# Patient Record
Sex: Male | Born: 1945
Health system: Southern US, Community
[De-identification: ages and names within clinical notes are randomized; demographics above are authoritative.]

## PROBLEM LIST (undated history)

## (undated) DIAGNOSIS — F32A Depression, unspecified: Secondary | ICD-10-CM

## (undated) DIAGNOSIS — M4800 Spinal stenosis, site unspecified: Secondary | ICD-10-CM

## (undated) DIAGNOSIS — I10 Essential (primary) hypertension: Secondary | ICD-10-CM

## (undated) DIAGNOSIS — F329 Major depressive disorder, single episode, unspecified: Secondary | ICD-10-CM

## (undated) DIAGNOSIS — R319 Hematuria, unspecified: Secondary | ICD-10-CM

## (undated) DIAGNOSIS — I2111 ST elevation (STEMI) myocardial infarction involving right coronary artery: Secondary | ICD-10-CM

## (undated) DIAGNOSIS — F419 Anxiety disorder, unspecified: Secondary | ICD-10-CM

## (undated) DIAGNOSIS — C801 Malignant (primary) neoplasm, unspecified: Secondary | ICD-10-CM

## (undated) DIAGNOSIS — K219 Gastro-esophageal reflux disease without esophagitis: Secondary | ICD-10-CM

## (undated) DIAGNOSIS — M1990 Unspecified osteoarthritis, unspecified site: Secondary | ICD-10-CM

## (undated) HISTORY — PX: SPINE SURGERY: SHX786

## (undated) HISTORY — DX: Unspecified osteoarthritis, unspecified site: M19.90

## (undated) HISTORY — DX: Essential (primary) hypertension: I10

## (undated) HISTORY — DX: Spinal stenosis, site unspecified: M48.00

## (undated) HISTORY — PX: COLONOSCOPY: SHX174

## (undated) HISTORY — PX: KNEE SURGERY: SHX244

---

## 2001-06-16 HISTORY — PX: COLONOSCOPY: SHX5424

## 2002-01-19 ENCOUNTER — Ambulatory Visit (HOSPITAL_BASED_OUTPATIENT_CLINIC_OR_DEPARTMENT_OTHER): Admission: RE | Admit: 2002-01-19 | Discharge: 2002-01-19 | Payer: Self-pay | Admitting: Urology

## 2004-04-17 ENCOUNTER — Ambulatory Visit: Payer: Self-pay | Admitting: Internal Medicine

## 2004-04-24 ENCOUNTER — Ambulatory Visit: Payer: Self-pay | Admitting: Internal Medicine

## 2005-04-15 ENCOUNTER — Inpatient Hospital Stay (HOSPITAL_COMMUNITY): Admission: RE | Admit: 2005-04-15 | Discharge: 2005-04-18 | Payer: Self-pay | Admitting: Orthopedic Surgery

## 2005-05-09 ENCOUNTER — Ambulatory Visit: Payer: Self-pay | Admitting: Internal Medicine

## 2005-05-26 ENCOUNTER — Ambulatory Visit: Payer: Self-pay | Admitting: Internal Medicine

## 2006-02-14 HISTORY — PX: ACHILLES TENDON SURGERY: SHX542

## 2006-03-10 ENCOUNTER — Ambulatory Visit: Payer: Self-pay | Admitting: Internal Medicine

## 2006-05-26 ENCOUNTER — Ambulatory Visit: Payer: Self-pay | Admitting: Internal Medicine

## 2006-05-26 LAB — CONVERTED CEMR LAB
ALT: 65 units/L — ABNORMAL HIGH (ref 0–40)
AST: 43 units/L — ABNORMAL HIGH (ref 0–37)
Albumin: 3.9 g/dL (ref 3.5–5.2)
Alkaline Phosphatase: 58 units/L (ref 39–117)
BUN: 16 mg/dL (ref 6–23)
Basophils Absolute: 0 10*3/uL (ref 0.0–0.1)
Basophils Relative: 0.4 % (ref 0.0–1.0)
CO2: 29 meq/L (ref 19–32)
Calcium: 9.7 mg/dL (ref 8.4–10.5)
Chloride: 103 meq/L (ref 96–112)
Chol/HDL Ratio, serum: 5.1
Cholesterol: 219 mg/dL (ref 0–200)
Creatinine, Ser: 1.3 mg/dL (ref 0.4–1.5)
Eosinophil percent: 4.4 % (ref 0.0–5.0)
GFR calc non Af Amer: 60 mL/min
Glomerular Filtration Rate, Af Am: 73 mL/min/{1.73_m2}
Glucose, Bld: 98 mg/dL (ref 70–99)
HCT: 53.5 % — ABNORMAL HIGH (ref 39.0–52.0)
HDL: 43.2 mg/dL (ref >39.0)
Hemoglobin: 17.8 g/dL — ABNORMAL HIGH (ref 13.0–17.0)
LDL DIRECT: 164.1 mg/dL
Lymphocytes Relative: 26.1 % (ref 12.0–46.0)
MCHC: 33.3 g/dL (ref 30.0–36.0)
MCV: 91.8 fL (ref 78.0–100.0)
Monocytes Absolute: 0.5 10*3/uL (ref 0.2–0.7)
Monocytes Relative: 9.1 % (ref 3.0–11.0)
Neutro Abs: 3 10*3/uL (ref 1.4–7.7)
Neutrophils Relative %: 60 % (ref 43.0–77.0)
PSA: 4.15 ng/mL — ABNORMAL HIGH (ref 0.10–4.00)
Platelets: 242 10*3/uL (ref 150–400)
Potassium: 4.5 meq/L (ref 3.5–5.1)
RBC: 5.83 M/uL — ABNORMAL HIGH (ref 4.22–5.81)
RDW: 12.8 % (ref 11.5–14.6)
Sodium: 139 meq/L (ref 135–145)
TSH: 2.33 microintl units/mL (ref 0.35–5.50)
Total Bilirubin: 1.5 mg/dL — ABNORMAL HIGH (ref 0.3–1.2)
Total Protein: 6.3 g/dL (ref 6.0–8.3)
Triglyceride fasting, serum: 95 mg/dL (ref 0–149)
VLDL: 19 mg/dL (ref 0–40)
WBC: 5 10*3/uL (ref 4.5–10.5)

## 2006-06-01 ENCOUNTER — Ambulatory Visit: Payer: Self-pay | Admitting: Internal Medicine

## 2007-03-16 ENCOUNTER — Encounter: Admission: RE | Admit: 2007-03-16 | Discharge: 2007-03-16 | Payer: Self-pay | Admitting: Neurosurgery

## 2007-03-31 ENCOUNTER — Ambulatory Visit (HOSPITAL_COMMUNITY): Admission: RE | Admit: 2007-03-31 | Discharge: 2007-04-01 | Payer: Self-pay | Admitting: Neurosurgery

## 2007-05-07 ENCOUNTER — Ambulatory Visit: Payer: Self-pay | Admitting: Internal Medicine

## 2007-05-07 LAB — CONVERTED CEMR LAB
ALT: 21 units/L (ref 0–53)
AST: 21 units/L (ref 0–37)
Albumin: 4 g/dL (ref 3.5–5.2)
Alkaline Phosphatase: 51 units/L (ref 39–117)
BUN: 19 mg/dL (ref 6–23)
Basophils Absolute: 0 10*3/uL (ref 0.0–0.1)
Basophils Relative: 0.3 % (ref 0.0–1.0)
Bilirubin Urine: NEGATIVE
Bilirubin, Direct: 0.2 mg/dL (ref 0.0–0.3)
Blood in Urine, dipstick: NEGATIVE
CO2: 31 meq/L (ref 19–32)
Calcium: 9.7 mg/dL (ref 8.4–10.5)
Chloride: 105 meq/L (ref 96–112)
Cholesterol: 186 mg/dL (ref 0–200)
Creatinine, Ser: 1 mg/dL (ref 0.4–1.5)
Eosinophils Absolute: 0.1 10*3/uL (ref 0.0–0.6)
Eosinophils Relative: 1.9 % (ref 0.0–5.0)
GFR calc Af Amer: 99 mL/min
GFR calc non Af Amer: 82 mL/min
Glucose, Bld: 103 mg/dL — ABNORMAL HIGH (ref 70–99)
Glucose, Urine, Semiquant: NEGATIVE
HCT: 50.4 % (ref 39.0–52.0)
HDL: 35.5 mg/dL — ABNORMAL LOW (ref >39.0)
Hemoglobin: 17.1 g/dL — ABNORMAL HIGH (ref 13.0–17.0)
Ketones, urine, test strip: NEGATIVE
LDL Cholesterol: 133 mg/dL — ABNORMAL HIGH (ref 0–99)
Lymphocytes Relative: 19.8 % (ref 12.0–46.0)
MCHC: 33.8 g/dL (ref 30.0–36.0)
MCV: 90.5 fL (ref 78.0–100.0)
Monocytes Absolute: 0.7 10*3/uL (ref 0.2–0.7)
Monocytes Relative: 10.4 % (ref 3.0–11.0)
Neutro Abs: 4.3 10*3/uL (ref 1.4–7.7)
Neutrophils Relative %: 67.6 % (ref 43.0–77.0)
Nitrite: NEGATIVE
PSA: 3.43 ng/mL (ref 0.10–4.00)
Platelets: 208 10*3/uL (ref 150–400)
Potassium: 4.2 meq/L (ref 3.5–5.1)
Protein, U semiquant: NEGATIVE
RBC: 5.57 M/uL (ref 4.22–5.81)
RDW: 12.6 % (ref 11.5–14.6)
Sodium: 142 meq/L (ref 135–145)
Specific Gravity, Urine: 1.025
TSH: 2.38 microintl units/mL (ref 0.35–5.50)
Total Bilirubin: 1.4 mg/dL — ABNORMAL HIGH (ref 0.3–1.2)
Total CHOL/HDL Ratio: 5.2
Total Protein: 6.4 g/dL (ref 6.0–8.3)
Triglycerides: 87 mg/dL (ref 0–149)
Urobilinogen, UA: 0.2
VLDL: 17 mg/dL (ref 0–40)
WBC Urine, dipstick: NEGATIVE
WBC: 6.3 10*3/uL (ref 4.5–10.5)
pH: 6

## 2007-05-21 DIAGNOSIS — I10 Essential (primary) hypertension: Secondary | ICD-10-CM

## 2007-05-21 DIAGNOSIS — M1990 Unspecified osteoarthritis, unspecified site: Secondary | ICD-10-CM | POA: Insufficient documentation

## 2007-06-11 ENCOUNTER — Ambulatory Visit: Payer: Self-pay | Admitting: Internal Medicine

## 2007-06-11 DIAGNOSIS — M549 Dorsalgia, unspecified: Secondary | ICD-10-CM | POA: Insufficient documentation

## 2007-12-07 IMAGING — CR DG LUMBAR SPINE 2-3V
1 series · 1 of 1 positions shown · non-contrast
Comparison: none

CLINICAL DATA: Lumbar stenosis

LUMBAR SPINE - 2 VIEW

[view not recorded]
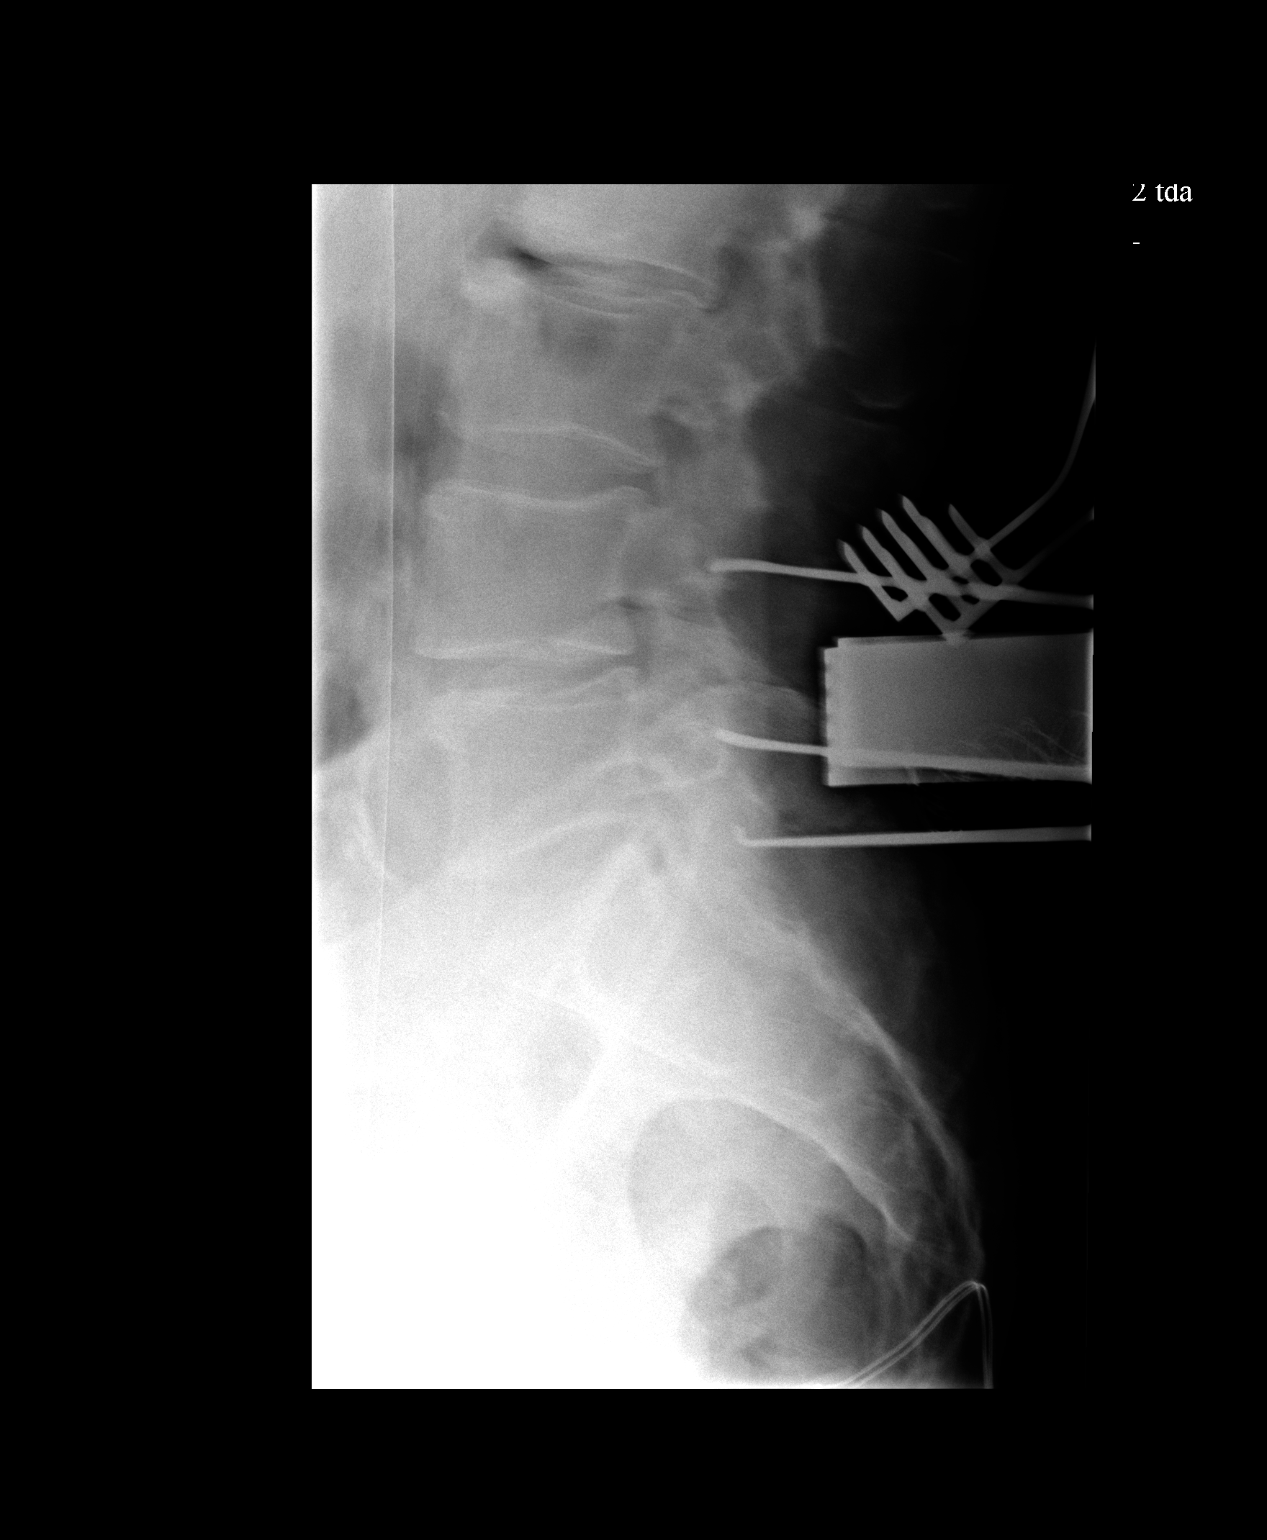

[1 of 1 positions shown; findings below may reference images not displayed]

FINDINGS: First lateral intraoperative film shows localizing needles
posteriorly at the L2-L3 and L4-L5 levels.

Second intraoperative film shows localizing instruments from the pedicles at L4
to the L5-S1 vertebral body.

IMPRESSION

Intraoperative localization as above.

## 2008-07-04 ENCOUNTER — Ambulatory Visit: Payer: Self-pay | Admitting: Internal Medicine

## 2008-07-04 LAB — CONVERTED CEMR LAB
ALT: 20 units/L (ref 0–53)
AST: 24 units/L (ref 0–37)
Albumin: 4 g/dL (ref 3.5–5.2)
Alkaline Phosphatase: 44 units/L (ref 39–117)
BUN: 21 mg/dL (ref 6–23)
Basophils Absolute: 0 10*3/uL (ref 0.0–0.1)
Basophils Relative: 0.7 % (ref 0.0–3.0)
Bilirubin Urine: NEGATIVE
Bilirubin, Direct: 0.1 mg/dL (ref 0.0–0.3)
Blood in Urine, dipstick: NEGATIVE
CO2: 31 meq/L (ref 19–32)
Calcium: 9.5 mg/dL (ref 8.4–10.5)
Chloride: 104 meq/L (ref 96–112)
Cholesterol: 204 mg/dL (ref 0–200)
Creatinine, Ser: 1.2 mg/dL (ref 0.4–1.5)
Direct LDL: 144.2 mg/dL
Eosinophils Absolute: 0.2 10*3/uL (ref 0.0–0.7)
Eosinophils Relative: 4 % (ref 0.0–5.0)
GFR calc Af Amer: 79 mL/min
GFR calc non Af Amer: 66 mL/min
Glucose, Bld: 96 mg/dL (ref 70–99)
Glucose, Urine, Semiquant: NEGATIVE
HCT: 49 % (ref 39.0–52.0)
HDL: 41 mg/dL (ref >39.0)
Hemoglobin: 17 g/dL (ref 13.0–17.0)
Lymphocytes Relative: 25.3 % (ref 12.0–46.0)
MCHC: 34.6 g/dL (ref 30.0–36.0)
MCV: 90.3 fL (ref 78.0–100.0)
Monocytes Absolute: 0.5 10*3/uL (ref 0.1–1.0)
Monocytes Relative: 9.5 % (ref 3.0–12.0)
Neutro Abs: 3.3 10*3/uL (ref 1.4–7.7)
Neutrophils Relative %: 60.5 % (ref 43.0–77.0)
Nitrite: NEGATIVE
PSA: 3.71 ng/mL (ref 0.10–4.00)
Platelets: 198 10*3/uL (ref 150–400)
Potassium: 4.1 meq/L (ref 3.5–5.1)
Protein, U semiquant: NEGATIVE
RBC: 5.43 M/uL (ref 4.22–5.81)
RDW: 12.9 % (ref 11.5–14.6)
Sodium: 142 meq/L (ref 135–145)
Specific Gravity, Urine: 1.025
TSH: 2.97 microintl units/mL (ref 0.35–5.50)
Total Bilirubin: 1.5 mg/dL — ABNORMAL HIGH (ref 0.3–1.2)
Total CHOL/HDL Ratio: 5
Total Protein: 6.7 g/dL (ref 6.0–8.3)
Triglycerides: 103 mg/dL (ref 0–149)
Urobilinogen, UA: 0.2
VLDL: 21 mg/dL (ref 0–40)
WBC Urine, dipstick: NEGATIVE
WBC: 5.3 10*3/uL (ref 4.5–10.5)
pH: 5.5

## 2008-07-10 ENCOUNTER — Encounter: Payer: Self-pay | Admitting: Internal Medicine

## 2008-07-11 ENCOUNTER — Ambulatory Visit: Payer: Self-pay | Admitting: Internal Medicine

## 2008-10-22 ENCOUNTER — Encounter: Admission: RE | Admit: 2008-10-22 | Discharge: 2008-10-22 | Payer: Self-pay | Admitting: Neurosurgery

## 2008-10-30 ENCOUNTER — Ambulatory Visit (HOSPITAL_COMMUNITY): Admission: RE | Admit: 2008-10-30 | Discharge: 2008-10-31 | Payer: Self-pay | Admitting: Neurosurgery

## 2009-01-08 ENCOUNTER — Telehealth (INDEPENDENT_AMBULATORY_CARE_PROVIDER_SITE_OTHER): Payer: Self-pay | Admitting: *Deleted

## 2009-07-12 ENCOUNTER — Ambulatory Visit: Payer: Self-pay | Admitting: Internal Medicine

## 2009-07-12 LAB — CONVERTED CEMR LAB
ALT: 20 units/L (ref 0–53)
AST: 23 units/L (ref 0–37)
Albumin: 4.1 g/dL (ref 3.5–5.2)
Alkaline Phosphatase: 49 units/L (ref 39–117)
BUN: 18 mg/dL (ref 6–23)
Basophils Absolute: 0 10*3/uL (ref 0.0–0.1)
Basophils Relative: 0.6 % (ref 0.0–3.0)
Bilirubin Urine: NEGATIVE
Bilirubin, Direct: 0.2 mg/dL (ref 0.0–0.3)
CO2: 29 meq/L (ref 19–32)
Calcium: 9.5 mg/dL (ref 8.4–10.5)
Chloride: 104 meq/L (ref 96–112)
Cholesterol: 200 mg/dL (ref 0–200)
Creatinine, Ser: 1.1 mg/dL (ref 0.4–1.5)
Eosinophils Absolute: 0.2 10*3/uL (ref 0.0–0.7)
Eosinophils Relative: 4.3 % (ref 0.0–5.0)
GFR calc non Af Amer: 72.31 mL/min (ref >60)
Glucose, Bld: 86 mg/dL (ref 70–99)
HCT: 49.9 % (ref 39.0–52.0)
HDL: 43.8 mg/dL (ref >39.00)
Hemoglobin, Urine: NEGATIVE
Hemoglobin: 16.6 g/dL (ref 13.0–17.0)
Ketones, ur: NEGATIVE mg/dL
LDL Cholesterol: 131 mg/dL — ABNORMAL HIGH (ref 0–99)
Leukocytes, UA: NEGATIVE
Lymphocytes Relative: 28.4 % (ref 12.0–46.0)
Lymphs Abs: 1.4 10*3/uL (ref 0.7–4.0)
MCHC: 33.2 g/dL (ref 30.0–36.0)
MCV: 90.6 fL (ref 78.0–100.0)
Monocytes Absolute: 0.4 10*3/uL (ref 0.1–1.0)
Monocytes Relative: 9.3 % (ref 3.0–12.0)
Neutro Abs: 2.8 10*3/uL (ref 1.4–7.7)
Neutrophils Relative %: 57.4 % (ref 43.0–77.0)
Nitrite: NEGATIVE
PSA: 4.6 ng/mL — ABNORMAL HIGH (ref 0.10–4.00)
Platelets: 236 10*3/uL (ref 150.0–400.0)
Potassium: 4.1 meq/L (ref 3.5–5.1)
RBC: 5.51 M/uL (ref 4.22–5.81)
RDW: 12.9 % (ref 11.5–14.6)
Sodium: 142 meq/L (ref 135–145)
Specific Gravity, Urine: 1.02 (ref 1.000–1.030)
TSH: 3.26 microintl units/mL (ref 0.35–5.50)
Total Bilirubin: 1.4 mg/dL — ABNORMAL HIGH (ref 0.3–1.2)
Total CHOL/HDL Ratio: 5
Total Protein, Urine: NEGATIVE mg/dL
Total Protein: 6.7 g/dL (ref 6.0–8.3)
Triglycerides: 125 mg/dL (ref 0.0–149.0)
Urine Glucose: NEGATIVE mg/dL
Urobilinogen, UA: 0.2 (ref 0.0–1.0)
VLDL: 25 mg/dL (ref 0.0–40.0)
WBC: 4.8 10*3/uL (ref 4.5–10.5)
pH: 5.5 (ref 5.0–8.0)

## 2009-07-19 ENCOUNTER — Ambulatory Visit: Payer: Self-pay | Admitting: Internal Medicine

## 2009-07-19 DIAGNOSIS — R972 Elevated prostate specific antigen [PSA]: Secondary | ICD-10-CM | POA: Insufficient documentation

## 2009-07-27 ENCOUNTER — Telehealth: Payer: Self-pay | Admitting: Internal Medicine

## 2009-10-24 ENCOUNTER — Ambulatory Visit: Payer: Self-pay | Admitting: Internal Medicine

## 2009-10-25 LAB — CONVERTED CEMR LAB: PSA: 4.2 ng/mL — ABNORMAL HIGH (ref 0.10–4.00)

## 2009-11-05 ENCOUNTER — Telehealth: Payer: Self-pay | Admitting: Internal Medicine

## 2009-11-06 ENCOUNTER — Encounter: Payer: Self-pay | Admitting: Internal Medicine

## 2009-11-20 ENCOUNTER — Encounter: Payer: Self-pay | Admitting: Internal Medicine

## 2009-11-28 ENCOUNTER — Encounter: Payer: Self-pay | Admitting: Internal Medicine

## 2010-07-16 NOTE — Assessment & Plan Note (Signed)
Summary: cpx//ccm   Vital Signs:  Patient profile:   65 year old male Weight:      218 pounds BMI:     26.63 BP sitting:   104 / 78  (left arm) Cuff size:   regular  Vitals Entered By: Raechel Ache, RN (July 19, 2009 8:44 AM) CC: CPX, labs done. Is Patient Diabetic? No   CC:  CPX and labs done.Marland Kitchen  History of Present Illness:  65 year old patient who is for a wellness exam.  He has a history of treated hypertension, and osteoarthritis.  Last spring  he required a laminectomy for a herniated disk.  He has done fairly well.  No concerns or complaints.  Review of his lab over the past few years reveals a persistent rising PSA now at 4.6.  Denies much in the way of obstructive prostate symptoms  Allergies: No Known Drug Allergies  Past History:  Past Medical History: Reviewed history from 05/21/2007 and no changes required. Hypertension Spinal stenosis Osteoarthritis  Past Surgical History: Knee surgery x2 Rupt achilles tendon 2/08 Total hip replacement left  laminectomy for spinal stenosis, October 08 HNP 10-2008 [facility])  colonoscopy 2003 ETT 2004  Family History: Reviewed history from 06/11/2007 and no changes required. both parents are  still living;mother history of diabetes, status post CABG, hypertension, DJD NO siblings  Social History: Reviewed history from 07/11/2008 and no changes required. Divorced Never Smoked Regular exercise-yes  Review of Systems  The patient denies anorexia, fever, weight loss, weight gain, vision loss, decreased hearing, hoarseness, chest pain, syncope, dyspnea on exertion, peripheral edema, prolonged cough, headaches, hemoptysis, abdominal pain, melena, hematochezia, severe indigestion/heartburn, hematuria, incontinence, genital sores, muscle weakness, suspicious skin lesions, transient blindness, difficulty walking, depression, unusual weight change, abnormal bleeding, enlarged lymph nodes, angioedema, breast masses, and  testicular masses.    Physical Exam  General:  Well-developed,well-nourished,in no acute distress; alert,appropriate and cooperative throughout examination; 120/78 Head:  Normocephalic and atraumatic without obvious abnormalities. No apparent alopecia or balding. Eyes:  No corneal or conjunctival inflammation noted. EOMI. Perrla. Funduscopic exam benign, without hemorrhages, exudates or papilledema. Vision grossly normal. Ears:  External ear exam shows no significant lesions or deformities.  Otoscopic examination reveals clear canals, tympanic membranes are intact bilaterally without bulging, retraction, inflammation or discharge. Hearing is grossly normal bilaterally. Nose:  External nasal examination shows no deformity or inflammation. Nasal mucosa are pink and moist without lesions or exudates. Mouth:  Oral mucosa and oropharynx without lesions or exudates.  Teeth in good repair. Neck:  No deformities, masses, or tenderness noted. Chest Wall:  No deformities, masses, tenderness or gynecomastia noted. Breasts:  No masses or gynecomastia noted Lungs:  Normal respiratory effort, chest expands symmetrically. Lungs are clear to auscultation, no crackles or wheezes. Heart:  Normal rate and regular rhythm. S1 and S2 normal without gallop, murmur, click, rub or other extra sounds. Abdomen:  Bowel sounds positive,abdomen soft and non-tender without masses, organomegaly or hernias noted. Rectal:  No external abnormalities noted. Normal sphincter tone. No rectal masses or tenderness. Genitalia:  Testes bilaterally descended without nodularity, tenderness or masses. No scrotal masses or lesions. No penis lesions or urethral discharge. Prostate:  2+ enlarged.  slightly prominent left lobe2+ enlarged.   Msk:  No deformity or scoliosis noted of thoracic or lumbar spine.   Pulses:  R and L carotid,radial,femoral,dorsalis pedis and posterior tibial pulses are full and equal bilaterally Extremities:  No  clubbing, cyanosis, edema, or deformity noted with normal full range  of motion of all joints.   Neurologic:  No cranial nerve deficits noted. Station and gait are normal. Plantar reflexes are down-going bilaterally. DTRs are symmetrical throughout. Sensory, motor and coordinative functions appear intact. Skin:  Intact without suspicious lesions or rashes Cervical Nodes:  No lymphadenopathy noted Axillary Nodes:  No palpable lymphadenopathy Inguinal Nodes:  No significant adenopathy Psych:  Cognition and judgment appear intact. Alert and cooperative with normal attention span and concentration. No apparent delusions, illusions, hallucinations   Impression & Recommendations:  Problem # 1:  PHYSICAL EXAMINATION (ICD-V70.0)  Problem # 2:  PROSTATE SPECIFIC ANTIGEN, ELEVATED (ICD-790.93)  Problem # 3:  OSTEOARTHRITIS (ICD-715.90)  His updated medication list for this problem includes:    Celebrex 200 Mg Caps (Celecoxib) .Marland Kitchen... 1 two times a day    His updated medication list for this problem includes:    Celebrex 200 Mg Caps (Celecoxib) .Marland Kitchen... 1 two times a day  Problem # 4:  HYPERTENSION (ICD-401.9)  His updated medication list for this problem includes:    Benazepril-hydrochlorothiazide 20-25 Mg Tabs (Benazepril-hydrochlorothiazide) .Marland Kitchen... 1 once daily    His updated medication list for this problem includes:    Benazepril-hydrochlorothiazide 20-25 Mg Tabs (Benazepril-hydrochlorothiazide) .Marland Kitchen... 1 once daily  Complete Medication List: 1)  Benazepril-hydrochlorothiazide 20-25 Mg Tabs (Benazepril-hydrochlorothiazide) .Marland Kitchen.. 1 once daily 2)  Celebrex 200 Mg Caps (Celecoxib) .Marland Kitchen.. 1 two times a day  Other Orders: TD Toxoids IM 7 YR + (16109) Admin 1st Vaccine (60454) Prescription Created Electronically [phone])  Patient Instructions: 1)  Please schedule a follow-up appointment in 1 year. 2)  Limit your Sodium (Salt). 3)  It is important that you exercise regularly at least 20 minutes  5 times a week. If you develop chest pain, have severe difficulty breathing, or feel very tired , stop exercising immediately and seek medical attention. 4)  Check your Blood Pressure regularly. If it is above:  150/90  you should make an appointment. 5)  urology follow-up as discussed Prescriptions: CELEBREX 200 MG  CAPS (CELECOXIB) 1 two times a day  #180 x 6   Entered and Authorized by:   [name]  MD   Signed by:   [name]  MD on 07/19/2009   Method used:   Print then Give to Patient   RxID:   [id number] BENAZEPRIL-HYDROCHLOROTHIAZIDE 20-25 MG  TABS (BENAZEPRIL-HYDROCHLOROTHIAZIDE) 1 once daily  #90 x 6   Entered and Authorized by:   [name]  MD   Signed by:   [name]  MD on 07/19/2009   Method used:   Print then Give to Patient   RxID:   [id number] CELEBREX 200 MG  CAPS (CELECOXIB) 1 two times a day  #180 x 6   Entered and Authorized by:   [name]  MD   Signed by:   [name]  MD on 07/19/2009   Method used:   Electronically to        [facility]* (retail)       [address]       [place], [place]  [id number]       Ph: [phone]       Fax: [phone]   RxID:   [id number] BENAZEPRIL-HYDROCHLOROTHIAZIDE 20-25 MG  TABS (BENAZEPRIL-HYDROCHLOROTHIAZIDE) 1 once daily  #90 x 6   Entered and Authorized by:   [name]  MD   Signed by:   [name]  MD on 07/19/2009   Method used:  Electronically to        [facility]* (retail)       [address]       [place], [place]  [address]       Ph: [phone]       Fax: [phone]   RxID:   [id number]    Immunizations Administered:  Tetanus Vaccine:    Vaccine Type: Td    Site: left deltoid    Mfr: Sanofi Pasteur    Dose: 0.5 ml    Route: IM    Given by: Raechel Ache, RN    Exp. Date: 04/10/2011    Lot #: [id number]    VIS given: 05/04/07 version given July 19, 2009.

## 2010-07-16 NOTE — Progress Notes (Signed)
Summary: urology referral  Phone Note Call from Patient   Caller: Patient Call For: [name]  MD Summary of Call: pt left message r/t checking on urology referral.   please advise  [phone] Initial call taken by: Duard Brady LPN,  Nov 05, 2009 3:36 PM  Follow-up for Phone Call        Patien needs Urology referral due to elevated PSA Follow-up by: [name]  MD,  Nov 05, 2009 4:59 PM  Additional Follow-up for Phone Call Additional follow up Details #1::        order sent to [facility]ri Additional Follow-up by: Duard Brady LPN,  Nov 06, 2009 9:29 AM    Additional Follow-up for Phone Call Additional follow up Details #2::    Faxed order to [facility] - they will call pt directly to set up appt.  Follow-up by: Corky Mull,  Nov 06, 2009 11:40 AM

## 2010-07-16 NOTE — Miscellaneous (Signed)
Summary: Orders Update  Clinical Lists Changes  Orders: Added new Referral order of Urology Referral (Urology) - Signed 

## 2010-07-16 NOTE — Op Note (Signed)
Summary: Prostate Ultrasound and Biopsy/[facility]   Prostate Ultrasound and Biopsy/[facility]   Imported By: Maryln Gottron 12/04/2009 14:26:22  _____________________________________________________________________  External Attachment:    Type:   Image     Comment:   External Document

## 2010-07-16 NOTE — Progress Notes (Signed)
Summary: Pt is req to get order to have PSA lab done  Phone Note Call from Patient Call back at Work Phone [phone]   Caller: Patient Summary of Call: Pt called and said that Dr. Amador Cunas said to get PSA labs done in 3 to 4 months. Pt is req that this be ordered. Pt is req to get this done on Wed May 11th,2011 at [facility].  Initial call taken by: Lucy Antigua,  July 27, 2009 10:29 AM  Follow-up for Phone Call        OK Follow-up by: [name]  MD,  July 27, 2009 2:41 PM

## 2010-07-16 NOTE — Consult Note (Signed)
Summary: [facility]  [facility]   Imported By: Maryln Gottron 12/03/2009 10:29:30  _____________________________________________________________________  External Attachment:    Type:   Image     Comment:   External Document

## 2010-07-25 ENCOUNTER — Encounter (INDEPENDENT_AMBULATORY_CARE_PROVIDER_SITE_OTHER): Payer: Self-pay | Admitting: *Deleted

## 2010-07-25 ENCOUNTER — Other Ambulatory Visit: Payer: Self-pay

## 2010-07-25 ENCOUNTER — Other Ambulatory Visit: Payer: Self-pay | Admitting: Internal Medicine

## 2010-07-25 DIAGNOSIS — Z0000 Encounter for general adult medical examination without abnormal findings: Secondary | ICD-10-CM

## 2010-07-25 DIAGNOSIS — E785 Hyperlipidemia, unspecified: Secondary | ICD-10-CM

## 2010-07-25 DIAGNOSIS — Z0389 Encounter for observation for other suspected diseases and conditions ruled out: Secondary | ICD-10-CM

## 2010-07-25 LAB — URINALYSIS
Bilirubin Urine: NEGATIVE
Ketones, ur: NEGATIVE
Leukocytes, UA: NEGATIVE
Nitrite: NEGATIVE
Specific Gravity, Urine: 1.025 (ref 1.000–1.030)
Total Protein, Urine: NEGATIVE
Urine Glucose: NEGATIVE
Urobilinogen, UA: 0.2 (ref 0.0–1.0)
pH: 5.5 (ref 5.0–8.0)

## 2010-07-25 LAB — CBC WITH DIFFERENTIAL/PLATELET
Basophils Absolute: 0 10*3/uL (ref 0.0–0.1)
Basophils Relative: 0.5 % (ref 0.0–3.0)
Eosinophils Absolute: 0.2 10*3/uL (ref 0.0–0.7)
Eosinophils Relative: 4 % (ref 0.0–5.0)
HCT: 50.5 % (ref 39.0–52.0)
Hemoglobin: 17.3 g/dL — ABNORMAL HIGH (ref 13.0–17.0)
Lymphocytes Relative: 27.6 % (ref 12.0–46.0)
Lymphs Abs: 1.5 10*3/uL (ref 0.7–4.0)
MCHC: 34.3 g/dL (ref 30.0–36.0)
MCV: 91 fl (ref 78.0–100.0)
Monocytes Absolute: 0.4 10*3/uL (ref 0.1–1.0)
Monocytes Relative: 6.6 % (ref 3.0–12.0)
Neutro Abs: 3.4 10*3/uL (ref 1.4–7.7)
Neutrophils Relative %: 61.3 % (ref 43.0–77.0)
Platelets: 221 10*3/uL (ref 150.0–400.0)
RBC: 5.55 Mil/uL (ref 4.22–5.81)
RDW: 13.4 % (ref 11.5–14.6)
WBC: 5.6 10*3/uL (ref 4.5–10.5)

## 2010-07-25 LAB — BASIC METABOLIC PANEL
BUN: 19 mg/dL (ref 6–23)
CO2: 28 mEq/L (ref 19–32)
Calcium: 9.1 mg/dL (ref 8.4–10.5)
Chloride: 100 mEq/L (ref 96–112)
Creatinine, Ser: 1 mg/dL (ref 0.4–1.5)
GFR: 76.88 mL/min (ref >60.00)
Glucose, Bld: 96 mg/dL (ref 70–99)
Potassium: 4.1 mEq/L (ref 3.5–5.1)
Sodium: 137 mEq/L (ref 135–145)

## 2010-07-25 LAB — PSA: PSA: 4.55 ng/mL — ABNORMAL HIGH (ref 0.10–4.00)

## 2010-07-25 LAB — LIPID PANEL
Cholesterol: 193 mg/dL (ref 0–200)
HDL: 32.2 mg/dL — ABNORMAL LOW (ref >39.00)
Total CHOL/HDL Ratio: 6
Triglycerides: 308 mg/dL — ABNORMAL HIGH (ref 0.0–149.0)
VLDL: 61.6 mg/dL — ABNORMAL HIGH (ref 0.0–40.0)

## 2010-07-25 LAB — HEPATIC FUNCTION PANEL
ALT: 18 U/L (ref 0–53)
AST: 22 U/L (ref 0–37)
Albumin: 3.9 g/dL (ref 3.5–5.2)
Alkaline Phosphatase: 45 U/L (ref 39–117)
Bilirubin, Direct: 0.2 mg/dL (ref 0.0–0.3)
Total Bilirubin: 0.6 mg/dL (ref 0.3–1.2)
Total Protein: 6.4 g/dL (ref 6.0–8.3)

## 2010-07-25 LAB — LDL CHOLESTEROL, DIRECT: Direct LDL: 114.7 mg/dL

## 2010-07-25 LAB — TSH: TSH: 3.21 u[IU]/mL (ref 0.35–5.50)

## 2010-08-08 ENCOUNTER — Other Ambulatory Visit: Payer: Self-pay | Admitting: Internal Medicine

## 2010-08-08 DIAGNOSIS — I10 Essential (primary) hypertension: Secondary | ICD-10-CM

## 2010-08-09 ENCOUNTER — Other Ambulatory Visit: Payer: Self-pay | Admitting: Internal Medicine

## 2010-08-11 ENCOUNTER — Encounter: Payer: Self-pay | Admitting: Internal Medicine

## 2010-08-11 ENCOUNTER — Other Ambulatory Visit: Payer: Self-pay | Admitting: Internal Medicine

## 2010-08-12 ENCOUNTER — Encounter: Payer: Self-pay | Admitting: Internal Medicine

## 2010-08-12 ENCOUNTER — Ambulatory Visit (INDEPENDENT_AMBULATORY_CARE_PROVIDER_SITE_OTHER): Payer: 59 | Admitting: Internal Medicine

## 2010-08-12 VITALS — BP 118/70 | HR 80 | Temp 98.3°F | Resp 14 | Ht 76.0 in | Wt 218.0 lb

## 2010-08-12 DIAGNOSIS — I10 Essential (primary) hypertension: Secondary | ICD-10-CM

## 2010-08-12 MED ORDER — BENAZEPRIL-HYDROCHLOROTHIAZIDE 20-25 MG PO TABS: 20.0000 | ORAL_TABLET | Freq: Every day | ORAL | Status: DC

## 2010-08-12 MED ORDER — CELECOXIB 200 MG PO CAPS: 200.0000 mg | ORAL_CAPSULE | Freq: Every day | ORAL | Status: DC

## 2010-08-12 NOTE — Progress Notes (Signed)
  Subjective:    Patient ID: Bryan [name], male    DOB: 1945/06/19, 65 y.o.   MRN: [id number]  HPI   65 year old  Patient seen today for a wellness exam. He is followed closely by urology due today slightly elevated PSA he is status post prostate biopsy last year. PSA has been stable and only leg elevated. He has treated hypertension which has done well. He has osteoarthritis and chronic back pain related to spinal stenosis.   Review of Systems  Constitutional: Negative for fever, chills, activity change, appetite change and fatigue.  HENT: Negative for hearing loss, ear pain, congestion, rhinorrhea, sneezing, mouth sores, trouble swallowing, neck pain, neck stiffness, dental problem, voice change, sinus pressure and tinnitus.   Eyes: Negative for photophobia, pain, redness and visual disturbance.  Respiratory: Negative for apnea, cough, choking, chest tightness, shortness of breath and wheezing.   Cardiovascular: Negative for chest pain, palpitations and leg swelling.  Gastrointestinal: Negative for nausea, vomiting, abdominal pain, diarrhea, constipation, blood in stool, abdominal distention, anal bleeding and rectal pain.  Genitourinary: Negative for dysuria, urgency, frequency, hematuria, flank pain, decreased urine volume, discharge, penile swelling, scrotal swelling, difficulty urinating, genital sores and testicular pain.  Musculoskeletal: Positive for back pain. Negative for myalgias, joint swelling, arthralgias and gait problem.  Skin: Negative for color change, rash and wound.  Neurological: Negative for dizziness, tremors, seizures, syncope, facial asymmetry, speech difficulty, weakness, light-headedness, numbness and headaches.  Hematological: Negative for adenopathy. Does not bruise/bleed easily.  Psychiatric/Behavioral: Negative for suicidal ideas, hallucinations, behavioral problems, confusion, sleep disturbance, self-injury, dysphoric mood, decreased concentration and agitation.  The patient is not nervous/anxious.        Objective:   Physical Exam  Constitutional: He appears well-developed and well-nourished.  HENT:  Head: Normocephalic and atraumatic.  Right Ear: External ear normal.  Left Ear: External ear normal.  Nose: Nose normal.  Mouth/Throat: Oropharynx is clear and moist.  Eyes: Conjunctivae and EOM are normal. Pupils are equal, round, and reactive to light. No scleral icterus.  Neck: Normal range of motion. Neck supple. No JVD present. No thyromegaly present.  Cardiovascular: Regular rhythm, normal heart sounds and intact distal pulses.  Exam reveals no gallop and no friction rub.   No murmur heard. Pulmonary/Chest: Effort normal and breath sounds normal. He exhibits no tenderness.  Abdominal: Soft. Bowel sounds are normal. He exhibits no distension and no mass. There is no tenderness.  Genitourinary: Prostate normal and penis normal.  Musculoskeletal: Normal range of motion. He exhibits no edema and no tenderness.  Lymphadenopathy:    He has no cervical adenopathy.  Neurological: He is alert. He has normal reflexes. No cranial nerve deficit. Coordination normal.  Skin: Skin is warm and dry. No rash noted.  Psychiatric: He has a normal mood and affect. His behavior is normal.          Assessment & Plan:   preventive health examination and  Elevated PSA  Hypertension stable   We'll continue present regimen  Urology followup

## 2010-08-12 NOTE — Patient Instructions (Signed)
Limit your sodium (Salt) intake  Please check your blood pressure on a regular basis.  If it is consistently greater than 150/90, please make an office appointment.    It is important that you exercise regularly, at least 20 minutes 3 to 4 times per week.  If you develop chest pain or shortness of breath seek  medical attention.  Return in one year for follow-up  

## 2010-08-13 ENCOUNTER — Telehealth: Payer: Self-pay

## 2010-08-13 DIAGNOSIS — I10 Essential (primary) hypertension: Secondary | ICD-10-CM

## 2010-08-13 MED ORDER — CELECOXIB 200 MG PO CAPS: 200.0000 mg | ORAL_CAPSULE | Freq: Two times a day (BID) | ORAL | Status: DC

## 2010-08-13 MED ORDER — BENAZEPRIL-HYDROCHLOROTHIAZIDE 20-25 MG PO TABS: 1.0000 | ORAL_TABLET | Freq: Every day | ORAL | Status: DC

## 2010-08-13 NOTE — Telephone Encounter (Signed)
Sig changes efill to [place]

## 2010-09-24 LAB — BASIC METABOLIC PANEL
BUN: 19 mg/dL (ref 6–23)
CO2: 29 mEq/L (ref 19–32)
Calcium: 9.7 mg/dL (ref 8.4–10.5)
Chloride: 105 mEq/L (ref 96–112)
Creatinine, Ser: 0.99 mg/dL (ref 0.4–1.5)
GFR calc Af Amer: >60 mL/min (ref >60)
GFR calc non Af Amer: >60 mL/min (ref >60)
Glucose, Bld: 122 mg/dL — ABNORMAL HIGH (ref 70–99)
Potassium: 4.5 mEq/L (ref 3.5–5.1)
Sodium: 141 mEq/L (ref 135–145)

## 2010-09-24 LAB — CBC
HCT: 51.6 % (ref 39.0–52.0)
Hemoglobin: 17.8 g/dL — ABNORMAL HIGH (ref 13.0–17.0)
MCHC: 34.6 g/dL (ref 30.0–36.0)
MCV: 89.9 fL (ref 78.0–100.0)
Platelets: 222 10*3/uL (ref 150–400)
RBC: 5.74 MIL/uL (ref 4.22–5.81)
RDW: 13.2 % (ref 11.5–15.5)
WBC: 6.3 10*3/uL (ref 4.0–10.5)

## 2010-10-29 NOTE — Op Note (Signed)
Bryan [name], Bryan [name]               ACCOUNT NO.:  [id number]   MEDICAL RECORD NO.:  [id number]          PATIENT TYPE:  AMB   LOCATION:  SDS                          FACILITY:  [facility]   PHYSICIAN:  Hewitt Shorts, M.D.DATE OF BIRTH:  1946/04/23   DATE OF PROCEDURE:  03/31/2007  DATE OF DISCHARGE:                               OPERATIVE REPORT   PREOPERATIVE DIAGNOSES:  1. Lumbar stenosis.  2. Lumbar spondylosis.  3. Lumbar degenerative disk disease.  4. Neurogenic claudication.   POSTOPERATIVE DIAGNOSES:  1. Lumbar stenosis.  2. Lumbar spondylosis.  3. Lumbar degenerative disk disease.  4. Neurogenic claudication.   PROCEDURE:  L3 to L1 decompressive lumbar laminectomy, medial  facetectomy and foraminotomy with decompression of the thecal sac and  exiting L3, L4, L5 and S1 nerve roots bilaterally with microdissection.   SURGEON:  Hewitt Shorts, M.D.   ASSISTANT:  Nelia Shi. Webb Silversmith, RN   ANESTHESIA:  General endotracheal.   INDICATION:  The patient is a 65 year old man who presented with  neurogenic claudication.  The patient was tried on extensive nonsurgical  management including NSAIDs and epidural steroid injections but  continued to have increasing worsening pain and returned requesting  surgical decompression.   PROCEDURE:  The patient was brought to the operating room, placed under  general endotracheal anesthesia.  The patient was turned to a prone  position.  Lumbar region was prepped with Betadine soaping solution,  draped in a sterile fashion.  The midline was infiltrated with local  anesthetic with epinephrine.  An x-ray was taken and the L3 to S1 level  identified.  An incision was made in the midline and carried down  through the subcutaneous tissue.  Bipolar cautery and electrocautery was  used to maintain hemostasis.  Dissection was carried down to the lumbar  fascia which was incised bilaterally and the paraspinal muscles were  dissected through the  spinous process and lamina in a subperiosteal  fashion.  A self-retaining retractor was placed and the L3, L4, L5 and  S1 spinous processes of lamina were identified.  We then began the  laminectomy using a double action rongeur and the XMax drill and  Kerrison punches.  We carefully dissected the ligamentum flavum through  the overlying lamina and carefully decompressed the spinal canal and the  thecal sac.  The stenosis was quite severe at the L4-5 level.  There was  both bony and ligamentous overgrowth and this was removed and we were  able to decompress the spinal canal.  At L3-4, there was similarly,  although less severe, both ligament and bony overgrowth and that was  likewise decompressed.  We then turned out attention to the neural  foramina and exiting nerve roots.  A medial facetectomy was performed  bilaterally, leaving the facet joints intact and then ligamentum flavum  and spondylytic overgrowth was carefully removed from the neural foramen  and we found that each of the nerve roots could exit freely  including  L3, L4, L5 and S1 bilaterally.  We did perform foraminotomies for the S1  nerve roots bilaterally where the ligamentum  flavum at the L5-S1 level  was thickened and was carefully removed.  Several small holes in the  dura were found.  One at the L4-5 level was felt to probably be related  to a previous epidural injection.  There were two small pin holes at the  L3-4 level and one slightly larger hole about 1.5 mm in diameter.  Each  of these was closed with a figure-of-eight 6-0 Prolene suture and good  water-tight closures were achieved at all four spots.  Once  decompression was completed, hemostasis was established with the use of  Gelfoam soaked in thrombin that was placed in the laminectomy defect.  Edges of the bone were waxed as needed and then the wound was irrigated  numerous times through the procedure with bacitracin solution and then  we subsequently  proceeded with closure.   It should be noted that the operating room microscope had been draped  and brought into the field to provide magnification, illumination, and  visualization for the decompression of the neural foramina as we worked  in the floor lateral space as well as for the repair of the small dural  holes.   The paraspinal muscles were approximated with interrupted, undyed 1  Vicryl sutures.  The deep fascia was closed with interrupted, undyed 1  Vicryl sutures.  Scarpa fascia closed with interrupted, undyed 1 Vicryl  sutures and the subcutaneous and subcuticular were closed with  interrupted, inverted 2-0 undyed Vicryl sutures and the skin was  reapproximated with surgical staples.  The wound was dressed with  Adaptic and sterile gauze and Hypafix.  The procedure was tolerated  well.  The estimated blood loss was 300 mL.  Sponge and needle count  were correct.   Following surgery, the patient was turned back to a supine position to  be reversed from anesthetic, extubated and transferred to the recovery  room for further care.      Hewitt Shorts, M.D.  Electronically Signed     RWN/MEDQ  D:  03/31/2007  T:  04/01/2007  Job:  [id number]   cc:   Hewitt Shorts, M.D.

## 2010-10-29 NOTE — Op Note (Signed)
Bryan [name], Bryan [name]               ACCOUNT NO.:  [id number]   MEDICAL RECORD NO.:  [id number]          PATIENT TYPE:  OIB   LOCATION:  3535                         FACILITY:  [facility]   PHYSICIAN:  Hewitt Shorts, M.D.DATE OF BIRTH:  11-Dec-1945   DATE OF PROCEDURE:  10/30/2008  DATE OF DISCHARGE:                               OPERATIVE REPORT   PREOPERATIVE DIAGNOSES:  Right L5 lumbar disk herniation, lumbar  spondylosis, lumbar degenerative disk disease, lumbar radiculopathy.   POSTOPERATIVE DIAGNOSES:  Right L5 lumbar disk herniation, lumbar  spondylosis, lumbar degenerative disk disease, lumbar radiculopathy.   PROCEDURE:  Right L5 hemilaminectomy and microdiskectomy with  microdissection.   SURGEON:  Hewitt Shorts, MD   ASSISTANT:  Nelia Shi. Webb Silversmith, NP and Stefani Dama, MD   ANESTHESIA:  General endotracheal.   INDICATION:  The patient is a 65 year old man who presents with an acute  right L5 lumbar radiculopathy with significant weakness in the right  foot including 0-1 EHL and 1-2 dorsiflexor.  MRI scan reveals a disk  herniation for the fragment behind the body of L5.  Of note, the patient  had a previous multilevel decompressive lumbar laminectomy and a  decision was made to proceed with hemilaminectomy and microdiskectomy.   DETAILS OF PROCEDURE:  The patient was brought to the operating room,  placed on general endotracheal anesthesia.  The patient was turned to a  prone position.  Lumbar region was prepped with Betadine soap and  solution and draped in a sterile fashion.  The midline was infiltrated  with local anesthetic with epinephrine and a previous midline incision  over the lumbar spine was reopened.  Dissection was carried down through  the subcutaneous tissue to the lumbar fascia, which was incised.  In the  midline, we began to dissect down through the midline scar.  We were  able to identify the spinous process at S1.  We exposed the S1 lamina,  dissected laterally to the right, began to expose the facet complexes to  the right side.  Then, we began to dissect the scar tissue along the  medial aspect of the previous laminectomy, began to expose the  dorsolateral epidural space.  An x-ray was taken to help with  localization to the L5 level.  The operating microscope was then draped  and brought into the field to provide additional magnification,  illumination, and visualization.  The remainder of the decompression was  performed using microdissection and microsurgical technique.  As we  began to perform the L5 hemi lamina, we then began to extend the  hemilaminectomy laterally using the XMax drill and Kerrison punches.  We  encountered a large synovial cyst and this was gradually freed up from  the lateral aspect of the thecal sac, as well as from the facet itself  and the great majority of the mass was removed.  The portion of the  synovial cyst adherent to lateral aspect of the thecal sac, which was  left in place because it was felt that removal would likely result in  tear of the dura, and then we identified  the exiting right L5 nerve root  and the right L5 pedicle.  We began to examine the epidural space and  began to free up the scar tissue.  We dissected down and identified the  annulus of the L5-S1 disk.  Later we dissected rostrally and identified  the annulus of the L4-L5 disk.  Both were intact and we had a difficult  time identifying the disk herniation.  Sweeping with a coronary dilator,  the ventral aspect of the epidural space was freed without an apparent  mass.  We performed a laminotomy for the exiting L4-L5 nerve root and  then in the axilla of the L5 nerve root, we saw a yellowish tissue.  We  began to dissect with a micro Freer and this thin membrane opened and  there is a thin layer of fat tissue (epidural fat) and then disk  herniation with a somewhat walled-off free fragment.  We are able to use  a hook to  normalize this fragment that was removed in a piecemeal  fashion in its entirety with good decompression in its thecal sac and  exiting right L5 nerve roots.  It was removed in its entirety.  The  neural structures were decompressed.  It was not connected to either  disk space, although it clearly had originated probably from the L5-S1  disk space.  It was irrigated with Bacitracin solution.  Once the  diskectomy was completed, hemostasis was established with the use of  bipolar cautery, as well as Gelfoam with thrombin.  The Gelfoam was  removed and the wound irrigated multiple times with bacitracin solution.  Good hemostasis was confirmed and then we instilled 2 mL of fentanyl and  80 mg of Depo-Medrol into the epidural space and proceeded with closure.  Paraspinal muscles were approximated with interrupted undyed 1 Vicryl  sutures.  Deep fascia was closed with interrupted undyed 1 Vicryl  sutures.  Scarpa's fascia was closed with interrupted undyed 1 Vicryl  sutures.  The subcutaneous and subcuticular were closed with interrupted  inverted 2-0 and 3-0 undyed Vicryl sutures.  The skin was approximated  with Dermabond.  Procedure was tolerated well.  Estimated blood loss was  35 mL.  Sponge count correct.  Following surgery, the patient was  returned back to supine position, to be reversed from the anesthetic, to  be extubated, and transferred to the recovery room for further care.      Hewitt Shorts, M.D.  Electronically Signed     RWN/MEDQ  D:  10/30/2008  T:  10/31/2008  Job:  [id number]

## 2010-11-01 NOTE — Assessment & Plan Note (Signed)
[facility]                            [facility] OFFICE NOTE   Bryan [name], Bryan [name]                      MRN:          [id number]  DATE:06/01/2006                            DOB:          04-Apr-1946    A 65 year old gentleman seen today for an annual exam.  He has  hypertension, DJD.  He has had a number of orthopedic concerns.  He has  had left knee arthroscopic surgery on 2 occasions.  More recently has  had surgery to repair a ruptured left Achilles tendon.  He has had left  total hip replacement surgery.  He has seen Dr. Vonita Moss in the past for  an episode of gross hematuria.  His hypertension is well-controlled.  He  has seen Dr. Newell Coral in the past, and apparently has severe spinal  stenosis documented on MRI.  Continues to have significant back pain.   FAMILY HISTORY:  Unchanged.   EXAMINATION:  The patient is fit, in no acute distress.  Blood pressure is 120/82.  FUNDI, EARS, NOSE, AND THROAT:  Clear.  NECK:  No bruits.  CHEST:  Clear.  CARDIOVASCULAR:  Normal heart sounds, no murmurs.  ABDOMEN:  Benign.  EXTERNAL GENITALIA:  Normal.  RECTAL:  Prostate minimally enlarged.  Stool heme negative.  EXTREMITIES:  Negative with full peripheral pulses.   IMPRESSION:  1. Hypertension.  2. Spinal stenosis.   DISPOSITION:  The patient has been asked to return in 6 months.  He  wishes to be retreated for toe onychomycosis.  He was given a  prescription for Lamisil #28 to take 7 days for 4 consecutive months.  Will recheck some LFTs and a PSA in 6 months since they are both  slightly elevated.     [name], MD  Electronically Signed    PFK/MedQ  DD: 06/01/2006  DT: 06/01/2006  Job #: [id number]

## 2010-11-01 NOTE — H&P (Signed)
NAMEPRAYAN, ULIN               ACCOUNT NO.:  [id number]   MEDICAL RECORD NO.:  [id number]          PATIENT TYPE:  INP   LOCATION:  NA                           FACILITY:  [facility]   PHYSICIAN:  Madlyn Frankel. Charlann Boxer, M.D.  DATE OF BIRTH:  02-01-1946   DATE OF ADMISSION:  04/15/2005  DATE OF DISCHARGE:                                HISTORY & PHYSICAL   PRINCIPAL DIAGNOSIS:  Left hip osteoarthritis.   SECONDARY DIAGNOSIS:  Hypertension.   HISTORY OF PRESENT ILLNESS:  Mr. Kuechle is a pleasant 65 year old gentleman  who I have been following for left hip pain. He presented with a decreased  quality of life and decreased functional capacity due to his left hip pain.  He enjoys being active and is having limitations due to his hip pain. He has  had 2 cortisone injections with the second one lasting for a very short  period of time. After this injection, he wished to discuss proceeding with  surgical intervention. He basically at this point feels that his quality of  life is affected enough that he wished to undergo total hip replacement.   He does not want any further conservative care.   PAST MEDICAL HISTORY:  Hypertension.   PAST SURGICAL HISTORY:  Knee arthroscopy x2.   CURRENT MEDICATIONS:  Uniretic and Advil as needed.   ALLERGIES:  No known drug allergies.   SOCIAL HISTORY:  He works at [employer] and [employer] in [place]. He denies any  smoking or alcohol history. He does enjoy working out and does this at [employer]. His primary care physician is Dr. Eleonore Chiquito.   FAMILY HISTORY:  Coronary disease and hypertension.   REVIEW OF SYMPTOMS:  Otherwise unremarkable for any upper respiratory,  pulmonary, cardiac, gastrointestinal or genitoureteral issues.   PHYSICAL EXAMINATION:  GENERAL:  Examination reveals a healthy and awake  individual with no acute distress.  VITAL SIGNS:  Temperature 97.8, pulse 64, blood pressure 144/96.  GENERAL:  He is awake, alert and oriented,  appropriately dressed.  HEENT:  Examination of his face and neck revealed no facial asymmetry. He  has normal speech, no slurring. Eyes move symmetrically.  NECK:  Supple with no bruits detected, no lymph nodes detected,  CHEST:  Clear to auscultation bilaterally.  HEART:  Regular rate and rhythm with no murmurs detected.  ABDOMEN:  Soft and nontender with positive bowel sounds.  EXTREMITIES:  His left hip has a painful range of motion and has been  previously noted. He is able to internally rotate about 10 degrees on the  left hip and 20 on the right. Skin is intact. Otherwise, he is  neurovascularly intact. Note that Mr. Deans is 6 feet 4, 215 pounds.   Radiographs reveal left hip osteoarthritis, labs pending.   IMPRESSION:  Left hip end-stage osteoarthritis.   PLAN:  Mr. Ottaviano will be scheduled for left total hip replacement on  April 15, 2005. The risks and benefits were reviewed and discussed in the  office today for consent obtainment. We reviewed and discussed bearing  surfaces, component failure, all risks and benefits  available. A decision  will be made for metal on metal, ASR or large head technology on Mr. Koua Deeg.      Madlyn Frankel Charlann Boxer, M.D.  Electronically Signed     MDO/MEDQ  D:  04/07/2005  T:  04/07/2005  Job:  [id number]

## 2010-11-01 NOTE — Op Note (Signed)
   TNAMEJAMERSON, VONBARGEN                       ACCOUNT NO.:  [id number]   MEDICAL RECORD NO.:  [id number]                   PATIENT TYPE:   LOCATION:                                       FACILITY:   PHYSICIAN:  Maretta Bees. Vonita Moss, M.D.             DATE OF BIRTH:   DATE OF PROCEDURE:  01/19/2002  DATE OF DISCHARGE:                                 OPERATIVE REPORT   PREOPERATIVE DIAGNOSIS:  Hematuria, rule out left renal collecting system  lesion.   POSTOPERATIVE DIAGNOSIS:  Hematuria, rule out left renal collecting system  lesion.   PROCEDURE:  Cystoscopy and bilateral retrograde pyelograms with  interpretation.   SURGEON:  Maretta Bees. Vonita Moss, M.D.   ANESTHESIA:  General.   INDICATIONS:  This 65 year old gentleman had gross hematuria earlier this  year, and a renal ultrasound showed no renal masses.  There is no  hydronephrosis or stones.  Cystoscopy revealed no stones, tumors, or other  lesions in the bladder.  I brought him back for an IVP with tonography, and  the right upper tract looked fine, but the left pyelocaliceal system never  filled out all that while, so I got some urine cystologies x 3 which were  negative and in reviewing the IVP again with Dr. Vernie Ammons, we felt that the  gross hematuria and the poor filling of the left pyelocaliceal sytem, he  needed retrograde pyelogram, so he is brought to the OR today for that  reason.   DESCRIPTION OF PROCEDURE:  The patient was brought to operating room and  placed in lithotomy position.  The external genitalia were prepped and  draped in the usual fashion.  He was cystoscoped.  The anterior urethra was  normal.  The prostatic urethra was unremarkable.  The bladder had no stones,  tumor, or inflammatory lesions.  An 8 [place] cone tip ureteral catheter was  utilized to perform a left retrograde pyelogram, and the left ureter was  totally normal, and the left pyelocaliceal system filled out quite nicely  with no filling  defects or lesions whatsoever and no hydronephrosis.  I then  decided to make sure I thought the right ureter in completion, so I did a  right retrograde pyelogram and showed very symmetrical system with no  filling defects or lesions.  The bladder was emptied and the scope removed.  The patient was sent to the recovery room in good condition.                                               Maretta Bees. Vonita Moss, M.D.    LJP/MEDQ  D:  01/19/2002  T:  01/22/2002  Job:  [id number]   cc:   [name], M.D. [facility]

## 2010-11-01 NOTE — Discharge Summary (Signed)
NAMERODD, HEFT               ACCOUNT NO.:  [id number]   MEDICAL RECORD NO.:  [id number]          PATIENT TYPE:  INP   LOCATION:  1605                         FACILITY:  [facility]   PHYSICIAN:  Madlyn Frankel. Charlann Boxer, M.D.  DATE OF BIRTH:  1945-07-20   DATE OF ADMISSION:  04/15/2005  DATE OF DISCHARGE:  04/18/2005                                 DISCHARGE SUMMARY   ADMISSION DIAGNOSES:  1.  Severe osteoarthritis of the left hip with end-stage.  2.  Hypertension.   DISCHARGE DIAGNOSES:  1.  Severe osteoarthritis of the left hip with end-stage.  2.  Hypertension.  3.  Mild postoperative anemia.   OPERATION:  On April 15, 2005 the patient underwent left total hip  replacement arthroplasty utilizing the DePuy system.  D.L. Idolina Primer, P.A.-  C. assisted.   BRIEF HISTORY:  This is a 65 year old gentleman who is quite active in life  style but had marked change concerning the pain in his left hip.  Conservative measures with anti-inflammatories and cortisone injections of  the hip has really not helped him at all.  He highly desires to continue on  with his life and after much discussion, including the risks and benefits of  surgery, including a ceramic fracture of the prosthesis, as well as the  possibility of squeaking of ceramic on mental components, he decided to go  ahead with the above procedure and was scheduled for the same.   COURSE IN THE HOSPITAL:  The patient tolerated his surgical procedure quite  well.  He did very well postoperatively.  He entered eagerly into the total  hip protocol.  The wound remained dry.  The calf was soft.  No evidence of  any DVT.  Once home arrangements were made, it decided he could be  maintained in his home environment and arrangements were made for discharge.  He was extremely pleased with the reduction in his groin pain after the  surgical procedure.   LABORATORY VALUES IN HOSPITAL:  Hematologically showed a CBC with a  differential.   Preoperatively it was completely within normal limits.  Hemoglobin was 16.8, hematocrit is 48.9.  The final hemoglobin was 12.8 with  a hematocrit of 37.2.  Blood chemistries remained essentially normal.  Urinalysis was negative for a urinary tract infection.  A chest x-ray showed  no acute cardiopulmonary abnormalities.  An 8.1 nodule within the right lung  was noted and Dr. Bradly Chris recommended repeat imaging with nipple markers.  When this done the previously demonstrated nodular density on the right was  the right nipple shadow and stable mild changes of COPD read by Dr. Zella Ball.  No electrocardiogram is seen no this chart.   CONDITION ON DISCHARGE:  Improved and stable.   PLAN:  The patient was discharged to his home in the care of his family.  Home health is arranged through [place], as well as the durable medical  goods.  Return to see Dr. Charlann Boxer two weeks after the date of surgery.  He is  at 50% weightbearing to the operative extremity and may use dry dressing  p.r.n.  We encouraged him to call should he have any problems or questions.   DISCHARGE MEDICATIONS:  1.  He was given Vicodin for pain.  2.  A muscle relaxant for any muscle spasms.  3.  He is to continue with Coumadin protocol four weeks after the date of      surgery.      Dooley L. Cherlynn June.      Madlyn Frankel Charlann Boxer, M.D.  Electronically Signed    DLU/MEDQ  D:  04/24/2005  T:  04/24/2005  Job:  [id number]

## 2010-11-01 NOTE — Op Note (Signed)
NAMEKHALEEM, BURCHILL               ACCOUNT NO.:  [id number]   MEDICAL RECORD NO.:  [id number]          PATIENT TYPE:  INP   LOCATION:  0005                         FACILITY:  [facility]   PHYSICIAN:  Madlyn Frankel. Charlann Boxer, M.D.  DATE OF BIRTH:  Sep 28, 1945   DATE OF PROCEDURE:  04/15/2005  DATE OF DISCHARGE:                                 OPERATIVE REPORT   PREOPERATIVE DIAGNOSES:  Left hip osteoarthritis.   POSTOPERATIVE DIAGNOSES:  Left hip osteoarthritis.   PROCEDURE:  Left total hip replacement.   COMPONENTS USED:  DePuy hip system with a size 54 ASR cup, an S-ROM thermal  system 22 x 17, 36 plus 12 with a 33F XXL sleeve and a 47 ball with a plus 6  taper.   SURGEON:  Madlyn Frankel. Charlann Boxer, M.D.   ASSISTANT:  Cherly Beach, P.A.-C.   ANESTHESIA:  General.   ESTIMATED BLOOD LOSS:  700.   DRAINS:  Drains x1.   COMPLICATIONS:  None.   INDICATIONS FOR PROCEDURE:  Mr. Star is a 65 year old gentleman who is  very active. He has had progressive left hip pain that is decreasing his  quality of life and decreasing his activity level. He has tried conservative  measures with antiinflammatories and cortisone injections which have failed  to provide longstanding relief. Upon failure of these conservative measures,  he wished to discuss surgical intervention. The risks and benefits were  reviewed including neurovascular injury, hip dislocation, and component  failure. We talked about baring surfaces, risks and benefits including  ceramic fracture, wear in addition to factors of dislocation. Head size,  metal on metal bearing surfaces was discussed in addition to squeaking for  ceramic and metals. Consent was obtained for ASR large ball head technology.   DESCRIPTION OF PROCEDURE:  The patient was brought to the operative theater.  Once adequate anesthesia and preoperative antibiotics were administered, 1  gram of Ancef, the patient was positioned in the right lateral decubitus  position  with the left side up. Prior to prepping and draping the patient's  anatomy, it was identified and noted in term of leg lengths. The up side  left lower extremity was symmetric to the right side with the knees parallel  and heels were noted. This was utilized for leg lengths. Following this, a  prescrub was applied and then the left lower extremity was prepped and  draped in a sterile fashion. A lateral based incision was made for a  posterior approach to the hip. Short external rotators were taken down  separately from the posterior capsule which was safe for protection against  the sciatic nerve and then later for repair. The hip was dislocated. The  patient's anatomy was noted to have a very long femoral neck both from an  offset standpoint as well as slightly above the tip of the trochanter. The  anatomy was identified, neck osteotomy made based off preoperative  templating and anatomic landmarks. Following neck osteotomy, I had to remove  some femoral head marginal osteophytes at the inferior portion of the head.  At this point, acetabular exposure was obtained in  routine fashion using  retractors. Reaming commenced first a with 45 reamer. Note the transverse  ligament remained intact and used as a landmark. A 45 reamer was carried  down to the medial wall followed by sequential reamers using mini reamers.  It had excellent exposure of the acetabulum and reamed up to a 53 with an  excellent bony bed preparation. At this point, the final 54 cup was  impacted. It was anatomically positioned with about 40 degrees of abduction,  15 degrees of forward flexion beneath the anterior rim. There was some  posterior wall osteophyte that was debrided.   Following positioning of his cup and impaction  attention was then now  directed to the femur.   Femoral preparation was carried out per protocol for the S-ROM hip system  with distal reaming to a 17. I had good bony purchase at this level. I  then  prepared proximally with a 22 reamer to a 32F. I then milled the femur to a  32F large. Note that when I put the trial sleeve in position, it seemed to  sink a little bit further beyond the neck cut. I did do a trial with this in  that position and felt that the legs were shorter on this side and for that  reason I bumped up to a 32F XXL sleeve. This was approximately 1 mm to 2 mm  proud of the neck cut. Trial reduction was carried out initially with a 22 x  17, 36 plus 12 stem based on the patient's native offset. I did trial 0  sleeves all the way up to a plus 6. Based on comparing the down leg shuck  soft tissue tension, I felt that the size 6 sleeve provided the greatest  restoration of the patient's leg length, soft tissue tension as well as  offset. This matched the preoperative templated plan. The femoral head in  this position appeared to be just above the tip of the trochanter which  matched again the patient's normal anatomy. All of these parameters lead to  choosing these as the final components.   A final 32F XXL sleeve was impacted to the level where the trial was  positioned. The 22 x 17, 36 plus 12 stem was then positioned at about 10  degrees of anteversion and impacted without difficulty. The final 47 plus 6  sleeve was then brought into the field and impacted onto a clean and dry  trunnion. The final position of the components was felt to be excellent with  about 45-50 degrees of combined anteversion. There was about 1 mm of shuck  in extension. The hip had no impingement with external rotation and  extension. He tolerated hip flexion, neutral abduction and internal rotation  to 80 degrees without complications. He tolerated adduction and internal  rotation to 80 degrees without dislocation. Leg lengths again appeared equal  to that as determined prior to prepping and draping. At this point, the wound was copiously irrigated with normal saline solution, a medium  hemovac  drain was placed. The posterior capsule leaflet was reapproximated to the  superior leaflet. The remainder of the wound was closed in layers with #1  Ethibond in the iliotibial band, gluteus maximus was run with a #1 Vicryl  and the skin closed with 4-0 Monocryl. The patient was extubated and brought  to the recovery room in stable condition.      Madlyn Frankel Charlann Boxer, M.D.  Electronically Signed     MDO/MEDQ  D:  04/15/2005  T:  04/15/2005  Job:  [id number]

## 2011-03-27 LAB — CBC
HCT: 52.1 — ABNORMAL HIGH
Hemoglobin: 17.8 — ABNORMAL HIGH
MCHC: 34.2
MCV: 89.9
Platelets: 262
RBC: 5.79
RDW: 12.9
WBC: 7.6

## 2011-03-27 LAB — BASIC METABOLIC PANEL
BUN: 17
CO2: 29
Calcium: 9.7
Chloride: 101
Creatinine, Ser: 1.08
GFR calc Af Amer: >60
GFR calc non Af Amer: >60
Glucose, Bld: 101 — ABNORMAL HIGH
Potassium: 4.3
Sodium: 139

## 2011-08-06 ENCOUNTER — Other Ambulatory Visit (INDEPENDENT_AMBULATORY_CARE_PROVIDER_SITE_OTHER): Payer: 59

## 2011-08-06 DIAGNOSIS — Z0000 Encounter for general adult medical examination without abnormal findings: Secondary | ICD-10-CM

## 2011-08-06 LAB — POCT URINALYSIS DIPSTICK
Bilirubin, UA: NEGATIVE
Blood, UA: NEGATIVE
Ketones, UA: NEGATIVE
Leukocytes, UA: NEGATIVE
Protein, UA: NEGATIVE
Spec Grav, UA: 1.02
pH, UA: 6

## 2011-08-06 LAB — CBC WITH DIFFERENTIAL/PLATELET
Basophils Absolute: 0 10*3/uL (ref 0.0–0.1)
Eosinophils Absolute: 0.2 10*3/uL (ref 0.0–0.7)
HCT: 50.8 % (ref 39.0–52.0)
Hemoglobin: 17 g/dL (ref 13.0–17.0)
Lymphocytes Relative: 25.9 % (ref 12.0–46.0)
Lymphs Abs: 1.4 10*3/uL (ref 0.7–4.0)
MCHC: 33.5 g/dL (ref 30.0–36.0)
Monocytes Relative: 8.3 % (ref 3.0–12.0)
Neutro Abs: 3.3 10*3/uL (ref 1.4–7.7)
Platelets: 216 10*3/uL (ref 150.0–400.0)
RDW: 13.9 % (ref 11.5–14.6)

## 2011-08-06 LAB — LIPID PANEL
Cholesterol: 196 mg/dL (ref 0–200)
HDL: 41.4 mg/dL (ref >39.00)
Triglycerides: 127 mg/dL (ref 0.0–149.0)
VLDL: 25.4 mg/dL (ref 0.0–40.0)

## 2011-08-06 LAB — BASIC METABOLIC PANEL
BUN: 18 mg/dL (ref 6–23)
CO2: 29 mEq/L (ref 19–32)
Calcium: 9.2 mg/dL (ref 8.4–10.5)
GFR: 78.36 mL/min (ref >60.00)
Glucose, Bld: 88 mg/dL (ref 70–99)

## 2011-08-06 LAB — TSH: TSH: 2.58 u[IU]/mL (ref 0.35–5.50)

## 2011-08-06 LAB — HEPATIC FUNCTION PANEL
AST: 21 U/L (ref 0–37)
Albumin: 4 g/dL (ref 3.5–5.2)
Total Bilirubin: 0.8 mg/dL (ref 0.3–1.2)

## 2011-08-14 ENCOUNTER — Encounter: Payer: Self-pay | Admitting: Internal Medicine

## 2011-08-14 ENCOUNTER — Ambulatory Visit (INDEPENDENT_AMBULATORY_CARE_PROVIDER_SITE_OTHER): Payer: 59 | Admitting: Internal Medicine

## 2011-08-14 DIAGNOSIS — Z0000 Encounter for general adult medical examination without abnormal findings: Secondary | ICD-10-CM

## 2011-08-14 DIAGNOSIS — I10 Essential (primary) hypertension: Secondary | ICD-10-CM

## 2011-08-14 DIAGNOSIS — M1990 Unspecified osteoarthritis, unspecified site: Secondary | ICD-10-CM

## 2011-08-14 DIAGNOSIS — R972 Elevated prostate specific antigen [PSA]: Secondary | ICD-10-CM

## 2011-08-14 MED ORDER — BENAZEPRIL-HYDROCHLOROTHIAZIDE 20-25 MG PO TABS: 1.0000 | ORAL_TABLET | Freq: Every day | ORAL | Status: DC

## 2011-08-14 MED ORDER — CELECOXIB 200 MG PO CAPS: 200.0000 mg | ORAL_CAPSULE | Freq: Two times a day (BID) | ORAL | Status: DC

## 2011-08-14 NOTE — Progress Notes (Signed)
  Subjective:    Patient ID: Bryan [name], male    DOB: Dec 16, 1945, 66 y.o.   MRN: [id number]  HPI  66 year old patient who is seen today for a wellness exam. He has a history of treated hypertension and osteoarthritis. He has recorded a number of orthopedic procedures. He is doing quite well today. Uses Celebrex infrequently and when necessary ibuprofen. He is follow this annually by urology due to an elevated PSA. He is status post prostate biopsy about 2 years ago.  Past Medical History:  Reviewed history from 05/21/2007 and no changes required.  Hypertension  Spinal stenosis  Osteoarthritis  Past Surgical History:  Knee surgery x2  Rupt achilles tendon 2/08  Total hip replacement left  laminectomy for spinal stenosis, October 08  HNP 10-2008 [facility])  colonoscopy 2003  ETT 2004  Family History:  Reviewed history from 06/11/2007 and no changes required.  both parents are still living;mother history of diabetes, status post CABG, hypertension, DJD  NO siblings  Social History:  Reviewed history from 07/11/2008 and no changes required.  Divorced  Never Smoked  Regular exercise-yes    Review of Systems  Constitutional: Negative for fever, chills, appetite change and fatigue.  HENT: Negative for hearing loss, ear pain, congestion, sore throat, trouble swallowing, neck stiffness, dental problem, voice change and tinnitus.   Eyes: Negative for pain, discharge and visual disturbance.  Respiratory: Negative for cough, chest tightness, wheezing and stridor.   Cardiovascular: Negative for chest pain, palpitations and leg swelling.  Gastrointestinal: Negative for nausea, vomiting, abdominal pain, diarrhea, constipation, blood in stool and abdominal distention.  Genitourinary: Negative for urgency, hematuria, flank pain, discharge, difficulty urinating and genital sores.  Musculoskeletal: Positive for back pain and arthralgias. Negative for myalgias, joint swelling and gait problem.    Skin: Negative for rash.  Neurological: Negative for dizziness, syncope, speech difficulty, weakness, numbness and headaches.  Hematological: Negative for adenopathy. Does not bruise/bleed easily.  Psychiatric/Behavioral: Negative for behavioral problems and dysphoric mood. The patient is not nervous/anxious.        Objective:   Physical Exam  Constitutional: He appears well-developed and well-nourished.  HENT:  Head: Normocephalic and atraumatic.  Right Ear: External ear normal.  Left Ear: External ear normal.  Nose: Nose normal.  Mouth/Throat: Oropharynx is clear and moist.  Eyes: Conjunctivae and EOM are normal. Pupils are equal, round, and reactive to light. No scleral icterus.  Neck: Normal range of motion. Neck supple. No JVD present. No thyromegaly present.  Cardiovascular: Regular rhythm, normal heart sounds and intact distal pulses.  Exam reveals no gallop and no friction rub.   No murmur heard.      The left posterior tibia pulse slightly diminished  Pulmonary/Chest: Effort normal and breath sounds normal. He exhibits no tenderness.  Abdominal: Soft. Bowel sounds are normal. He exhibits no distension and no mass. There is no tenderness.  Genitourinary: Prostate normal and penis normal.  Musculoskeletal: Normal range of motion. He exhibits no edema and no tenderness.  Lymphadenopathy:    He has no cervical adenopathy.  Neurological: He is alert. He has normal reflexes. No cranial nerve deficit. Coordination normal.  Skin: Skin is warm and dry. No rash noted.  Psychiatric: He has a normal mood and affect. His behavior is normal.          Assessment & Plan:    Preventive health examination Osteoarthritis Hypertension stable Elevated PSA stable  Follow up urology in June Recheck one year Medications refilled

## 2011-08-14 NOTE — Patient Instructions (Signed)
Limit your sodium (Salt) intake  Please check your blood pressure on a regular basis.  If it is consistently greater than 150/90, please make an office appointment.    It is important that you exercise regularly, at least 20 minutes 3 to 4 times per week.  If you develop chest pain or shortness of breath seek  medical attention.  Return in one year for follow-up  

## 2011-09-29 ENCOUNTER — Ambulatory Visit (INDEPENDENT_AMBULATORY_CARE_PROVIDER_SITE_OTHER): Payer: 59 | Admitting: Internal Medicine

## 2011-09-29 ENCOUNTER — Encounter: Payer: Self-pay | Admitting: Internal Medicine

## 2011-09-29 VITALS — BP 100/80 | HR 68 | Temp 98.0°F | Wt 218.0 lb

## 2011-09-29 DIAGNOSIS — I10 Essential (primary) hypertension: Secondary | ICD-10-CM

## 2011-09-29 DIAGNOSIS — M1990 Unspecified osteoarthritis, unspecified site: Secondary | ICD-10-CM

## 2011-09-29 DIAGNOSIS — R42 Dizziness and giddiness: Secondary | ICD-10-CM

## 2011-09-29 NOTE — Patient Instructions (Signed)
Vertigo Vertigo means you feel like you or your surroundings are moving when they are not. Vertigo can be dangerous if it occurs when you are at work, driving, or performing difficult activities.  CAUSES  Vertigo occurs when there is a conflict of signals sent to your brain from the visual and sensory systems in your body. There are many different causes of vertigo, including:  Infections, especially in the inner ear.   A bad reaction to a drug or misuse of alcohol and medicines.   Withdrawal from drugs or alcohol.   Rapidly changing positions, such as lying down or rolling over in bed.   A migraine headache.   Decreased blood flow to the brain.   Increased pressure in the brain from a head injury, infection, tumor, or bleeding.  SYMPTOMS  You may feel as though the world is spinning around or you are falling to the ground. Because your balance is upset, vertigo can cause nausea and vomiting. You may have involuntary eye movements (nystagmus). DIAGNOSIS  Vertigo is usually diagnosed by physical exam. If the cause of your vertigo is unknown, your caregiver may perform imaging tests, such as an MRI scan (magnetic resonance imaging). TREATMENT  Most cases of vertigo resolve on their own, without treatment. Depending on the cause, your caregiver may prescribe certain medicines. If your vertigo is related to body position issues, your caregiver may recommend movements or procedures to correct the problem. In rare cases, if your vertigo is caused by certain inner ear problems, you may need surgery. HOME CARE INSTRUCTIONS   Follow your caregiver's instructions.   Avoid driving.   Avoid operating heavy machinery.   Avoid performing any tasks that would be dangerous to you or others during a vertigo episode.   Tell your caregiver if you notice that certain medicines seem to be causing your vertigo. Some of the medicines used to treat vertigo episodes can actually make them worse in some  people.  SEEK IMMEDIATE MEDICAL CARE IF:   Your medicines do not relieve your vertigo or are making it worse.   You develop problems with talking, walking, weakness, or using your arms, hands, or legs.   You develop severe headaches.   Your nausea or vomiting continues or gets worse.   You develop visual changes.   A family member notices behavioral changes.   Your condition gets worse.  MAKE SURE YOU:  Understand these instructions.   Will watch your condition.   Will get help right away if you are not doing well or get worse.  Document Released: 03/12/2005 Document Revised: 05/22/2011 Document Reviewed: 12/19/2010 Exit[employer] Patient Information 2012 [employer]. 

## 2011-09-29 NOTE — Progress Notes (Signed)
  Subjective:    Patient ID: Bryan [name], male    DOB: Mar 12, 1946, 66 y.o.   MRN: [id number]  HPI  66 year old patient who was stable until approximately 2 weeks ago. While laying supine at his health club he was doing some stretching and extended his head full extension to the right. This causes some intense very brief vertigo. Over the past 2 weeks he is doing quite well but still is able to precipitate mild vertigo with the same maneuver. No other focal neurological symptoms.    Review of Systems  Constitutional: Negative for fever, chills, appetite change and fatigue.  HENT: Positive for neck pain. Negative for hearing loss, ear pain, congestion, sore throat, trouble swallowing, neck stiffness, dental problem, voice change and tinnitus.   Eyes: Negative for pain, discharge and visual disturbance.  Respiratory: Negative for cough, chest tightness, wheezing and stridor.   Cardiovascular: Negative for chest pain, palpitations and leg swelling.  Gastrointestinal: Negative for nausea, vomiting, abdominal pain, diarrhea, constipation, blood in stool and abdominal distention.  Genitourinary: Negative for urgency, hematuria, flank pain, discharge, difficulty urinating and genital sores.  Musculoskeletal: Negative for myalgias, back pain, joint swelling, arthralgias and gait problem.  Skin: Negative for rash.  Neurological: Negative for dizziness, syncope, speech difficulty, weakness, numbness and headaches.  Hematological: Negative for adenopathy. Does not bruise/bleed easily.  Psychiatric/Behavioral: Negative for behavioral problems and dysphoric mood. The patient is not nervous/anxious.        Objective:   Physical Exam  Constitutional: He is oriented to person, place, and time. He appears well-developed.  HENT:  Head: Normocephalic.  Right Ear: External ear normal.  Left Ear: External ear normal.  Eyes: Conjunctivae and EOM are normal.  Neck: Normal range of motion.    Cardiovascular: Normal rate and normal heart sounds.   Pulmonary/Chest: Breath sounds normal.  Abdominal: Bowel sounds are normal.  Musculoskeletal: Normal range of motion. He exhibits no edema and no tenderness.  Neurological: He is alert and oriented to person, place, and time. He has normal reflexes. He displays normal reflexes. No cranial nerve deficit. Coordination normal.  Psychiatric: He has a normal mood and affect. His behavior is normal.          Assessment & Plan:   Benign positional vertigo. Symptoms largely resolved. We'll continue to observe at this time. We'll call if symptoms recur

## 2011-12-23 ENCOUNTER — Telehealth: Payer: Self-pay | Admitting: Internal Medicine

## 2011-12-23 MED ORDER — MELOXICAM 15 MG PO TABS: 15.0000 mg | ORAL_TABLET | Freq: Every day | ORAL | Status: DC

## 2011-12-23 NOTE — Telephone Encounter (Signed)
Attempt to call - VM - LMTCB if questions - rx faxed to [facility]

## 2011-12-23 NOTE — Telephone Encounter (Signed)
Ok generic mobic 15 mg  #62  One daily

## 2011-12-23 NOTE — Telephone Encounter (Signed)
Please advise 

## 2011-12-23 NOTE — Telephone Encounter (Signed)
Caller: Bryan [name]/Patient; Phone Number: [phone]; Message from caller: Pt calling today 12/23/11 regarding he is supposed to take Celebrex, however it is too expensive.  He has talked to his pharmacist who advised Mobic would be cheaper version of the Celebrex.  Pt would like to have this called in to his pharmacy.  [facility] in [place] [phone].  PLEASE CALL PT BACK AT [phone] TO LET HIM KNOW THIS HAS BEEN DONE.  WOULD LIKE A 62 DAY SUPPLY WITH REFILLS.

## 2012-04-12 ENCOUNTER — Other Ambulatory Visit: Payer: Self-pay | Admitting: Internal Medicine

## 2012-05-18 ENCOUNTER — Encounter: Payer: Self-pay | Admitting: Gastroenterology

## 2012-07-27 ENCOUNTER — Telehealth: Payer: Self-pay | Admitting: Internal Medicine

## 2012-07-27 NOTE — Telephone Encounter (Signed)
Patient Information:  Caller Name: Kyl  Phone: [phone]  Patient: Bryan [name], Bryan [name]  Gender: Male  DOB: [birth year]  Age: 67 Years  PCP: Eleonore Chiquito [facility])  Office Follow Up:  Does the office need to follow up with this patient?: Yes  Instructions For The Office: Needing generic form of BP med/cost reasons  RN Note:  Please call Nikolaos after new med called in or if Dr. Kirtland Bouchard has any concerns  Symptoms  Reason For Call & Symptoms: Calling re: BP medication. Wanting to see if he can switch to generic form of Lotensin HCT 20/25 1 PO QD since price has increased. [facility] faxed over request for new AOZ:HYQMVHQION HCTZ or Losartin HCTZ. Please ask Dr. Amador Cunas if new prescription can be faxed to [facility]. He is completely out of Lotensin HCT.  Reviewed Health History In EMR: Yes  Reviewed Medications In EMR: Yes  Reviewed Allergies In EMR: Yes  Reviewed Surgeries / Procedures: Yes  Date of Onset of Symptoms: 07/26/2012  Treatments Tried: Spoke with Pharmacist and given name of generic equivalent  Treatments Tried Worked: No  Guideline(s) Used:  No Protocol Available - Information Only  Disposition Per Guideline:   Discuss with PCP and Callback by Nurse Today  Reason For Disposition Reached:   Nursing judgment  Advice Given:  Call Back If:  New symptoms develop  You become worse.

## 2012-07-29 NOTE — Telephone Encounter (Signed)
Lisinopril/hctz 20/25  #90  One daily rf6

## 2012-07-31 MED ORDER — LISINOPRIL-HYDROCHLOROTHIAZIDE 20-25 MG PO TABS: 1.0000 | ORAL_TABLET | Freq: Every day | ORAL | Status: DC

## 2012-07-31 NOTE — Telephone Encounter (Signed)
Left detailed message that new Rx for Bp med was sent to pharmacy.

## 2012-08-02 NOTE — Telephone Encounter (Signed)
Spoke to pt's wife did receive message regarding Bp med and picked up Rx.

## 2012-08-19 ENCOUNTER — Other Ambulatory Visit (INDEPENDENT_AMBULATORY_CARE_PROVIDER_SITE_OTHER): Payer: Managed Care, Other (non HMO)

## 2012-08-19 DIAGNOSIS — Z0000 Encounter for general adult medical examination without abnormal findings: Secondary | ICD-10-CM

## 2012-08-19 LAB — POCT URINALYSIS DIPSTICK
Glucose, UA: NEGATIVE
Leukocytes, UA: NEGATIVE
Nitrite, UA: NEGATIVE
Urobilinogen, UA: 0.2
pH, UA: 6

## 2012-08-19 LAB — LIPID PANEL: Total CHOL/HDL Ratio: 5

## 2012-08-19 LAB — CBC WITH DIFFERENTIAL/PLATELET
Eosinophils Absolute: 0.2 10*3/uL (ref 0.0–0.7)
Eosinophils Relative: 2.2 % (ref 0.0–5.0)
HCT: 51.5 % (ref 39.0–52.0)
Lymphs Abs: 1.2 10*3/uL (ref 0.7–4.0)
MCHC: 33.5 g/dL (ref 30.0–36.0)
MCV: 89.5 fl (ref 78.0–100.0)
Monocytes Absolute: 0.6 10*3/uL (ref 0.1–1.0)
Platelets: 231 10*3/uL (ref 150.0–400.0)
WBC: 7.6 10*3/uL (ref 4.5–10.5)

## 2012-08-19 LAB — HEPATIC FUNCTION PANEL
ALT: 23 U/L (ref 0–53)
Total Bilirubin: 1.3 mg/dL — ABNORMAL HIGH (ref 0.3–1.2)
Total Protein: 6.2 g/dL (ref 6.0–8.3)

## 2012-08-19 LAB — BASIC METABOLIC PANEL
BUN: 23 mg/dL (ref 6–23)
CO2: 30 mEq/L (ref 19–32)
Chloride: 104 mEq/L (ref 96–112)
Creatinine, Ser: 1 mg/dL (ref 0.4–1.5)
Glucose, Bld: 101 mg/dL — ABNORMAL HIGH (ref 70–99)
Potassium: 4.6 mEq/L (ref 3.5–5.1)

## 2012-08-19 LAB — PSA: PSA: 5.73 ng/mL — ABNORMAL HIGH (ref 0.10–4.00)

## 2012-08-19 LAB — TSH: TSH: 2.27 u[IU]/mL (ref 0.35–5.50)

## 2012-08-27 ENCOUNTER — Encounter: Payer: Self-pay | Admitting: Internal Medicine

## 2012-08-27 ENCOUNTER — Ambulatory Visit (INDEPENDENT_AMBULATORY_CARE_PROVIDER_SITE_OTHER): Payer: Managed Care, Other (non HMO) | Admitting: Internal Medicine

## 2012-08-27 VITALS — BP 130/86 | HR 74 | Temp 97.9°F | Resp 18 | Ht 74.5 in | Wt 218.0 lb

## 2012-08-27 DIAGNOSIS — M1990 Unspecified osteoarthritis, unspecified site: Secondary | ICD-10-CM

## 2012-08-27 DIAGNOSIS — Z0000 Encounter for general adult medical examination without abnormal findings: Secondary | ICD-10-CM

## 2012-08-27 MED ORDER — LISINOPRIL-HYDROCHLOROTHIAZIDE 20-25 MG PO TABS: 1.0000 | ORAL_TABLET | Freq: Every day | ORAL | Status: DC

## 2012-08-27 MED ORDER — MELOXICAM 15 MG PO TABS: ORAL_TABLET | ORAL | Status: DC

## 2012-08-27 NOTE — Patient Instructions (Addendum)
Limit your sodium (Salt) intake  Please check your blood pressure on a regular basis.  If it is consistently greater than 150/90, please make an office appointment.    It is important that you exercise regularly, at least 20 minutes 3 to 4 times per week.  If you develop chest pain or shortness of breath seek  medical attention.  Return in one year for follow-up  Urology followup  Schedule your colonoscopy to help detect colon cancer.

## 2012-08-27 NOTE — Progress Notes (Signed)
Patient ID: Bryan [name], male   DOB: 09-15-1945, 67 y.o.   MRN: [id number]  Subjective:    Patient ID: Bryan [name], male    DOB: Oct 05, 1945, 67 y.o.   MRN: [id number]  HPI  16 -year-old patient who is seen today for a wellness exam. He has a history of treated hypertension and osteoarthritis. He has required  a number of orthopedic procedures. He is doing quite well today. Uses Celebrex infrequently and when necessary ibuprofen. He is follow this annually by urology due to an elevated PSA. He is status post prostate biopsy about 3 years ago.  Past Medical History:  Reviewed history from 05/21/2007 and no changes required.  Hypertension  Spinal stenosis  Osteoarthritis   Past Surgical History:  Knee surgery x2  Rupt achilles tendon 2/08  Total hip replacement left  laminectomy for spinal stenosis, October 08  HNP 10-2008 [facility])  colonoscopy 2003  ETT 2004   Family History:  Reviewed history from 06/11/2007 and no changes required.  both parents are still living;mother history of diabetes, status post CABG, hypertension, DJD  NO siblings   Social History:  Reviewed history from 07/11/2008 and no changes required.  Divorced  Never Smoked  Regular exercise-yes    Review of Systems  Constitutional: Negative for fever, chills, appetite change and fatigue.  HENT: Negative for hearing loss, ear pain, congestion, sore throat, trouble swallowing, neck stiffness, dental problem, voice change and tinnitus.   Eyes: Negative for pain, discharge and visual disturbance.  Respiratory: Negative for cough, chest tightness, wheezing and stridor.   Cardiovascular: Negative for chest pain, palpitations and leg swelling.  Gastrointestinal: Negative for nausea, vomiting, abdominal pain, diarrhea, constipation, blood in stool and abdominal distention.  Genitourinary: Negative for urgency, hematuria, flank pain, discharge, difficulty urinating and genital sores.  Musculoskeletal: Positive  for back pain and arthralgias. Negative for myalgias, joint swelling and gait problem.  Skin: Negative for rash.  Neurological: Negative for dizziness, syncope, speech difficulty, weakness, numbness and headaches.  Hematological: Negative for adenopathy. Does not bruise/bleed easily.  Psychiatric/Behavioral: Negative for behavioral problems and dysphoric mood. The patient is not nervous/anxious.        Objective:   Physical Exam  Constitutional: He appears well-developed and well-nourished.  HENT:  Head: Normocephalic and atraumatic.  Right Ear: External ear normal.  Left Ear: External ear normal.  Nose: Nose normal.  Mouth/Throat: Oropharynx is clear and moist.  Eyes: Conjunctivae and EOM are normal. Pupils are equal, round, and reactive to light. No scleral icterus.  Neck: Normal range of motion. Neck supple. No JVD present. No thyromegaly present.  Cardiovascular: Regular rhythm, normal heart sounds and intact distal pulses.  Exam reveals no gallop and no friction rub.   No murmur heard. The left posterior tibia pulse slightly diminished  Pulmonary/Chest: Effort normal and breath sounds normal. He exhibits no tenderness.  Abdominal: Soft. Bowel sounds are normal. He exhibits no distension and no mass. There is no tenderness.  Genitourinary: Prostate normal and penis normal.  Musculoskeletal: Normal range of motion. He exhibits no edema and no tenderness.  Lymphadenopathy:    He has no cervical adenopathy.  Neurological: He is alert. He has normal reflexes. No cranial nerve deficit. Coordination normal.  Skin: Skin is warm and dry. No rash noted.  Psychiatric: He has a normal mood and affect. His behavior is normal.          Assessment & Plan:    Preventive health examination-needs followup colonoscopy Osteoarthritis Hypertension  stable Elevated PSA followup urology. Copy of lab dispense  Follow up urology in June Recheck one year Medications refilled

## 2012-09-06 ENCOUNTER — Encounter: Payer: Self-pay | Admitting: Gastroenterology

## 2012-09-30 ENCOUNTER — Ambulatory Visit (AMBULATORY_SURGERY_CENTER): Payer: Managed Care, Other (non HMO) | Admitting: *Deleted

## 2012-09-30 VITALS — Ht 75.5 in | Wt 215.0 lb

## 2012-09-30 DIAGNOSIS — Z1211 Encounter for screening for malignant neoplasm of colon: Secondary | ICD-10-CM

## 2012-09-30 MED ORDER — PEG-KCL-NACL-NASULF-NA ASC-C 100 G PO SOLR: ORAL | Status: DC

## 2012-10-04 ENCOUNTER — Encounter: Payer: Self-pay | Admitting: Gastroenterology

## 2012-10-15 ENCOUNTER — Encounter: Payer: Managed Care, Other (non HMO) | Admitting: Gastroenterology

## 2012-10-25 ENCOUNTER — Ambulatory Visit (AMBULATORY_SURGERY_CENTER): Payer: Managed Care, Other (non HMO) | Admitting: Gastroenterology

## 2012-10-25 ENCOUNTER — Encounter: Payer: Self-pay | Admitting: Gastroenterology

## 2012-10-25 VITALS — BP 119/78 | HR 67 | Temp 96.2°F | Resp 19 | Ht 75.0 in | Wt 215.0 lb

## 2012-10-25 DIAGNOSIS — Z1211 Encounter for screening for malignant neoplasm of colon: Secondary | ICD-10-CM

## 2012-10-25 DIAGNOSIS — K5730 Diverticulosis of large intestine without perforation or abscess without bleeding: Secondary | ICD-10-CM

## 2012-10-25 MED ORDER — SODIUM CHLORIDE 0.9 % IV SOLN: 500.0000 mL | INTRAVENOUS | Status: DC

## 2012-10-25 NOTE — Progress Notes (Signed)
Patient did not experience any of the following events: a burn prior to discharge; a fall within the facility; wrong site/side/patient/procedure/implant event; or a hospital transfer or hospital admission upon discharge from the facility. (G8907) Patient did not have preoperative order for IV antibiotic SSI prophylaxis. (G8918)  

## 2012-10-25 NOTE — Patient Instructions (Addendum)
YOU HAD AN ENDOSCOPIC PROCEDURE TODAY AT THE [facility]: Refer to the procedure report that was given to you for any specific questions about what was found during the examination.  If the procedure report does not answer your questions, please call your gastroenterologist to clarify.  If you requested that your care partner not be given the details of your procedure findings, then the procedure report has been included in a sealed envelope for you to review at your convenience later.  YOU SHOULD EXPECT: Some feelings of bloating in the abdomen. Passage of more gas than usual.  Walking can help get rid of the air that was put into your GI tract during the procedure and reduce the bloating. If you had a lower endoscopy (such as a colonoscopy or flexible sigmoidoscopy) you may notice spotting of blood in your stool or on the toilet paper. If you underwent a bowel prep for your procedure, then you may not have a normal bowel movement for a few days.  DIET: Your first meal following the procedure should be a light meal and then it is ok to progress to your normal diet.  A half-sandwich or bowl of soup is an example of a good first meal.  Heavy or fried foods are harder to digest and may make you feel nauseous or bloated.  Likewise meals heavy in dairy and vegetables can cause extra gas to form and this can also increase the bloating.  Drink plenty of fluids but you should avoid alcoholic beverages for 24 hours.  ACTIVITY: Your care partner should take you home directly after the procedure.  You should plan to take it easy, moving slowly for the rest of the day.  You can resume normal activity the day after the procedure however you should NOT DRIVE or use heavy machinery for 24 hours (because of the sedation medicines used during the test).    SYMPTOMS TO REPORT IMMEDIATELY: A gastroenterologist can be reached at any hour.  During normal business hours, 8:30 AM to 5:00 PM Monday through Friday,  call [phone].  After hours and on weekends, please call the GI answering service at [phone] who will take a message and have the physician on call contact you.   Following lower endoscopy (colonoscopy or flexible sigmoidoscopy):  Excessive amounts of blood in the stool  Significant tenderness or worsening of abdominal pains  Swelling of the abdomen that is new, acute  Fever of 100F or higher   FOLLOW UP: If any biopsies were taken you will be contacted by phone or by letter within the next 1-3 weeks.  Call your gastroenterologist if you have not heard about the biopsies in 3 weeks.  Our staff will call the home number listed on your records the next business day following your procedure to check on you and address any questions or concerns that you may have at that time regarding the information given to you following your procedure. This is a courtesy call and so if there is no answer at the home number and we have not heard from you through the emergency physician on call, we will assume that you have returned to your regular daily activities without incident.  SIGNATURES/CONFIDENTIALITY: You and/or your care partner have signed paperwork which will be entered into your electronic medical record.  These signatures attest to the fact that that the information above on your After Visit Summary has been reviewed and is understood.  Full responsibility of the confidentiality of   this discharge information lies with you and/or your care-partner.  Diverticulosis, high fiber diet-handouts given  Repeat colonoscopy in 10 years.   

## 2012-10-25 NOTE — Progress Notes (Signed)
Abdominal pressure applied by tech to help advance scope to cecum. ewm

## 2012-10-25 NOTE — Op Note (Signed)
[facility] [address] [place], [address]   COLONOSCOPY PROCEDURE REPORT  PATIENT: Bryan [name], Bryan [name]  MR#: [id number] BIRTHDATE: 03-10-1946 , 66  yrs. old GENDER: Male ENDOSCOPIST: Rachael Fee, MD PROCEDURE DATE:  10/25/2012 PROCEDURE:   Colonoscopy, screening ASA CLASS:   Class II INDICATIONS:colonoscopy 2003 with Dr.  Corinda Gubler, no polyps found. MEDICATIONS: Fentanyl 50 mcg IV, Versed 5 mg IV, and These medications were titrated to patient response per physician's verbal order  DESCRIPTION OF PROCEDURE:   After the risks benefits and alternatives of the procedure were thoroughly explained, informed consent was obtained.  A digital rectal exam revealed no abnormalities of the rectum.   The LB [id number]  endoscope was introduced through the anus and advanced to the cecum, which was identified by both the appendix and ileocecal valve. No adverse events experienced.   The quality of the prep was good.  The instrument was then slowly withdrawn as the colon was fully examined.   COLON FINDINGS: There were a few small diverticulum in left colon. The examination was otherwise normal.  Retroflexed views revealed no abnormalities. The time to cecum=5 minutes 02 seconds. Withdrawal time=7 minutes 23 seconds.  The scope was withdrawn and the procedure completed. COMPLICATIONS: There were no complications.  ENDOSCOPIC IMPRESSION: There were a few small diverticulum in left colon. The examination was otherwise normal.  No polyps or cancers.  RECOMMENDATIONS: You should continue to follow colorectal cancer screening guidelines for "routine risk" patients with a repeat colonoscopy in 10 years. There is no need for FOBT (stool) testing for at least 5 years.   eSigned:  Rachael Fee, MD 10/25/2012 9:42 AM   cc: [name], MD

## 2012-10-26 ENCOUNTER — Telehealth: Payer: Self-pay | Admitting: *Deleted

## 2012-10-26 NOTE — Telephone Encounter (Signed)
  Follow up Call-  Call back number 10/25/2012  Post procedure Call Back phone  # [phone]  Permission to leave phone message Yes     Patient questions:  Do you have a fever, pain , or abdominal swelling? no Pain Score  0 *  Have you tolerated food without any problems? yes  Have you been able to return to your normal activities? yes  Do you have any questions about your discharge instructions: Diet   no Medications  no Follow up visit  no  Do you have questions or concerns about your Care? no  Actions: * If pain score is 4 or above: No action needed, pain <4.

## 2013-01-06 ENCOUNTER — Other Ambulatory Visit (HOSPITAL_COMMUNITY): Payer: Self-pay | Admitting: Urology

## 2013-01-06 DIAGNOSIS — R972 Elevated prostate specific antigen [PSA]: Secondary | ICD-10-CM

## 2013-01-27 ENCOUNTER — Other Ambulatory Visit (HOSPITAL_COMMUNITY): Payer: Managed Care, Other (non HMO)

## 2013-01-27 ENCOUNTER — Ambulatory Visit (HOSPITAL_COMMUNITY): Payer: Managed Care, Other (non HMO)

## 2013-01-28 ENCOUNTER — Other Ambulatory Visit (HOSPITAL_COMMUNITY): Payer: Managed Care, Other (non HMO)

## 2013-04-08 ENCOUNTER — Telehealth: Payer: Self-pay | Admitting: *Deleted

## 2013-04-11 MED ORDER — CYCLOBENZAPRINE HCL 10 MG PO TABS: 10.0000 mg | ORAL_TABLET | Freq: Three times a day (TID) | ORAL | Status: DC | PRN

## 2013-04-11 NOTE — Telephone Encounter (Signed)
Ok #60  RF 5

## 2013-04-11 NOTE — Telephone Encounter (Signed)
Received fax from pharmacy for refill on Cyclobenzaprine 10 mg one po every 8 hours, okay to refill?

## 2013-05-16 ENCOUNTER — Telehealth: Payer: Self-pay | Admitting: Internal Medicine

## 2013-05-16 NOTE — Telephone Encounter (Signed)
Patient Information:  Caller Name: [place]  Phone: [phone]  Patient: Bryan [name], Bryan [name]  Gender: Male  DOB: Jul 13, 1945  Age: 67 Years  PCP: Eleonore Chiquito [facility])  Office Follow Up:  Does the office need to follow up with this patient?: No  Instructions For The Office: N/A   Symptoms  Reason For Call & Symptoms: Pt states he has cough, congestion,  cold symptoms.  Reviewed Health History In EMR: Yes  Reviewed Medications In EMR: Yes  Reviewed Allergies In EMR: Yes  Reviewed Surgeries / Procedures: Yes  Date of Onset of Symptoms: 05/06/2013  Treatments Tried: Mucinex  Treatments Tried Worked: Yes  Guideline(s) Used:  Cough  Disposition Per Guideline:   Home Care  Reason For Disposition Reached:   Cough with cold symptoms (e.g., runny nose, postnasal drip, throat clearing)  Advice Given:  Reassurance  It sounds like an uncomplicated cold that we can treat at home.  Colds are very common and may make you feel uncomfortable.  Colds are caused by viruses, and no medicine or "shot" will cure an uncomplicated cold.  Colds are usually not serious.  Here is some care advice that should help.  For a Stuffy Nose - Use Nasal Washes:  Introduction: Saline (salt water) nasal irrigation (nasal wash) is an effective and simple home remedy for treating stuffy nose and sinus congestion. The nose can be irrigated by pouring, spraying, or squirting salt water into the nose and then letting it run back out.  How it Helps: The salt water rinses out excess mucus, washes out any irritants (dust, allergens) that might be present, and moistens the nasal cavity.  How to Make Saline [facility] Water) Nasal Wash :  You can make your own saline nasal wash.  Add 1/2 tsp of table salt to 1 cup (8 oz; 240 ml) of warm water.  You should use sterile, distilled, or previously boiled water for nasal irrigation.  Medicines for a Stuffy or Runny Nose:  Most cold medicines that are available  over-the-counter (OTC) are not helpful.  Nasal Decongestants for a Very Stuffy or Runny Nose:  If you have a very stuffy nose, nasal decongestant medicines can shrink the swollen nasal mucosa and allow for easier breathing. If you have a very runny nose, these medicines can reduce the amount of drainage. They may be taken as pills by mouth or as a nasal spray.  Expected Course:   Cough up to 2-3 weeks.  Call Back If:  Difficulty breathing occurs  Cough lasts more than 3 weeks  You become worse  Patient Will Follow Care Advice:  YES

## 2013-05-19 ENCOUNTER — Telehealth: Payer: Self-pay | Admitting: Internal Medicine

## 2013-05-19 NOTE — Telephone Encounter (Signed)
Patient Information:  Caller Name: [place]  Phone: [phone]  Patient: Bryan [name], Bryan [name]  Gender: Male  DOB: Apr 30, 1946  Age: 67 Years  PCP: Eleonore Chiquito (Family Practice > 62yrs old)  Office Follow Up:  Does the office need to follow up with this patient?: No  Instructions For The Office: N/A  RN Note:  Patient scheduled 10;30 tomorrow 05/20/13 with Dr. Clent Ridges.  Care advice and call back parameters reviewed. Understanding expressed.  Symptoms  Reason For Call & Symptoms: Patient states he has not been feeling well for two weeks.  Onset of cough non productive, occasional runny nose-white. +body aches. Tired from coughing.  Worse with laying down. Sleeps sitting up to decrease cough.  Home treatment not working. Asking for cough medicaiton.  Reviewed Health History In EMR: Yes  Reviewed Medications In EMR: Yes  Reviewed Allergies In EMR: Yes  Reviewed Surgeries / Procedures: Yes  Date of Onset of Symptoms: 05/05/2013  Treatments Tried: Robitussin , cough drops, honey , mucinex  Treatments Tried Worked: No  Guideline(s) Used:  Cough  Disposition Per Guideline:   See Today or Tomorrow in Office  Reason For Disposition Reached:   Continuous (nonstop) coughing interferes with work or school and no improvement using cough treatment per Care Advice  Advice Given:  Reassurance  Coughing is the way that our lungs remove irritants and mucus. It helps protect our lungs from getting pneumonia.  You can get a dry hacking cough after a chest cold. Sometimes this type of cough can last 1-3 weeks, and be worse at night.  Cough Medicines:  OTC Cough Syrups: The most common cough suppressant in OTC cough medications is dextromethorphan. Often the letters "DM" appear in the name.  OTC Cough Drops: Cough drops can help a lot, especially for mild coughs. They reduce coughing by soothing your irritated throat and removing that tickle sensation in the back of the throat. Cough drops also have  the advantage of portability - you can carry them with you.  Home Remedy - Hard Candy: Hard candy works just as well as medicine-flavored OTC cough drops. Diabetics should use sugar-free candy.  Home Remedy - Honey: This old home remedy has been shown to help decrease coughing at night. The adult dosage is 2 teaspoons (10 ml) at bedtime. Honey should not be given to infants under one year of age.  Call Back If:  Difficulty breathing  You become worse.  Prevent Dehydration:  Drink adequate liquids.  This will help soothe an irritated or dry throat and loosen up the phlegm.  Coughing Spasms:  Drink warm fluids. Inhale warm mist (Reason: both relax the airway and loosen up the phlegm).  Suck on cough drops or hard candy to coat the irritated throat.  Patient Will Follow Care Advice:  YES  Appointment Scheduled:  05/20/2013 10:30:00 Appointment Scheduled Provider:  Gershon Crane [facility])

## 2013-05-20 ENCOUNTER — Ambulatory Visit (INDEPENDENT_AMBULATORY_CARE_PROVIDER_SITE_OTHER)
Admission: RE | Admit: 2013-05-20 | Discharge: 2013-05-20 | Disposition: A | Discharge status: Home / Self Care | Payer: No Typology Code available for payment source | Source: Ambulatory Visit | Attending: Family Medicine | Admitting: Family Medicine

## 2013-05-20 ENCOUNTER — Ambulatory Visit (INDEPENDENT_AMBULATORY_CARE_PROVIDER_SITE_OTHER): Payer: No Typology Code available for payment source | Admitting: Family Medicine

## 2013-05-20 ENCOUNTER — Encounter: Payer: Self-pay | Admitting: Family Medicine

## 2013-05-20 VITALS — BP 130/64 | HR 106 | Temp 98.4°F | Wt 208.0 lb

## 2013-05-20 DIAGNOSIS — J189 Pneumonia, unspecified organism: Secondary | ICD-10-CM

## 2013-05-20 MED ORDER — CEFTRIAXONE SODIUM 1 G IJ SOLR: 1.0000 g | Freq: Once | INTRAMUSCULAR | Status: DC

## 2013-05-20 MED ORDER — HYDROCODONE-HOMATROPINE 5-1.5 MG/5ML PO SYRP: 5.0000 mL | ORAL_SOLUTION | ORAL | Status: DC | PRN

## 2013-05-20 MED ORDER — CEFTRIAXONE SODIUM 1 G IJ SOLR
1.0000 g | Freq: Once | INTRAMUSCULAR | Status: AC
  Administered 2013-05-20: 1 g via INTRAMUSCULAR

## 2013-05-20 MED ORDER — AMOXICILLIN-POT CLAVULANATE 875-125 MG PO TABS
1.0000 | ORAL_TABLET | Freq: Two times a day (BID) | ORAL | Status: DC
Start: 2013-05-20 — End: 2013-06-24

## 2013-05-20 NOTE — Progress Notes (Signed)
   Subjective:    Patient ID: Bryan [name], male    DOB: 16-Jan-1946, 67 y.o.   MRN: [id number]  HPI Here for 2 weeks of chest tightness and coughing up yellow sputum. He tolerated this well with Mucinex and Robitussin until he rapidly got worse 3 days ago. Now his cough is deeper and the sputum is dark. No chest pains. He has had night sweats. Drinking fluids.    Review of Systems  Constitutional: Negative for fever and diaphoresis.  HENT: Negative.   Eyes: Negative.   Respiratory: Positive for cough, chest tightness, shortness of breath and wheezing.   Cardiovascular: Negative.        Objective:   Physical Exam  Constitutional: He appears well-developed and well-nourished. No distress.  HENT:  Right Ear: External ear normal.  Left Ear: External ear normal.  Nose: Nose normal.  Mouth/Throat: Oropharynx is clear and moist.  Eyes: Conjunctivae are normal.  Pulmonary/Chest: Effort normal.  Scattered rhonchi and wheezes throughout, there are rales in the right posterior base   Lymphadenopathy:    He has no cervical adenopathy.          Assessment & Plan:  RLL pneumonia. Given a shot of Rocephin. Start on Augmentin. Get a CXR today

## 2013-05-20 NOTE — Addendum Note (Signed)
Addended by: Aniceto Boss A on: 05/20/2013 12:30 PM   Modules accepted: Orders

## 2013-05-20 NOTE — Progress Notes (Signed)
Pre visit review using our clinic review tool, if applicable. No additional management support is needed unless otherwise documented below in the visit note. 

## 2013-05-24 ENCOUNTER — Encounter: Payer: Self-pay | Admitting: *Deleted

## 2013-05-25 ENCOUNTER — Encounter: Payer: Self-pay | Admitting: Internal Medicine

## 2013-05-25 ENCOUNTER — Ambulatory Visit (INDEPENDENT_AMBULATORY_CARE_PROVIDER_SITE_OTHER): Payer: No Typology Code available for payment source | Admitting: Internal Medicine

## 2013-05-25 VITALS — BP 132/84 | HR 101 | Temp 97.5°F | Resp 20 | Wt 206.0 lb

## 2013-05-25 DIAGNOSIS — I10 Essential (primary) hypertension: Secondary | ICD-10-CM

## 2013-05-25 DIAGNOSIS — M1990 Unspecified osteoarthritis, unspecified site: Secondary | ICD-10-CM

## 2013-05-25 DIAGNOSIS — J189 Pneumonia, unspecified organism: Secondary | ICD-10-CM

## 2013-05-25 MED ORDER — AZITHROMYCIN 250 MG PO TABS: ORAL_TABLET | ORAL | Status: DC

## 2013-05-25 NOTE — Progress Notes (Signed)
Pre-visit discussion using our clinic review tool. No additional management support is needed unless otherwise documented below in the visit note.  

## 2013-05-25 NOTE — Progress Notes (Signed)
Subjective:    Patient ID: Bryan [name], male    DOB: 1945-10-09, 67 y.o.   MRN: [id number]  HPI  67 year old patient who is seen today for followup of community-acquired pneumonia. He was seen 5 days ago and placed on Augmentin. He seemed to tolerate this medication well. He still has mildly productive cough of clear sputum. He has occasional shortness of breath. No fever or chills. Chest x-ray reviewed. He has treated hypertension which has been stable.  Past Medical History  Diagnosis Date  . Hypertension   . Spinal stenosis   . Osteoarthritis     History   Social History  . Marital Status: Married    Spouse Name: N/A    Number of Children: N/A  . Years of Education: N/A   Occupational History  . Not on file.   Social History Main Topics  . Smoking status: Never Smoker   . Smokeless tobacco: Never Used  . Alcohol Use: No  . Drug Use: No  . Sexual Activity: Not on file   Other Topics Concern  . Not on file   Social History Narrative   Divorced, Exercises regularly    Past Surgical History  Procedure Laterality Date  . Knee surgery      X2  . Achilles tendon surgery  02/2006  . Total hip arthroplasty    . Spine surgery  12-08, 5-10  . Colonoscopy  2003  . Colonoscopy      Family History  Problem Relation Age of Onset  . Diabetes Mother   . Coronary artery disease Mother   . Hypertension Mother   . Colon polyps Neg Hx   . Esophageal cancer Neg Hx   . Rectal cancer Neg Hx   . Stomach cancer Neg Hx     Allergies  Allergen Reactions  . Codeine     "made me sick"    Current Outpatient Prescriptions on File Prior to Visit  Medication Sig Dispense Refill  . amoxicillin-clavulanate (AUGMENTIN) 875-125 MG per tablet Take 1 tablet by mouth 2 (two) times daily.  20 tablet  0  . Cholecalciferol (VITAMIN D3) 2000 UNITS TABS Take by mouth daily.      . cyclobenzaprine (FLEXERIL) 10 MG tablet Take 1 tablet (10 mg total) by mouth 3 (three) times daily as  needed for muscle spasms.  60 tablet  5  . HYDROcodone-homatropine (HYDROMET) 5-1.5 MG/5ML syrup Take 5 mLs by mouth every 4 (four) hours as needed for cough.  240 mL  0  . lisinopril-hydrochlorothiazide (PRINZIDE,ZESTORETIC) 20-25 MG per tablet Take 1 tablet by mouth daily.  90 tablet  6  . meloxicam (MOBIC) 15 MG tablet TAKE 1 TABLET ONCE DAILY.  90 tablet  6  . Misc Natural Products (OSTEO BI-FLEX ADV DOUBLE ST PO) Take by mouth daily.      . Multiple Vitamins-Minerals (MULTIVITAMIN PO) Take by mouth daily.       No current facility-administered medications on file prior to visit.    BP 132/84  Pulse 101  Temp(Src) 97.5 F (36.4 C) (Oral)  Resp 20  Wt 206 lb (93.441 kg)  SpO2 92%        Review of Systems  Constitutional: Positive for diaphoresis, activity change, appetite change and fatigue. Negative for fever and chills.  HENT: Negative for congestion, dental problem, ear pain, hearing loss, sore throat, tinnitus, trouble swallowing and voice change.   Eyes: Negative for pain, discharge and visual disturbance.  Respiratory: Positive for cough  and shortness of breath. Negative for chest tightness, wheezing and stridor.   Cardiovascular: Negative for chest pain, palpitations and leg swelling.  Gastrointestinal: Negative for nausea, vomiting, abdominal pain, diarrhea, constipation, blood in stool and abdominal distention.  Genitourinary: Negative for urgency, hematuria, flank pain, discharge, difficulty urinating and genital sores.  Musculoskeletal: Negative for arthralgias, back pain, gait problem, joint swelling, myalgias and neck stiffness.  Skin: Negative for rash.  Neurological: Negative for dizziness, syncope, speech difficulty, weakness, numbness and headaches.  Hematological: Negative for adenopathy. Does not bruise/bleed easily.  Psychiatric/Behavioral: Negative for behavioral problems and dysphoric mood. The patient is not nervous/anxious.        Objective:    Physical Exam  Constitutional: He is oriented to person, place, and time. He appears well-developed.  HENT:  Head: Normocephalic.  Right Ear: External ear normal.  Left Ear: External ear normal.  Eyes: Conjunctivae and EOM are normal.  Neck: Normal range of motion.  Cardiovascular: Normal rate and normal heart sounds.   Pulmonary/Chest: Effort normal. No respiratory distress. He has no wheezes. He has rales.  Scattered rales rhonchi at both bases O2 saturation 92%  Abdominal: Bowel sounds are normal.  Musculoskeletal: Normal range of motion. He exhibits no edema and no tenderness.  Neurological: He is alert and oriented to person, place, and time.  Psychiatric: He has a normal mood and affect. His behavior is normal.          Assessment & Plan:  Community acquired pneumonia. Clinical improvement. Will add azithromycin to complete Augmentin Forced fluids

## 2013-05-25 NOTE — Patient Instructions (Signed)
Take your antibiotic as prescribed until ALL of it is gone, but stop if you develop a rash, swelling, or any side effects of the medication.  Contact our office as soon as possible if  there are side effects of the medication.  Call or return to clinic prn if these symptoms worsen or fail to improve as anticipated.  Pneumonia, Adult Pneumonia is an infection of the lungs.  CAUSES Pneumonia may be caused by bacteria or a virus. Usually, these infections are caused by breathing infectious particles into the lungs (respiratory tract). SYMPTOMS   Cough.  Fever.  Chest pain.  Increased rate of breathing.  Wheezing.  Mucus production. DIAGNOSIS  If you have the common symptoms of pneumonia, your caregiver will typically confirm the diagnosis with a chest X-ray. The X-ray will show an abnormality in the lung (pulmonary infiltrate) if you have pneumonia. Other tests of your blood, urine, or sputum may be done to find the specific cause of your pneumonia. Your caregiver may also do tests (blood gases or pulse oximetry) to see how well your lungs are working. TREATMENT  Some forms of pneumonia may be spread to other people when you cough or sneeze. You may be asked to wear a mask before and during your exam. Pneumonia that is caused by bacteria is treated with antibiotic medicine. Pneumonia that is caused by the influenza virus may be treated with an antiviral medicine. Most other viral infections must run their course. These infections will not respond to antibiotics.  PREVENTION A pneumococcal shot (vaccine) is available to prevent a common bacterial cause of pneumonia. This is usually suggested for:  People over 56 years old.  Patients on chemotherapy.  People with chronic lung problems, such as bronchitis or emphysema.  People with immune system problems. If you are over 65 or have a high risk condition, you may receive the pneumococcal vaccine if you have not received it before. In some  countries, a routine influenza vaccine is also recommended. This vaccine can help prevent some cases of pneumonia.You may be offered the influenza vaccine as part of your care. If you smoke, it is time to quit. You may receive instructions on how to stop smoking. Your caregiver can provide medicines and counseling to help you quit. HOME CARE INSTRUCTIONS   Cough suppressants may be used if you are losing too much rest. However, coughing protects you by clearing your lungs. You should avoid using cough suppressants if you can.  Your caregiver may have prescribed medicine if he or she thinks your pneumonia is caused by a bacteria or influenza. Finish your medicine even if you start to feel better.  Your caregiver may also prescribe an expectorant. This loosens the mucus to be coughed up.  Only take over-the-counter or prescription medicines for pain, discomfort, or fever as directed by your caregiver.  Do not smoke. Smoking is a common cause of bronchitis and can contribute to pneumonia. If you are a smoker and continue to smoke, your cough may last several weeks after your pneumonia has cleared.  A cold steam vaporizer or humidifier in your room or home may help loosen mucus.  Coughing is often worse at night. Sleeping in a semi-upright position in a recliner or using a couple pillows under your head will help with this.  Get rest as you feel it is needed. Your body will usually let you know when you need to rest. SEEK IMMEDIATE MEDICAL CARE IF:   Your illness becomes worse. This  is especially true if you are elderly or weakened from any other disease.  You cannot control your cough with suppressants and are losing sleep.  You begin coughing up blood.  You develop pain which is getting worse or is uncontrolled with medicines.  You have a fever.  Any of the symptoms which initially brought you in for treatment are getting worse rather than better.  You develop shortness of breath or  chest pain. MAKE SURE YOU:   Understand these instructions.  Will watch your condition.  Will get help right away if you are not doing well or get worse. Document Released: 06/02/2005 Document Revised: 08/25/2011 Document Reviewed: 08/22/2010 [facility] Patient Information 2014 [place], [place].

## 2013-06-23 ENCOUNTER — Telehealth: Payer: Self-pay | Admitting: Internal Medicine

## 2013-06-23 NOTE — Telephone Encounter (Signed)
Patient Information:  Caller Name: [name]  Phone: [phone]  Patient: Bryan, [name]  Gender: Male  DOB: 07/25/1945  Age: 68 Years  PCP: Bluford Kaufmann (Family Practice > 28yrs old)  Office Follow Up:  Does the office need to follow up with this patient?: No  Instructions For The Office: N/A   Symptoms  Reason For Call & Symptoms: Pt had bilateral LL pneumonia.  Still has a lingering cough x 5 weeks  and asking how will it last?  Reviewed Health History In EMR: Yes  Reviewed Medications In EMR: Yes  Reviewed Allergies In EMR: Yes  Reviewed Surgeries / Procedures: Yes  Date of Onset of Symptoms: 05/20/2013  Treatments Tried: Antibiotics  Treatments Tried Worked: No  Guideline(s) Used:  Cough  Disposition Per Guideline:   See Within 3 Days in Office  Reason For Disposition Reached:   Cough has been present for > 10 days  Advice Given:  N/A  Patient Will Follow Care Advice:  YES  Appointment Scheduled:  06/24/2013 14:30:00 Appointment Scheduled Provider:  Bluford Kaufmann (Family Practice > 85yrs old)

## 2013-06-24 ENCOUNTER — Ambulatory Visit (INDEPENDENT_AMBULATORY_CARE_PROVIDER_SITE_OTHER): Payer: No Typology Code available for payment source | Admitting: Internal Medicine

## 2013-06-24 ENCOUNTER — Encounter: Payer: Self-pay | Admitting: Internal Medicine

## 2013-06-24 VITALS — BP 134/86 | HR 73 | Temp 98.1°F | Resp 20 | Wt 213.0 lb

## 2013-06-24 DIAGNOSIS — I10 Essential (primary) hypertension: Secondary | ICD-10-CM

## 2013-06-24 NOTE — Progress Notes (Signed)
Pre-visit discussion using our clinic review tool. No additional management support is needed unless otherwise documented below in the visit note.  

## 2013-06-24 NOTE — Patient Instructions (Signed)
Limit your sodium (Salt) intake  Please check your blood pressure on a regular basis.  If it is consistently greater than 150/90, please make an office appointment.  Return in 6 months for follow-up   

## 2013-06-24 NOTE — Progress Notes (Signed)
Subjective:    Patient ID: Bryan [name], male    DOB: 10-Oct-1945, 68 y.o.   MRN: [id number]  HPI  68 year old patient who has treated hypertension. He was treated about 5 weeks ago for a [facility] pneumonia. He still has a very mild persistent cough that is improving nicely. He generally feels well. Also level today is 97 up from 92%. Chest x-ray reviewed and did reveal bilateral infiltrates at both bases. Lifelong nonsmoker  Past Medical History  Diagnosis Date  . Hypertension   . Spinal stenosis   . Osteoarthritis     History   Social History  . Marital Status: Married    Spouse Name: N/A    Number of Children: N/A  . Years of Education: N/A   Occupational History  . Not on file.   Social History Main Topics  . Smoking status: Never Smoker   . Smokeless tobacco: Never Used  . Alcohol Use: No  . Drug Use: No  . Sexual Activity: Not on file   Other Topics Concern  . Not on file   Social History Narrative   Divorced, Exercises regularly    Past Surgical History  Procedure Laterality Date  . Knee surgery      X2  . Achilles tendon surgery  02/2006  . Total hip arthroplasty    . Spine surgery  12-08, 5-10  . Colonoscopy  2003  . Colonoscopy      Family History  Problem Relation Age of Onset  . Diabetes Mother   . Coronary artery disease Mother   . Hypertension Mother   . Colon polyps Neg Hx   . Esophageal cancer Neg Hx   . Rectal cancer Neg Hx   . Stomach cancer Neg Hx     Allergies  Allergen Reactions  . Codeine     "made me sick"    Current Outpatient Prescriptions on File Prior to Visit  Medication Sig Dispense Refill  . Cholecalciferol (VITAMIN D3) 2000 UNITS TABS Take by mouth daily.      . cyclobenzaprine (FLEXERIL) 10 MG tablet Take 1 tablet (10 mg total) by mouth 3 (three) times daily as needed for muscle spasms.  60 tablet  5  . lisinopril-hydrochlorothiazide (PRINZIDE,ZESTORETIC) 20-25 MG per tablet Take 1 tablet by mouth daily.  90  tablet  6  . meloxicam (MOBIC) 15 MG tablet TAKE 1 TABLET ONCE DAILY.  90 tablet  6  . Misc Natural Products (OSTEO BI-FLEX ADV DOUBLE ST PO) Take by mouth daily.      . Multiple Vitamins-Minerals (MULTIVITAMIN PO) Take by mouth daily.       No current facility-administered medications on file prior to visit.    BP 134/86  Pulse 73  Temp(Src) 98.1 F (36.7 C) (Oral)  Resp 20  Wt 213 lb (96.616 kg)  SpO2 97%       Review of Systems  Constitutional: Negative for fever, chills, appetite change and fatigue.  HENT: Negative for congestion, dental problem, ear pain, hearing loss, sore throat, tinnitus, trouble swallowing and voice change.   Eyes: Negative for pain, discharge and visual disturbance.  Respiratory: Positive for cough. Negative for chest tightness, wheezing and stridor.   Cardiovascular: Negative for chest pain, palpitations and leg swelling.  Gastrointestinal: Negative for nausea, vomiting, abdominal pain, diarrhea, constipation, blood in stool and abdominal distention.  Genitourinary: Negative for urgency, hematuria, flank pain, discharge, difficulty urinating and genital sores.  Musculoskeletal: Negative for arthralgias, back pain, gait problem, joint  swelling, myalgias and neck stiffness.  Skin: Negative for rash.  Neurological: Negative for dizziness, syncope, speech difficulty, weakness, numbness and headaches.  Hematological: Negative for adenopathy. Does not bruise/bleed easily.  Psychiatric/Behavioral: Negative for behavioral problems and dysphoric mood. The patient is not nervous/anxious.        Objective:   Physical Exam  Constitutional: He is oriented to person, place, and time. He appears well-developed.  HENT:  Head: Normocephalic.  Right Ear: External ear normal.  Left Ear: External ear normal.  Eyes: Conjunctivae and EOM are normal.  Neck: Normal range of motion.  Cardiovascular: Normal rate and normal heart sounds.   Pulmonary/Chest: Effort  normal and breath sounds normal.  Rare faint wheezing  Abdominal: Bowel sounds are normal.  Musculoskeletal: Normal range of motion. He exhibits no edema and no tenderness.  Neurological: He is alert and oriented to person, place, and time.  Psychiatric: He has a normal mood and affect. His behavior is normal.          Assessment & Plan:   Hypertension controlled Status post require pneumonia community acquired pneumonia

## 2013-09-19 ENCOUNTER — Other Ambulatory Visit: Payer: Self-pay | Admitting: Internal Medicine

## 2013-09-23 ENCOUNTER — Other Ambulatory Visit: Payer: Self-pay | Admitting: Internal Medicine

## 2013-11-11 ENCOUNTER — Encounter: Payer: Self-pay | Admitting: Family Medicine

## 2013-11-11 ENCOUNTER — Ambulatory Visit (INDEPENDENT_AMBULATORY_CARE_PROVIDER_SITE_OTHER): Payer: No Typology Code available for payment source | Admitting: Family Medicine

## 2013-11-11 ENCOUNTER — Telehealth: Payer: Self-pay | Admitting: Internal Medicine

## 2013-11-11 VITALS — BP 121/74 | HR 84 | Temp 98.6°F | Ht 75.0 in | Wt 213.0 lb

## 2013-11-11 DIAGNOSIS — K645 Perianal venous thrombosis: Secondary | ICD-10-CM

## 2013-11-11 MED ORDER — HYDROCORTISONE ACE-PRAMOXINE 2.5-1 % RE CREA: 1.0000 "application " | TOPICAL_CREAM | Freq: Two times a day (BID) | RECTAL | Status: DC

## 2013-11-11 MED ORDER — HYDROCORTISONE ACE-PRAMOXINE 1-1 % RE CREA: 1.0000 "application " | TOPICAL_CREAM | Freq: Two times a day (BID) | RECTAL | Status: DC

## 2013-11-11 NOTE — Telephone Encounter (Signed)
Patient Information:  Caller Name: [place]  Phone: [phone]  Patient: Bryan [name], Bryan [name]  Gender: Male  DOB: 03-12-1946  Age: 68 Years  PCP: Bluford Kaufmann (Family Practice > 41yrs old)  Office Follow Up:  Does the office need to follow up with this patient?: No  Instructions For The Office: N/A   Symptoms  Reason For Call & Symptoms: Pt has rectal itching . He has been using Preparation H gel and medicated wipes but symptoms are severe.  Reviewed Health History In EMR: Yes  Reviewed Medications In EMR: Yes  Reviewed Allergies In EMR: Yes  Reviewed Surgeries / Procedures: Yes  Date of Onset of Symptoms: 11/10/2013  Guideline(s) Used:  Rectal Symptoms  Disposition Per Guideline:   See Today in Office  Reason For Disposition Reached:   Moderate-Severe rectal itching (i.e., interferes with school, work, or sleep)  Advice Given:  N/A  Patient Will Follow Care Advice:  YES  Appointment Scheduled:  11/11/2013 11:00:00 Appointment Scheduled Provider:  Alysia Penna [facility])

## 2013-11-11 NOTE — Telephone Encounter (Signed)
[facility] is calling in regards to pt rx pramoxine-hydrocortisone [facility]) 1-1 % rectal cream, need verification on the strength, states the strength that was written only comes in a brand name, pharmacy need to know if its ok to use 2.5 % instead. Pt was seen by dr. Sarajane Jews today.

## 2013-11-11 NOTE — Telephone Encounter (Signed)
I sent new script e-scribe.

## 2013-11-11 NOTE — Telephone Encounter (Signed)
Okay per Dr. Fry.  

## 2013-11-11 NOTE — Progress Notes (Signed)
   Subjective:    Patient ID: Bryan [name], male    DOB: [birth year], 68 y.o.   MRN: [id number]  HPI Here for one week of itching, burning and pain at the anus. He has a hx of hemorrhoids. No bleeding. Using Tucks pads and Prep. H.    Review of Systems  Constitutional: Negative.   Gastrointestinal: Positive for rectal pain. Negative for blood in stool and anal bleeding.       Objective:   Physical Exam  Constitutional: He appears well-developed and well-nourished.  Genitourinary:  Single tender thrombosed external hemorrhoid          Assessment & Plan:  The hemorrhoid was lanced with a scalpel and the thrombus was removed. He will use Analpram HC cream prn

## 2013-11-11 NOTE — Progress Notes (Signed)
Pre visit review using our clinic review tool, if applicable. No additional management support is needed unless otherwise documented below in the visit note. 

## 2014-03-06 ENCOUNTER — Telehealth: Payer: Self-pay | Admitting: Internal Medicine

## 2014-03-06 NOTE — Telephone Encounter (Signed)
Pt had pneumonia last dec and want to know if dr Raliegh Ip ants him to have a pneumonia vaccine w/ a flu shot?

## 2014-03-07 NOTE — Telephone Encounter (Signed)
Spoke to pt, told him he is due for pneumonia shot, can get it when he comes in for flu shot. Pt verbalized understanding and asked to schedule. Pt transferred to scheduling.

## 2014-03-21 ENCOUNTER — Ambulatory Visit (INDEPENDENT_AMBULATORY_CARE_PROVIDER_SITE_OTHER): Payer: No Typology Code available for payment source | Admitting: *Deleted

## 2014-03-21 DIAGNOSIS — Z23 Encounter for immunization: Secondary | ICD-10-CM

## 2014-03-21 NOTE — Addendum Note (Signed)
Addended by: Marian Sorrow on: 03/21/2014 03:49 PM   Modules accepted: Orders

## 2014-06-29 ENCOUNTER — Other Ambulatory Visit: Payer: Self-pay | Admitting: Internal Medicine

## 2014-07-21 ENCOUNTER — Other Ambulatory Visit: Payer: Self-pay | Admitting: Internal Medicine

## 2014-09-18 ENCOUNTER — Other Ambulatory Visit: Payer: Self-pay | Admitting: Internal Medicine

## 2014-10-20 ENCOUNTER — Encounter: Payer: Self-pay | Admitting: Internal Medicine

## 2014-10-20 ENCOUNTER — Ambulatory Visit (INDEPENDENT_AMBULATORY_CARE_PROVIDER_SITE_OTHER): Payer: Medicare Other | Admitting: Internal Medicine

## 2014-10-20 VITALS — BP 120/80 | HR 68 | Temp 98.2°F | Resp 20 | Ht 74.75 in | Wt 214.0 lb

## 2014-10-20 DIAGNOSIS — I10 Essential (primary) hypertension: Secondary | ICD-10-CM

## 2014-10-20 DIAGNOSIS — Z0000 Encounter for general adult medical examination without abnormal findings: Secondary | ICD-10-CM

## 2014-10-20 DIAGNOSIS — M150 Primary generalized (osteo)arthritis: Secondary | ICD-10-CM

## 2014-10-20 DIAGNOSIS — M159 Polyosteoarthritis, unspecified: Secondary | ICD-10-CM

## 2014-10-20 DIAGNOSIS — E785 Hyperlipidemia, unspecified: Secondary | ICD-10-CM | POA: Diagnosis not present

## 2014-10-20 LAB — COMPREHENSIVE METABOLIC PANEL
ALT: 21 U/L (ref 0–53)
AST: 26 U/L (ref 0–37)
Albumin: 4.3 g/dL (ref 3.5–5.2)
Alkaline Phosphatase: 48 U/L (ref 39–117)
BUN: 21 mg/dL (ref 6–23)
CHLORIDE: 99 meq/L (ref 96–112)
CO2: 31 mEq/L (ref 19–32)
CREATININE: 1.04 mg/dL (ref 0.40–1.50)
Calcium: 10.3 mg/dL (ref 8.4–10.5)
GFR: 75.86 mL/min (ref >60.00)
GLUCOSE: 100 mg/dL — AB (ref 70–99)
Potassium: 4.3 mEq/L (ref 3.5–5.1)
Sodium: 137 mEq/L (ref 135–145)
Total Bilirubin: 1.3 mg/dL — ABNORMAL HIGH (ref 0.2–1.2)
Total Protein: 7.3 g/dL (ref 6.0–8.3)

## 2014-10-20 LAB — CBC WITH DIFFERENTIAL/PLATELET
BASOS ABS: 0 10*3/uL (ref 0.0–0.1)
Basophils Relative: 0.6 % (ref 0.0–3.0)
EOS ABS: 0.2 10*3/uL (ref 0.0–0.7)
Eosinophils Relative: 2.7 % (ref 0.0–5.0)
HCT: 53.5 % — ABNORMAL HIGH (ref 39.0–52.0)
LYMPHS PCT: 21 % (ref 12.0–46.0)
Lymphs Abs: 1.2 10*3/uL (ref 0.7–4.0)
MCHC: 34.3 g/dL (ref 30.0–36.0)
MCV: 88.5 fl (ref 78.0–100.0)
MONOS PCT: 8.1 % (ref 3.0–12.0)
Monocytes Absolute: 0.5 10*3/uL (ref 0.1–1.0)
NEUTROS ABS: 3.9 10*3/uL (ref 1.4–7.7)
NEUTROS PCT: 67.6 % (ref 43.0–77.0)
PLATELETS: 246 10*3/uL (ref 150.0–400.0)
RBC: 6.05 Mil/uL — ABNORMAL HIGH (ref 4.22–5.81)
RDW: 13.9 % (ref 11.5–15.5)
WBC: 5.8 10*3/uL (ref 4.0–10.5)

## 2014-10-20 LAB — LIPID PANEL
CHOLESTEROL: 205 mg/dL — AB (ref 0–200)
HDL: 49.2 mg/dL (ref >39.00)
LDL Cholesterol: 125 mg/dL — ABNORMAL HIGH (ref 0–99)
NONHDL: 155.8
Total CHOL/HDL Ratio: 4
Triglycerides: 156 mg/dL — ABNORMAL HIGH (ref 0.0–149.0)
VLDL: 31.2 mg/dL (ref 0.0–40.0)

## 2014-10-20 LAB — TSH: TSH: 2.19 u[IU]/mL (ref 0.35–4.50)

## 2014-10-20 MED ORDER — SILDENAFIL CITRATE 100 MG PO TABS
50.0000 mg | ORAL_TABLET | Freq: Every day | ORAL | Status: DC | PRN
Start: 2014-10-20 — End: 2017-01-20

## 2014-10-20 NOTE — Patient Instructions (Signed)
It is important that you exercise regularly, at least 20 minutes 3 to 4 times per week.  If you develop chest pain or shortness of breath seek  medical attention.  Please check your blood pressure on a regular basis.  If it is consistently greater than 150/90, please make an office appointment.  Return in one year for follow-up  Health Maintenance A healthy lifestyle and preventative care can promote health and wellness.  Maintain regular health, dental, and eye exams.  Eat a healthy diet. Foods like vegetables, fruits, whole grains, low-fat dairy products, and lean protein foods contain the nutrients you need and are low in calories. Decrease your intake of foods high in solid fats, added sugars, and salt. Get information about a proper diet from your health care provider, if necessary.  Regular physical exercise is one of the most important things you can do for your health. Most adults should get at least 150 minutes of moderate-intensity exercise (any activity that increases your heart rate and causes you to sweat) each week. In addition, most adults need muscle-strengthening exercises on 2 or more days a week.   Maintain a healthy weight. The body mass index (BMI) is a screening tool to identify possible weight problems. It provides an estimate of body fat based on height and weight. Your health care provider can find your BMI and can help you achieve or maintain a healthy weight. For males 20 years and older:  A BMI below 18.5 is considered underweight.  A BMI of 18.5 to 24.9 is normal.  A BMI of 25 to 29.9 is considered overweight.  A BMI of 30 and above is considered obese.  Maintain normal blood lipids and cholesterol by exercising and minimizing your intake of saturated fat. Eat a balanced diet with plenty of fruits and vegetables. Blood tests for lipids and cholesterol should begin at age 25 and be repeated every 5 years. If your lipid or cholesterol levels are high, you are  over age 28, or you are at high risk for heart disease, you may need your cholesterol levels checked more frequently.Ongoing high lipid and cholesterol levels should be treated with medicines if diet and exercise are not working.  If you smoke, find out from your health care provider how to quit. If you do not use tobacco, do not start.  Lung cancer screening is recommended for adults aged 5-80 years who are at high risk for developing lung cancer because of a history of smoking. A yearly low-dose CT scan of the lungs is recommended for people who have at least a 30-pack-year history of smoking and are current smokers or have quit within the past 15 years. A pack year of smoking is smoking an average of 1 pack of cigarettes a day for 1 year (for example, a 30-pack-year history of smoking could mean smoking 1 pack a day for 30 years or 2 packs a day for 15 years). Yearly screening should continue until the smoker has stopped smoking for at least 15 years. Yearly screening should be stopped for people who develop a health problem that would prevent them from having lung cancer treatment.  If you choose to drink alcohol, do not have more than 2 drinks per day. One drink is considered to be 12 oz (360 mL) of beer, 5 oz (150 mL) of wine, or 1.5 oz (45 mL) of liquor.  Avoid the use of street drugs. Do not share needles with anyone. Ask for help if you need  support or instructions about stopping the use of drugs.  High blood pressure causes heart disease and increases the risk of stroke. Blood pressure should be checked at least every 1-2 years. Ongoing high blood pressure should be treated with medicines if weight loss and exercise are not effective.  If you are 75-43 years old, ask your health care provider if you should take aspirin to prevent heart disease.  Diabetes screening involves taking a blood sample to check your fasting blood sugar level. This should be done once every 3 years after age 58 if  you are at a normal weight and without risk factors for diabetes. Testing should be considered at a younger age or be carried out more frequently if you are overweight and have at least 1 risk factor for diabetes.  Colorectal cancer can be detected and often prevented. Most routine colorectal cancer screening begins at the age of 39 and continues through age 65. However, your health care provider may recommend screening at an earlier age if you have risk factors for colon cancer. On a yearly basis, your health care provider may provide home test kits to check for hidden blood in the stool. A small camera at the end of a tube may be used to directly examine the colon (sigmoidoscopy or colonoscopy) to detect the earliest forms of colorectal cancer. Talk to your health care provider about this at age 32 when routine screening begins. A direct exam of the colon should be repeated every 5-10 years through age 26, unless early forms of precancerous polyps or small growths are found.  People who are at an increased risk for hepatitis B should be screened for this virus. You are considered at high risk for hepatitis B if:  You were born in a country where hepatitis B occurs often. Talk with your health care provider about which countries are considered high risk.  Your parents were born in a high-risk country and you have not received a shot to protect against hepatitis B (hepatitis B vaccine).  You have HIV or AIDS.  You use needles to inject street drugs.  You live with, or have sex with, someone who has hepatitis B.  You are a man who has sex with other men (MSM).  You get hemodialysis treatment.  You take certain medicines for conditions like cancer, organ transplantation, and autoimmune conditions.  Hepatitis C blood testing is recommended for all people born from [birth year] through 1963 and any individual with known risk factors for hepatitis C.  Healthy men should no longer receive prostate-specific  antigen (PSA) blood tests as part of routine cancer screening. Talk to your health care provider about prostate cancer screening.  Testicular cancer screening is not recommended for adolescents or adult males who have no symptoms. Screening includes self-exam, a health care provider exam, and other screening tests. Consult with your health care provider about any symptoms you have or any concerns you have about testicular cancer.  Practice safe sex. Use condoms and avoid high-risk sexual practices to reduce the spread of sexually transmitted infections (STIs).  You should be screened for STIs, including gonorrhea and chlamydia if:  You are sexually active and are younger than 24 years.  You are older than 24 years, and your health care provider tells you that you are at risk for this type of infection.  Your sexual activity has changed since you were last screened, and you are at an increased risk for chlamydia or gonorrhea. Ask your health care  provider if you are at risk.  If you are at risk of being infected with HIV, it is recommended that you take a prescription medicine daily to prevent HIV infection. This is called pre-exposure prophylaxis (PrEP). You are considered at risk if:  You are a man who has sex with other men (MSM).  You are a heterosexual man who is sexually active with multiple partners.  You take drugs by injection.  You are sexually active with a partner who has HIV.  Talk with your health care provider about whether you are at high risk of being infected with HIV. If you choose to begin PrEP, you should first be tested for HIV. You should then be tested every 3 months for as long as you are taking PrEP.  Use sunscreen. Apply sunscreen liberally and repeatedly throughout the day. You should seek shade when your shadow is shorter than you. Protect yourself by wearing long sleeves, pants, a wide-brimmed hat, and sunglasses year round whenever you are outdoors.  Tell  your health care provider of new moles or changes in moles, especially if there is a change in shape or color. Also, tell your health care provider if a mole is larger than the size of a pencil eraser.  A one-time screening for abdominal aortic aneurysm (AAA) and surgical repair of large AAAs by ultrasound is recommended for men aged 69-75 years who are current or former smokers.  Stay current with your vaccines (immunizations). Document Released: 11/29/2007 Document Revised: 06/07/2013 Document Reviewed: 10/28/2010 [facility] Patient Information 2015 [place], [place]. This information is not intended to replace advice given to you by your health care provider. Make sure you discuss any questions you have with your health care provider.

## 2014-10-20 NOTE — Progress Notes (Signed)
Pre visit review using our clinic review tool, if applicable. No additional management support is needed unless otherwise documented below in the visit note. 

## 2014-10-20 NOTE — Progress Notes (Signed)
Subjective:    Patient ID: Bryan [name], male    DOB: Jun 07, 1946, 69 y.o.   MRN: [id number]  HPI  Patient ID: Bryan [name], male   DOB: April 23, 1946, 69 y.o.   MRN: [id number]  Subjective:    Patient ID: Bryan [name], male    DOB: 1945-09-01, 69 y.o.   MRN: [id number]  HPI  66 -year-old patient who is seen today for a wellness exam.  He has a history of treated hypertension and osteoarthritis. He has required  a number of orthopedic procedures. He is doing quite well today except for a osteoarthritic complaints.  Back pain is his predominant concern.  Both elderly parents reside with him and he requires considerable bending and lifting.  He is playing golf infrequently due to low back pain. He is status post prostate biopsy about 5 years ago.Marland Kitchen  He is followed by urology twice annually  Past Medical History:   Hypertension  Spinal stenosis  Osteoarthritis   Past Surgical History:  Knee surgery x2  Rupt achilles tendon 2/08  Total hip replacement left  laminectomy for spinal stenosis, October 08  HNP 10-2008 [facility])  colonoscopy 2003 2014 ETT 2004   Family History:   both parents are still living;mother history of diabetes, status post CABG, hypertension, DJD  NO siblings   Social History:   Divorced  Never Smoked  Regular exercise-yes   1. Risk factors, based on past  M,S,F history.  No cardiovascular risk factors except hypertension   2.  Physical activities: Remains quite active physically with occasional golf and daily swimming and biking  3.  Depression/mood: No history depression or mood disorder  4.  Hearing: No deficits  5.  ADL's: Independent  6.  Fall risk: Low  7.  Home safety: No problems identified  8.  Height weight, and visual acuity; height and weight stable no change in visual acuity  9.  Counseling: Heart healthy diet regular exercise.  All encouraged  10. Lab orders based on risk factors: Laboratory profile reviewed   11.  Referral : Follow-up urology.  Annual eye examination encouraged  12. Care plan: Continue aggressive blood pressure control and risk factor modification  13. Cognitive assessment: Alert and oriented with normal affect.  No cognitive dysfunction  14. Screening: We'll continue annual preventive health examinations with screening lab.  He will be seen by urology at least annually with PSA determinations.  He will be maintained on 10 year colonoscopy surveillance.  Annual eye examinations recommended  15. Provider List Update: Primary care urology, ophthalmology and orthopedics   Review of Systems  Constitutional: Negative for fever, chills, appetite change and fatigue.  HENT: Negative for hearing loss, ear pain, congestion, sore throat, trouble swallowing, neck stiffness, dental problem, voice change and tinnitus.   Eyes: Negative for pain, discharge and visual disturbance.  Respiratory: Negative for cough, chest tightness, wheezing and stridor.   Cardiovascular: Negative for chest pain, palpitations and leg swelling.  Gastrointestinal: Negative for nausea, vomiting, abdominal pain, diarrhea, constipation, blood in stool and abdominal distention.  Genitourinary: Negative for urgency, hematuria, flank pain, discharge, difficulty urinating and genital sores.  Musculoskeletal: Positive for back pain and arthralgias. Negative for myalgias, joint swelling and gait problem.  Skin: Negative for rash.  Neurological: Negative for dizziness, syncope, speech difficulty, weakness, numbness and headaches.  Hematological: Negative for adenopathy. Does not bruise/bleed easily.  Psychiatric/Behavioral: Negative for behavioral problems and dysphoric mood. The patient is not nervous/anxious.  Objective:   Physical Exam  Constitutional: He appears well-developed and well-nourished.  HENT:  Head: Normocephalic and atraumatic.  Right Ear: External ear normal.  Left Ear: External ear normal.  Nose:  Nose normal.  Mouth/Throat: Oropharynx is clear and moist.  Eyes: Conjunctivae and EOM are normal. Pupils are equal, round, and reactive to light. No scleral icterus.  Neck: Normal range of motion. Neck supple. No JVD present. No thyromegaly present.  Cardiovascular: Regular rhythm, normal heart sounds and intact distal pulses.  Exam reveals no gallop and no friction rub.   No murmur heard. pedal pulses full  Pulmonary/Chest: Effort normal and breath sounds normal. He exhibits no tenderness.  Abdominal: Soft. Bowel sounds are normal. He exhibits no distension and no mass. There is no tenderness.  Genitourinary: Prostate normal and penis normal.  Musculoskeletal: Normal range of motion. He exhibits no edema and no tenderness.  Lymphadenopathy:    He has no cervical adenopathy.  Neurological: He is alert. He has normal reflexes. No cranial nerve deficit. Coordination normal.  Skin: Skin is warm and dry. No rash noted.  Psychiatric: He has a normal mood and affect. His behavior is normal.          Assessment & Plan:    Preventive health examination-needs followup colonoscopy in 8 years  Osteoarthritis Hypertension stable Elevated PSA followup urology. Copy of lab dispense  Follow up urology in June Recheck one year Medications refilled  Review of Systems  As above     Objective:   Physical Exam  As above       Assessment & Plan:   .  As above

## 2014-10-23 ENCOUNTER — Telehealth: Payer: Self-pay | Admitting: Internal Medicine

## 2014-10-23 ENCOUNTER — Other Ambulatory Visit: Payer: Self-pay | Admitting: Internal Medicine

## 2014-10-23 ENCOUNTER — Telehealth: Payer: Self-pay | Admitting: *Deleted

## 2014-10-23 DIAGNOSIS — D582 Other hemoglobinopathies: Secondary | ICD-10-CM

## 2014-10-23 NOTE — Telephone Encounter (Signed)
Patient requesting better understanding of lab results. Called and spoke to patient and patient verbalized understanding of possible causes of  increase Hgb and Cholesterol levels (drinking more water, quit smoking if smokes, and heart healthy diet). Reminded MD Burnice Logan plans to see him again in 49months.

## 2014-10-23 NOTE — Telephone Encounter (Signed)
error 

## 2015-03-15 ENCOUNTER — Other Ambulatory Visit: Payer: Self-pay | Admitting: Internal Medicine

## 2015-03-26 ENCOUNTER — Other Ambulatory Visit (INDEPENDENT_AMBULATORY_CARE_PROVIDER_SITE_OTHER): Payer: Medicare Other

## 2015-03-26 ENCOUNTER — Ambulatory Visit (INDEPENDENT_AMBULATORY_CARE_PROVIDER_SITE_OTHER): Payer: Medicare Other | Admitting: *Deleted

## 2015-03-26 DIAGNOSIS — D582 Other hemoglobinopathies: Secondary | ICD-10-CM | POA: Diagnosis not present

## 2015-03-26 DIAGNOSIS — Z23 Encounter for immunization: Secondary | ICD-10-CM

## 2015-03-26 LAB — CBC WITH DIFFERENTIAL/PLATELET
BASOS ABS: 0 10*3/uL (ref 0.0–0.1)
Basophils Relative: 0.5 % (ref 0.0–3.0)
Eosinophils Absolute: 0.3 10*3/uL (ref 0.0–0.7)
Eosinophils Relative: 4.2 % (ref 0.0–5.0)
HCT: 51.9 % (ref 39.0–52.0)
Hemoglobin: 17.6 g/dL — ABNORMAL HIGH (ref 13.0–17.0)
LYMPHS ABS: 1.3 10*3/uL (ref 0.7–4.0)
Lymphocytes Relative: 19.8 % (ref 12.0–46.0)
MCHC: 33.9 g/dL (ref 30.0–36.0)
MCV: 90.3 fl (ref 78.0–100.0)
MONO ABS: 0.5 10*3/uL (ref 0.1–1.0)
MONOS PCT: 8 % (ref 3.0–12.0)
NEUTROS PCT: 67.5 % (ref 43.0–77.0)
Neutro Abs: 4.6 10*3/uL (ref 1.4–7.7)
PLATELETS: 240 10*3/uL (ref 150.0–400.0)
RBC: 5.75 Mil/uL (ref 4.22–5.81)
RDW: 13.6 % (ref 11.5–15.5)
WBC: 6.8 10*3/uL (ref 4.0–10.5)

## 2015-06-27 DIAGNOSIS — Z85828 Personal history of other malignant neoplasm of skin: Secondary | ICD-10-CM | POA: Diagnosis not present

## 2015-06-27 DIAGNOSIS — L570 Actinic keratosis: Secondary | ICD-10-CM | POA: Diagnosis not present

## 2015-06-27 DIAGNOSIS — L821 Other seborrheic keratosis: Secondary | ICD-10-CM | POA: Diagnosis not present

## 2015-06-27 DIAGNOSIS — D485 Neoplasm of uncertain behavior of skin: Secondary | ICD-10-CM | POA: Diagnosis not present

## 2015-06-27 DIAGNOSIS — D1801 Hemangioma of skin and subcutaneous tissue: Secondary | ICD-10-CM | POA: Diagnosis not present

## 2015-06-28 DIAGNOSIS — N400 Enlarged prostate without lower urinary tract symptoms: Secondary | ICD-10-CM | POA: Diagnosis not present

## 2015-07-05 DIAGNOSIS — R972 Elevated prostate specific antigen [PSA]: Secondary | ICD-10-CM | POA: Diagnosis not present

## 2015-07-05 DIAGNOSIS — Z0000 Encounter for general adult medical examination without abnormal findings: Secondary | ICD-10-CM | POA: Diagnosis not present

## 2015-07-06 ENCOUNTER — Other Ambulatory Visit: Payer: Self-pay | Admitting: Urology

## 2015-07-06 DIAGNOSIS — R972 Elevated prostate specific antigen [PSA]: Secondary | ICD-10-CM

## 2015-07-25 ENCOUNTER — Ambulatory Visit (HOSPITAL_COMMUNITY)
Admission: RE | Admit: 2015-07-25 | Discharge: 2015-07-25 | Disposition: A | Discharge status: Home / Self Care | Payer: PPO | Source: Ambulatory Visit | Attending: Urology | Admitting: Urology

## 2015-07-25 DIAGNOSIS — Z96642 Presence of left artificial hip joint: Secondary | ICD-10-CM | POA: Insufficient documentation

## 2015-07-25 DIAGNOSIS — R972 Elevated prostate specific antigen [PSA]: Secondary | ICD-10-CM | POA: Insufficient documentation

## 2015-07-25 DIAGNOSIS — N400 Enlarged prostate without lower urinary tract symptoms: Secondary | ICD-10-CM | POA: Diagnosis not present

## 2015-07-25 LAB — POCT I-STAT CREATININE: CREATININE: 1.1 mg/dL (ref 0.61–1.24)

## 2015-07-25 MED ORDER — GADOBENATE DIMEGLUMINE 529 MG/ML IV SOLN
20.0000 mL | Freq: Once | INTRAVENOUS | Status: AC | PRN
  Administered 2015-07-25: 20 mL via INTRAVENOUS

## 2015-07-29 ENCOUNTER — Ambulatory Visit (INDEPENDENT_AMBULATORY_CARE_PROVIDER_SITE_OTHER): Payer: PPO | Admitting: Family Medicine

## 2015-07-29 ENCOUNTER — Ambulatory Visit (INDEPENDENT_AMBULATORY_CARE_PROVIDER_SITE_OTHER): Payer: PPO

## 2015-07-29 ENCOUNTER — Other Ambulatory Visit: Payer: Self-pay | Admitting: Internal Medicine

## 2015-07-29 VITALS — BP 110/72 | HR 92 | Temp 98.6°F | Resp 20 | Ht 76.0 in | Wt 214.2 lb

## 2015-07-29 DIAGNOSIS — J9801 Acute bronchospasm: Secondary | ICD-10-CM | POA: Diagnosis not present

## 2015-07-29 DIAGNOSIS — R358 Other polyuria: Secondary | ICD-10-CM

## 2015-07-29 DIAGNOSIS — R6883 Chills (without fever): Secondary | ICD-10-CM | POA: Diagnosis not present

## 2015-07-29 DIAGNOSIS — Z8701 Personal history of pneumonia (recurrent): Secondary | ICD-10-CM | POA: Diagnosis not present

## 2015-07-29 DIAGNOSIS — R05 Cough: Secondary | ICD-10-CM

## 2015-07-29 DIAGNOSIS — R059 Cough, unspecified: Secondary | ICD-10-CM

## 2015-07-29 DIAGNOSIS — J0101 Acute recurrent maxillary sinusitis: Secondary | ICD-10-CM | POA: Diagnosis not present

## 2015-07-29 DIAGNOSIS — R5383 Other fatigue: Secondary | ICD-10-CM | POA: Diagnosis not present

## 2015-07-29 DIAGNOSIS — R3589 Other polyuria: Secondary | ICD-10-CM

## 2015-07-29 LAB — POCT CBC
Granulocyte percent: 77.8 %G (ref 37–80)
HEMATOCRIT: 51.6 % (ref 43.5–53.7)
Hemoglobin: 17.7 g/dL (ref 14.1–18.1)
LYMPH, POC: 1.5 (ref 0.6–3.4)
MCH, POC: 30.2 pg (ref 27–31.2)
MCHC: 34.3 g/dL (ref 31.8–35.4)
MCV: 87.9 fL (ref 80–97)
MID (CBC): 0.6 (ref 0–0.9)
MPV: 7.2 fL (ref 0–99.8)
POC GRANULOCYTE: 7.6 — AB (ref 2–6.9)
POC LYMPH %: 15.6 % (ref 10–50)
POC MID %: 6.6 % (ref 0–12)
Platelet Count, POC: 171 10*3/uL (ref 142–424)
RBC: 5.87 M/uL (ref 4.69–6.13)
RDW, POC: 14.2 %
WBC: 9.8 10*3/uL (ref 4.6–10.2)

## 2015-07-29 LAB — POCT INFLUENZA A/B
Influenza A, POC: NEGATIVE
Influenza B, POC: NEGATIVE

## 2015-07-29 LAB — GLUCOSE, POCT (MANUAL RESULT ENTRY): POC GLUCOSE: 113 mg/dL — AB (ref 70–99)

## 2015-07-29 MED ORDER — ALBUTEROL SULFATE (2.5 MG/3ML) 0.083% IN NEBU
2.5000 mg | INHALATION_SOLUTION | Freq: Once | RESPIRATORY_TRACT | Status: AC
  Administered 2015-07-29: 2.5 mg via RESPIRATORY_TRACT

## 2015-07-29 MED ORDER — AMOXICILLIN-POT CLAVULANATE 875-125 MG PO TABS: 1.0000 | ORAL_TABLET | Freq: Two times a day (BID) | ORAL | Status: DC

## 2015-07-29 NOTE — Patient Instructions (Signed)

## 2015-07-29 NOTE — Progress Notes (Signed)
Subjective:    Patient ID: Bryan [name], male    DOB: 01-31-1946, 70 y.o.   MRN: [id number]  07/29/2015  Cough   HPI This 70 y.o. male presents for evaluation of cold symptoms.  Onset one week ago.  Started with cough.  Worsened four days ago.  Wife disagrees; wife offered to get Mucinex and Allegra-D; started taking these five days ago.  Had pneumonia two years ago.  Deep cough with rattling.  Sneezing.  RHinorrhea.  No fever; +sinus headache mild.  Chills yesterday.  Cough is worse at nighttime.  Really bad last night.  Did sleep well a little bit.  No ear pain or sore throat. Mild rhinorrhea and nasal congesiton.  No SOB.  No wheezing.  No tobacco never.  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  Stressed; mother passed away in Dec 22, 2022.  S/p flu vaccine and pneumovax in 03/2015.  Urinary frequency when takes cold medications; known elevated PSA; s/p MRI of prostate this week; no medications for BPH.   Review of Systems  Constitutional: Positive for chills and fatigue. Negative for fever, diaphoresis, activity change and appetite change.  HENT: Positive for congestion, rhinorrhea and voice change. Negative for ear pain, sinus pressure, sore throat and trouble swallowing.   Respiratory: Positive for cough. Negative for shortness of breath and wheezing.   Cardiovascular: Negative for chest pain, palpitations and leg swelling.  Gastrointestinal: Negative for nausea, vomiting, abdominal pain and diarrhea.  Endocrine: Negative for cold intolerance, heat intolerance, polydipsia, polyphagia and polyuria.  Genitourinary: Positive for frequency.  Skin: Negative for color change, rash and wound.  Neurological: Positive for headaches. Negative for dizziness, tremors, seizures, syncope, facial asymmetry, speech difficulty, weakness, light-headedness and numbness.  Psychiatric/Behavioral: Negative for sleep disturbance and dysphoric mood. The patient is not nervous/anxious.     Past Medical  History  Diagnosis Date  . Hypertension   . Spinal stenosis   . Osteoarthritis    Past Surgical History  Procedure Laterality Date  . Knee surgery      X2  . Achilles tendon surgery  02/2006  . Total hip arthroplasty    . Spine surgery  12-08, 5-10  . Colonoscopy  2003  . Colonoscopy     Allergies  Allergen Reactions  . Codeine     "made me sick"    Social History   Social History  . Marital Status: Married    Spouse Name: N/A  . Number of Children: N/A  . Years of Education: N/A   Occupational History  . Not on file.   Social History Main Topics  . Smoking status: Never Smoker   . Smokeless tobacco: Never Used  . Alcohol Use: No  . Drug Use: No  . Sexual Activity: Not on file   Other Topics Concern  . Not on file   Social History Narrative   Divorced, Exercises regularly   Family History  Problem Relation Age of Onset  . Diabetes Mother   . Coronary artery disease Mother   . Hypertension Mother   . Colon polyps Neg Hx   . Esophageal cancer Neg Hx   . Rectal cancer Neg Hx   . Stomach cancer Neg Hx        Objective:    BP 110/72 mmHg  Pulse 92  Temp(Src) 98.6 F (37 C) (Oral)  Resp 20  Ht 6\' 4"  (1.93 m)  Wt 214 lb 3.2 oz (97.16 kg)  BMI 26.08 kg/m2  SpO2 97% Physical Exam  Constitutional: He is oriented to person, place, and time. He appears well-developed and well-nourished. No distress.  HENT:  Head: Normocephalic and atraumatic.  Right Ear: Tympanic membrane, external ear and ear canal normal.  Left Ear: Tympanic membrane, external ear and ear canal normal.  Nose: Mucosal edema and rhinorrhea present. Right sinus exhibits no maxillary sinus tenderness and no frontal sinus tenderness. Left sinus exhibits no maxillary sinus tenderness and no frontal sinus tenderness.  Mouth/Throat: Uvula is midline, oropharynx is clear and moist and mucous membranes are normal.  Eyes: Conjunctivae and EOM are normal. Pupils are equal, round, and reactive to  light.  Neck: Normal range of motion. Neck supple. Carotid bruit is not present. No thyromegaly present.  Cardiovascular: Normal rate, regular rhythm, normal heart sounds and intact distal pulses.  Exam reveals no gallop and no friction rub.   No murmur heard. Pulmonary/Chest: Effort normal and breath sounds normal. He has no wheezes. He has no rales.  Abdominal: Soft. Bowel sounds are normal. He exhibits no distension and no mass. There is no tenderness. There is no rebound and no guarding.  Lymphadenopathy:    He has no cervical adenopathy.  Neurological: He is alert and oriented to person, place, and time. No cranial nerve deficit.  Skin: Skin is warm and dry. No rash noted. He is not diaphoretic.  Psychiatric: He has a normal mood and affect. His behavior is normal.  Nursing note and vitals reviewed.  Results for orders placed or performed in visit on 07/29/15  POCT CBC  Result Value Ref Range   WBC 9.8 4.6 - 10.2 K/uL   Lymph, poc 1.5 0.6 - 3.4   POC LYMPH PERCENT 15.6 10 - 50 %L   MID (cbc) 0.6 0 - 0.9   POC MID % 6.6 0 - 12 %M   POC Granulocyte 7.6 (A) 2 - 6.9   Granulocyte percent 77.8 37 - 80 %G   RBC 5.87 4.69 - 6.13 M/uL   Hemoglobin 17.7 14.1 - 18.1 g/dL   HCT, POC 51.6 43.5 - 53.7 %   MCV 87.9 80 - 97 fL   MCH, POC 30.2 27 - 31.2 pg   MCHC 34.3 31.8 - 35.4 g/dL   RDW, POC 14.2 %   Platelet Count, POC 171 142 - 424 K/uL   MPV 7.2 0 - 99.8 fL  POCT Influenza A/B  Result Value Ref Range   Influenza A, POC Negative Negative   Influenza B, POC Negative Negative  POCT glucose (manual entry)  Result Value Ref Range   POC Glucose 113 (A) 70 - 99 mg/dl   No results found.   ALBUTEROL NEBULIZER.    Assessment & Plan:   1. Acute recurrent maxillary sinusitis   2. Bronchospasm   3. Cough   4. History of pneumonia   5. Polyuria     Orders Placed This Encounter  Procedures  . DG Chest 2 View    Standing Status: Future     Number of Occurrences: 1     Standing  Expiration Date: 07/28/2016    Order Specific Question:  Reason for Exam (SYMPTOM  OR DIAGNOSIS REQUIRED)    Answer:  cough, history of pneumonia    Order Specific Question:  Preferred imaging location?    Answer:  External  . POCT CBC  . POCT Influenza A/B  . POCT glucose (manual entry)   Meds ordered this encounter  Medications  . albuterol (PROVENTIL) (2.5 MG/3ML)  0.083% nebulizer solution 2.5 mg    Sig:   . amoxicillin-clavulanate (AUGMENTIN) 875-125 MG tablet    Sig: Take 1 tablet by mouth 2 (two) times daily.    Dispense:  20 tablet    Refill:  0    No Follow-up on file.    Danta Baumgardner Elayne Guerin, M.D. [facility] [address] [place], [place]  [address] [phone] phone [phone] fax

## 2015-08-29 ENCOUNTER — Other Ambulatory Visit: Payer: Self-pay | Admitting: Internal Medicine

## 2015-09-10 ENCOUNTER — Other Ambulatory Visit: Payer: Self-pay | Admitting: Internal Medicine

## 2015-12-04 ENCOUNTER — Other Ambulatory Visit: Payer: Self-pay | Admitting: Internal Medicine

## 2015-12-10 ENCOUNTER — Other Ambulatory Visit: Payer: Self-pay | Admitting: Internal Medicine

## 2015-12-24 DIAGNOSIS — R972 Elevated prostate specific antigen [PSA]: Secondary | ICD-10-CM | POA: Diagnosis not present

## 2015-12-31 DIAGNOSIS — L570 Actinic keratosis: Secondary | ICD-10-CM | POA: Diagnosis not present

## 2015-12-31 DIAGNOSIS — R972 Elevated prostate specific antigen [PSA]: Secondary | ICD-10-CM | POA: Diagnosis not present

## 2015-12-31 DIAGNOSIS — Z85828 Personal history of other malignant neoplasm of skin: Secondary | ICD-10-CM | POA: Diagnosis not present

## 2015-12-31 DIAGNOSIS — L821 Other seborrheic keratosis: Secondary | ICD-10-CM | POA: Diagnosis not present

## 2016-01-08 ENCOUNTER — Telehealth: Payer: Self-pay | Admitting: Internal Medicine

## 2016-01-08 NOTE — Telephone Encounter (Signed)
Pt would like to know if there is a test that will show what vitamins deficiency he may be lacking.

## 2016-01-14 ENCOUNTER — Other Ambulatory Visit (INDEPENDENT_AMBULATORY_CARE_PROVIDER_SITE_OTHER): Payer: PPO

## 2016-01-14 DIAGNOSIS — Z0000 Encounter for general adult medical examination without abnormal findings: Secondary | ICD-10-CM | POA: Diagnosis not present

## 2016-01-14 LAB — HEPATIC FUNCTION PANEL
ALT: 18 U/L (ref 0–53)
AST: 21 U/L (ref 0–37)
Albumin: 4.1 g/dL (ref 3.5–5.2)
Alkaline Phosphatase: 46 U/L (ref 39–117)
BILIRUBIN TOTAL: 1.3 mg/dL — AB (ref 0.2–1.2)
Bilirubin, Direct: 0.2 mg/dL (ref 0.0–0.3)
Total Protein: 6.5 g/dL (ref 6.0–8.3)

## 2016-01-14 LAB — LIPID PANEL
CHOL/HDL RATIO: 4
CHOLESTEROL: 185 mg/dL (ref 0–200)
HDL: 41.4 mg/dL (ref >39.00)
LDL CALC: 113 mg/dL — AB (ref 0–99)
NONHDL: 143.99
Triglycerides: 155 mg/dL — ABNORMAL HIGH (ref 0.0–149.0)
VLDL: 31 mg/dL (ref 0.0–40.0)

## 2016-01-14 LAB — CBC WITH DIFFERENTIAL/PLATELET
Basophils Absolute: 0 10*3/uL (ref 0.0–0.1)
Basophils Relative: 0.6 % (ref 0.0–3.0)
EOS PCT: 4.2 % (ref 0.0–5.0)
Eosinophils Absolute: 0.2 10*3/uL (ref 0.0–0.7)
HCT: 51.3 % (ref 39.0–52.0)
HEMOGLOBIN: 17.3 g/dL — AB (ref 13.0–17.0)
Lymphocytes Relative: 23.4 % (ref 12.0–46.0)
Lymphs Abs: 1.4 10*3/uL (ref 0.7–4.0)
MCHC: 33.8 g/dL (ref 30.0–36.0)
MCV: 88.8 fl (ref 78.0–100.0)
MONO ABS: 0.5 10*3/uL (ref 0.1–1.0)
MONOS PCT: 8.9 % (ref 3.0–12.0)
Neutro Abs: 3.7 10*3/uL (ref 1.4–7.7)
Neutrophils Relative %: 62.9 % (ref 43.0–77.0)
Platelets: 250 10*3/uL (ref 150.0–400.0)
RBC: 5.77 Mil/uL (ref 4.22–5.81)
RDW: 13.8 % (ref 11.5–15.5)
WBC: 5.8 10*3/uL (ref 4.0–10.5)

## 2016-01-14 LAB — POC URINALSYSI DIPSTICK (AUTOMATED)
BILIRUBIN UA: NEGATIVE
Glucose, UA: NEGATIVE
KETONES UA: NEGATIVE
Leukocytes, UA: NEGATIVE
NITRITE UA: NEGATIVE
PH UA: 5.5
Protein, UA: NEGATIVE
SPEC GRAV UA: 1.015
Urobilinogen, UA: 0.2

## 2016-01-14 LAB — BASIC METABOLIC PANEL
BUN: 27 mg/dL — AB (ref 6–23)
CO2: 28 mEq/L (ref 19–32)
Calcium: 9.7 mg/dL (ref 8.4–10.5)
Chloride: 102 mEq/L (ref 96–112)
Creatinine, Ser: 1.15 mg/dL (ref 0.40–1.50)
GFR: 67.29 mL/min (ref >60.00)
Glucose, Bld: 100 mg/dL — ABNORMAL HIGH (ref 70–99)
POTASSIUM: 4.3 meq/L (ref 3.5–5.1)
SODIUM: 139 meq/L (ref 135–145)

## 2016-01-14 LAB — TSH: TSH: 2.34 u[IU]/mL (ref 0.35–4.50)

## 2016-01-14 LAB — VITAMIN D 25 HYDROXY (VIT D DEFICIENCY, FRACTURES): VITD: 29.85 ng/mL — ABNORMAL LOW (ref 30.00–100.00)

## 2016-01-14 LAB — PSA: PSA: 7.39 ng/mL — AB (ref 0.10–4.00)

## 2016-01-18 ENCOUNTER — Ambulatory Visit (INDEPENDENT_AMBULATORY_CARE_PROVIDER_SITE_OTHER): Payer: PPO | Admitting: Internal Medicine

## 2016-01-18 ENCOUNTER — Encounter: Payer: Self-pay | Admitting: Internal Medicine

## 2016-01-18 VITALS — BP 112/78 | HR 91 | Temp 98.7°F | Ht 75.0 in | Wt 213.8 lb

## 2016-01-18 DIAGNOSIS — I10 Essential (primary) hypertension: Secondary | ICD-10-CM | POA: Diagnosis not present

## 2016-01-18 DIAGNOSIS — Z0000 Encounter for general adult medical examination without abnormal findings: Secondary | ICD-10-CM

## 2016-01-18 DIAGNOSIS — M153 Secondary multiple arthritis: Secondary | ICD-10-CM

## 2016-01-18 DIAGNOSIS — R972 Elevated prostate specific antigen [PSA]: Secondary | ICD-10-CM

## 2016-01-18 NOTE — Progress Notes (Signed)
Pre visit review using our clinic review tool, if applicable. No additional management support is needed unless otherwise documented below in the visit note. 

## 2016-01-18 NOTE — Progress Notes (Signed)
Subjective:    Patient ID: Bryan [name], male    DOB: February 02, 1946, 70 y.o.   MRN: [id number]  HPI   Subjective:    Patient ID: Bryan [name], male    DOB: [birth year], 70 y.o.   MRN: [id number]  HPI  Patient ID: Bryan [name], male   DOB: [birth year], 70 y.o.   MRN: [id number]  Subjective:    Patient ID: Bryan [name], male    DOB: 03-13-1946, 70 y.o.   MRN: [id number]  HPI  70 -year-old patient who is seen today for a wellness exam.  He has a history of treated hypertension and osteoarthritis. He has required  a number of orthopedic procedures. He is doing quite well today except for a osteoarthritic complaints.  Back pain is his predominant concern.  Both elderly parents reside with him and he requires considerable bending and lifting.  He is playing golf More frequently this year, but still having some orthopedic issues.  He continues be followed by urology.  Twice annually did have a MR of the prostate earlier this year has had 2 prostate biopsies over the years  PSA remains elevated but stable  Past Medical History:   Hypertension  Spinal stenosis  Osteoarthritis   Past Surgical History:  Knee surgery x2  Rupt achilles tendon 2/08  Total hip replacement left  laminectomy for spinal stenosis, October 08  HNP 10-2008 [facility])  colonoscopy 2003 2014 ETT 2004   Family History:   both parents are still living;mother history of diabetes, status post CABG, hypertension, DJD  NO siblings   Social History:   Divorced  Never Smoked  Regular exercise-yes   1. Risk factors, based on past  M,S,F history.  No cardiovascular risk factors except hypertension   2.  Physical activities: Remains quite active physically with occasional golf and daily swimming and biking  3.  Depression/mood: No history depression or mood disorder  4.  Hearing: No deficits  5.  ADL's: Independent  6.  Fall risk: Low  7.  Home safety: No problems identified  8.  Height weight,  and visual acuity; height and weight stable no change in visual acuity  9.  Counseling: Heart healthy diet regular exercise.  All encouraged  10. Lab orders based on risk factors: Laboratory profile reviewed   11. Referral : Follow-up urology.  Annual eye examination encouraged  12. Care plan: Continue aggressive blood pressure control and risk factor modification  13. Cognitive assessment: Alert and oriented with normal affect.  No cognitive dysfunction  14. Screening: We'll continue annual preventive health examinations with screening lab.  He will be seen by urology at least annually with PSA determinations.  He will be maintained on 10 year colonoscopy surveillance.  Annual eye examinations recommended  15. Provider List Update: Primary care urology, ophthalmology and orthopedics   Review of Systems  Constitutional: Negative for fever, chills, appetite change and fatigue.  HENT: Negative for hearing loss, ear pain, congestion, sore throat, trouble swallowing, neck stiffness, dental problem, voice change and tinnitus.   Eyes: Negative for pain, discharge and visual disturbance.  Respiratory: Negative for cough, chest tightness, wheezing and stridor.   Cardiovascular: Negative for chest pain, palpitations and leg swelling.  Gastrointestinal: Negative for nausea, vomiting, abdominal pain, diarrhea, constipation, blood in stool and abdominal distention.  Genitourinary: Negative for urgency, hematuria, flank pain, discharge, difficulty urinating and genital sores.  Musculoskeletal: Positive for back pain and arthralgias. Negative for myalgias, joint swelling and gait problem.  Skin: Negative for rash.  Neurological: Negative for dizziness, syncope, speech difficulty, weakness, numbness and headaches.  Hematological: Negative for adenopathy. Does not bruise/bleed easily.  Psychiatric/Behavioral: Negative for behavioral problems and dysphoric mood. The patient is not nervous/anxious.         Objective:   Physical Exam  Constitutional: He appears well-developed and well-nourished.  HENT:  Head: Normocephalic and atraumatic.  Right Ear: External ear normal.  Left Ear: External ear normal.  Nose: Nose normal.  Mouth/Throat: Oropharynx is clear and moist.  Eyes: Conjunctivae and EOM are normal. Pupils are equal, round, and reactive to light. No scleral icterus.  Neck: Normal range of motion. Neck supple. No JVD present. No thyromegaly present.  Cardiovascular: Regular rhythm, normal heart sounds and intact distal pulses.  Exam reveals no gallop and no friction rub.   No murmur heard. pedal pulses full  Pulmonary/Chest: Effort normal and breath sounds normal. He exhibits no tenderness.  Abdominal: Soft. Bowel sounds are normal. He exhibits no distension and no mass. There is no tenderness.  Genitourinary: Prostate normal and penis normal.  Musculoskeletal: Normal range of motion. He exhibits no edema and no tenderness.  Lymphadenopathy:    He has no cervical adenopathy.  Neurological: He is alert. He has normal reflexes. No cranial nerve deficit. Coordination normal.  Skin: Skin is warm and dry. No rash noted.  Psychiatric: He has a normal mood and affect. His behavior is normal.          Assessment & Plan:    Preventive health examination-needs followup colonoscopy in 2024 Osteoarthritis Hypertension stable Elevated PSA followup urology. Copy of lab dispensed  Follow up urology in June Recheck one year Medications refilled  Review of Systems  As above     Objective:   Physical Exam  As above       Assessment & Plan:   .  As above    Review of Systems As above    Objective:   Physical Exam  As above      Assessment & Plan:   Preventive health examination Hypertension.  Excellent control.  Continue home blood pressure monitoring.  Low-salt diet Osteoarthritis BPH with elevated PSA.  Urology follow-up  Return here in one year  or as needed  Nyoka Cowden, MD

## 2016-01-18 NOTE — Patient Instructions (Signed)
Limit your sodium (Salt) intake  Please check your blood pressure on a regular basis.  If it is consistently greater than 150/90, please make an office appointment.    It is important that you exercise regularly, at least 20 minutes 3 to 4 times per week.  If you develop chest pain or shortness of breath seek  medical attention.  Return in one year for follow-up  

## 2016-03-25 ENCOUNTER — Ambulatory Visit (INDEPENDENT_AMBULATORY_CARE_PROVIDER_SITE_OTHER): Payer: PPO | Admitting: *Deleted

## 2016-03-25 ENCOUNTER — Ambulatory Visit: Payer: PPO | Admitting: *Deleted

## 2016-03-25 DIAGNOSIS — Z23 Encounter for immunization: Secondary | ICD-10-CM

## 2016-04-28 DIAGNOSIS — M25561 Pain in right knee: Secondary | ICD-10-CM | POA: Diagnosis not present

## 2016-05-23 ENCOUNTER — Other Ambulatory Visit: Payer: Self-pay | Admitting: Internal Medicine

## 2016-05-28 ENCOUNTER — Telehealth: Payer: Self-pay | Admitting: Internal Medicine

## 2016-05-28 NOTE — Telephone Encounter (Signed)
° ° ° °  Pt said he was told by Butch Penny when he needed to refill the below med to call her and she would see if she can help him out. He said it cost 30.00    cyclobenzaprine (FLEXERIL) 10 MG tablet

## 2016-05-30 DIAGNOSIS — H2513 Age-related nuclear cataract, bilateral: Secondary | ICD-10-CM | POA: Diagnosis not present

## 2016-05-30 DIAGNOSIS — D3132 Benign neoplasm of left choroid: Secondary | ICD-10-CM | POA: Diagnosis not present

## 2016-05-30 DIAGNOSIS — H02831 Dermatochalasis of right upper eyelid: Secondary | ICD-10-CM | POA: Diagnosis not present

## 2016-05-30 DIAGNOSIS — H02834 Dermatochalasis of left upper eyelid: Secondary | ICD-10-CM | POA: Diagnosis not present

## 2016-06-02 NOTE — Telephone Encounter (Signed)
Pt is returning donna call and is aware donna is out of office and will be back tomorrow

## 2016-06-03 ENCOUNTER — Other Ambulatory Visit: Payer: Self-pay | Admitting: Internal Medicine

## 2016-06-03 MED ORDER — TIZANIDINE HCL 4 MG PO TABS: 4.0000 mg | ORAL_TABLET | Freq: Three times a day (TID) | ORAL | 2 refills | Status: DC | PRN

## 2016-06-03 NOTE — Telephone Encounter (Signed)
Left message on voicemail to call office.  

## 2016-06-03 NOTE — Telephone Encounter (Signed)
Left detailed message on personal voicemail new Rx for Tizanidine 4 mg sent to pharmacy. Any questions please call office.

## 2016-06-03 NOTE — Telephone Encounter (Signed)
Discussed with Dr.K okay to change pt to Tizanidine 4 mg due to cost of Flexeril. Rx sent to pharmacy.

## 2016-06-03 NOTE — Telephone Encounter (Signed)
Pt called back, said Rx for Cyclobenzaprine was sent to pharmacy and it was $30 wanted to know if there is something cheaper we could try. Told pt will see if Dr.K will change to Tizanidine which is similar and I will get back to you. Pt verbalized understanding.

## 2016-07-01 DIAGNOSIS — R972 Elevated prostate specific antigen [PSA]: Secondary | ICD-10-CM | POA: Diagnosis not present

## 2016-07-02 ENCOUNTER — Other Ambulatory Visit: Payer: Self-pay | Admitting: Internal Medicine

## 2016-07-07 ENCOUNTER — Other Ambulatory Visit: Payer: Self-pay | Admitting: Internal Medicine

## 2016-07-09 DIAGNOSIS — R972 Elevated prostate specific antigen [PSA]: Secondary | ICD-10-CM | POA: Diagnosis not present

## 2016-07-17 DIAGNOSIS — L718 Other rosacea: Secondary | ICD-10-CM | POA: Diagnosis not present

## 2016-07-17 DIAGNOSIS — Z85828 Personal history of other malignant neoplasm of skin: Secondary | ICD-10-CM | POA: Diagnosis not present

## 2016-07-17 DIAGNOSIS — L570 Actinic keratosis: Secondary | ICD-10-CM | POA: Diagnosis not present

## 2016-07-17 DIAGNOSIS — L821 Other seborrheic keratosis: Secondary | ICD-10-CM | POA: Diagnosis not present

## 2016-08-01 ENCOUNTER — Other Ambulatory Visit: Payer: Self-pay | Admitting: Internal Medicine

## 2016-08-05 ENCOUNTER — Other Ambulatory Visit: Payer: Self-pay | Admitting: Internal Medicine

## 2016-09-05 ENCOUNTER — Other Ambulatory Visit: Payer: Self-pay | Admitting: Internal Medicine

## 2016-10-02 ENCOUNTER — Other Ambulatory Visit: Payer: Self-pay | Admitting: Internal Medicine

## 2016-10-17 DIAGNOSIS — M1611 Unilateral primary osteoarthritis, right hip: Secondary | ICD-10-CM | POA: Diagnosis not present

## 2016-11-04 DIAGNOSIS — M549 Dorsalgia, unspecified: Secondary | ICD-10-CM | POA: Diagnosis not present

## 2016-11-04 DIAGNOSIS — M47816 Spondylosis without myelopathy or radiculopathy, lumbar region: Secondary | ICD-10-CM | POA: Diagnosis not present

## 2016-11-04 DIAGNOSIS — M545 Low back pain: Secondary | ICD-10-CM | POA: Diagnosis not present

## 2016-11-04 DIAGNOSIS — M546 Pain in thoracic spine: Secondary | ICD-10-CM | POA: Diagnosis not present

## 2016-11-04 DIAGNOSIS — M5136 Other intervertebral disc degeneration, lumbar region: Secondary | ICD-10-CM | POA: Diagnosis not present

## 2016-11-04 DIAGNOSIS — M4156 Other secondary scoliosis, lumbar region: Secondary | ICD-10-CM | POA: Diagnosis not present

## 2016-12-03 ENCOUNTER — Other Ambulatory Visit: Payer: Self-pay | Admitting: Internal Medicine

## 2016-12-08 ENCOUNTER — Other Ambulatory Visit: Payer: Self-pay | Admitting: Internal Medicine

## 2017-01-06 ENCOUNTER — Other Ambulatory Visit: Payer: Self-pay | Admitting: Internal Medicine

## 2017-01-07 DIAGNOSIS — L821 Other seborrheic keratosis: Secondary | ICD-10-CM | POA: Diagnosis not present

## 2017-01-07 DIAGNOSIS — D1801 Hemangioma of skin and subcutaneous tissue: Secondary | ICD-10-CM | POA: Diagnosis not present

## 2017-01-07 DIAGNOSIS — L565 Disseminated superficial actinic porokeratosis (DSAP): Secondary | ICD-10-CM | POA: Diagnosis not present

## 2017-01-07 DIAGNOSIS — Z85828 Personal history of other malignant neoplasm of skin: Secondary | ICD-10-CM | POA: Diagnosis not present

## 2017-01-07 DIAGNOSIS — L570 Actinic keratosis: Secondary | ICD-10-CM | POA: Diagnosis not present

## 2017-01-08 DIAGNOSIS — R972 Elevated prostate specific antigen [PSA]: Secondary | ICD-10-CM | POA: Diagnosis not present

## 2017-01-14 ENCOUNTER — Other Ambulatory Visit (INDEPENDENT_AMBULATORY_CARE_PROVIDER_SITE_OTHER): Payer: PPO

## 2017-01-14 DIAGNOSIS — R972 Elevated prostate specific antigen [PSA]: Secondary | ICD-10-CM | POA: Diagnosis not present

## 2017-01-14 DIAGNOSIS — E559 Vitamin D deficiency, unspecified: Secondary | ICD-10-CM

## 2017-01-14 DIAGNOSIS — I10 Essential (primary) hypertension: Secondary | ICD-10-CM | POA: Diagnosis not present

## 2017-01-14 LAB — HEPATIC FUNCTION PANEL
ALBUMIN: 4.1 g/dL (ref 3.5–5.2)
ALK PHOS: 48 U/L (ref 39–117)
ALT: 15 U/L (ref 0–53)
AST: 19 U/L (ref 0–37)
Bilirubin, Direct: 0.2 mg/dL (ref 0.0–0.3)
TOTAL PROTEIN: 6.1 g/dL (ref 6.0–8.3)
Total Bilirubin: 1.1 mg/dL (ref 0.2–1.2)

## 2017-01-14 LAB — VITAMIN D 25 HYDROXY (VIT D DEFICIENCY, FRACTURES): VITD: 31.79 ng/mL (ref 30.00–100.00)

## 2017-01-14 LAB — CBC WITH DIFFERENTIAL/PLATELET
BASOS ABS: 0.1 10*3/uL (ref 0.0–0.1)
Basophils Relative: 1.2 % (ref 0.0–3.0)
Eosinophils Absolute: 0.2 10*3/uL (ref 0.0–0.7)
Eosinophils Relative: 4.8 % (ref 0.0–5.0)
HEMATOCRIT: 50.6 % (ref 39.0–52.0)
HEMOGLOBIN: 17.2 g/dL — AB (ref 13.0–17.0)
LYMPHS PCT: 27 % (ref 12.0–46.0)
Lymphs Abs: 1.4 10*3/uL (ref 0.7–4.0)
MCHC: 34.1 g/dL (ref 30.0–36.0)
MCV: 90.9 fl (ref 78.0–100.0)
MONOS PCT: 9.1 % (ref 3.0–12.0)
Monocytes Absolute: 0.5 10*3/uL (ref 0.1–1.0)
NEUTROS ABS: 2.9 10*3/uL (ref 1.4–7.7)
Neutrophils Relative %: 57.9 % (ref 43.0–77.0)
PLATELETS: 240 10*3/uL (ref 150.0–400.0)
RBC: 5.57 Mil/uL (ref 4.22–5.81)
RDW: 13.5 % (ref 11.5–15.5)
WBC: 5 10*3/uL (ref 4.0–10.5)

## 2017-01-14 LAB — PSA: PSA: 7.24 ng/mL — AB (ref 0.10–4.00)

## 2017-01-14 LAB — LIPID PANEL
CHOL/HDL RATIO: 5
Cholesterol: 176 mg/dL (ref 0–200)
HDL: 37.2 mg/dL — AB (ref >39.00)
LDL Cholesterol: 110 mg/dL — ABNORMAL HIGH (ref 0–99)
NONHDL: 139.05
Triglycerides: 146 mg/dL (ref 0.0–149.0)
VLDL: 29.2 mg/dL (ref 0.0–40.0)

## 2017-01-14 LAB — POC URINALSYSI DIPSTICK (AUTOMATED)
Bilirubin, UA: NEGATIVE
Blood, UA: NEGATIVE
Clarity, UA: NEGATIVE
Glucose, UA: NEGATIVE
KETONES UA: NEGATIVE
Leukocytes, UA: NEGATIVE
Nitrite, UA: NEGATIVE
PH UA: 6 (ref 5.0–8.0)
PROTEIN UA: NEGATIVE
Spec Grav, UA: >=1.03 — AB (ref 1.010–1.025)
UROBILINOGEN UA: 0.2 U/dL

## 2017-01-14 LAB — BASIC METABOLIC PANEL
BUN: 26 mg/dL — ABNORMAL HIGH (ref 6–23)
CALCIUM: 9.4 mg/dL (ref 8.4–10.5)
CHLORIDE: 105 meq/L (ref 96–112)
CO2: 25 meq/L (ref 19–32)
Creatinine, Ser: 1.17 mg/dL (ref 0.40–1.50)
GFR: 65.77 mL/min (ref >60.00)
Glucose, Bld: 104 mg/dL — ABNORMAL HIGH (ref 70–99)
Potassium: 4.1 mEq/L (ref 3.5–5.1)
SODIUM: 137 meq/L (ref 135–145)

## 2017-01-14 LAB — TSH: TSH: 3.58 u[IU]/mL (ref 0.35–4.50)

## 2017-01-20 ENCOUNTER — Ambulatory Visit (INDEPENDENT_AMBULATORY_CARE_PROVIDER_SITE_OTHER): Payer: PPO | Admitting: Internal Medicine

## 2017-01-20 ENCOUNTER — Encounter: Payer: Self-pay | Admitting: Internal Medicine

## 2017-01-20 VITALS — BP 102/64 | HR 64 | Temp 98.0°F | Ht 75.0 in | Wt 213.0 lb

## 2017-01-20 DIAGNOSIS — Z0000 Encounter for general adult medical examination without abnormal findings: Secondary | ICD-10-CM | POA: Diagnosis not present

## 2017-01-20 MED ORDER — MELOXICAM 15 MG PO TABS
15.0000 mg | ORAL_TABLET | Freq: Every day | ORAL | 3 refills | Status: DC
Start: 2017-01-20 — End: 2018-01-26

## 2017-01-20 MED ORDER — LISINOPRIL-HYDROCHLOROTHIAZIDE 20-25 MG PO TABS: ORAL_TABLET | ORAL | 4 refills | Status: DC

## 2017-01-20 NOTE — Progress Notes (Signed)
Subjective:    Patient ID: Bryan [name], male    DOB: 1946/02/02, 71 y.o.   MRN: [id number]  HPI  71 year-old patient who is seen today for a annual exam and Medicare wellness visit. Continues to do quite well.  Is followed by urology biannually due to an elevated PSA Main problem continues to be arthritis especially chronic low back pain. He has essential hypertension which has been very well controlled  Family history both parents are now deceased.  Father 35 and mother at 12.  Mother had type 2 diabetes and coronary artery disease No siblings   Past Medical History:  Diagnosis Date  . Hypertension   . Osteoarthritis   . Spinal stenosis      Social History   Social History  . Marital status: Married    Spouse name: N/A  . Number of children: N/A  . Years of education: N/A   Occupational History  . Not on file.   Social History Main Topics  . Smoking status: Never Smoker  . Smokeless tobacco: Never Used  . Alcohol use No  . Drug use: No  . Sexual activity: Not on file   Other Topics Concern  . Not on file   Social History Narrative   Divorced, Exercises regularly    Past Surgical History:  Procedure Laterality Date  . ACHILLES TENDON SURGERY  02/2006  . COLONOSCOPY  2003  . COLONOSCOPY    . KNEE SURGERY     X2  . SPINE SURGERY  12-08, 5-10  . TOTAL HIP ARTHROPLASTY      Family History  Problem Relation Age of Onset  . Diabetes Mother   . Coronary artery disease Mother   . Hypertension Mother   . Colon polyps Neg Hx   . Esophageal cancer Neg Hx   . Rectal cancer Neg Hx   . Stomach cancer Neg Hx     Allergies  Allergen Reactions  . Codeine     "made me sick"    Current Outpatient Prescriptions on File Prior to Visit  Medication Sig Dispense Refill  . lisinopril-hydrochlorothiazide (PRINZIDE,ZESTORETIC) 20-25 MG tablet TAKE (1) TABLET DAILY. 30 tablet 0  . meloxicam (MOBIC) 15 MG tablet TAKE 1 TABLET ONCE DAILY. 30 tablet 1  . Misc  Natural Products (OSTEO BI-FLEX ADV DOUBLE ST PO) Take by mouth daily. Reported on 07/29/2015    . Multiple Vitamins-Minerals (MULTIVITAMIN PO) Take by mouth daily.    Marland Kitchen tiZANidine (ZANAFLEX) 4 MG tablet Take 1 tablet (4 mg total) by mouth 3 (three) times daily as needed for muscle spasms. 60 tablet 2   No current facility-administered medications on file prior to visit.     BP 102/64 (BP Location: Left Arm, Patient Position: Sitting, Cuff Size: Normal)   Pulse 64   Temp 98 F (36.7 C) (Oral)   Ht 6\' 3"  (1.905 m)   Wt 213 lb (96.6 kg)   SpO2 97%   BMI 26.62 kg/m   Medicare wellness visit  1. Risk factors, based on past  M,S,F history.  cardiovascular risk factors include hypertension only  2.  Physical activities:remains quite active with golf.  Swims twice daily  3.  Depression/mood:no history of major depression or mood disorder  4.  Hearing:no deficits  5.  ADL's:independent  6.  Fall risk:low  7.  Home safety:no problems identified  8.  Height weight, and visual acuity;height and weight stable no change in visual acuity  9.  Counseling:continue heart  healthy diet and regular exercise  10. Lab orders based on risk factors:laboratory panel reviewed.  PSA remains elevated but stable  11. Referral :follow-up urology  12. Care plan:continue efforts at aggressive risk factor modification  13. Cognitive assessment: alert and oriented with normal affect no cognitive dysfunction  14. Screening: Patient provided with a written and personalized 5-10 year screening schedule in the AVS.    15. Provider List Update: primary care urology and orthopedics    Review of Systems  Constitutional: Negative for appetite change, chills, fatigue and fever.  HENT: Negative for congestion, dental problem, ear pain, hearing loss, sore throat, tinnitus, trouble swallowing and voice change.   Eyes: Negative for pain, discharge and visual disturbance.  Respiratory: Negative for cough,  chest tightness, wheezing and stridor.   Cardiovascular: Negative for chest pain, palpitations and leg swelling.  Gastrointestinal: Negative for abdominal distention, abdominal pain, blood in stool, constipation, diarrhea, nausea and vomiting.  Genitourinary: Negative for difficulty urinating, discharge, flank pain, genital sores, hematuria and urgency.  Musculoskeletal: Positive for back pain. Negative for arthralgias, gait problem, joint swelling, myalgias and neck stiffness.  Skin: Negative for rash.  Neurological: Negative for dizziness, syncope, speech difficulty, weakness, numbness and headaches.  Hematological: Negative for adenopathy. Does not bruise/bleed easily.  Psychiatric/Behavioral: Negative for behavioral problems and dysphoric mood. The patient is not nervous/anxious.        Objective:   Physical Exam  Constitutional: He appears well-developed and well-nourished.  HENT:  Head: Normocephalic and atraumatic.  Right Ear: External ear normal.  Left Ear: External ear normal.  Nose: Nose normal.  Mouth/Throat: Oropharynx is clear and moist.  Eyes: Pupils are equal, round, and reactive to light. Conjunctivae and EOM are normal. No scleral icterus.  Neck: Normal range of motion. Neck supple. No JVD present. No thyromegaly present.  Cardiovascular: Regular rhythm, normal heart sounds and intact distal pulses.  Exam reveals no gallop and no friction rub.   No murmur heard. Pulmonary/Chest: Effort normal and breath sounds normal. He exhibits no tenderness.  Abdominal: Soft. Bowel sounds are normal. He exhibits no distension and no mass. There is no tenderness.  Genitourinary: Penis normal.  Musculoskeletal: Normal range of motion. He exhibits no edema or tenderness.  Lymphadenopathy:    He has no cervical adenopathy.  Neurological: He is alert. He has normal reflexes. No cranial nerve deficit. Coordination normal.  Skin: Skin is warm and dry. No rash noted.  Psychiatric: He has  a normal mood and affect. His behavior is normal.          Assessment & Plan:   Preventive health examination Subsequent Medicare wellness visit Essential hypertension, stable Osteoarthritis Elevated PSA.  Follow-up urology  Continue home blood pressure monitoring Recheck here one year or as needed  Nyoka Cowden

## 2017-01-20 NOTE — Patient Instructions (Signed)
Limit your sodium (Salt) intake  Please check your blood pressure on a regular basis.  If it is consistently greater than 150/90, please make an office appointment.    It is important that you exercise regularly, at least 20 minutes 3 to 4 times per week.  If you develop chest pain or shortness of breath seek  medical attention.  Return in one year for follow-up  

## 2017-02-11 DIAGNOSIS — R972 Elevated prostate specific antigen [PSA]: Secondary | ICD-10-CM | POA: Diagnosis not present

## 2017-02-11 DIAGNOSIS — N400 Enlarged prostate without lower urinary tract symptoms: Secondary | ICD-10-CM | POA: Diagnosis not present

## 2017-03-09 DIAGNOSIS — M25551 Pain in right hip: Secondary | ICD-10-CM | POA: Diagnosis not present

## 2017-03-09 DIAGNOSIS — G8929 Other chronic pain: Secondary | ICD-10-CM | POA: Diagnosis not present

## 2017-03-09 DIAGNOSIS — M1611 Unilateral primary osteoarthritis, right hip: Secondary | ICD-10-CM | POA: Diagnosis not present

## 2017-03-09 DIAGNOSIS — Z96642 Presence of left artificial hip joint: Secondary | ICD-10-CM | POA: Diagnosis not present

## 2017-03-25 DIAGNOSIS — M1611 Unilateral primary osteoarthritis, right hip: Secondary | ICD-10-CM | POA: Diagnosis not present

## 2017-04-13 ENCOUNTER — Ambulatory Visit (INDEPENDENT_AMBULATORY_CARE_PROVIDER_SITE_OTHER): Payer: PPO | Admitting: *Deleted

## 2017-04-13 DIAGNOSIS — Z23 Encounter for immunization: Secondary | ICD-10-CM

## 2017-06-02 DIAGNOSIS — H02834 Dermatochalasis of left upper eyelid: Secondary | ICD-10-CM | POA: Diagnosis not present

## 2017-06-02 DIAGNOSIS — H2513 Age-related nuclear cataract, bilateral: Secondary | ICD-10-CM | POA: Diagnosis not present

## 2017-06-02 DIAGNOSIS — D3132 Benign neoplasm of left choroid: Secondary | ICD-10-CM | POA: Diagnosis not present

## 2017-06-02 DIAGNOSIS — H02831 Dermatochalasis of right upper eyelid: Secondary | ICD-10-CM | POA: Diagnosis not present

## 2017-06-05 DIAGNOSIS — M5136 Other intervertebral disc degeneration, lumbar region: Secondary | ICD-10-CM | POA: Diagnosis not present

## 2017-06-05 DIAGNOSIS — M4156 Other secondary scoliosis, lumbar region: Secondary | ICD-10-CM | POA: Diagnosis not present

## 2017-06-05 DIAGNOSIS — M47816 Spondylosis without myelopathy or radiculopathy, lumbar region: Secondary | ICD-10-CM | POA: Diagnosis not present

## 2017-06-05 DIAGNOSIS — M5416 Radiculopathy, lumbar region: Secondary | ICD-10-CM | POA: Diagnosis not present

## 2017-06-11 DIAGNOSIS — M5440 Lumbago with sciatica, unspecified side: Secondary | ICD-10-CM | POA: Diagnosis not present

## 2017-06-17 DIAGNOSIS — M48061 Spinal stenosis, lumbar region without neurogenic claudication: Secondary | ICD-10-CM | POA: Diagnosis not present

## 2017-06-17 DIAGNOSIS — M5416 Radiculopathy, lumbar region: Secondary | ICD-10-CM | POA: Diagnosis not present

## 2017-06-22 DIAGNOSIS — M5416 Radiculopathy, lumbar region: Secondary | ICD-10-CM | POA: Diagnosis not present

## 2017-06-22 DIAGNOSIS — M4156 Other secondary scoliosis, lumbar region: Secondary | ICD-10-CM | POA: Diagnosis not present

## 2017-06-22 DIAGNOSIS — M25551 Pain in right hip: Secondary | ICD-10-CM | POA: Diagnosis not present

## 2017-06-22 DIAGNOSIS — M5136 Other intervertebral disc degeneration, lumbar region: Secondary | ICD-10-CM | POA: Diagnosis not present

## 2017-06-22 DIAGNOSIS — M48062 Spinal stenosis, lumbar region with neurogenic claudication: Secondary | ICD-10-CM | POA: Diagnosis not present

## 2017-06-22 DIAGNOSIS — M5440 Lumbago with sciatica, unspecified side: Secondary | ICD-10-CM | POA: Diagnosis not present

## 2017-07-02 DIAGNOSIS — M48061 Spinal stenosis, lumbar region without neurogenic claudication: Secondary | ICD-10-CM | POA: Diagnosis not present

## 2017-07-02 DIAGNOSIS — M5136 Other intervertebral disc degeneration, lumbar region: Secondary | ICD-10-CM | POA: Diagnosis not present

## 2017-07-02 DIAGNOSIS — M4726 Other spondylosis with radiculopathy, lumbar region: Secondary | ICD-10-CM | POA: Diagnosis not present

## 2017-07-15 DIAGNOSIS — L570 Actinic keratosis: Secondary | ICD-10-CM | POA: Diagnosis not present

## 2017-07-15 DIAGNOSIS — L821 Other seborrheic keratosis: Secondary | ICD-10-CM | POA: Diagnosis not present

## 2017-07-15 DIAGNOSIS — Z85828 Personal history of other malignant neoplasm of skin: Secondary | ICD-10-CM | POA: Diagnosis not present

## 2017-08-04 DIAGNOSIS — R972 Elevated prostate specific antigen [PSA]: Secondary | ICD-10-CM | POA: Diagnosis not present

## 2017-08-11 DIAGNOSIS — R972 Elevated prostate specific antigen [PSA]: Secondary | ICD-10-CM | POA: Diagnosis not present

## 2017-08-25 DIAGNOSIS — M5440 Lumbago with sciatica, unspecified side: Secondary | ICD-10-CM | POA: Diagnosis not present

## 2017-08-25 DIAGNOSIS — M4155 Other secondary scoliosis, thoracolumbar region: Secondary | ICD-10-CM | POA: Diagnosis not present

## 2017-08-25 DIAGNOSIS — M47816 Spondylosis without myelopathy or radiculopathy, lumbar region: Secondary | ICD-10-CM | POA: Diagnosis not present

## 2017-08-25 DIAGNOSIS — M48062 Spinal stenosis, lumbar region with neurogenic claudication: Secondary | ICD-10-CM | POA: Diagnosis not present

## 2017-08-25 DIAGNOSIS — M5136 Other intervertebral disc degeneration, lumbar region: Secondary | ICD-10-CM | POA: Diagnosis not present

## 2017-09-02 DIAGNOSIS — R972 Elevated prostate specific antigen [PSA]: Secondary | ICD-10-CM | POA: Diagnosis not present

## 2017-09-09 ENCOUNTER — Other Ambulatory Visit: Payer: Self-pay | Admitting: Neurosurgery

## 2017-09-09 DIAGNOSIS — M48062 Spinal stenosis, lumbar region with neurogenic claudication: Secondary | ICD-10-CM | POA: Diagnosis not present

## 2017-09-09 DIAGNOSIS — M5136 Other intervertebral disc degeneration, lumbar region: Secondary | ICD-10-CM | POA: Diagnosis not present

## 2017-09-09 DIAGNOSIS — M5416 Radiculopathy, lumbar region: Secondary | ICD-10-CM | POA: Diagnosis not present

## 2017-09-09 DIAGNOSIS — M5440 Lumbago with sciatica, unspecified side: Secondary | ICD-10-CM | POA: Diagnosis not present

## 2017-09-22 ENCOUNTER — Encounter: Payer: Self-pay | Admitting: Physical Therapy

## 2017-09-22 ENCOUNTER — Ambulatory Visit: Payer: PPO | Attending: Neurosurgery | Admitting: Physical Therapy

## 2017-09-22 DIAGNOSIS — M6281 Muscle weakness (generalized): Secondary | ICD-10-CM | POA: Insufficient documentation

## 2017-09-22 DIAGNOSIS — G8929 Other chronic pain: Secondary | ICD-10-CM | POA: Insufficient documentation

## 2017-09-22 DIAGNOSIS — M5441 Lumbago with sciatica, right side: Secondary | ICD-10-CM | POA: Diagnosis not present

## 2017-09-22 DIAGNOSIS — R2689 Other abnormalities of gait and mobility: Secondary | ICD-10-CM | POA: Diagnosis not present

## 2017-09-22 NOTE — Patient Instructions (Signed)
Access Code: [email or web address]  URL: [email or web address]  Date: 09/22/2017  Prepared by: Faustino Congress   Exercises  Supine Posterior Pelvic Tilt - 10 reps - 1 sets - 5 sec hold - 2x daily - 7x weekly  Supine Piriformis Stretch with Foot on Ground - 3 reps - 1 sets - 30 sec hold - 2x daily - 7x weekly

## 2017-09-22 NOTE — Therapy (Signed)
[facility] [address], [place], [place], [address] Phone: [phone]   Fax:  [phone]  Physical Therapy Evaluation  Patient Details  Name: Bryan [name] MRN: [id number] Date of Birth: 10/05/1945 Referring Provider: Dr. Jovita Gamma   Encounter Date: 09/22/2017  PT End of Session - 09/22/17 1057    Visit Number  1    Number of Visits  12    Date for PT Re-Evaluation  11/03/17    Authorization Type  [employer]    PT Start Time  1016    PT Stop Time  1054    PT Time Calculation (min)  38 min    Activity Tolerance  Patient tolerated treatment well;No increased pain    Behavior During Therapy  WFL for tasks assessed/performed       Past Medical History:  Diagnosis Date  . Hypertension   . Osteoarthritis   . Spinal stenosis     Past Surgical History:  Procedure Laterality Date  . ACHILLES TENDON SURGERY  02/2006  . COLONOSCOPY  2003  . COLONOSCOPY    . KNEE SURGERY     X2  . SPINE SURGERY  12-08, 5-10  . TOTAL HIP ARTHROPLASTY      There were no vitals filed for this visit.   Subjective Assessment - 09/22/17 1021    Subjective  Pt is a 72 y/o male who presents to OPPT for LBP.  Pt with known spinal stenosis L2-L5 with planned 3 level XLIF which has since been canceled.  Pt report progressive worsening of symptoms into RLE since fall 2018.  Pt presents today reporting insurance has denied surgery until he tries PT.  Pt open to PT and avoiding surgery if possible.    Limitations  Standing;Walking    How long can you stand comfortably?  immediately uncomfortable, up to 5 min    How long can you walk comfortably?  5 min    Diagnostic tests  MRI: L2-5 spinal stenosis    Patient Stated Goals  improve pain, postpone surgery as long as possible    Currently in Pain?  Yes    Pain Score  7  up to 8-9/10; at best 5-6/10    Pain Location  Back    Pain Orientation  Right;Lower    Pain  Descriptors / Indicators  Aching    Pain Type  Chronic pain    Pain Radiating Towards  RLE to knee    Pain Onset  More than a month ago    Pain Frequency  Constant    Aggravating Factors   standing, walking    Pain Relieving Factors  medication, swimming         [facility] PT Assessment - 09/22/17 1028      Assessment   Medical Diagnosis  LBP, spinal stenosis    Referring Provider  Dr. Jovita Gamma    Onset Date/Surgical Date  -- chronic; worse since Oct 2018    Prior Therapy  n/a      Precautions   Precautions  None      Restrictions   Weight Bearing Restrictions  No      Balance Screen   Has the patient fallen in the past 6 months  No    Has the patient had a decrease in activity level because of a fear of falling?   No    Is the patient reluctant to leave their home because of a fear of falling?  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[employer], golfing      Cognition   Overall Cognitive Status  Within Functional Limits for tasks assessed      Observation/Other Assessments   Focus on Therapeutic Outcomes (FOTO)   42 (58% limited; predicted 48% limited)      Posture/Postural Control   Posture/Postural Control  Postural limitations    Postural Limitations  Rounded Shoulders;Forward head;Decreased lumbar lordosis      ROM / Strength   AROM / PROM / Strength  AROM;Strength      AROM   AROM Assessment Site  Lumbar    Lumbar Flexion  43    Lumbar Extension  10    Lumbar - Right Side Bend  26    Lumbar - Left Side Bend  16       Strength   Strength Assessment Site  Hip;Knee;Ankle    Right/Left Hip  Right;Left    Right Hip Flexion  3/5    Right Hip Extension  3-/5    Right Hip ABduction  3+/5    Left Hip Flexion  5/5    Left Hip Extension  4/5    Left Hip ABduction  4/5    Right/Left Knee  Right;Left    Right Knee Flexion  4/5    Right Knee Extension  4/5    Left Knee Flexion  5/5    Left Knee Extension  5/5    Right/Left Ankle  Right;Left    Right Ankle Dorsiflexion  4/5    Left Ankle Dorsiflexion  5/5      Flexibility   Soft Tissue Assessment /Muscle Length  yes hip flexor tightness bil    Hamstrings  tightness bil    Piriformis  tightness bil      Palpation   Spinal mobility  hypomobile lumbar spine    Palpation comment  no significant tenderness with palpation      Special Tests    Special Tests  Lumbar    Lumbar Tests  Straight Leg Raise      Straight Leg Raise   Findings  Negative      Ambulation/Gait   Gait Pattern  Antalgic;Lateral trunk lean to right                Objective measurements completed on examination: See above findings.      [place] Adult PT Treatment/Exercise - 09/22/17 1028      Exercises   Exercises  Other Exercises    Other Exercises   instructed in HEP to include pelvic tilts and piriformis stretch: mod cues needed             PT Education - 09/22/17 1057    Education provided  Yes    Education Details  initiated HEP    Person(s) Educated  Patient    Methods  Explanation;Demonstration;Handout;Verbal cues;Tactile cues  Comprehension  Verbalized understanding;Returned demonstration;Verbal cues required;Tactile cues required;Need further instruction          PT Long Term Goals - 09/22/17 1357      PT LONG TERM GOAL #1   Title  independent with HEP    Status  New    Target Date  11/03/17      PT LONG TERM GOAL #2   Title  report ability to stand > 15 min without increase in pain for improved function    Status  New    Target Date   11/03/17      PT LONG TERM GOAL #3   Title  verbalize understanding of posture/body mechanics to decrease risk of reinjury    Status  New    Target Date  11/03/17      PT LONG TERM GOAL #4   Title  demonstrate 4/5 RLE strength for improved strength and function    Status  New    Target Date  11/03/17             Plan - 09/22/17 1054    Clinical Impression Statement  Pt is a 72 y/o male who presents to OPPT for LBP with known spinal stenosis from L2-5.  Pt demonstrates increased pain, decreased strength and ROM as well as decreased flexibility affecting functional mobility.  Pt will benefit from PT to address deficits listed.  Pt is hopeful to be able to improve symptoms (especially into RLE) and defer surgery if able.    History and Personal Factors relevant to plan of care:  HTN, OA, spinal stenosis    Clinical Presentation  Evolving    Clinical Presentation due to:  pain evolving into RLE    Clinical Decision Making  Moderate    Rehab Potential  Good    PT Frequency  2x / week    PT Duration  6 weeks    PT Treatment/Interventions  ADLs/Self Care Home Management;Cryotherapy;Electrical Stimulation;Moist Heat;Therapeutic exercise;Therapeutic activities;Traction;Functional mobility training;Stair training;Gait training;Ultrasound;Patient/family education;Manual techniques;Taping;Dry needling    PT Next Visit Plan  review HEP and add HS/HF stretches, progress core stability and hip strengthening, modalities PRN (pt asking about estim)    PT Home Exercise Plan  Access Code: [email or web address]       Patient will benefit from skilled therapeutic intervention in order to improve the following deficits and impairments:  Abnormal gait, Hypomobility, Decreased strength, Pain, Increased muscle spasms, Difficulty walking, Decreased mobility, Decreased range of motion, Improper body mechanics, Postural dysfunction, Impaired flexibility  Visit Diagnosis: Chronic right-sided low back pain with  right-sided sciatica - Plan: PT plan of care cert/re-cert  Muscle weakness (generalized) - Plan: PT plan of care cert/re-cert  Other abnormalities of gait and mobility - Plan: PT plan of care cert/re-cert     Problem List Patient Active Problem List   Diagnosis Date Noted  . PROSTATE SPECIFIC ANTIGEN, ELEVATED 07/19/2009  . BACK PAIN 06/11/2007  . Essential hypertension 05/21/2007  . Osteoarthritis 05/21/2007      Laureen Abrahams, PT, DPT 09/22/17 2:00 PM    [facility] [address], [place], [place], [address] Phone: [phone]   Fax:  [phone]  Name: BERTIE SIMIEN MRN: [id number] Date of Birth: 11/20/1945

## 2017-09-24 ENCOUNTER — Encounter: Payer: Self-pay | Admitting: Physical Therapy

## 2017-09-24 ENCOUNTER — Ambulatory Visit: Payer: PPO | Admitting: Physical Therapy

## 2017-09-24 DIAGNOSIS — M5441 Lumbago with sciatica, right side: Principal | ICD-10-CM

## 2017-09-24 DIAGNOSIS — G8929 Other chronic pain: Secondary | ICD-10-CM

## 2017-09-24 DIAGNOSIS — R2689 Other abnormalities of gait and mobility: Secondary | ICD-10-CM

## 2017-09-24 DIAGNOSIS — M6281 Muscle weakness (generalized): Secondary | ICD-10-CM

## 2017-09-24 NOTE — Patient Instructions (Signed)
Access Code: [email or web address]  URL: [email or web address]  Date: 09/24/2017  Prepared by: Faustino Congress   Exercises  Supine Posterior Pelvic Tilt - 10 reps - 1 sets - 5 sec hold - 2x daily - 7x weekly  Supine Piriformis Stretch with Foot on Ground - 3 reps - 1 sets - 30 sec hold - 2x daily - 7x weekly  Hooklying Single Knee to Chest - 3 reps - 1 sets - 30 sec hold - 2x daily - 7x weekly  Supine Lower Trunk Rotation - 3 reps - 1 sets - 30 sec hold - 2x daily - 7x weekly  Hooklying Clamshell with Resistance - 20 reps - 1 sets - 2x daily - 7x weekly

## 2017-09-24 NOTE — Therapy (Signed)
[facility] [address], [place], [place], [address] Phone: [phone]   Fax:  [phone]  Physical Therapy Treatment  Patient Details  Name: Bryan [name] MRN: [id number] Date of Birth: 1945-10-08 Referring Provider: Dr. Jovita Gamma   Encounter Date: 09/24/2017  PT End of Session - 09/24/17 0959    Visit Number  2    Number of Visits  12    Date for PT Re-Evaluation  11/03/17    Authorization Type  [employer]    PT Start Time  0920    PT Stop Time  0959    PT Time Calculation (min)  39 min    Activity Tolerance  Patient tolerated treatment well;No increased pain    Behavior During Therapy  WFL for tasks assessed/performed       Past Medical History:  Diagnosis Date  . Hypertension   . Osteoarthritis   . Spinal stenosis     Past Surgical History:  Procedure Laterality Date  . ACHILLES TENDON SURGERY  02/2006  . COLONOSCOPY  2003  . COLONOSCOPY    . KNEE SURGERY     X2  . SPINE SURGERY  12-08, 5-10  . TOTAL HIP ARTHROPLASTY      There were no vitals filed for this visit.  Subjective Assessment - 09/24/17 0922    Subjective  no new changes; back is still hurting    Patient Stated Goals  improve pain, postpone surgery as long as possible    Currently in Pain?  Yes    Pain Score  4     Pain Location  Back    Pain Orientation  Right;Lower    Pain Descriptors / Indicators  Aching    Pain Type  Chronic pain    Pain Radiating Towards  RLE to knee    Pain Onset  More than a month ago    Pain Frequency  Constant    Aggravating Factors   standing, walking    Pain Relieving Factors  medication, swimming                       [facility] Adult PT Treatment/Exercise - 09/24/17 0924      Exercises   Exercises  Lumbar      Lumbar Exercises: Stretches   Single Knee to Chest Stretch  Right;Left;3 reps;30 seconds    Lower Trunk Rotation  3 reps;30 seconds    Piriformis  Stretch  Right;Left;3 reps;30 seconds      Lumbar Exercises: Aerobic   Nustep  L3 x 6 min      Lumbar Exercises: Supine   Pelvic Tilt  10 reps;5 seconds    Clam  20 reps;2 seconds    Clam Limitations  with green theraband             PT Education - 09/24/17 0958    Education provided  Yes    Education Details  HEP    Person(s) Educated  Patient    Methods  Explanation;Demonstration;Handout    Comprehension  Verbalized understanding;Returned demonstration;Need further instruction          PT Long Term Goals - 09/22/17 1357      PT LONG TERM GOAL #1   Title  independent with HEP    Status  New    Target Date  11/03/17      PT LONG TERM GOAL #2   Title  report ability to stand > 15 min  without increase in pain for improved function    Status  New    Target Date  11/03/17      PT LONG TERM GOAL #3   Title  verbalize understanding of posture/body mechanics to decrease risk of reinjury    Status  New    Target Date  11/03/17      PT LONG TERM GOAL #4   Title  demonstrate 4/5 RLE strength for improved strength and function    Status  New    Target Date  11/03/17            Plan - 09/24/17 0959    Clinical Impression Statement  Pt tolerated session well today without increase in pain.  Min cues needed with current HEP and added to HEP today needing cues for proper form.  Will continue to benefit from PT to maximize function.    Rehab Potential  Good    PT Frequency  2x / week    PT Duration  6 weeks    PT Treatment/Interventions  ADLs/Self Care Home Management;Cryotherapy;Electrical Stimulation;Moist Heat;Therapeutic exercise;Therapeutic activities;Traction;Functional mobility training;Stair training;Gait training;Ultrasound;Patient/family education;Manual techniques;Taping;Dry needling    PT Next Visit Plan  review HEP and add HS/HF stretches, progress core stability and hip strengthening, modalities PRN (pt asking about estim)    PT Home Exercise Plan   Access Code: [email or web address]       Patient will benefit from skilled therapeutic intervention in order to improve the following deficits and impairments:  Abnormal gait, Hypomobility, Decreased strength, Pain, Increased muscle spasms, Difficulty walking, Decreased mobility, Decreased range of motion, Improper body mechanics, Postural dysfunction, Impaired flexibility  Visit Diagnosis: Chronic right-sided low back pain with right-sided sciatica  Muscle weakness (generalized)  Other abnormalities of gait and mobility     Problem List Patient Active Problem List   Diagnosis Date Noted  . PROSTATE SPECIFIC ANTIGEN, ELEVATED 07/19/2009  . BACK PAIN 06/11/2007  . Essential hypertension 05/21/2007  . Osteoarthritis 05/21/2007      Laureen Abrahams, PT, DPT 09/24/17 10:09 AM    [facility] [address] [place], [place], [address] Phone: [phone]   Fax:  [phone]  Name: Bryan [name] MRN: [id number] Date of Birth: 1945-09-21

## 2017-09-29 ENCOUNTER — Encounter: Payer: Self-pay | Admitting: Physical Therapy

## 2017-09-29 ENCOUNTER — Ambulatory Visit: Payer: PPO | Admitting: Physical Therapy

## 2017-09-29 DIAGNOSIS — G8929 Other chronic pain: Secondary | ICD-10-CM

## 2017-09-29 DIAGNOSIS — M6281 Muscle weakness (generalized): Secondary | ICD-10-CM

## 2017-09-29 DIAGNOSIS — M5441 Lumbago with sciatica, right side: Principal | ICD-10-CM

## 2017-09-29 DIAGNOSIS — R2689 Other abnormalities of gait and mobility: Secondary | ICD-10-CM

## 2017-09-29 NOTE — Therapy (Signed)
[facility] [address], [place], [place], [address] Phone: [phone]   Fax:  [phone]  Physical Therapy Treatment  Patient Details  Name: Bryan [name] MRN: [id number] Date of Birth: 1946-05-24 Referring Provider: Dr. Jovita Gamma   Encounter Date: 09/29/2017  PT End of Session - 09/29/17 1042    Visit Number  3    Number of Visits  12    Date for PT Re-Evaluation  11/03/17    Authorization Type  [employer]    PT Start Time  1013    PT Stop Time  1055    PT Time Calculation (min)  42 min    Activity Tolerance  Patient tolerated treatment well;No increased pain    Behavior During Therapy  WFL for tasks assessed/performed       Past Medical History:  Diagnosis Date  . Hypertension   . Osteoarthritis   . Spinal stenosis     Past Surgical History:  Procedure Laterality Date  . ACHILLES TENDON SURGERY  02/2006  . COLONOSCOPY  2003  . COLONOSCOPY    . KNEE SURGERY     X2  . SPINE SURGERY  12-08, 5-10  . TOTAL HIP ARTHROPLASTY      There were no vitals filed for this visit.  Subjective Assessment - 09/29/17 1016    Subjective  doing exercises, new ones seem to aggravate back    Patient Stated Goals  improve pain, postpone surgery as long as possible    Currently in Pain?  Yes    Pain Score  8     Pain Location  Back    Pain Orientation  Right;Lower    Pain Descriptors / Indicators  Aching    Pain Type  Chronic pain    Pain Radiating Towards  RLE to knee    Pain Onset  More than a month ago    Pain Frequency  Constant    Aggravating Factors   standing, walking    Pain Relieving Factors  medication, swimming                       [facility] Adult PT Treatment/Exercise - 09/29/17 1018      Lumbar Exercises: Stretches   Passive Hamstring Stretch  Right;Left;3 reps;30 seconds seated    Single Knee to Chest Stretch  Right;Left;3 reps;30 seconds    Lower Trunk Rotation   3 reps;30 seconds      Lumbar Exercises: Supine   Pelvic Tilt  10 reps;5 seconds    Bent Knee Raise  10 reps      Modalities   Modalities  Electrical Stimulation;Moist Heat      Moist Heat Therapy   Number Minutes Moist Heat  15 Minutes    Moist Heat Location  Hip Rt      Electrical Stimulation   Electrical Stimulation Location  Rt lateral hip    Electrical Stimulation Action  IFC    Electrical Stimulation Parameters  to tolerance x 15 min    Electrical Stimulation Goals  Pain                  PT Long Term Goals - 09/22/17 1357      PT LONG TERM GOAL #1   Title  independent with HEP    Status  New    Target Date  11/03/17      PT LONG TERM GOAL #2   Title  report ability  to stand > 15 min without increase in pain for improved function    Status  New    Target Date  11/03/17      PT LONG TERM GOAL #3   Title  verbalize understanding of posture/body mechanics to decrease risk of reinjury    Status  New    Target Date  11/03/17      PT LONG TERM GOAL #4   Title  demonstrate 4/5 RLE strength for improved strength and function    Status  New    Target Date  11/03/17            Plan - 09/29/17 1043    Clinical Impression Statement  Pt arrives to PT today with elevated pain levels and poor tolerance to HEP and exercises in clinic, which are low level mat exercises.  Pain at this time seems to be worsening so trialed estim today to see if pt can get some relief.      Rehab Potential  Good    PT Frequency  2x / week    PT Duration  6 weeks    PT Treatment/Interventions  ADLs/Self Care Home Management;Cryotherapy;Electrical Stimulation;Moist Heat;Therapeutic exercise;Therapeutic activities;Traction;Functional mobility training;Stair training;Gait training;Ultrasound;Patient/family education;Manual techniques;Taping;Dry needling    PT Next Visit Plan  see how estim was, HS/HF stretches, progress core stability and hip strengthening, modalities PRN    PT Home  Exercise Plan  Access Code: [email or web address]       Patient will benefit from skilled therapeutic intervention in order to improve the following deficits and impairments:  Abnormal gait, Hypomobility, Decreased strength, Pain, Increased muscle spasms, Difficulty walking, Decreased mobility, Decreased range of motion, Improper body mechanics, Postural dysfunction, Impaired flexibility  Visit Diagnosis: Chronic right-sided low back pain with right-sided sciatica  Muscle weakness (generalized)  Other abnormalities of gait and mobility     Problem List Patient Active Problem List   Diagnosis Date Noted  . PROSTATE SPECIFIC ANTIGEN, ELEVATED 07/19/2009  . BACK PAIN 06/11/2007  . Essential hypertension 05/21/2007  . Osteoarthritis 05/21/2007      Laureen Abrahams, PT, DPT 09/29/17 10:45 AM    [facility] [address] [place], [place], [address] Phone: [phone]   Fax:  [phone]  Name: Bryan [name] MRN: [id number] Date of Birth: [birth year]

## 2017-10-01 ENCOUNTER — Encounter: Payer: Self-pay | Admitting: Physical Therapy

## 2017-10-01 ENCOUNTER — Ambulatory Visit: Payer: PPO | Admitting: Physical Therapy

## 2017-10-01 DIAGNOSIS — M6281 Muscle weakness (generalized): Secondary | ICD-10-CM

## 2017-10-01 DIAGNOSIS — M5441 Lumbago with sciatica, right side: Secondary | ICD-10-CM | POA: Diagnosis not present

## 2017-10-01 DIAGNOSIS — R2689 Other abnormalities of gait and mobility: Secondary | ICD-10-CM

## 2017-10-01 DIAGNOSIS — G8929 Other chronic pain: Secondary | ICD-10-CM

## 2017-10-01 NOTE — Therapy (Signed)
[facility] [address] [place], [place], [address] Phone: [phone]   Fax:  [phone]  Physical Therapy Treatment  Patient Details  Name: Bryan [name] MRN: [id number] Date of Birth: 03-16-1946 Referring Provider: Dr. Jovita Gamma   Encounter Date: 10/01/2017  PT End of Session - 10/01/17 1122    Visit Number  4    Number of Visits  12    Date for PT Re-Evaluation  11/03/17    Authorization Type  [employer]    PT Start Time  1102    PT Stop Time  1140    PT Time Calculation (min)  38 min    Activity Tolerance  Patient tolerated treatment well;No increased pain    Behavior During Therapy  WFL for tasks assessed/performed       Past Medical History:  Diagnosis Date  . Hypertension   . Osteoarthritis   . Spinal stenosis     Past Surgical History:  Procedure Laterality Date  . ACHILLES TENDON SURGERY  02/2006  . COLONOSCOPY  2003  . COLONOSCOPY    . KNEE SURGERY     X2  . SPINE SURGERY  12-08, 5-10  . TOTAL HIP ARTHROPLASTY      There were no vitals filed for this visit.  Subjective Assessment - 10/01/17 1336    Subjective  stretches aggravate my back, stim did not help.  I'll do whatever you want.    Limitations  Standing;Walking    How long can you stand comfortably?  immediately uncomfortable, up to 5 min    How long can you walk comfortably?  5 min    Diagnostic tests  MRI: L2-5 spinal stenosis    Patient Stated Goals  improve pain, postpone surgery as long as possible    Currently in Pain?  Yes    Pain Score  8     Pain Location  Back    Pain Orientation  Right;Lower    Pain Descriptors / Indicators  Aching    Pain Type  Chronic pain    Pain Radiating Towards  RLE to knee    Pain Onset  More than a month ago    Pain Frequency  Constant    Aggravating Factors   standing and walking    Pain Relieving Factors  medication, swimming    Multiple Pain Sites  No                        [facility] Adult PT Treatment/Exercise - 10/01/17 0001      Neuro Re-ed    Neuro Re-ed Details   diaphragmatic breathing and TrA activation in supine      Moist Heat Therapy   Number Minutes Moist Heat  15 Minutes    Moist Heat Location  Lumbar Spine      Electrical Stimulation   Electrical Stimulation Location  Rt lumbar spine    Electrical Stimulation Action  IFC    Electrical Stimulation Parameters  to tolerance x 15 min    Electrical Stimulation Goals  Pain                  PT Long Term Goals - 10/01/17 1122      PT LONG TERM GOAL #1   Title  independent with HEP    Status  On-going      PT LONG TERM GOAL #2   Title  report ability to stand > 15  min without increase in pain for improved function    Status  On-going      PT LONG TERM GOAL #3   Title  verbalize understanding of posture/body mechanics to decrease risk of reinjury    Status  On-going      PT LONG TERM GOAL #4   Title  demonstrate 4/5 RLE strength for improved strength and function    Status  On-going            Plan - 10/01/17 1332    Clinical Impression Statement  Pt reports no change in status today and reports increased pain with stretches.  Pt was complaining of pain even with stim and needed to have intensity turned down several times thoughout treatment.  He had some relief from hot pack during application, but no change in pain after treatment.  Pt needed cues for greater ribcage movement during functional diaphragmatic breathing training.  Pt will benefit from PT to address core strength and abdominal muscle coordination in order to improve function or surgical outcomes if he does end up needed surgical intervention.    PT Treatment/Interventions  ADLs/Self Care Home Management;Cryotherapy;Electrical Stimulation;Moist Heat;Therapeutic exercise;Therapeutic activities;Traction;Functional mobility training;Stair training;Gait  training;Ultrasound;Patient/family education;Manual techniques;Taping;Dry needling    PT Next Visit Plan  see how estim was, HS/HF stretches, progress core stability and hip strengthening, modalities PRN       Patient will benefit from skilled therapeutic intervention in order to improve the following deficits and impairments:  Abnormal gait, Hypomobility, Decreased strength, Pain, Increased muscle spasms, Difficulty walking, Decreased mobility, Decreased range of motion, Improper body mechanics, Postural dysfunction, Impaired flexibility  Visit Diagnosis: Chronic right-sided low back pain with right-sided sciatica  Muscle weakness (generalized)  Other abnormalities of gait and mobility     Problem List Patient Active Problem List   Diagnosis Date Noted  . PROSTATE SPECIFIC ANTIGEN, ELEVATED 07/19/2009  . BACK PAIN 06/11/2007  . Essential hypertension 05/21/2007  . Osteoarthritis 05/21/2007    Zannie Cove, PT 10/01/2017, 1:41 PM   [facility] [address], [place], [place], [address] Phone: [phone]   Fax:  [phone]  Name: Bryan [name] MRN: [id number] Date of Birth: 01/18/1946

## 2017-10-05 ENCOUNTER — Other Ambulatory Visit (HOSPITAL_COMMUNITY): Payer: PPO

## 2017-10-06 ENCOUNTER — Encounter: Payer: Self-pay | Admitting: Physical Therapy

## 2017-10-06 ENCOUNTER — Ambulatory Visit: Payer: PPO | Admitting: Physical Therapy

## 2017-10-06 DIAGNOSIS — M6281 Muscle weakness (generalized): Secondary | ICD-10-CM

## 2017-10-06 DIAGNOSIS — M5441 Lumbago with sciatica, right side: Principal | ICD-10-CM

## 2017-10-06 DIAGNOSIS — G8929 Other chronic pain: Secondary | ICD-10-CM

## 2017-10-06 DIAGNOSIS — R2689 Other abnormalities of gait and mobility: Secondary | ICD-10-CM

## 2017-10-06 NOTE — Therapy (Signed)
[facility] [address], [place], [place], [address] Phone: [phone]   Fax:  [phone]  Physical Therapy Treatment  Patient Details  Name: Bryan [name] MRN: [id number] Date of Birth: Sep 07, 1945 Referring Provider: Dr. Jovita Gamma   Encounter Date: 10/06/2017  PT End of Session - 10/06/17 1041    Visit Number  5    Number of Visits  12    Date for PT Re-Evaluation  11/03/17    Authorization Type  [employer]    PT Start Time  1015    PT Stop Time  1055    PT Time Calculation (min)  40 min    Activity Tolerance  Patient tolerated treatment well;No increased pain    Behavior During Therapy  WFL for tasks assessed/performed       Past Medical History:  Diagnosis Date  . Hypertension   . Osteoarthritis   . Spinal stenosis     Past Surgical History:  Procedure Laterality Date  . ACHILLES TENDON SURGERY  02/2006  . COLONOSCOPY  2003  . COLONOSCOPY    . KNEE SURGERY     X2  . SPINE SURGERY  12-08, 5-10  . TOTAL HIP ARTHROPLASTY      There were no vitals filed for this visit.  Subjective Assessment - 10/06/17 1015    Subjective  feels no better, feels worse.  "It's not because of PT, I'm just getting worse."    Patient Stated Goals  improve pain, postpone surgery as long as possible    Currently in Pain?  Yes    Pain Score  8     Pain Location  Back    Pain Orientation  Right;Lower    Pain Descriptors / Indicators  Aching    Pain Type  Chronic pain    Pain Onset  More than a month ago    Pain Frequency  Constant    Aggravating Factors   standing and walking    Pain Relieving Factors  medication, swimming                       [facility] Adult PT Treatment/Exercise - 10/06/17 1020      Lumbar Exercises: Stretches   Passive Hamstring Stretch  Right;Left;3 reps;30 seconds      Lumbar Exercises: Standing   Row  Both;20 reps 30#, 25#    Shoulder Extension  Both;20 reps  20#      Lumbar Exercises: Seated   Other Seated Lumbar Exercises  marching x20 with TrA      Modalities   Modalities  Electrical Stimulation;Cryotherapy      Cryotherapy   Number Minutes Cryotherapy  15 Minutes    Cryotherapy Location  Cervical    Type of Cryotherapy  Ice pack      Electrical Stimulation   Electrical Stimulation Location  Rt lumbar spine    Electrical Stimulation Action  IFC    Electrical Stimulation Parameters  to tolerance x 15 min    Electrical Stimulation Goals  Pain                  PT Long Term Goals - 10/01/17 1122      PT LONG TERM GOAL #1   Title  independent with HEP    Status  On-going      PT LONG TERM GOAL #2   Title  report ability to stand > 15 min without increase in pain for  improved function    Status  On-going      PT LONG TERM GOAL #3   Title  verbalize understanding of posture/body mechanics to decrease risk of reinjury    Status  On-going      PT LONG TERM GOAL #4   Title  demonstrate 4/5 RLE strength for improved strength and function    Status  On-going            Plan - 10/06/17 1042    Clinical Impression Statement  Pt presents today with continued pain and no improvement in symptoms, and reporting pain is getting worse.  Trialed seated and standing exercises today but all activities increase pain.  Pt has called MD and has follow up appt 11/04/17.      PT Treatment/Interventions  ADLs/Self Care Home Management;Cryotherapy;Electrical Stimulation;Moist Heat;Therapeutic exercise;Therapeutic activities;Traction;Functional mobility training;Stair training;Gait training;Ultrasound;Patient/family education;Manual techniques;Taping;Dry needling    PT Next Visit Plan  see how estim was, HS/HF stretches, progress core stability and hip strengthening, modalities PRN    PT Home Exercise Plan  Access Code: [email or web address]       Patient will benefit from skilled therapeutic intervention in order to improve the following deficits  and impairments:  Abnormal gait, Hypomobility, Decreased strength, Pain, Increased muscle spasms, Difficulty walking, Decreased mobility, Decreased range of motion, Improper body mechanics, Postural dysfunction, Impaired flexibility  Visit Diagnosis: Chronic right-sided low back pain with right-sided sciatica  Muscle weakness (generalized)  Other abnormalities of gait and mobility     Problem List Patient Active Problem List   Diagnosis Date Noted  . PROSTATE SPECIFIC ANTIGEN, ELEVATED 07/19/2009  . BACK PAIN 06/11/2007  . Essential hypertension 05/21/2007  . Osteoarthritis 05/21/2007      Laureen Abrahams, PT, DPT 10/06/17 10:44 AM    [place] [facility] [address], [place], [place], [address] Phone: [phone]   Fax:  [phone]  Name: Bryan [name] MRN: [id number] Date of Birth: 25-Jan-1946

## 2017-10-08 ENCOUNTER — Ambulatory Visit: Payer: PPO | Admitting: Physical Therapy

## 2017-10-08 ENCOUNTER — Encounter: Payer: Self-pay | Admitting: Physical Therapy

## 2017-10-08 DIAGNOSIS — M5441 Lumbago with sciatica, right side: Secondary | ICD-10-CM | POA: Diagnosis not present

## 2017-10-08 DIAGNOSIS — M6281 Muscle weakness (generalized): Secondary | ICD-10-CM

## 2017-10-08 DIAGNOSIS — G8929 Other chronic pain: Secondary | ICD-10-CM

## 2017-10-08 DIAGNOSIS — R2689 Other abnormalities of gait and mobility: Secondary | ICD-10-CM

## 2017-10-08 NOTE — Therapy (Signed)
[facility] [address], [place], [place], [address] Phone: [phone]   Fax:  [phone]  Physical Therapy Treatment  Patient Details  Name: Bryan [name] MRN: [id number] Date of Birth: April 15, 1946 Referring Provider: Dr. Jovita Gamma   Encounter Date: 10/08/2017  PT End of Session - 10/08/17 0853    Visit Number  6    Number of Visits  12    Date for PT Re-Evaluation  11/03/17    Authorization Type  [employer]    PT Start Time  0830    PT Stop Time  0905    PT Time Calculation (min)  35 min    Activity Tolerance  Patient limited by pain    Behavior During Therapy  [facility] for tasks assessed/performed       Past Medical History:  Diagnosis Date  . Hypertension   . Osteoarthritis   . Spinal stenosis     Past Surgical History:  Procedure Laterality Date  . ACHILLES TENDON SURGERY  02/2006  . COLONOSCOPY  2003  . COLONOSCOPY    . KNEE SURGERY     X2  . SPINE SURGERY  12-08, 5-10  . TOTAL HIP ARTHROPLASTY      There were no vitals filed for this visit.  Subjective Assessment - 10/08/17 0833    Subjective  "I'm miserable"    Patient Stated Goals  improve pain, postpone surgery as long as possible    Currently in Pain?  Yes    Pain Score  8     Pain Location  Back    Pain Orientation  Right;Lower    Pain Descriptors / Indicators  Aching    Pain Type  Chronic pain    Pain Onset  More than a month ago    Pain Frequency  Constant    Aggravating Factors   standing and walking    Pain Relieving Factors  medication, swimming                       [facility] Adult PT Treatment/Exercise - 10/08/17 0834      Exercises   Exercises  Knee/Hip      Lumbar Exercises: Stretches   Single Knee to Chest Stretch  Right;Left;3 reps;30 seconds    Lower Trunk Rotation  3 reps;30 seconds    Piriformis Stretch  Right;Left;3 reps;30 seconds      Lumbar Exercises: Standing   Heel Raises  15  reps    Functional Squats  -- attempted; pt unable      Lumbar Exercises: Supine   Pelvic Tilt  10 reps;5 seconds      Knee/Hip Exercises: Standing   Hip Extension  Both;15 reps;Knee straight      Cryotherapy   Number Minutes Cryotherapy  15 Minutes    Cryotherapy Location  Lumbar Spine    Type of Cryotherapy  Ice pack      Electrical Stimulation   Electrical Stimulation Location  Rt lumbar spine    Electrical Stimulation Action  IFC    Electrical Stimulation Parameters  to tolerance x 15 min    Electrical Stimulation Goals  Pain                  PT Long Term Goals - 10/01/17 1122      PT LONG TERM GOAL #1   Title  independent with HEP    Status  On-going      PT LONG  TERM GOAL #2   Title  report ability to stand > 15 min without increase in pain for improved function    Status  On-going      PT LONG TERM GOAL #3   Title  verbalize understanding of posture/body mechanics to decrease risk of reinjury    Status  On-going      PT LONG TERM GOAL #4   Title  demonstrate 4/5 RLE strength for improved strength and function    Status  On-going            Plan - 10/08/17 1027    Clinical Impression Statement  Pt with min tolerance to exercises due to increasing pain at this time.  Able to tolerate a few new standing exercises but pain continues to be elevated without relief.      PT Treatment/Interventions  ADLs/Self Care Home Management;Cryotherapy;Electrical Stimulation;Moist Heat;Therapeutic exercise;Therapeutic activities;Traction;Functional mobility training;Stair training;Gait training;Ultrasound;Patient/family education;Manual techniques;Taping;Dry needling    PT Next Visit Plan  progress core stability and hip strengthening, modalities PRN, exercises as able    PT Home Exercise Plan  Access Code: [email or web address]    Consulted and Agree with Plan of Care  Patient       Patient will benefit from skilled therapeutic intervention in order to improve the following  deficits and impairments:  Abnormal gait, Hypomobility, Decreased strength, Pain, Increased muscle spasms, Difficulty walking, Decreased mobility, Decreased range of motion, Improper body mechanics, Postural dysfunction, Impaired flexibility  Visit Diagnosis: Chronic right-sided low back pain with right-sided sciatica  Muscle weakness (generalized)  Other abnormalities of gait and mobility     Problem List Patient Active Problem List   Diagnosis Date Noted  . PROSTATE SPECIFIC ANTIGEN, ELEVATED 07/19/2009  . BACK PAIN 06/11/2007  . Essential hypertension 05/21/2007  . Osteoarthritis 05/21/2007     Laureen Abrahams, PT, DPT 10/08/17 8:58 AM    [place] [facility] [address] [place], [place], [address] Phone: [phone]   Fax:  [phone]  Name: Bryan [name] MRN: [id number] Date of Birth: July 08, 1945

## 2017-10-12 ENCOUNTER — Inpatient Hospital Stay: Admit: 2017-10-12 | Payer: PPO | Admitting: Neurosurgery

## 2017-10-12 SURGERY — ANTERIOR LATERAL LUMBAR FUSION 3 LEVELS
Anesthesia: General

## 2017-10-13 ENCOUNTER — Encounter: Payer: Self-pay | Admitting: Physical Therapy

## 2017-10-13 ENCOUNTER — Ambulatory Visit: Payer: PPO | Admitting: Physical Therapy

## 2017-10-13 DIAGNOSIS — G8929 Other chronic pain: Secondary | ICD-10-CM

## 2017-10-13 DIAGNOSIS — M6281 Muscle weakness (generalized): Secondary | ICD-10-CM

## 2017-10-13 DIAGNOSIS — M5441 Lumbago with sciatica, right side: Secondary | ICD-10-CM | POA: Diagnosis not present

## 2017-10-13 DIAGNOSIS — R2689 Other abnormalities of gait and mobility: Secondary | ICD-10-CM

## 2017-10-13 NOTE — Therapy (Signed)
[facility] [address] [place], [place], [address] Phone: [phone]   Fax:  [phone]  Physical Therapy Treatment  Patient Details  Name: Bryan [name] MRN: [id number] Date of Birth: 09/12/1945 Referring Provider: Dr. Jovita Gamma   Encounter Date: 10/13/2017  PT End of Session - 10/13/17 1032    Visit Number  7    Number of Visits  12    Date for PT Re-Evaluation  11/03/17    Authorization Type  [employer]    PT Start Time  1002    PT Stop Time  1043    PT Time Calculation (min)  41 min    Activity Tolerance  Patient limited by pain    Behavior During Therapy  [facility] for tasks assessed/performed       Past Medical History:  Diagnosis Date  . Hypertension   . Osteoarthritis   . Spinal stenosis     Past Surgical History:  Procedure Laterality Date  . ACHILLES TENDON SURGERY  02/2006  . COLONOSCOPY  2003  . COLONOSCOPY    . KNEE SURGERY     X2  . SPINE SURGERY  12-08, 5-10  . TOTAL HIP ARTHROPLASTY      There were no vitals filed for this visit.  Subjective Assessment - 10/13/17 1002    Subjective  "I'm the same."    Patient Stated Goals  improve pain, postpone surgery as long as possible    Currently in Pain?  Yes    Pain Score  8     Pain Location  Back    Pain Orientation  Right;Lower    Pain Descriptors / Indicators  Aching    Pain Type  Chronic pain    Pain Radiating Towards  RLE to knee    Pain Onset  More than a month ago    Pain Frequency  Constant    Aggravating Factors   standing and walking    Pain Relieving Factors  medication, swimming                       [facility] Adult PT Treatment/Exercise - 10/13/17 1004      Lumbar Exercises: Stretches   Single Knee to Chest Stretch  Right;Left;3 reps;30 seconds    Lower Trunk Rotation  3 reps;30 seconds      Lumbar Exercises: Supine   Pelvic Tilt  10 reps;5 seconds    Clam  10 reps with abdominal  bracing    Bent Knee Raise  10 reps with adbominal bracing; pain with RLE and limited motion      Lumbar Exercises: Sidelying   Clam  Both;10 reps      Modalities   Modalities  Electrical Stimulation;Cryotherapy      Cryotherapy   Number Minutes Cryotherapy  15 Minutes    Cryotherapy Location  Lumbar Spine    Type of Cryotherapy  Ice pack      Electrical Stimulation   Electrical Stimulation Location  Rt lumbar spine    Electrical Stimulation Action  IFC    Electrical Stimulation Parameters  to tolerance x 15 min    Electrical Stimulation Goals  Pain                  PT Long Term Goals - 10/01/17 1122      PT LONG TERM GOAL #1   Title  independent with HEP    Status  On-going  PT LONG TERM GOAL #2   Title  report ability to stand > 15 min without increase in pain for improved function    Status  On-going      PT LONG TERM GOAL #3   Title  verbalize understanding of posture/body mechanics to decrease risk of reinjury    Status  On-going      PT LONG TERM GOAL #4   Title  demonstrate 4/5 RLE strength for improved strength and function    Status  On-going            Plan - 10/13/17 1033    Clinical Impression Statement  Pt tolerated additional core and hip exercises without increase in pain but pain level remains elevated throughout session with estim providing short term relief.      PT Treatment/Interventions  ADLs/Self Care Home Management;Cryotherapy;Electrical Stimulation;Moist Heat;Therapeutic exercise;Therapeutic activities;Traction;Functional mobility training;Stair training;Gait training;Ultrasound;Patient/family education;Manual techniques;Taping;Dry needling    PT Next Visit Plan  progress core stability and hip strengthening, modalities PRN, exercises as able    PT Home Exercise Plan  Access Code: [email or web address]    Consulted and Agree with Plan of Care  Patient       Patient will benefit from skilled therapeutic intervention in order to improve  the following deficits and impairments:  Abnormal gait, Hypomobility, Decreased strength, Pain, Increased muscle spasms, Difficulty walking, Decreased mobility, Decreased range of motion, Improper body mechanics, Postural dysfunction, Impaired flexibility  Visit Diagnosis: Chronic right-sided low back pain with right-sided sciatica  Muscle weakness (generalized)  Other abnormalities of gait and mobility     Problem List Patient Active Problem List   Diagnosis Date Noted  . PROSTATE SPECIFIC ANTIGEN, ELEVATED 07/19/2009  . BACK PAIN 06/11/2007  . Essential hypertension 05/21/2007  . Osteoarthritis 05/21/2007      Laureen Abrahams, PT, DPT 10/13/17 10:34 AM    [facility] [address] [place], [place], [address] Phone: [phone]   Fax:  [phone]  Name: Bryan [name] MRN: [id number] Date of Birth: September 10, 1945

## 2017-10-15 ENCOUNTER — Ambulatory Visit: Payer: PPO | Attending: Neurosurgery | Admitting: Physical Therapy

## 2017-10-15 ENCOUNTER — Encounter: Payer: Self-pay | Admitting: Physical Therapy

## 2017-10-15 DIAGNOSIS — M5441 Lumbago with sciatica, right side: Secondary | ICD-10-CM | POA: Insufficient documentation

## 2017-10-15 DIAGNOSIS — M6281 Muscle weakness (generalized): Secondary | ICD-10-CM

## 2017-10-15 DIAGNOSIS — G8929 Other chronic pain: Secondary | ICD-10-CM | POA: Insufficient documentation

## 2017-10-15 DIAGNOSIS — R2689 Other abnormalities of gait and mobility: Secondary | ICD-10-CM | POA: Diagnosis not present

## 2017-10-15 NOTE — Therapy (Signed)
[facility] [address], [place], [place], [address] Phone: [phone]   Fax:  [phone]  Physical Therapy Treatment  Patient Details  Name: Bryan [name] MRN: [id number] Date of Birth: 06/15/1946 Referring Provider: Dr. Jovita Gamma   Encounter Date: 10/15/2017  PT End of Session - 10/15/17 1047    Visit Number  8    Number of Visits  12    Date for PT Re-Evaluation  11/03/17    Authorization Type  [employer]    PT Start Time  1015    PT Stop Time  1040    PT Time Calculation (min)  25 min    Activity Tolerance  Patient limited by pain    Behavior During Therapy  [facility] for tasks assessed/performed       Past Medical History:  Diagnosis Date  . Hypertension   . Osteoarthritis   . Spinal stenosis     Past Surgical History:  Procedure Laterality Date  . ACHILLES TENDON SURGERY  02/2006  . COLONOSCOPY  2003  . COLONOSCOPY    . KNEE SURGERY     X2  . SPINE SURGERY  12-08, 5-10  . TOTAL HIP ARTHROPLASTY      There were no vitals filed for this visit.  Subjective Assessment - 10/15/17 1041    Subjective  reports no change; "I'm just miserable."    Patient Stated Goals  improve pain, postpone surgery as long as possible    Currently in Pain?  Yes    Pain Score  8     Pain Location  Back    Pain Orientation  Right;Lower    Pain Descriptors / Indicators  Aching    Pain Type  Chronic pain    Pain Radiating Towards  RLE to knee    Pain Onset  More than a month ago    Pain Frequency  Constant    Aggravating Factors   standing, walking    Pain Relieving Factors  medication, swimming         [facility] PT Assessment - 10/15/17 1025      Observation/Other Assessments   Focus on Therapeutic Outcomes (FOTO)   48 (52% limited)                   [facility] Adult PT Treatment/Exercise - 10/15/17 1026      Self-Care   Self-Care  Other Self-Care Comments    Other Self-Care Comments    discussed current progress and symptoms; pt unchanged from initial evaluation.  called MD office to discuss with provider.        Modalities   Modalities  Electrical Stimulation;Cryotherapy      Cryotherapy   Number Minutes Cryotherapy  15 Minutes    Cryotherapy Location  Lumbar Spine    Type of Cryotherapy  Ice pack      Electrical Stimulation   Electrical Stimulation Location  Rt lumbar spine    Electrical Stimulation Action  IFC    Electrical Stimulation Parameters  to tolerance x 15 min    Electrical Stimulation Goals  Pain                  PT Long Term Goals - 10/01/17 1122      PT LONG TERM GOAL #1   Title  independent with HEP    Status  On-going      PT LONG TERM GOAL #2   Title  report ability to stand >  15 min without increase in pain for improved function    Status  On-going      PT LONG TERM GOAL #3   Title  verbalize understanding of posture/body mechanics to decrease risk of reinjury    Status  On-going      PT LONG TERM GOAL #4   Title  demonstrate 4/5 RLE strength for improved strength and function    Status  On-going            Plan - 10/15/17 1047    Clinical Impression Statement  Pt arrived today requesting estim only as all other exercises either exacerbate pain or aren't beneficial at this time.  Called MD's office to discuss limited progress, and left message with Dr. Sherwood Gambler.      PT Treatment/Interventions  ADLs/Self Care Home Management;Cryotherapy;Electrical Stimulation;Moist Heat;Therapeutic exercise;Therapeutic activities;Traction;Functional mobility training;Stair training;Gait training;Ultrasound;Patient/family education;Manual techniques;Taping;Dry needling    PT Next Visit Plan  progress core stability and hip strengthening, modalities PRN, exercises as able    PT Home Exercise Plan  Access Code: [email or web address]    Consulted and Agree with Plan of Care  Patient       Patient will benefit from skilled therapeutic intervention in  order to improve the following deficits and impairments:  Abnormal gait, Hypomobility, Decreased strength, Pain, Increased muscle spasms, Difficulty walking, Decreased mobility, Decreased range of motion, Improper body mechanics, Postural dysfunction, Impaired flexibility  Visit Diagnosis: Chronic right-sided low back pain with right-sided sciatica  Muscle weakness (generalized)  Other abnormalities of gait and mobility     Problem List Patient Active Problem List   Diagnosis Date Noted  . PROSTATE SPECIFIC ANTIGEN, ELEVATED 07/19/2009  . BACK PAIN 06/11/2007  . Essential hypertension 05/21/2007  . Osteoarthritis 05/21/2007      Laureen Abrahams, PT, DPT 10/15/17 10:49 AM     [facility] [address] [place], [place], [address] Phone: [phone]   Fax:  [phone]  Name: Bryan [name] MRN: [id number] Date of Birth: [birth year]

## 2017-10-20 ENCOUNTER — Encounter: Payer: Self-pay | Admitting: Physical Therapy

## 2017-10-20 ENCOUNTER — Ambulatory Visit: Payer: PPO | Admitting: Physical Therapy

## 2017-10-20 DIAGNOSIS — R2689 Other abnormalities of gait and mobility: Secondary | ICD-10-CM

## 2017-10-20 DIAGNOSIS — M5441 Lumbago with sciatica, right side: Principal | ICD-10-CM

## 2017-10-20 DIAGNOSIS — G8929 Other chronic pain: Secondary | ICD-10-CM

## 2017-10-20 DIAGNOSIS — M6281 Muscle weakness (generalized): Secondary | ICD-10-CM

## 2017-10-20 NOTE — Therapy (Signed)
[facility] [address], [place], [place], [address] Phone: [phone]   Fax:  [phone]  Physical Therapy Treatment  Patient Details  Name: Bryan [name] MRN: [id number] Date of Birth: 09-18-1945 Referring Provider: Dr. Jovita Gamma   Encounter Date: 10/20/2017  PT End of Session - 10/20/17 1027    Visit Number  9    Number of Visits  12    Date for PT Re-Evaluation  11/03/17    Authorization Type  [employer]    PT Start Time  1015    PT Stop Time  1035    PT Time Calculation (min)  20 min    Activity Tolerance  Patient limited by pain    Behavior During Therapy  [facility] for tasks assessed/performed       Past Medical History:  Diagnosis Date  . Hypertension   . Osteoarthritis   . Spinal stenosis     Past Surgical History:  Procedure Laterality Date  . ACHILLES TENDON SURGERY  02/2006  . COLONOSCOPY  2003  . COLONOSCOPY    . KNEE SURGERY     X2  . SPINE SURGERY  12-08, 5-10  . TOTAL HIP ARTHROPLASTY      There were no vitals filed for this visit.  Subjective Assessment - 10/20/17 1026    Subjective  "nothing is working, I just want the estim."    Patient Stated Goals  improve pain, postpone surgery as long as possible    Currently in Pain?  Yes    Pain Score  8     Pain Location  Back    Pain Orientation  Right;Lower    Pain Descriptors / Indicators  Aching;Sharp    Pain Type  Chronic pain    Pain Onset  More than a month ago    Pain Frequency  Constant    Aggravating Factors   standing, walking    Pain Relieving Factors  medication, swimming                       [facility] Adult PT Treatment/Exercise - 10/20/17 1027      Cryotherapy   Number Minutes Cryotherapy  15 Minutes    Cryotherapy Location  Lumbar Spine    Type of Cryotherapy  Ice pack      Electrical Stimulation   Electrical Stimulation Location  Rt lumbar spine    Electrical Stimulation Action  IFC     Electrical Stimulation Parameters  to tolerance x 15 min    Electrical Stimulation Goals  Pain                  PT Long Term Goals - 10/01/17 1122      PT LONG TERM GOAL #1   Title  independent with HEP    Status  On-going      PT LONG TERM GOAL #2   Title  report ability to stand > 15 min without increase in pain for improved function    Status  On-going      PT LONG TERM GOAL #3   Title  verbalize understanding of posture/body mechanics to decrease risk of reinjury    Status  On-going      PT LONG TERM GOAL #4   Title  demonstrate 4/5 RLE strength for improved strength and function    Status  On-going            Plan - 10/20/17 1028  Clinical Impression Statement  Pt declined exercise today due to no improvment with exercises and exacerbation of pain with all activity.  Estim and ice pack applied for today's session only.  Discussed progress with Dr. Donnella Bi office and faxed recent treatment notes over for review due to lack of progress.    PT Treatment/Interventions  ADLs/Self Care Home Management;Cryotherapy;Electrical Stimulation;Moist Heat;Therapeutic exercise;Therapeutic activities;Traction;Functional mobility training;Stair training;Gait training;Ultrasound;Patient/family education;Manual techniques;Taping;Dry needling    PT Next Visit Plan  progress core stability and hip strengthening, modalities PRN, exercises as able    PT Home Exercise Plan  Access Code: [email or web address]    Consulted and Agree with Plan of Care  Patient       Patient will benefit from skilled therapeutic intervention in order to improve the following deficits and impairments:  Abnormal gait, Hypomobility, Decreased strength, Pain, Increased muscle spasms, Difficulty walking, Decreased mobility, Decreased range of motion, Improper body mechanics, Postural dysfunction, Impaired flexibility  Visit Diagnosis: Chronic right-sided low back pain with right-sided sciatica  Muscle weakness  (generalized)  Other abnormalities of gait and mobility     Problem List Patient Active Problem List   Diagnosis Date Noted  . PROSTATE SPECIFIC ANTIGEN, ELEVATED 07/19/2009  . BACK PAIN 06/11/2007  . Essential hypertension 05/21/2007  . Osteoarthritis 05/21/2007      Laureen Abrahams, PT, DPT 10/20/17 10:30 AM     [facility] [address], [place], [place], [address] Phone: [phone]   Fax:  [phone]  Name: Bryan [name] MRN: [id number] Date of Birth: [birth year]

## 2017-10-22 ENCOUNTER — Encounter: Payer: Self-pay | Admitting: Physical Therapy

## 2017-10-22 ENCOUNTER — Ambulatory Visit: Payer: PPO | Admitting: Physical Therapy

## 2017-10-22 DIAGNOSIS — M6281 Muscle weakness (generalized): Secondary | ICD-10-CM

## 2017-10-22 DIAGNOSIS — M5441 Lumbago with sciatica, right side: Principal | ICD-10-CM

## 2017-10-22 DIAGNOSIS — R2689 Other abnormalities of gait and mobility: Secondary | ICD-10-CM

## 2017-10-22 DIAGNOSIS — G8929 Other chronic pain: Secondary | ICD-10-CM

## 2017-10-22 NOTE — Therapy (Signed)
[name] [address] [place], [place], [address] Phone: [phone]   Fax:  [phone]  Physical Therapy Treatment  Patient Details  Name: Bryan [name] MRN: [id number] Date of Birth: Nov 11, 1945 Referring Provider: Dr. Jovita [name]    Progress Note Reporting Period 09/22/17 to 10/22/17  See note below for Objective Data and Assessment of Progress/Goals.       Encounter Date: 10/22/2017  Bryan [name] End of Session - 10/22/17 1027    Visit Number  10    Number of Visits  12    Date for Bryan [name] Re-Evaluation  11/03/17    Authorization Type  [employer]    Bryan [name] Start Time  1015    Bryan [name] Stop Time  1037    Bryan [name] Time Calculation (min)  22 min    Activity Tolerance  Patient limited by pain    Behavior During Therapy  Bryan [name] for tasks assessed/performed       Past Medical History:  Diagnosis Date  . Hypertension   . Osteoarthritis   . Spinal stenosis     Past Surgical History:  Procedure Laterality Date  . ACHILLES TENDON SURGERY  02/2006  . COLONOSCOPY  2003  . COLONOSCOPY    . KNEE SURGERY     X2  . SPINE SURGERY  12-08, 5-10  . TOTAL HIP ARTHROPLASTY      There were no vitals filed for this visit.  Subjective Assessment - 10/22/17 1026    Subjective  Can I just do the estim? I've got one more week then I see Bryan [name]."    Patient Stated Goals  improve pain, postpone surgery as long as possible    Currently in Pain?  Yes    Pain Score  8     Pain Location  Back    Pain Orientation  Right;Lower    Pain Descriptors / Indicators  Aching;Sharp    Pain Type  Chronic pain    Pain Onset  More than a month ago    Pain Frequency  Constant    Aggravating Factors   standing, walking    Pain Relieving Factors  medication, swimming                       [facility] Adult Bryan [name] Treatment/Exercise - 10/22/17 1027      Modalities   Modalities  Electrical Stimulation;Cryotherapy      Cryotherapy   Number Minutes Cryotherapy  15 Minutes    Cryotherapy Location  Lumbar Spine    Type of Cryotherapy  Ice pack      Electrical Stimulation   Electrical Stimulation Location  Rt lumbar spine    Electrical Stimulation Action  IFC    Electrical Stimulation Parameters  to tolerance x 15 min    Electrical Stimulation Goals  Pain                  Bryan [name] Long Term Goals - 10/22/17 1027      Bryan [name] LONG TERM GOAL #1   Title  independent with HEP    Baseline  10/22/17: goal met but exercises do not improve symptoms    Status  Achieved      Bryan [name] LONG TERM GOAL #2   Title  report ability to stand > 15 min without increase in pain for improved function    Baseline  10/22/17: no change    Status  On-going      Bryan [name] LONG TERM  GOAL #3   Title  verbalize understanding of posture/body mechanics to decrease risk of reinjury    Baseline  10/22/17: ongoing education    Status  On-going      Bryan [name] LONG TERM GOAL #4   Title  demonstrate 4/5 RLE strength for improved strength and function    Baseline  10/22/17: deferred due to elevated pain and no change in function    Status  On-going            Plan - 10/22/17 1029    Clinical Impression Statement  Bryan [name] continues to report elevated pain without improvment in symptoms, and only gets short term relief from estim and cryotherapy.  Bryan [name] with worsening antalgic gait and anticipate d/c next week.    Bryan [name] Treatment/Interventions  ADLs/Self Care Home Management;Cryotherapy;Electrical Stimulation;Moist Heat;Therapeutic exercise;Therapeutic activities;Traction;Functional mobility training;Stair training;Gait training;Ultrasound;Patient/family education;Manual techniques;Taping;Dry needling    Bryan [name] Next Visit Plan  plan for d/c next week (2 more visits)    Bryan [name] Home Exercise Plan  Access Code: [email or web address]    Consulted and Agree with Plan of Care  Patient       Patient will benefit from skilled therapeutic intervention in order to improve the following deficits and  impairments:  Abnormal gait, Hypomobility, Decreased strength, Pain, Increased muscle spasms, Difficulty walking, Decreased mobility, Decreased range of motion, Improper body mechanics, Postural dysfunction, Impaired flexibility  Visit Diagnosis: Chronic right-sided low back pain with right-sided sciatica  Muscle weakness (generalized)  Other abnormalities of gait and mobility     Problem List Patient Active Problem List   Diagnosis Date Noted  . PROSTATE SPECIFIC ANTIGEN, ELEVATED 07/19/2009  . BACK PAIN 06/11/2007  . Essential hypertension 05/21/2007  . Osteoarthritis 05/21/2007      Bryan [name], Bryan [name], Bryan [name] 10/22/17 10:38 AM    [name] [address] [place], [place], [address] Phone: [phone]   Fax:  [phone]  Name: Bryan [name] MRN: [id number] Date of Birth: 1946-02-14

## 2017-10-27 ENCOUNTER — Encounter: Payer: Self-pay | Admitting: Physical Therapy

## 2017-10-27 ENCOUNTER — Other Ambulatory Visit: Payer: Self-pay | Admitting: Internal Medicine

## 2017-10-27 ENCOUNTER — Ambulatory Visit: Payer: PPO | Admitting: Physical Therapy

## 2017-10-27 DIAGNOSIS — M5441 Lumbago with sciatica, right side: Principal | ICD-10-CM

## 2017-10-27 DIAGNOSIS — R2689 Other abnormalities of gait and mobility: Secondary | ICD-10-CM

## 2017-10-27 DIAGNOSIS — M6281 Muscle weakness (generalized): Secondary | ICD-10-CM

## 2017-10-27 DIAGNOSIS — G8929 Other chronic pain: Secondary | ICD-10-CM

## 2017-10-27 NOTE — Therapy (Signed)
[facility] [address] [place], [place], [address] Phone: [phone]   Fax:  [phone]  Physical Therapy Treatment  Patient Details  Name: Bryan [name] MRN: [id number] Date of Birth: 20-Nov-1945 Referring Provider: Dr. Jovita Gamma   Encounter Date: 10/27/2017  PT End of Session - 10/27/17 1037    Visit Number  11    Number of Visits  12    Date for PT Re-Evaluation  11/03/17    Authorization Type  [employer]    PT Start Time  1010    PT Stop Time  1030    PT Time Calculation (min)  20 min    Activity Tolerance  Patient limited by pain    Behavior During Therapy  [facility] for tasks assessed/performed       Past Medical History:  Diagnosis Date  . Hypertension   . Osteoarthritis   . Spinal stenosis     Past Surgical History:  Procedure Laterality Date  . ACHILLES TENDON SURGERY  02/2006  . COLONOSCOPY  2003  . COLONOSCOPY    . KNEE SURGERY     X2  . SPINE SURGERY  12-08, 5-10  . TOTAL HIP ARTHROPLASTY      There were no vitals filed for this visit.  Subjective Assessment - 10/27/17 1030    Subjective  Estim helps a little but doesn't last    Limitations  Standing;Walking    How long can you stand comfortably?  immediately uncomfortable, up to 5 min    How long can you walk comfortably?  5 min    Diagnostic tests  MRI: L2-5 spinal stenosis    Patient Stated Goals  improve pain, postpone surgery as long as possible    Currently in Pain?  Yes    Pain Score  8     Pain Location  Back    Pain Orientation  Right;Lower    Pain Descriptors / Indicators  Aching;Sharp    Pain Type  Chronic pain    Pain Onset  More than a month ago    Pain Frequency  Constant    Multiple Pain Sites  No                       [facility] Adult PT Treatment/Exercise - 10/27/17 0001      Cryotherapy   Number Minutes Cryotherapy  20 Minutes    Cryotherapy Location  Lumbar Spine    Type of  Cryotherapy  Ice pack      Electrical Stimulation   Electrical Stimulation Location  Rt lumbar spine    Electrical Stimulation Action  IFC    Electrical Stimulation Parameters  to tolerance x 20 min    Electrical Stimulation Goals  Pain                  PT Long Term Goals - 10/22/17 1027      PT LONG TERM GOAL #1   Title  independent with HEP    Baseline  10/22/17: goal met but exercises do not improve symptoms    Status  Achieved      PT LONG TERM GOAL #2   Title  report ability to stand > 15 min without increase in pain for improved function    Baseline  10/22/17: no change    Status  On-going      PT LONG TERM GOAL #3   Title  verbalize understanding of posture/body mechanics  to decrease risk of reinjury    Baseline  10/22/17: ongoing education    Status  On-going      PT LONG TERM GOAL #4   Title  demonstrate 4/5 RLE strength for improved strength and function    Baseline  10/22/17: deferred due to elevated pain and no change in function    Status  On-going            Plan - 10/27/17 1032    Clinical Impression Statement  Pt continues to demonstrate lack of progress and ambulating with antalgic gait.  He has difficulty with bed mobility with increased pain elevating right LE.  Pt will most likely discharge next visit    PT Treatment/Interventions  ADLs/Self Care Home Management;Cryotherapy;Electrical Stimulation;Moist Heat;Therapeutic exercise;Therapeutic activities;Traction;Functional mobility training;Stair training;Gait training;Ultrasound;Patient/family education;Manual techniques;Taping;Dry needling    PT Next Visit Plan  plan for d/c next week (1 more visits)    PT Home Exercise Plan  Access Code: [email or web address]    Consulted and Agree with Plan of Care  Patient       Patient will benefit from skilled therapeutic intervention in order to improve the following deficits and impairments:  Abnormal gait, Hypomobility, Decreased strength, Pain, Increased muscle  spasms, Difficulty walking, Decreased mobility, Decreased range of motion, Improper body mechanics, Postural dysfunction, Impaired flexibility  Visit Diagnosis: Chronic right-sided low back pain with right-sided sciatica  Muscle weakness (generalized)  Other abnormalities of gait and mobility     Problem List Patient Active Problem List   Diagnosis Date Noted  . PROSTATE SPECIFIC ANTIGEN, ELEVATED 07/19/2009  . BACK PAIN 06/11/2007  . Essential hypertension 05/21/2007  . Osteoarthritis 05/21/2007    Zannie Cove, PT 10/27/2017, 10:40 AM  [facility] [address], [place], [place], [address] Phone: [phone]   Fax:  [phone]  Name: Bryan [name] MRN: [id number] Date of Birth: [birth year]

## 2017-10-29 ENCOUNTER — Ambulatory Visit: Payer: PPO | Admitting: Physical Therapy

## 2017-10-29 DIAGNOSIS — R2689 Other abnormalities of gait and mobility: Secondary | ICD-10-CM

## 2017-10-29 DIAGNOSIS — M5441 Lumbago with sciatica, right side: Secondary | ICD-10-CM | POA: Diagnosis not present

## 2017-10-29 DIAGNOSIS — G8929 Other chronic pain: Secondary | ICD-10-CM

## 2017-10-29 DIAGNOSIS — M6281 Muscle weakness (generalized): Secondary | ICD-10-CM

## 2017-10-29 NOTE — Therapy (Signed)
[facility] [address], [place], [place], [address] Phone: [phone]   Fax:  [phone]  Physical Therapy Treatment  Patient Details  Name: Bryan [name] MRN: [id number] Date of Birth: January 27, 1946 Referring Provider: Dr. Jovita Gamma   Encounter Date: 10/29/2017  PT End of Session - 10/29/17 1027    Visit Number  12    Number of Visits  12    Date for PT Re-Evaluation  11/03/17    Authorization Type  [employer]    PT Start Time  1020    PT Stop Time  1045    PT Time Calculation (min)  25 min    Activity Tolerance  Patient limited by pain    Behavior During Therapy  [facility] for tasks assessed/performed       Past Medical History:  Diagnosis Date  . Hypertension   . Osteoarthritis   . Spinal stenosis     Past Surgical History:  Procedure Laterality Date  . ACHILLES TENDON SURGERY  02/2006  . COLONOSCOPY  2003  . COLONOSCOPY    . KNEE SURGERY     X2  . SPINE SURGERY  12-08, 5-10  . TOTAL HIP ARTHROPLASTY      There were no vitals filed for this visit.  Subjective Assessment - 10/29/17 1038    Subjective  I can't even lift my leg to get in the car.  I am no better, but no worse    Limitations  Standing;Walking    How long can you stand comfortably?  immediately uncomfortable, up to 5 min    Patient Stated Goals  improve pain, postpone surgery as long as possible    Currently in Pain?  Yes    Pain Score  8     Pain Location  Back    Pain Orientation  Right;Lower    Pain Descriptors / Indicators  Aching;Sharp    Pain Type  Chronic pain    Pain Onset  More than a month ago    Pain Frequency  Constant    Aggravating Factors   standing, walking    Pain Relieving Factors  medication, swimming    Multiple Pain Sites  No                       [facility] Adult PT Treatment/Exercise - 10/29/17 0001      Cryotherapy   Number Minutes Cryotherapy  20 Minutes    Cryotherapy  Location  Lumbar Spine    Type of Cryotherapy  Ice pack      Electrical Stimulation   Electrical Stimulation Location  Rt lumbar spine    Electrical Stimulation Action  IFC    Electrical Stimulation Parameters  to tolerance x 20 min    Electrical Stimulation Goals  Pain       discussed POC and HEP; FOTO 37% limited           PT Long Term Goals - 10/29/17 1024      PT LONG TERM GOAL #1   Title  independent with HEP    Status  Achieved      PT LONG TERM GOAL #2   Title  report ability to stand > 15 min without increase in pain for improved function    Baseline  just getting up is pain; can barely stand to shave in the morning and can only put weight on the left leg    Status  Not  Met      PT LONG TERM GOAL #3   Title  verbalize understanding of posture/body mechanics to decrease risk of reinjury    Status  Achieved      PT LONG TERM GOAL #4   Title  demonstrate 4/5 RLE strength for improved strength and function    Baseline  10/29/17: deferred due to elevated pain and no change in function    Status  Not Met            Plan - 10/29/17 1059    Clinical Impression Statement  Pt has not met most of his functional goals and is recommended to return to orthopedic physician to re-assess. Pt is discharged with HEP today.    PT Treatment/Interventions  ADLs/Self Care Home Management;Cryotherapy;Electrical Stimulation;Moist Heat;Therapeutic exercise;Therapeutic activities;Traction;Functional mobility training;Stair training;Gait training;Ultrasound;Patient/family education;Manual techniques;Taping;Dry needling    PT Next Visit Plan  discharged today    Consulted and Agree with Plan of Care  Patient       Patient will benefit from skilled therapeutic intervention in order to improve the following deficits and impairments:  Abnormal gait, Hypomobility, Decreased strength, Pain, Increased muscle spasms, Difficulty walking, Decreased mobility, Decreased range of motion,  Improper body mechanics, Postural dysfunction, Impaired flexibility  Visit Diagnosis: Chronic right-sided low back pain with right-sided sciatica  Muscle weakness (generalized)  Other abnormalities of gait and mobility     Problem List Patient Active Problem List   Diagnosis Date Noted  . PROSTATE SPECIFIC ANTIGEN, ELEVATED 07/19/2009  . BACK PAIN 06/11/2007  . Essential hypertension 05/21/2007  . Osteoarthritis 05/21/2007    Zannie Cove, PT 10/29/2017, 11:07 AM  [facility] [address], [place], [place], [address] Phone: [phone]   Fax:  [phone]  Name: Bryan [name] MRN: [id number] Date of Birth: January 07, 1946  PHYSICAL THERAPY DISCHARGE SUMMARY  Visits from Start of Care: 12  Current functional level related to goals / functional outcomes: See above comments   Remaining deficits: See above comments   Education / Equipment: HEP  Plan: Patient agrees to discharge.  Patient goals were not met. Patient is being discharged due to lack of progress.  ?????    Google, PT 10/29/17 11:07 AM

## 2017-11-04 ENCOUNTER — Other Ambulatory Visit: Payer: Self-pay | Admitting: Neurosurgery

## 2017-11-04 DIAGNOSIS — M47816 Spondylosis without myelopathy or radiculopathy, lumbar region: Secondary | ICD-10-CM | POA: Diagnosis not present

## 2017-11-04 DIAGNOSIS — M5136 Other intervertebral disc degeneration, lumbar region: Secondary | ICD-10-CM | POA: Diagnosis not present

## 2017-11-04 DIAGNOSIS — M48062 Spinal stenosis, lumbar region with neurogenic claudication: Secondary | ICD-10-CM | POA: Diagnosis not present

## 2017-11-04 DIAGNOSIS — M4156 Other secondary scoliosis, lumbar region: Secondary | ICD-10-CM | POA: Diagnosis not present

## 2017-11-10 ENCOUNTER — Other Ambulatory Visit: Payer: Self-pay

## 2017-11-10 ENCOUNTER — Encounter (HOSPITAL_COMMUNITY)
Admission: RE | Admit: 2017-11-10 | Discharge: 2017-11-10 | Disposition: A | Discharge status: Home / Self Care | Payer: PPO | Source: Ambulatory Visit | Attending: Neurosurgery | Admitting: Neurosurgery

## 2017-11-10 ENCOUNTER — Encounter (HOSPITAL_COMMUNITY): Payer: Self-pay

## 2017-11-10 DIAGNOSIS — M4326 Fusion of spine, lumbar region: Secondary | ICD-10-CM | POA: Diagnosis not present

## 2017-11-10 DIAGNOSIS — Z791 Long term (current) use of non-steroidal anti-inflammatories (NSAID): Secondary | ICD-10-CM | POA: Diagnosis not present

## 2017-11-10 DIAGNOSIS — M5116 Intervertebral disc disorders with radiculopathy, lumbar region: Secondary | ICD-10-CM | POA: Diagnosis not present

## 2017-11-10 DIAGNOSIS — M1990 Unspecified osteoarthritis, unspecified site: Secondary | ICD-10-CM | POA: Diagnosis not present

## 2017-11-10 DIAGNOSIS — Z96649 Presence of unspecified artificial hip joint: Secondary | ICD-10-CM | POA: Diagnosis not present

## 2017-11-10 DIAGNOSIS — M4185 Other forms of scoliosis, thoracolumbar region: Secondary | ICD-10-CM | POA: Diagnosis not present

## 2017-11-10 DIAGNOSIS — M48062 Spinal stenosis, lumbar region with neurogenic claudication: Secondary | ICD-10-CM | POA: Diagnosis not present

## 2017-11-10 DIAGNOSIS — Z8249 Family history of ischemic heart disease and other diseases of the circulatory system: Secondary | ICD-10-CM | POA: Diagnosis not present

## 2017-11-10 DIAGNOSIS — Z885 Allergy status to narcotic agent status: Secondary | ICD-10-CM | POA: Diagnosis not present

## 2017-11-10 DIAGNOSIS — Z79899 Other long term (current) drug therapy: Secondary | ICD-10-CM | POA: Diagnosis not present

## 2017-11-10 DIAGNOSIS — M4726 Other spondylosis with radiculopathy, lumbar region: Secondary | ICD-10-CM | POA: Diagnosis not present

## 2017-11-10 DIAGNOSIS — I10 Essential (primary) hypertension: Secondary | ICD-10-CM | POA: Diagnosis not present

## 2017-11-10 DIAGNOSIS — Z85828 Personal history of other malignant neoplasm of skin: Secondary | ICD-10-CM | POA: Diagnosis not present

## 2017-11-10 HISTORY — DX: Gastro-esophageal reflux disease without esophagitis: K21.9

## 2017-11-10 HISTORY — DX: Malignant (primary) neoplasm, unspecified: C80.1

## 2017-11-10 HISTORY — DX: Major depressive disorder, single episode, unspecified: F32.9

## 2017-11-10 HISTORY — DX: Depression, unspecified: F32.A

## 2017-11-10 HISTORY — DX: Anxiety disorder, unspecified: F41.9

## 2017-11-10 LAB — BASIC METABOLIC PANEL
Anion gap: 11 (ref 5–15)
BUN: 27 mg/dL — AB (ref 6–20)
CALCIUM: 10.2 mg/dL (ref 8.9–10.3)
CO2: 26 mmol/L (ref 22–32)
CREATININE: 1.36 mg/dL — AB (ref 0.61–1.24)
Chloride: 101 mmol/L (ref 101–111)
GFR, EST AFRICAN AMERICAN: 60 mL/min — AB (ref >60)
GFR, EST NON AFRICAN AMERICAN: 52 mL/min — AB (ref >60)
Glucose, Bld: 156 mg/dL — ABNORMAL HIGH (ref 65–99)
Potassium: 3.6 mmol/L (ref 3.5–5.1)
Sodium: 138 mmol/L (ref 135–145)

## 2017-11-10 LAB — TYPE AND SCREEN
ABO/RH(D): O POS
Antibody Screen: NEGATIVE

## 2017-11-10 LAB — CBC
HCT: 48.4 % (ref 39.0–52.0)
Hemoglobin: 15.7 g/dL (ref 13.0–17.0)
MCH: 29.6 pg (ref 26.0–34.0)
MCHC: 32.4 g/dL (ref 30.0–36.0)
MCV: 91.3 fL (ref 78.0–100.0)
PLATELETS: 316 10*3/uL (ref 150–400)
RBC: 5.3 MIL/uL (ref 4.22–5.81)
RDW: 12.5 % (ref 11.5–15.5)
WBC: 7.5 10*3/uL (ref 4.0–10.5)

## 2017-11-10 LAB — ABO/RH: ABO/RH(D): O POS

## 2017-11-10 LAB — SURGICAL PCR SCREEN
MRSA, PCR: NEGATIVE
STAPHYLOCOCCUS AUREUS: NEGATIVE

## 2017-11-10 NOTE — Pre-Procedure Instructions (Signed)
Bryan [name]  11/10/2017      [place], [place] [address] Phone: [phone] Fax: [phone]    Your procedure is scheduled on Wednesday, May 29th   Report to [facility] Admitting at 8:00 AM             (posted surgery time 10:03a - 5:23p)   Call this number if you have problems the morning of surgery:  [phone]   Remember:   No food or liquids after 12 midnight tonight.   Take these medicines the morning of surgery with A SIP OF WATER : Tizanidine, Tramadol    Do not wear jewelry - no rings or watches.  Do not wear lotions, colognes or deodorant.             Men may shave face and neck.  Do not bring valuables to the hospital.  [facility] is not responsible for any belongings or valuables.  Contacts, dentures or bridgework may not be worn into surgery.  Leave your suitcase in the car.  After surgery it may be brought to your room.  For patients admitted to the hospital, discharge time will be determined by your treatment team.  Please read over the following fact sheets that you were given. Pain Booklet, MRSA Information and Surgical Site Infection Prevention       [place]- Preparing For Surgery  Before surgery, you can play an important role. Because skin is not sterile, your skin needs to be as free of germs as possible. You can reduce the number of germs on your skin by washing with CHG (chlorahexidine gluconate) Soap before surgery.  CHG is an antiseptic cleaner which kills germs and bonds with the skin to continue killing germs even after washing.    Oral Hygiene is also important to reduce your risk of infection.  Remember - BRUSH YOUR TEETH THE MORNING OF SURGERY WITH YOUR REGULAR TOOTHPASTE  Please do not use if you have an allergy to CHG or antibacterial soaps. If your skin becomes reddened/irritated stop using the CHG.  Do not shave (including  legs and underarms) for at least 48 hours prior to first CHG shower. It is OK to shave your face.  Please follow these instructions carefully.   1. Shower the NIGHT BEFORE SURGERY and the MORNING OF SURGERY with CHG.   2. If you chose to wash your hair, wash your hair first as usual with your normal shampoo.  3. After you shampoo, rinse your hair and body thoroughly to remove the shampoo.  4. Use CHG as you would any other liquid soap. You can apply CHG directly to the skin and wash gently with a scrungie or a clean washcloth.   5. Apply the CHG Soap to your body ONLY FROM THE NECK DOWN.  Do not use on open wounds or open sores. Avoid contact with your eyes, ears, mouth and genitals (private parts). Wash Face and genitals (private parts)  with your normal soap.  6. Wash thoroughly, paying special attention to the area where your surgery will be performed.  7. Thoroughly rinse your body with warm water from the neck down.  8. DO NOT shower/wash with your normal soap after using and rinsing off the CHG Soap.  9. Pat yourself dry with a CLEAN TOWEL.  10. Wear CLEAN PAJAMAS to bed the night before surgery, wear comfortable clothes the morning  of surgery  11. Place CLEAN SHEETS on your bed the night of your first shower and DO NOT SLEEP WITH PETS.    Day of Surgery:  Do not apply any deodorants/lotions.  Please wear clean clothes to the hospital/surgery center.   Remember to brush your teeth WITH YOUR REGULAR TOOTHPASTE.

## 2017-11-10 NOTE — Progress Notes (Signed)
   11/10/17 1414  OBSTRUCTIVE SLEEP APNEA  Have you ever been diagnosed with sleep apnea through a sleep study? No  Do you snore loudly (loud enough to be heard through closed doors)?  1  Do you often feel tired, fatigued, or sleepy during the daytime (such as falling asleep during driving or talking to someone)? 0  Has anyone observed you stop breathing during your sleep? 0  Do you have, or are you being treated for high blood pressure? 1  BMI more than 35 kg/m2? 0  Age > 50 (1-yes) 1  Neck circumference greater than:Male 16 inches or larger, Male 17inches or larger? 1  Male Gender (Yes=1) 1  Obstructive Sleep Apnea Score 5  Score 5 or greater  Results sent to PCP

## 2017-11-10 NOTE — Progress Notes (Signed)
PCP is Dr. Ferne Coe   LOV 01/2017 Denies murmur, sob, cp.  Has had no cardiac issues or tests.

## 2017-11-11 ENCOUNTER — Inpatient Hospital Stay (HOSPITAL_COMMUNITY): Payer: PPO | Admitting: Anesthesiology

## 2017-11-11 ENCOUNTER — Inpatient Hospital Stay (HOSPITAL_COMMUNITY)
Admission: RE | Admit: 2017-11-11 | Discharge: 2017-11-13 | DRG: 460 | Disposition: A | Discharge status: Home / Self Care | Payer: PPO | Source: Ambulatory Visit | Attending: Neurosurgery | Admitting: Neurosurgery

## 2017-11-11 ENCOUNTER — Inpatient Hospital Stay (HOSPITAL_COMMUNITY)
Admission: RE | Disposition: A | Discharge status: Home / Self Care | Payer: Self-pay | Source: Ambulatory Visit | Attending: Neurosurgery

## 2017-11-11 ENCOUNTER — Inpatient Hospital Stay (HOSPITAL_COMMUNITY): Payer: PPO

## 2017-11-11 ENCOUNTER — Encounter (HOSPITAL_COMMUNITY): Payer: Self-pay | Admitting: Certified Registered"

## 2017-11-11 DIAGNOSIS — M4185 Other forms of scoliosis, thoracolumbar region: Secondary | ICD-10-CM | POA: Diagnosis present

## 2017-11-11 DIAGNOSIS — Z85828 Personal history of other malignant neoplasm of skin: Secondary | ICD-10-CM | POA: Diagnosis not present

## 2017-11-11 DIAGNOSIS — M1990 Unspecified osteoarthritis, unspecified site: Secondary | ICD-10-CM | POA: Diagnosis present

## 2017-11-11 DIAGNOSIS — Z791 Long term (current) use of non-steroidal anti-inflammatories (NSAID): Secondary | ICD-10-CM

## 2017-11-11 DIAGNOSIS — I10 Essential (primary) hypertension: Secondary | ICD-10-CM | POA: Diagnosis present

## 2017-11-11 DIAGNOSIS — Z79899 Other long term (current) drug therapy: Secondary | ICD-10-CM | POA: Diagnosis not present

## 2017-11-11 DIAGNOSIS — Z419 Encounter for procedure for purposes other than remedying health state, unspecified: Secondary | ICD-10-CM

## 2017-11-11 DIAGNOSIS — M5116 Intervertebral disc disorders with radiculopathy, lumbar region: Secondary | ICD-10-CM | POA: Diagnosis present

## 2017-11-11 DIAGNOSIS — Z885 Allergy status to narcotic agent status: Secondary | ICD-10-CM | POA: Diagnosis not present

## 2017-11-11 DIAGNOSIS — Z8249 Family history of ischemic heart disease and other diseases of the circulatory system: Secondary | ICD-10-CM | POA: Diagnosis not present

## 2017-11-11 DIAGNOSIS — M4726 Other spondylosis with radiculopathy, lumbar region: Secondary | ICD-10-CM | POA: Diagnosis present

## 2017-11-11 DIAGNOSIS — Z96649 Presence of unspecified artificial hip joint: Secondary | ICD-10-CM | POA: Diagnosis present

## 2017-11-11 DIAGNOSIS — M48062 Spinal stenosis, lumbar region with neurogenic claudication: Principal | ICD-10-CM | POA: Diagnosis present

## 2017-11-11 DIAGNOSIS — M4326 Fusion of spine, lumbar region: Secondary | ICD-10-CM | POA: Diagnosis not present

## 2017-11-11 HISTORY — PX: LUMBAR PERCUTANEOUS PEDICLE SCREW 3 LEVEL: SHX5562

## 2017-11-11 HISTORY — PX: ANTERIOR LAT LUMBAR FUSION: SHX1168

## 2017-11-11 SURGERY — ANTERIOR LATERAL LUMBAR FUSION 3 LEVELS
Anesthesia: General | Site: Flank

## 2017-11-11 MED ORDER — PROPOFOL 1000 MG/100ML IV EMUL
INTRAVENOUS | Status: AC
  Filled 2017-11-11: qty 100

## 2017-11-11 MED ORDER — EPHEDRINE SULFATE 50 MG/ML IJ SOLN
INTRAMUSCULAR | Status: AC
  Filled 2017-11-11: qty 1

## 2017-11-11 MED ORDER — FENTANYL CITRATE (PF) 100 MCG/2ML IJ SOLN
INTRAMUSCULAR | Status: DC | PRN
  Administered 2017-11-11: 100 ug via INTRAVENOUS
  Administered 2017-11-11 (×3): 50 ug via INTRAVENOUS
  Administered 2017-11-11: 100 ug via INTRAVENOUS
  Administered 2017-11-11 (×5): 50 ug via INTRAVENOUS

## 2017-11-11 MED ORDER — LISINOPRIL-HYDROCHLOROTHIAZIDE 20-25 MG PO TABS: 1.0000 | ORAL_TABLET | Freq: Every day | ORAL | Status: DC

## 2017-11-11 MED ORDER — SODIUM CHLORIDE 0.9 % IV SOLN
INTRAVENOUS | Status: DC | PRN
  Administered 2017-11-11: 11:00:00

## 2017-11-11 MED ORDER — FENTANYL CITRATE (PF) 250 MCG/5ML IJ SOLN
INTRAMUSCULAR | Status: AC
  Filled 2017-11-11: qty 5

## 2017-11-11 MED ORDER — SODIUM CHLORIDE 0.9 % IV SOLN: 250.0000 mL | INTRAVENOUS | Status: DC

## 2017-11-11 MED ORDER — MIDAZOLAM HCL 2 MG/2ML IJ SOLN
INTRAMUSCULAR | Status: DC | PRN
  Administered 2017-11-11: 2 mg via INTRAVENOUS

## 2017-11-11 MED ORDER — THROMBIN (RECOMBINANT) 20000 UNITS EX SOLR
CUTANEOUS | Status: DC | PRN
  Administered 2017-11-11: 11:00:00 via TOPICAL

## 2017-11-11 MED ORDER — PROMETHAZINE HCL 25 MG/ML IJ SOLN: 6.2500 mg | INTRAMUSCULAR | Status: DC | PRN

## 2017-11-11 MED ORDER — CHLORHEXIDINE GLUCONATE CLOTH 2 % EX PADS: 6.0000 | MEDICATED_PAD | Freq: Once | CUTANEOUS | Status: DC

## 2017-11-11 MED ORDER — FLEET ENEMA 7-19 GM/118ML RE ENEM: 1.0000 | ENEMA | Freq: Once | RECTAL | Status: DC | PRN

## 2017-11-11 MED ORDER — PHENOL 1.4 % MT LIQD: 1.0000 | OROMUCOSAL | Status: DC | PRN

## 2017-11-11 MED ORDER — SODIUM CHLORIDE 0.9% FLUSH: 3.0000 mL | INTRAVENOUS | Status: DC | PRN

## 2017-11-11 MED ORDER — LIDOCAINE-EPINEPHRINE 1 %-1:100000 IJ SOLN
INTRAMUSCULAR | Status: AC
  Filled 2017-11-11: qty 1

## 2017-11-11 MED ORDER — MORPHINE SULFATE (PF) 4 MG/ML IV SOLN
4.0000 mg | INTRAVENOUS | Status: DC | PRN
  Administered 2017-11-12: 4 mg via INTRAMUSCULAR
  Filled 2017-11-11: qty 1

## 2017-11-11 MED ORDER — THROMBIN 20000 UNITS EX SOLR
CUTANEOUS | Status: AC
  Filled 2017-11-11: qty 20000

## 2017-11-11 MED ORDER — ACETAMINOPHEN 650 MG RE SUPP: 650.0000 mg | RECTAL | Status: DC | PRN

## 2017-11-11 MED ORDER — HYDROCODONE-ACETAMINOPHEN 5-325 MG PO TABS
1.0000 | ORAL_TABLET | ORAL | Status: DC | PRN
  Administered 2017-11-11: 2 via ORAL
  Administered 2017-11-11: 1 via ORAL
  Administered 2017-11-12 – 2017-11-13 (×9): 2 via ORAL
  Filled 2017-11-11 (×10): qty 2

## 2017-11-11 MED ORDER — CYCLOBENZAPRINE HCL 10 MG PO TABS
ORAL_TABLET | ORAL | Status: AC
  Filled 2017-11-11: qty 1

## 2017-11-11 MED ORDER — CEFAZOLIN SODIUM-DEXTROSE 2-4 GM/100ML-% IV SOLN
2.0000 g | INTRAVENOUS | Status: AC
  Administered 2017-11-11 (×2): 2 g via INTRAVENOUS

## 2017-11-11 MED ORDER — DEXAMETHASONE SODIUM PHOSPHATE 10 MG/ML IJ SOLN
INTRAMUSCULAR | Status: AC
  Filled 2017-11-11: qty 1

## 2017-11-11 MED ORDER — ONDANSETRON HCL 4 MG/2ML IJ SOLN
INTRAMUSCULAR | Status: AC
  Filled 2017-11-11: qty 2

## 2017-11-11 MED ORDER — LIDOCAINE 2% (20 MG/ML) 5 ML SYRINGE
INTRAMUSCULAR | Status: AC
  Filled 2017-11-11: qty 5

## 2017-11-11 MED ORDER — LIDOCAINE 2% (20 MG/ML) 5 ML SYRINGE
INTRAMUSCULAR | Status: DC | PRN
  Administered 2017-11-11: 80 mg via INTRAVENOUS

## 2017-11-11 MED ORDER — HYDROCODONE-ACETAMINOPHEN 5-325 MG PO TABS
ORAL_TABLET | ORAL | Status: AC
  Filled 2017-11-11: qty 1

## 2017-11-11 MED ORDER — KCL IN DEXTROSE-NACL 30-5-0.45 MEQ/L-%-% IV SOLN
INTRAVENOUS | Status: DC
  Filled 2017-11-11: qty 1000

## 2017-11-11 MED ORDER — ROCURONIUM BROMIDE 50 MG/5ML IV SOLN
INTRAVENOUS | Status: AC
  Filled 2017-11-11: qty 1

## 2017-11-11 MED ORDER — PROPOFOL 10 MG/ML IV BOLUS
INTRAVENOUS | Status: DC | PRN
  Administered 2017-11-11: 50 mg via INTRAVENOUS
  Administered 2017-11-11: 150 mg via INTRAVENOUS

## 2017-11-11 MED ORDER — HYDROXYZINE HCL 25 MG PO TABS: 50.0000 mg | ORAL_TABLET | ORAL | Status: DC | PRN

## 2017-11-11 MED ORDER — PHENYLEPHRINE HCL 10 MG/ML IJ SOLN
INTRAVENOUS | Status: DC | PRN
  Administered 2017-11-11: 25 ug/min via INTRAVENOUS

## 2017-11-11 MED ORDER — LACTATED RINGERS IV SOLN
INTRAVENOUS | Status: DC | PRN
  Administered 2017-11-11 (×3): via INTRAVENOUS

## 2017-11-11 MED ORDER — HYDROMORPHONE HCL 2 MG/ML IJ SOLN
0.2500 mg | INTRAMUSCULAR | Status: DC | PRN
  Administered 2017-11-11 (×2): 0.5 mg via INTRAVENOUS

## 2017-11-11 MED ORDER — BUPIVACAINE HCL (PF) 0.5 % IJ SOLN
INTRAMUSCULAR | Status: AC
  Filled 2017-11-11: qty 30

## 2017-11-11 MED ORDER — CYCLOBENZAPRINE HCL 5 MG PO TABS
5.0000 mg | ORAL_TABLET | Freq: Three times a day (TID) | ORAL | Status: DC | PRN
Start: 2017-11-11 — End: 2017-11-13
  Administered 2017-11-11: 10 mg via ORAL
  Administered 2017-11-12 (×2): 5 mg via ORAL
  Administered 2017-11-12: 10 mg via ORAL
  Filled 2017-11-11: qty 2
  Filled 2017-11-11 (×2): qty 1

## 2017-11-11 MED ORDER — MENTHOL 3 MG MT LOZG: 1.0000 | LOZENGE | OROMUCOSAL | Status: DC | PRN

## 2017-11-11 MED ORDER — HYDROXYZINE HCL 25 MG PO TABS
50.0000 mg | ORAL_TABLET | ORAL | Status: DC | PRN
  Administered 2017-11-12: 50 mg via ORAL
  Filled 2017-11-11: qty 2

## 2017-11-11 MED ORDER — MAGNESIUM HYDROXIDE 400 MG/5ML PO SUSP: 30.0000 mL | Freq: Every day | ORAL | Status: DC | PRN

## 2017-11-11 MED ORDER — LACTATED RINGERS IV SOLN
INTRAVENOUS | Status: DC
  Administered 2017-11-11: 09:00:00 via INTRAVENOUS

## 2017-11-11 MED ORDER — PROPOFOL 10 MG/ML IV BOLUS
INTRAVENOUS | Status: AC
  Filled 2017-11-11: qty 20

## 2017-11-11 MED ORDER — CEFAZOLIN SODIUM 1 G IJ SOLR
INTRAMUSCULAR | Status: AC
  Filled 2017-11-11: qty 20

## 2017-11-11 MED ORDER — SUCCINYLCHOLINE CHLORIDE 20 MG/ML IJ SOLN
INTRAMUSCULAR | Status: DC | PRN
  Administered 2017-11-11: 100 mg via INTRAVENOUS

## 2017-11-11 MED ORDER — ALUM & MAG HYDROXIDE-SIMETH 200-200-20 MG/5ML PO SUSP: 30.0000 mL | Freq: Four times a day (QID) | ORAL | Status: DC | PRN

## 2017-11-11 MED ORDER — LIDOCAINE-EPINEPHRINE 1 %-1:100000 IJ SOLN
INTRAMUSCULAR | Status: DC | PRN
  Administered 2017-11-11 (×2): 20 mL

## 2017-11-11 MED ORDER — 0.9 % SODIUM CHLORIDE (POUR BTL) OPTIME
TOPICAL | Status: DC | PRN
  Administered 2017-11-11: 1000 mL

## 2017-11-11 MED ORDER — ACETAMINOPHEN 10 MG/ML IV SOLN
INTRAVENOUS | Status: AC
  Filled 2017-11-11: qty 100

## 2017-11-11 MED ORDER — HYDROCHLOROTHIAZIDE 25 MG PO TABS
25.0000 mg | ORAL_TABLET | Freq: Every day | ORAL | Status: DC
  Administered 2017-11-12 – 2017-11-13 (×2): 25 mg via ORAL
  Filled 2017-11-11 (×2): qty 1

## 2017-11-11 MED ORDER — PROPOFOL 500 MG/50ML IV EMUL
INTRAVENOUS | Status: DC | PRN
  Administered 2017-11-11: 100 ug/kg/min via INTRAVENOUS

## 2017-11-11 MED ORDER — LISINOPRIL 20 MG PO TABS
20.0000 mg | ORAL_TABLET | Freq: Every day | ORAL | Status: DC
  Administered 2017-11-12 – 2017-11-13 (×2): 20 mg via ORAL
  Filled 2017-11-11 (×2): qty 1

## 2017-11-11 MED ORDER — ACETAMINOPHEN 325 MG PO TABS: 650.0000 mg | ORAL_TABLET | ORAL | Status: DC | PRN

## 2017-11-11 MED ORDER — EPHEDRINE SULFATE-NACL 50-0.9 MG/10ML-% IV SOSY
PREFILLED_SYRINGE | INTRAVENOUS | Status: DC | PRN
  Administered 2017-11-11: 10 mg via INTRAVENOUS
  Administered 2017-11-11: 15 mg via INTRAVENOUS

## 2017-11-11 MED ORDER — ONDANSETRON HCL 4 MG/2ML IJ SOLN
INTRAMUSCULAR | Status: DC | PRN
  Administered 2017-11-11: 4 mg via INTRAVENOUS

## 2017-11-11 MED ORDER — HYDROMORPHONE HCL 2 MG/ML IJ SOLN
INTRAMUSCULAR | Status: AC
  Filled 2017-11-11: qty 1

## 2017-11-11 MED ORDER — DEXAMETHASONE SODIUM PHOSPHATE 10 MG/ML IJ SOLN
INTRAMUSCULAR | Status: DC | PRN
  Administered 2017-11-11: 10 mg via INTRAVENOUS

## 2017-11-11 MED ORDER — ACETAMINOPHEN 10 MG/ML IV SOLN
INTRAVENOUS | Status: DC | PRN
  Administered 2017-11-11: 1000 mg via INTRAVENOUS

## 2017-11-11 MED ORDER — HYDROMORPHONE HCL 1 MG/ML IJ SOLN: 0.2500 mg | INTRAMUSCULAR | Status: DC | PRN

## 2017-11-11 MED ORDER — THROMBIN 5000 UNITS EX SOLR
CUTANEOUS | Status: AC
  Filled 2017-11-11: qty 10000

## 2017-11-11 MED ORDER — SODIUM CHLORIDE 0.9% FLUSH
3.0000 mL | Freq: Two times a day (BID) | INTRAVENOUS | Status: DC
  Administered 2017-11-11: 3 mL via INTRAVENOUS

## 2017-11-11 MED ORDER — SODIUM CHLORIDE 0.9 % IJ SOLN
INTRAMUSCULAR | Status: AC
  Filled 2017-11-11: qty 10

## 2017-11-11 MED ORDER — MIDAZOLAM HCL 2 MG/2ML IJ SOLN
INTRAMUSCULAR | Status: AC
  Filled 2017-11-11: qty 2

## 2017-11-11 MED ORDER — BISACODYL 10 MG RE SUPP: 10.0000 mg | Freq: Every day | RECTAL | Status: DC | PRN

## 2017-11-11 MED ORDER — CEFAZOLIN SODIUM-DEXTROSE 2-4 GM/100ML-% IV SOLN
INTRAVENOUS | Status: AC
  Filled 2017-11-11: qty 100

## 2017-11-11 MED ORDER — BUPIVACAINE HCL (PF) 0.5 % IJ SOLN
INTRAMUSCULAR | Status: DC | PRN
  Administered 2017-11-11 (×2): 20 mL

## 2017-11-11 SURGICAL SUPPLY — 82 items
BAG DECANTER FOR FLEXI CONT (MISCELLANEOUS) ×3 IMPLANT
BENZOIN TINCTURE PRP APPL 2/3 (GAUZE/BANDAGES/DRESSINGS) IMPLANT
BLADE CLIPPER SURG (BLADE) IMPLANT
CAGE MODULUS XLW 10X22X60 - 10 (Cage) ×3 IMPLANT
CARTRIDGE OIL MAESTRO DRILL (MISCELLANEOUS) IMPLANT
CLIP NEUROVISION LG (CLIP) ×3 IMPLANT
CONT SPEC 4OZ CLIKSEAL STRL BL (MISCELLANEOUS) IMPLANT
COVER BACK TABLE 24X17X13 BIG (DRAPES) IMPLANT
COVER BACK TABLE 60X90IN (DRAPES) IMPLANT
DECANTER SPIKE VIAL GLASS SM (MISCELLANEOUS) ×6 IMPLANT
DERMABOND ADVANCED (GAUZE/BANDAGES/DRESSINGS) ×2
DERMABOND ADVANCED .7 DNX12 (GAUZE/BANDAGES/DRESSINGS) ×4 IMPLANT
DIFFUSER DRILL AIR PNEUMATIC (MISCELLANEOUS) IMPLANT
DRAPE C-ARM 42X72 X-RAY (DRAPES) ×6 IMPLANT
DRAPE C-ARMOR (DRAPES) ×6 IMPLANT
DRAPE LAPAROTOMY 100X72X124 (DRAPES) ×6 IMPLANT
DRAPE POUCH INSTRU U-SHP 10X18 (DRAPES) IMPLANT
ELECT REM PT RETURN 9FT ADLT (ELECTROSURGICAL) ×6
ELECTRODE REM PT RTRN 9FT ADLT (ELECTROSURGICAL) ×4 IMPLANT
GAUZE SPONGE 4X4 12PLY STRL (GAUZE/BANDAGES/DRESSINGS) ×3 IMPLANT
GAUZE SPONGE 4X4 16PLY XRAY LF (GAUZE/BANDAGES/DRESSINGS) ×3 IMPLANT
GLOVE BIOGEL PI IND STRL 6.5 (GLOVE) ×4 IMPLANT
GLOVE BIOGEL PI IND STRL 7.0 (GLOVE) ×10 IMPLANT
GLOVE BIOGEL PI IND STRL 7.5 (GLOVE) ×6 IMPLANT
GLOVE BIOGEL PI IND STRL 8 (GLOVE) ×4 IMPLANT
GLOVE BIOGEL PI INDICATOR 6.5 (GLOVE) ×2
GLOVE BIOGEL PI INDICATOR 7.0 (GLOVE) ×5
GLOVE BIOGEL PI INDICATOR 7.5 (GLOVE) ×3
GLOVE BIOGEL PI INDICATOR 8 (GLOVE) ×2
GLOVE ECLIPSE 7.5 STRL STRAW (GLOVE) ×12 IMPLANT
GLOVE EXAM NITRILE LRG STRL (GLOVE) IMPLANT
GLOVE EXAM NITRILE XL STR (GLOVE) IMPLANT
GLOVE EXAM NITRILE XS STR PU (GLOVE) IMPLANT
GLOVE SURG SS PI 6.0 STRL IVOR (GLOVE) ×9 IMPLANT
GOWN STRL REUS W/ TWL LRG LVL3 (GOWN DISPOSABLE) ×4 IMPLANT
GOWN STRL REUS W/ TWL XL LVL3 (GOWN DISPOSABLE) ×10 IMPLANT
GOWN STRL REUS W/TWL 2XL LVL3 (GOWN DISPOSABLE) IMPLANT
GOWN STRL REUS W/TWL LRG LVL3 (GOWN DISPOSABLE) ×2
GOWN STRL REUS W/TWL XL LVL3 (GOWN DISPOSABLE) ×5
GUIDEWIRE NITINOL BEVEL TIP (WIRE) ×24 IMPLANT
KIT BASIN OR (CUSTOM PROCEDURE TRAY) ×3 IMPLANT
KIT DILATOR XLIF 5 (KITS) ×3 IMPLANT
KIT INFUSE X SMALL 1.4CC (Orthopedic Implant) ×3 IMPLANT
KIT POSITION SURG JACKSON T1 (MISCELLANEOUS) IMPLANT
KIT SURGICAL ACCESS MAXCESS 4 (KITS) ×3 IMPLANT
KIT TURNOVER KIT B (KITS) ×3 IMPLANT
MARKER SKIN DUAL TIP RULER LAB (MISCELLANEOUS) ×6 IMPLANT
MODULE EMG NEEDLE SSEP NVM5 (NEEDLE) ×3 IMPLANT
MODULE NVM5 NEXT GEN EMG (NEEDLE) ×3 IMPLANT
MODULUS XLW 12X22X60MM 10 (Spine Construct) ×6 IMPLANT
NEEDLE I-PASS III (NEEDLE) ×3 IMPLANT
NEEDLE SPNL 22GX3.5 QUINCKE BK (NEEDLE) ×3 IMPLANT
NS IRRIG 1000ML POUR BTL (IV SOLUTION) ×3 IMPLANT
OIL CARTRIDGE MAESTRO DRILL (MISCELLANEOUS)
PACK LAMINECTOMY NEURO (CUSTOM PROCEDURE TRAY) ×6 IMPLANT
PAD ARMBOARD 7.5X6 YLW CONV (MISCELLANEOUS) ×12 IMPLANT
PATTIES SURGICAL .5 X.5 (GAUZE/BANDAGES/DRESSINGS) IMPLANT
PATTIES SURGICAL .5 X1 (DISPOSABLE) IMPLANT
PATTIES SURGICAL 1X1 (DISPOSABLE) IMPLANT
ROD RELINE MAS LORD 5.5X110MM (Rod) ×3 IMPLANT
ROD RELINE MAS LORD 5.5X120MM (Rod) ×3 IMPLANT
SCREW LOCK RELINE 5.5 TULIP (Screw) ×24 IMPLANT
SCREW RELINE MAS POLY 6.5X40MM (Screw) ×6 IMPLANT
SCREW RELINE RED 6.5X45MM POLY (Screw) ×18 IMPLANT
SPONGE LAP 18X18 X RAY DECT (DISPOSABLE) IMPLANT
SPONGE LAP 4X18 RFD (DISPOSABLE) ×6 IMPLANT
SPONGE SURGIFOAM ABS GEL 100 (HEMOSTASIS) ×3 IMPLANT
STAPLER SKIN PROX WIDE 3.9 (STAPLE) ×3 IMPLANT
STRIP BIOACTIVE VITOSS 25X100X (Neuro Prosthesis/Implant) ×6 IMPLANT
STRIP BIOACTIVE VITOSS 25X52X4 (Orthopedic Implant) ×3 IMPLANT
STRIP CLOSURE SKIN 1/2X4 (GAUZE/BANDAGES/DRESSINGS) IMPLANT
SUT VIC AB 1 CT1 18XBRD ANBCTR (SUTURE) ×8 IMPLANT
SUT VIC AB 1 CT1 8-18 (SUTURE) ×4
SUT VIC AB 2-0 CP2 18 (SUTURE) ×21 IMPLANT
SUT VIC AB 2-0 CT1 18 (SUTURE) IMPLANT
SUT VIC AB 3-0 SH 8-18 (SUTURE) IMPLANT
SYR INSULIN 1ML 31GX6 SAFETY (SYRINGE) IMPLANT
TAPE CLOTH SURG 4X10 WHT LF (GAUZE/BANDAGES/DRESSINGS) ×3 IMPLANT
TOWEL GREEN STERILE (TOWEL DISPOSABLE) ×6 IMPLANT
TOWEL GREEN STERILE FF (TOWEL DISPOSABLE) IMPLANT
TRAY FOLEY MTR SLVR 16FR STAT (SET/KITS/TRAYS/PACK) ×3 IMPLANT
WATER STERILE IRR 1000ML POUR (IV SOLUTION) ×3 IMPLANT

## 2017-11-11 NOTE — Transfer of Care (Signed)
Immediate Anesthesia Transfer of Care Note  Patient: Bryan [name]  Procedure(s) Performed: LUMBAR TWO- LUMBAR THREE, LUMBAR THREE- LUMBAR FOUR, LUMBAR FOUR- LUMBAR FIVE ANTERIOLATERAL INTERBODY ARTHRODESIS (N/A Flank) LUMBAR PERCUTANEOUS PEDICLE SCREW PLACEMENT LUMBAR TWO-THREE, LUMBER THREE-FOUR, LUMBAR FOUR-FIVE (N/A Back)  Patient Location: PACU  Anesthesia Type:General  Level of Consciousness: lethargic and responds to stimulation  Airway & Oxygen Therapy: Patient Spontanous Breathing and Patient connected to nasal cannula oxygen  Post-op Assessment: Report given to RN  Post vital signs: Reviewed and stable  Last Vitals:  Vitals Value Taken Time  BP 120/76 11/11/2017  5:30 PM  Temp 36.6 C 11/11/2017  5:30 PM  Pulse 76 11/11/2017  5:31 PM  Resp 14 11/11/2017  5:31 PM  SpO2 97 % 11/11/2017  5:31 PM  Vitals shown include unvalidated device data.  Last Pain:  Vitals:   11/11/17 1730  TempSrc:   PainSc: Asleep      Patients Stated Pain Goal: 3 (41/28/78 6767)  Complications: No apparent anesthesia complications

## 2017-11-11 NOTE — Op Note (Signed)
11/11/2017  5:17 PM  PATIENT:  Bryan [name]  72 y.o. male  PRE-OPERATIVE DIAGNOSIS: Multilevel, multifactorial lumbar stenosis with neurogenic claudication; degenerative thoracolumbar scoliosis; lumbar spondylosis; lumbar degenerative disease; right lumbar radiculopathy with proximal right lower extremity weakness  POST-OPERATIVE DIAGNOSIS:  Multilevel, multifactorial lumbar stenosis with neurogenic claudication; degenerative thoracolumbar scoliosis; lumbar spondylosis; lumbar degenerative disease; right lumbar radiculopathy with proximal right lower extremity weakness  PROCEDURE:  Procedure(s):   1)  LUMBAR TWO- LUMBAR THREE, LUMBAR THREE- LUMBAR FOUR, LUMBAR FOUR- LUMBAR FIVE ANTERIOLATERAL INTERBODY ARTHRODESIS with Nuvasive modulus XLIF interbody implants, Vitoss BA, and infuse 2)  LUMBAR SEGMENTAL PERCUTANEOUS PEDICLE SCREW FIXATION LUMBAR TWO-THREE, LUMBER THREE-FOUR, LUMBAR FOUR-FIVE with Nuvasive Reline MAS reduction screws rods  SURGEON:   Jovita Gamma, MD  ASSISTANTS: Kary Kos, MD  ANESTHESIA:   general  EBL:  Total I/O In: 60 [I.V.:3000] Out: 1150 [Urine:950; Blood:200]  BLOOD ADMINISTERED:none  CELL SAVER GIVEN: Cell Saver technician felt that there was insufficient blood loss to process the collected blood  COUNT:  Correct per nursing staff for each portion of the procedure  DICTATION:  Prior to surgery, preoperative scoliosis x-rays and MRI scan were reviewed, and planning was done for 3 level anterior lateral interbody arthrodesis (XLIF) with percutaneous pedicle screw fixation.  Patient was brought to the operating room, placed under general endotracheal anesthesia.  The procedure was done with C-arm fluoroscopic imaging in AP and lateral projections.  Electrophysiologic monitoring was used throughout the procedure.  Hemostasis throughout the procedure was maintained with the use of bipolar cautery and electrocautery.  The patient was placed in a right side up  lateral decubitus position, and the table flexed to expand the lateral space between the costal margin and iliac crest.  Using the C-arm, we plan the incisions to approach the L2-3 L3-4 and L4-5 levels laterally.  The right lateral torso was then prepped with Betadine soap and solution and draped in sterile fashion.  The line of each of the incisions as well as a fourth incision posterior laterally were infiltrated with local anesthetic with epinephrine.    Incision made over the L4-5 level and carried down through the subcutaneous tissue.  The posterior lateral incision was then made, and we entered into the retroperitoneum, and identified the transverse processes over the lateral aspect of the spine, and then were able to guide entry from the lateral incision into the retroperitoneal space.  The first dilator was placed using C-arm fluoroscopic guidance over the lateral aspect of the L4-5 disc space; electrophysiologic stimulation was used to confirm that no neural structures were in close proximity.  We then gently tamped a K wire into the lateral aspect of the L4-5 disc space.  We then passed two further dilators, sequentially, over the initial dilator; testing each time with electrophysiologic stimulation.  We then passed the self-retaining retractor over the 3 dilators.  The K wire was kept in place, but the dilators removed.  The exposed tissues were again electrophysiologically emulated, and again no neural structures were found in close proximity.  And then we gently tamped the shim into the lateral annulus to secure the self-retaining retractor.  The K wire was then removed.  The lateral annulus was then incised, and the disc space entered.  A thorough discectomy was performed using a variety of pituitary rongeurs and curettes.  With C-arm fluoroscopic guidance we carefully opened the contralateral annulus.  We then used a series of gradually increasingly larger trials and eventually selected a 12 x  22 x  60 x 10 Nuvasive  Modulus XLIF interbody implant.  It was packed with Vitoss BA and infuse.  Glides were placed within the disc space, and the implant was gently tamped into position.  The glides were removed, and then the inserter was removed.  C-arm fluoroscopy showed good positioning of the interbody implant.  We then repeated this approach at the L3-4 level, and then the L2-3 level.  At L3-4 a 12 x 22 x 60 x 10 implant was used and at the L2-3 level a 10 x 22 x 60 x 10 implant was used.  We then proceeded with closure.  Deep fascia was closed with interrupted undyed 1 Vicryl suture.  The subcutaneous and subcuticular closed with interrupted inverted 2-0 undyed Vicryl suture.  The skin edges were approximated with Dermabond.  The patient was then transferred back to a stretcher, and then turned to a prone position on rolls on the operating room table.  The posterior lumbar region was then prepped with Betadine soap and solution and draped in a sterile fashion.  We continued to use C-arm fluoroscopy in AP and lateral projections and electrophysiologic monitoring during placement of the percutaneous pedicle screw fixation.  Entry points for the L2, L3, L4, and L5 pedicles were identified bilaterally.  Parasagittal incisions were marked bilaterally through the entry points on each side.  The line of the incisions were infiltrated with local acetic with epinephrine.  Bilateral parasagittal incisions were made, and hemostasis was maintained with bipolar cautery and electrocautery.  With C-arm fluoroscopic guidance, a cannulated trocar was passed into the pedicle and vertebral body bilaterally at each level.  Once in good position, the inner trocar was removed, and a K wire passed through the pedicle into the vertebral body on each side.  The outer trocar was then removed.  Self-tapping cannulated 6.5 millimeter screws were then passed over each of the K wires and the pedicle screws placed within the pedicles  and vertebral body bilaterally at each level.  We used 45 mm screws at L2, L3, and L4, and 40 mm screws at L5.  Once all 8 screws were in place, we measured the length of rod needed.  We selected pre-lordosed 5.5 mm rods, using 120 mm rod on the left and 110 mm rod on the right.  Once each of the rods were placed, locking caps were placed, securing the rods to the screws.  Once all 8 locking caps were placed, final tightening against the counter torque was performed.  C-arm fluoroscopy showed good pedicle screw placement and good rod placement.  The screw towers were then removed, and once hemostasis was established, we proceeded with closure.  Scarpa's fascia was closed with interrupted undyed 1 Vicryl sutures.  Subcutaneous and subcuticular layer closed with interrupted inverted 2-0 undyed Vicryl sutures.  Skin edges were approximated with Dermabond.  A dressing of sterile gauze and Hypafix was applied to the posterior lumbar incisions.  Following surgery the patient was turned back to a supine position, to be reversed from the anesthetic, extubated, and transferred to the recovery room for further care.  PLAN OF CARE: Admit to inpatient   PATIENT DISPOSITION:  PACU - hemodynamically stable.   Delay start of Pharmacological VTE agent (>24hrs) due to surgical blood loss or risk of bleeding:  yes

## 2017-11-11 NOTE — Anesthesia Preprocedure Evaluation (Addendum)
Anesthesia Evaluation  Patient identified by MRN, date of birth, ID band Patient awake    Reviewed: Allergy & Precautions, NPO status , Patient's Chart, lab work & pertinent test results  Airway Mallampati: II  TM Distance: >3 FB Neck ROM: Full    Dental  (+) Dental Advisory Given   Pulmonary neg pulmonary ROS,    breath sounds clear to auscultation       Cardiovascular hypertension, Pt. on medications  Rhythm:Regular Rate:Normal     Neuro/Psych negative neurological ROS     GI/Hepatic Neg liver ROS, GERD  ,  Endo/Other  negative endocrine ROS  Renal/GU negative Renal ROS     Musculoskeletal  (+) Arthritis ,   Abdominal   Peds  Hematology negative hematology ROS (+)   Anesthesia Other Findings   Reproductive/Obstetrics                             Lab Results  Component Value Date   WBC 7.5 11/10/2017   HGB 15.7 11/10/2017   HCT 48.4 11/10/2017   MCV 91.3 11/10/2017   PLT 316 11/10/2017   Lab Results  Component Value Date   CREATININE 1.36 (H) 11/10/2017   BUN 27 (H) 11/10/2017   NA 138 11/10/2017   K 3.6 11/10/2017   CL 101 11/10/2017   CO2 26 11/10/2017    Anesthesia Physical Anesthesia Plan  ASA: II  Anesthesia Plan: General   Post-op Pain Management:    Induction: Intravenous  PONV Risk Score and Plan: 2 and Ondansetron, Dexamethasone and Treatment may vary due to age or medical condition  Airway Management Planned: Oral ETT  Additional Equipment:   Intra-op Plan:   Post-operative Plan: Extubation in OR  Informed Consent: I have reviewed the patients History and Physical, chart, labs and discussed the procedure including the risks, benefits and alternatives for the proposed anesthesia with the patient or authorized representative who has indicated his/her understanding and acceptance.   Dental advisory given  Plan Discussed with: CRNA  Anesthesia Plan  Comments:         Anesthesia Quick Evaluation

## 2017-11-11 NOTE — Progress Notes (Signed)
`   Vitals:   11/11/17 1829 11/11/17 1830 11/11/17 1845 11/11/17 1900  BP:  (!) 145/83 (!) 141/76 (!) 146/85  Pulse: 84 82 89 92  Resp: (!) 8 12 15 16   Temp:    (!) 97.4 F (36.3 C)  TempSrc:      SpO2: 96% 94% 96% 97%    CBC Recent Labs    11/10/17 1430  WBC 7.5  HGB 15.7  HCT 48.4  PLT 316   BMET Recent Labs    11/10/17 1430  NA 138  K 3.6  CL 101  CO2 26  GLUCOSE 156*  BUN 27*  CREATININE 1.36*  CALCIUM 10.2    Patient resting comfortably in PACU.  Drowsy.  Following commands.  Moving all 4 extremities well.  Plan: Doing well following surgery.  Once up on unit and anesthesia clears, will begin ambulation in the halls.  Hosie Spangle, MD 11/11/2017, 7:12 PM

## 2017-11-11 NOTE — Anesthesia Procedure Notes (Signed)
Procedure Name: Intubation Date/Time: 11/11/2017 9:42 AM Performed by: Barrington Ellison, CRNA Pre-anesthesia Checklist: Patient identified, Emergency Drugs available, Suction available and Patient being monitored Patient Re-evaluated:Patient Re-evaluated prior to induction Oxygen Delivery Method: Circle System Utilized Preoxygenation: Pre-oxygenation with 100% oxygen Induction Type: IV induction Ventilation: Mask ventilation without difficulty Laryngoscope Size: Mac and 4 Tube type: Oral Tube size: 7.5 mm Number of attempts: 1 Airway Equipment and Method: Stylet and Oral airway Placement Confirmation: ETT inserted through vocal cords under direct vision,  positive ETCO2 and breath sounds checked- equal and bilateral Secured at: 23 cm Tube secured with: Tape Dental Injury: Teeth and Oropharynx as per pre-operative assessment

## 2017-11-11 NOTE — H&P (Signed)
Subjective: Patient is a 72 y.o. right-handed white male who is admitted for treatment of multilevel lumbar degenerative disc disease and spondylosis, with degenerative scoliosis, and recurrent stenosis, worse on the right side at the L2-3, L3-4, and L4-5 levels.  Patient's been having difficulties with persistent disabling right lumbar radicular pain.  He was treated with nonsurgical measures without relief.  These included physical therapy.  Patient has since developed weakness in the right iliopsoas 3 to 4-/5 as well as atrophy of the anterior right thigh musculature.  He is admitted now for a 3 level L2-3, L3-4, and L4-5 anterior lateral lumbar interbody arthrodesis (XLIF) procedure from the right side with percutaneous pedicle screw fixation from L2-L5.    Patient Active Problem List   Diagnosis Date Noted  . PROSTATE SPECIFIC ANTIGEN, ELEVATED 07/19/2009  . BACK PAIN 06/11/2007  . Essential hypertension 05/21/2007  . Osteoarthritis 05/21/2007   Past Medical History:  Diagnosis Date  . Anxiety   . Cancer ([place])    facial skin cancer  . Depression   . GERD (gastroesophageal reflux disease)    takes tums rarely  . Hypertension   . Osteoarthritis   . Spinal stenosis     Past Surgical History:  Procedure Laterality Date  . ACHILLES TENDON SURGERY  02/2006  . COLONOSCOPY  2003  . COLONOSCOPY    . KNEE SURGERY     X2  . SPINE SURGERY  12-08, 5-10  . TOTAL HIP ARTHROPLASTY      Medications Prior to Admission  Medication Sig Dispense Refill Last Dose  . acetaminophen (TYLENOL) 500 MG tablet Take 1,000 mg by mouth every 6 (six) hours as needed for moderate pain.    11/10/2017 at Unknown time  . lisinopril-hydrochlorothiazide (PRINZIDE,ZESTORETIC) 20-25 MG tablet TAKE (1) TABLET DAILY. (Patient taking differently: Take 1 tablet by mouth daily. ) 90 tablet 4 11/10/2017 at Unknown time  . meloxicam (MOBIC) 15 MG tablet Take 1 tablet (15 mg total) by mouth daily. 90 tablet 3 11/10/2017 at  Unknown time  . tiZANidine (ZANAFLEX) 4 MG tablet TAKE 1 TABLET THREE TIMES DAILY AS NEEDED FOR MUSCLE SPASMS. (Patient taking differently: Take 4 mg by mouth at night as needed for muscle spasms) 60 tablet 0 11/11/2017 at Unknown time  . traMADol (ULTRAM) 50 MG tablet Take 50 mg by mouth 2 (two) times daily as needed for moderate pain.    11/11/2017 at Unknown time   Allergies  Allergen Reactions  . Codeine Other (See Comments)    "made me sick"    Social History   Tobacco Use  . Smoking status: Never Smoker  . Smokeless tobacco: Never Used  Substance Use Topics  . Alcohol use: No    Family History  Problem Relation Age of Onset  . Diabetes Mother   . Coronary artery disease Mother   . Hypertension Mother   . Colon polyps Neg Hx   . Esophageal cancer Neg Hx   . Rectal cancer Neg Hx   . Stomach cancer Neg Hx      Review of Systems Pertinent items noted in HPI and remainder of comprehensive ROS otherwise negative.  Objective: Vital signs in last 24 hours: Temp:  [97.6 F (36.4 C)-98.1 F (36.7 C)] 97.6 F (36.4 C) (05/29 0827) Pulse Rate:  [73-94] 73 (05/29 0827) Resp:  [20] 20 (05/29 0827) BP: (118-127)/(78-82) 127/82 (05/29 0827) SpO2:  [98 %-100 %] 98 % (05/29 0827) Weight:  [90.8 kg (200 lb 3.2 oz)] 90.8 kg (200  lb 3.2 oz) (05/28 1402)  EXAM: Patient is a well-developed well-nourished white male, in discomfort but no acute distress.   Lungs are clear to auscultation , the patient has symmetrical respiratory excursion. Heart has a regular rate and rhythm normal S1 and S2 no murmur.   Abdomen is soft nontender nondistended bowel sounds are present. Extremity examination shows no clubbing cyanosis or edema. Motor examination shows the left iliopsoas is 5/5 and the right iliopsoas is 3 to 4-/5, the remainder of the lower extremity strength is 5/5 through the quadriceps dorsiflexor extensor hallicus  longus and plantar flexor bilaterally. Sensation is intact to pinprick in the  distal lower extremities. Reflexes are symmetrical bilaterally. No pathologic reflexes are present. Patient's gait and stance favors the right lower extremity.   Data Review:CBC    Component Value Date/Time   WBC 7.5 11/10/2017 1430   RBC 5.30 11/10/2017 1430   HGB 15.7 11/10/2017 1430   HCT 48.4 11/10/2017 1430   PLT 316 11/10/2017 1430   MCV 91.3 11/10/2017 1430   MCV 87.9 07/29/2015 1025   MCH 29.6 11/10/2017 1430   MCHC 32.4 11/10/2017 1430   RDW 12.5 11/10/2017 1430   LYMPHSABS 1.4 01/14/2017 0749   MONOABS 0.5 01/14/2017 0749   EOSABS 0.2 01/14/2017 0749   BASOSABS 0.1 01/14/2017 0749                          BMET    Component Value Date/Time   NA 138 11/10/2017 1430   K 3.6 11/10/2017 1430   CL 101 11/10/2017 1430   CO2 26 11/10/2017 1430   GLUCOSE 156 (H) 11/10/2017 1430   GLUCOSE 98 05/26/2006 0848   BUN 27 (H) 11/10/2017 1430   CREATININE 1.36 (H) 11/10/2017 1430   CALCIUM 10.2 11/10/2017 1430   GFRNONAA 52 (L) 11/10/2017 1430   GFRAA 60 (L) 11/10/2017 1430     Assessment/Plan: Patient with disabling right lumbar radiculopathy secondary to recurrent lumbar stenosis with advanced degenerative disease and spondylosis and degenerative scoliosis.  Patient is admitted now for lumbar decompression and stabilization.  I've discussed with the patient the nature of his condition, the nature the surgical procedure, the typical length of surgery, hospital stay, and overall recuperation, the limitations postoperatively, and risks of surgery. I discussed risks including risks of infection, bleeding, possibly need for transfusion, the risk of nerve root dysfunction with pain, weakness, numbness, or paresthesias, the risk of dural tear and CSF leakage and possible need for further surgery, the risk of failure of the arthrodesis and possibly for further surgery, the risk of anesthetic complications including myocardial infarction, stroke, pneumonia, and death. We discussed the need  for postoperative immobilization in a lumbar brace. Understanding all this the patient does wish to proceed with surgery and is admitted for such.   Hosie Spangle, MD 11/11/2017 9:14 AM

## 2017-11-12 ENCOUNTER — Other Ambulatory Visit: Payer: Self-pay | Admitting: Neurosurgery

## 2017-11-12 MED FILL — Thrombin For Soln 20000 Unit: CUTANEOUS | Qty: 1 | Status: AC

## 2017-11-12 NOTE — Anesthesia Postprocedure Evaluation (Signed)
Anesthesia Post Note  Patient: Bryan [name]  Procedure(s) Performed: LUMBAR TWO- LUMBAR THREE, LUMBAR THREE- LUMBAR FOUR, LUMBAR FOUR- LUMBAR FIVE ANTERIOLATERAL INTERBODY ARTHRODESIS (N/A Flank) LUMBAR PERCUTANEOUS PEDICLE SCREW PLACEMENT LUMBAR TWO-THREE, LUMBER THREE-FOUR, LUMBAR FOUR-FIVE (N/A Back)     Patient location during evaluation: PACU Anesthesia Type: General Level of consciousness: awake and alert Pain management: pain level controlled Vital Signs Assessment: post-procedure vital signs reviewed and stable Respiratory status: spontaneous breathing, nonlabored ventilation, respiratory function stable and patient connected to nasal cannula oxygen Cardiovascular status: blood pressure returned to baseline and stable Postop Assessment: no apparent nausea or vomiting Anesthetic complications: no    Last Vitals:  Vitals:   11/11/17 2333 11/12/17 0406  BP: (!) 143/85 (!) 150/86  Pulse: 85 83  Resp: 18 20  Temp: 36.9 C 37.1 C  SpO2: 97% 96%    Last Pain:  Vitals:   11/12/17 0628  TempSrc:   PainSc: 4                  Tiajuana Amass

## 2017-11-12 NOTE — Progress Notes (Signed)
Vitals:   11/11/17 1951 11/11/17 2333 11/12/17 0406 11/12/17 0817  BP: (!) 145/93 (!) 143/85 (!) 150/86 (!) 137/91  Pulse: 84 85 83 88  Resp: 20 18 20 18   Temp: 98 F (36.7 C) 98.4 F (36.9 C) 98.7 F (37.1 C) 98 F (36.7 C)  TempSrc: Oral Oral Oral   SpO2: 98% 97% 96% 100%    CBC Recent Labs    11/10/17 1430  WBC 7.5  HGB 15.7  HCT 48.4  PLT 316   BMET Recent Labs    11/10/17 1430  NA 138  K 3.6  CL 101  CO2 26  GLUCOSE 156*  BUN 27*  CREATININE 1.36*  CALCIUM 10.2    Patient resting in bed, comfortable.  Has been up and ambulating in the halls.  Still some proximal right lower extremity weakness, but the disabling radicular pain has been relieved.  He does though describe a good bit of soreness around the incisional areas both on the lateral right torso as well as his low back.  Lateral right torso incisions are healing nicely, although there is moderate ecchymosis around them.  The dressing remains dry and intact on the low back.  Foley was DC'd 6 hours ago, and the patient has voided several times.  He has mild dysuria.  We have encouraged patient to continue to be ambulating regularly in the halls.  Plan: Continue to recover following extensive surgery yesterday.  Encouraged to ambulate.  Hosie Spangle, MD 11/12/2017, 10:13 AM

## 2017-11-13 MED ORDER — OXYCODONE-ACETAMINOPHEN 5-325 MG PO TABS: 1.0000 | ORAL_TABLET | ORAL | 0 refills | Status: DC | PRN

## 2017-11-13 MED ORDER — CYCLOBENZAPRINE HCL 10 MG PO TABS: 10.0000 mg | ORAL_TABLET | Freq: Three times a day (TID) | ORAL | Status: DC | PRN

## 2017-11-13 MED ORDER — CYCLOBENZAPRINE HCL 5 MG PO TABS: 5.0000 mg | ORAL_TABLET | Freq: Three times a day (TID) | ORAL | Status: DC | PRN

## 2017-11-13 MED ORDER — OXYCODONE-ACETAMINOPHEN 5-325 MG PO TABS: 1.0000 | ORAL_TABLET | ORAL | Status: DC | PRN

## 2017-11-13 MED ORDER — CYCLOBENZAPRINE HCL 10 MG PO TABS: 10.0000 mg | ORAL_TABLET | Freq: Three times a day (TID) | ORAL | 1 refills | Status: DC | PRN

## 2017-11-13 MED ORDER — HYDROCODONE-ACETAMINOPHEN 5-325 MG PO TABS: 1.0000 | ORAL_TABLET | ORAL | 0 refills | Status: DC | PRN

## 2017-11-13 MED FILL — Heparin Sodium (Porcine) Inj 1000 Unit/ML: INTRAMUSCULAR | Qty: 30 | Status: AC

## 2017-11-13 MED FILL — Sodium Chloride IV Soln 0.9%: INTRAVENOUS | Qty: 1000 | Status: AC

## 2017-11-13 NOTE — Discharge Summary (Signed)
Physician Discharge Summary  Patient ID: Bryan [name] MRN: [id number] DOB/AGE: 70-Sep-1947 70 y.o.  Admit date: 11/11/2017 Discharge date: 11/13/2017  Admission Diagnoses:  Multilevel, multifactorial lumbar stenosis with neurogenic claudication; degenerative thoracolumbar scoliosis; lumbar spondylosis; lumbar degenerative disease; right lumbar radiculopathy with proximal right lower extremity weakness  Discharge Diagnoses:  Multilevel, multifactorial lumbar stenosis with neurogenic claudication; degenerative thoracolumbar scoliosis; lumbar spondylosis; lumbar degenerative disease; right lumbar radiculopathy with proximal right lower extremity weakness  Active Problems:   Lumbar stenosis with neurogenic claudication   Discharged Condition: good  Hospital Course: Patient was admitted, underwent a 3 level L2-3, L3-4, and L4-5 ex lift procedure with percutaneous pedicle screw fixation.  Postoperatively he has had significant discomfort around his low back, flank and in general around the torso.  However the radicular pain into the right lower extremity has been lessened, however he still has significant weakness in the right iliopsoas.  He has been up and ambulating in the halls using a single-point cane.  He is voiding well.  He is being discharged home with instructions regarding wound care and activities following discharge.  He is scheduled to follow-up with me with x-rays in late June.  We are going to have him resume outpatient physical therapy in a week and a half.  We will have him resume therapy at [facility] outpatient PT at [facility], 2-3 times per week for 4 to 6 weeks to work on lumbar reconditioning following fusion, proximal lower extremity strengthening, etc.  I have asked that it started on Monday, June 10.  Discharge Exam: Blood pressure (!) 144/92, pulse 89, temperature 98 F (36.7 C), temperature source Oral, resp. rate 20, SpO2 99 %.  Disposition: Discharge disposition: 01-Home or  Self Care       Discharge Instructions    Discharge wound care:   Complete by:  As directed    Leave the wound open to air. Shower daily with the wound uncovered. Water and soapy water should run over the incision area. Do not wash directly on the incision for 2 weeks. Remove the glue after 2 weeks.   Driving Restrictions   Complete by:  As directed    No driving for 2 weeks. May ride in the car locally now. May begin to drive locally in 2 weeks.   Other Restrictions   Complete by:  As directed    Walk gradually increasing distances out in the fresh air at least twice a day. Walking additional 6 times inside the house, gradually increasing distances, daily. No bending, lifting, or twisting. Perform activities between shoulder and waist height (that is at counter height when standing or table height when sitting).        SignedHosie Spangle 11/13/2017, 10:25 AM

## 2017-11-18 ENCOUNTER — Encounter (HOSPITAL_COMMUNITY): Payer: Self-pay | Admitting: Neurosurgery

## 2017-11-24 ENCOUNTER — Ambulatory Visit: Payer: PPO

## 2017-11-30 ENCOUNTER — Ambulatory Visit: Payer: PPO | Attending: Neurosurgery | Admitting: Physical Therapy

## 2017-11-30 ENCOUNTER — Encounter: Payer: Self-pay | Admitting: Physical Therapy

## 2017-11-30 ENCOUNTER — Other Ambulatory Visit: Payer: Self-pay

## 2017-11-30 DIAGNOSIS — M25551 Pain in right hip: Secondary | ICD-10-CM | POA: Diagnosis not present

## 2017-11-30 DIAGNOSIS — M545 Low back pain: Secondary | ICD-10-CM | POA: Diagnosis not present

## 2017-11-30 DIAGNOSIS — M62830 Muscle spasm of back: Secondary | ICD-10-CM | POA: Insufficient documentation

## 2017-11-30 DIAGNOSIS — R2689 Other abnormalities of gait and mobility: Secondary | ICD-10-CM | POA: Insufficient documentation

## 2017-11-30 NOTE — Therapy (Signed)
[facility] [address], [place], [place], [address] Phone: [phone]   Fax:  [phone]  Physical Therapy Evaluation  Patient Details  Name: Bryan [name] MRN: [id number] Date of Birth: 1945/10/28 Referring Provider: Jovita Gamma, MD    Encounter Date: 11/30/2017  PT End of Session - 11/30/17 1214    Visit Number  1    Number of Visits  16    Date for PT Re-Evaluation  01/25/18    Authorization Type  [employer]    Authorization Time Period  11/30/17 to 01/25/18    PT Start Time  1102    PT Stop Time  1149    PT Time Calculation (min)  47 min    Equipment Utilized During Treatment  Back brace    Activity Tolerance  Patient limited by pain    Behavior During Therapy  [facility] for tasks assessed/performed       Past Medical History:  Diagnosis Date  . Anxiety   . Cancer ([place])    facial skin cancer  . Depression   . GERD (gastroesophageal reflux disease)    takes tums rarely  . Hypertension   . Osteoarthritis   . Spinal stenosis     Past Surgical History:  Procedure Laterality Date  . ACHILLES TENDON SURGERY  02/2006  . ANTERIOR LAT LUMBAR FUSION N/A 11/11/2017   Procedure: LUMBAR TWO- LUMBAR THREE, LUMBAR THREE- LUMBAR FOUR, LUMBAR FOUR- LUMBAR FIVE ANTERIOLATERAL INTERBODY ARTHRODESIS;  Surgeon: Jovita Gamma, MD;  Location: [place];  Service: Neurosurgery;  Laterality: N/A;  . COLONOSCOPY  2003  . COLONOSCOPY    . KNEE SURGERY     X2  . LUMBAR PERCUTANEOUS PEDICLE SCREW 3 LEVEL N/A 11/11/2017   Procedure: LUMBAR PERCUTANEOUS PEDICLE SCREW PLACEMENT LUMBAR TWO-THREE, LUMBER THREE-FOUR, LUMBAR FOUR-FIVE;  Surgeon: Jovita Gamma, MD;  Location: [facility];  Service: Neurosurgery;  Laterality: N/A;  . SPINE SURGERY  12-08, 5-10  . TOTAL HIP ARTHROPLASTY      There were no vitals filed for this visit.   Subjective Assessment - 11/30/17 1108    Subjective  Pt reports that he had a lumbar  fusion 11/13/17. He has been having alot of pain and still has weakness and pain in the Rt leg. He has been trying to walk, but he is only able to for about 5 minutes at a time.     Currently in Pain?  Yes    Pain Score  8     Pain Location  Back    Pain Orientation  Mid;Lower    Pain Descriptors / Indicators  Aching;Sore    Pain Type  Surgical pain    Pain Radiating Towards  RLE (lateral thigh)    Pain Onset  More than a month ago    Pain Frequency  Constant    Aggravating Factors   any position is uncomfortable     Pain Relieving Factors  medication helps some          [facility] PT Assessment - 11/30/17 0001      Assessment   Medical Diagnosis  decondition s/p lumbar fusion     Referring Provider  Jovita Gamma, MD     Onset Date/Surgical Date  11/13/17    Next MD Visit  12/10/17    Prior Therapy  before surgery      Precautions   Precautions  Back      Restrictions   Weight Bearing Restrictions  No  Balance Screen   Has the patient fallen in the past 6 months  No    Has the patient had a decrease in activity level because of a fear of falling?   No    Is the patient reluctant to leave their home because of a fear of falling?   No      Prior Function   Level of Independence  Needs assistance with gait      Sensation   Light Touch  Appears Intact      Posture/Postural Control   Posture Comments  sitting slouched with BUE for support       ROM / Strength   AROM / PROM / Strength  AROM;Strength      AROM   Overall AROM Comments  deferred due to post op restrictions       Strength   Overall Strength Comments  unable to fully assess hip strength due to high levels of pain    Strength Assessment Site  Hip;Knee;Ankle    Right/Left Hip  Right;Left    Right Hip Flexion  2/5    Left Hip Flexion  5/5    Right/Left Knee  Right;Left    Right Knee Flexion  4/5    Right Knee Extension  2/5    Left Knee Flexion  5/5    Left Knee Extension  5/5      Flexibility   Soft  Tissue Assessment /Muscle Length  yes    Hamstrings  lacking 50 deg Lt and Rt during 90/90 testing       Palpation   Palpation comment  tenderness with palpation along Rt vastus lateralis, glute med      Special Tests    Special Tests  Hip Special Tests    Hip Special Tests   Saralyn Pilar (FABER) Test;Hip Scouring      Saralyn Pilar [facility]) Test   Findings  Positive    Side  Right      Hip Scouring   Findings  Positive    Side  Right      Transfers   Five time sit to stand comments   20 sec, UE support    Comments  pt needing MinA to advance RLE during sit to/from supine transfers      Ambulation/Gait   Stairs  Yes    Stair Management Technique  With cane;Step to pattern    Gait Comments  using SPC, decreased step length on the Lt, poor weight acceptance on the Rt                Objective measurements completed on examination: See above findings.      [facility] PT Treatment/Exercise - 11/30/17 0001      Knee/Hip Exercises: Seated   Heel Slides  AROM;Right;1 set;20 reps             PT Education - 11/30/17 1213    Education Details  eval findings/POC; HEP implemented and reviewed with pt and his spouse; importance of using log roll during transitions in/out of bed; ice parameters    Person(s) Educated  Patient;Spouse    Methods  Explanation;Verbal cues;Demonstration    Comprehension  Verbalized understanding;Returned demonstration       PT Short Term Goals - 11/30/17 1224      PT SHORT TERM GOAL #1   Title  Pt will demo consistency and independence with his HEP to decrease pain and improve RLE strength.    Time  2  Period  Weeks    Status  New    Target Date  12/14/17      PT SHORT TERM GOAL #2   Title  Pt will demo proper log roll during transitions in/out of supine which will decrease caregiver burden and improve his efficiency throughout the session.    Time  2    Period  Weeks    Status  New      PT SHORT TERM GOAL #3   Title  Pt will be able  to complete sit to/from supine transfers independently throughout his session to decrease caregiver support at home.     Time  4    Period  Weeks    Status  New    Target Date  12/28/17        PT Long Term Goals - 11/30/17 1225      PT LONG TERM GOAL #1   Title  Pt will demo improved Rt quadriceps strength to atleast 4/5 MMT which will assist with ambulating and getting in out of bed independently.     Time  8    Period  Weeks    Status  New    Target Date  01/25/18      PT LONG TERM GOAL #2   Title  Pt will be able to ascend and descend 4-5 steps without an AD and with reciprocal stepping pattern, 2/3 trials.    Baseline  --    Time  8    Period  Weeks    Status  New      PT LONG TERM GOAL #3   Title  Pt will be able to walk community distances without an AD to improve his independence and well being.     Time  8    Period  Weeks    Status  New      PT LONG TERM GOAL #4   Title  Pt will complete 5x sit to stand in less than 14 sec without UE support, which will reflect an increase in functional strength and power.     Baseline  --    Time  8    Period  Weeks    Status  New      PT LONG TERM GOAL #5   Title  Pt will report atleast 40% reduction in Rt hip/knee/low back pain from the start of therapy which will improve his quality of life.     Time  8    Period  Weeks    Status  New             Plan - 11/30/17 1216    Clinical Impression Statement  Pt is a pleasant 72 y.o M referred to OPPT s/p lumbar fusion on 11/13/17. Since the surgery, he reports high levels of low back, Rt hip and Rt knee pain in addition to RLE weakness. He currently demonstrates mobility deficits, requiring MinA to advance his RLE onto the mat table. Strength testing was limited this session secondary to high pain levels, but there is significant weakness of the Rt hip flexion and knee extensors with manual muscle testing. Pt's Rt hip and knee pain was further evaluated, noting (+) scours, (+)  faber, (+) pain report with squatting and (+) pain/limitation with Rt hip internal rotation, indicating pain that is secondary to hip osteoarthritis or other hip pathology. Long axis distraction decreased pt's pain in the Rt hip/groin region. He would greatly benefit from skilled PT to address  limitations in mobility, strength and core stability/endurance, which will decrease his need for caregiver support and promote his return to daily activity at home and in the community.     History and Personal Factors relevant to plan of care:  3 level lumbar fusion on 11/13/17, anxiety/depression    Clinical Presentation  Stable    Clinical Presentation due to:  typical post op presentation    Clinical Decision Making  Low    Rehab Potential  Good    PT Frequency  2x / week    PT Duration  8 weeks    PT Treatment/Interventions  ADLs/Self Care Home Management;Cryotherapy;Electrical Stimulation;Moist Heat;Therapeutic exercise;Therapeutic activities;Functional mobility training;Stair training;Gait training;Ultrasound;Patient/family education;Manual techniques;Taping;Dry needling;Passive range of motion;Neuromuscular re-education    PT Next Visit Plan  review log roll; update HEP (hip flexor and quad strengthening), neural glides, abdominal bracing     PT Home Exercise Plan  seated LAQ slides, supine heel slides    Consulted and Agree with Plan of Care  Patient;Family member/caregiver    Family Member Consulted  spouse        Patient will benefit from skilled therapeutic intervention in order to improve the following deficits and impairments:  Abnormal gait, Hypomobility, Decreased strength, Pain, Increased muscle spasms, Difficulty walking, Decreased mobility, Decreased range of motion, Improper body mechanics, Postural dysfunction, Impaired flexibility, Decreased activity tolerance, Decreased balance, Decreased scar mobility  Visit Diagnosis: Acute midline low back pain, with sciatica presence  unspecified  Pain in right hip  Other abnormalities of gait and mobility  Muscle spasm of back     Problem List Patient Active Problem List   Diagnosis Date Noted  . Lumbar stenosis with neurogenic claudication 11/11/2017  . PROSTATE SPECIFIC ANTIGEN, ELEVATED 07/19/2009  . BACK PAIN 06/11/2007  . Essential hypertension 05/21/2007  . Osteoarthritis 05/21/2007    1:14 PM,11/30/17 Sherol Dade PT, DPT [facility] [address] [place], [place], [address] Phone: [phone]   Fax:  [phone]  Name: Bryan [name] MRN: [id number] Date of Birth: July 14, 1945

## 2017-12-01 ENCOUNTER — Ambulatory Visit: Payer: PPO | Admitting: Physical Therapy

## 2017-12-03 ENCOUNTER — Encounter: Payer: Self-pay | Admitting: Physical Therapy

## 2017-12-03 ENCOUNTER — Ambulatory Visit: Payer: PPO | Admitting: Physical Therapy

## 2017-12-03 DIAGNOSIS — M545 Low back pain: Secondary | ICD-10-CM

## 2017-12-03 DIAGNOSIS — M25551 Pain in right hip: Secondary | ICD-10-CM

## 2017-12-03 DIAGNOSIS — R2689 Other abnormalities of gait and mobility: Secondary | ICD-10-CM

## 2017-12-03 NOTE — Patient Instructions (Signed)
Access Code: [email or web address]  URL: [email or web address]  Date: 12/03/2017  Prepared by: Elly Modena   Exercises  Hooklying Isometric Clamshell - 10 reps - 3x daily - 7x weekly  Supine Heel Slides - 15 reps - 3x daily - 7x weekly  Supine March - 5 reps - 3x daily - 7x weekly  Seated Long Arc Quad - 5 reps - 2 sets - 1x daily - 7x weekly    [facility] [address] [place], [place] [address] Phone # [phone] Fax [phone]

## 2017-12-03 NOTE — Therapy (Signed)
[facility] [address], [place], [place], [address] Phone: [phone]   Fax:  [phone]  Physical Therapy Treatment  Patient Details  Name: Bryan [name] MRN: [id number] Date of Birth: Sep 06, 1945 Referring Provider: Jovita Gamma, MD    Encounter Date: 12/03/2017  PT End of Session - 12/03/17 1112    Visit Number  2    Number of Visits  16    Date for PT Re-Evaluation  01/25/18    Authorization Type  [employer]    Authorization Time Period  11/30/17 to 01/25/18    PT Start Time  1101    PT Stop Time  1150 7 min untimed ice    PT Time Calculation (min)  49 min    Equipment Utilized During Treatment  Back brace    Activity Tolerance  Patient limited by pain    Behavior During Therapy  [facility] for tasks assessed/performed       Past Medical History:  Diagnosis Date  . Anxiety   . Cancer ([place])    facial skin cancer  . Depression   . GERD (gastroesophageal reflux disease)    takes tums rarely  . Hypertension   . Osteoarthritis   . Spinal stenosis     Past Surgical History:  Procedure Laterality Date  . ACHILLES TENDON SURGERY  02/2006  . ANTERIOR LAT LUMBAR FUSION N/A 11/11/2017   Procedure: LUMBAR TWO- LUMBAR THREE, LUMBAR THREE- LUMBAR FOUR, LUMBAR FOUR- LUMBAR FIVE ANTERIOLATERAL INTERBODY ARTHRODESIS;  Surgeon: Jovita Gamma, MD;  Location: [place];  Service: Neurosurgery;  Laterality: N/A;  . COLONOSCOPY  2003  . COLONOSCOPY    . KNEE SURGERY     X2  . LUMBAR PERCUTANEOUS PEDICLE SCREW 3 LEVEL N/A 11/11/2017   Procedure: LUMBAR PERCUTANEOUS PEDICLE SCREW PLACEMENT LUMBAR TWO-THREE, LUMBER THREE-FOUR, LUMBAR FOUR-FIVE;  Surgeon: Jovita Gamma, MD;  Location: [place];  Service: Neurosurgery;  Laterality: N/A;  . SPINE SURGERY  12-08, 5-10  . TOTAL HIP ARTHROPLASTY      There were no vitals filed for this visit.  Subjective Assessment - 12/03/17 1104    Subjective  Pt reports that he  has been working on his exercises in the bed. He sat unsupported at lunch yesterday and this really flared up his back pain.     Currently in Pain?  Yes    Pain Score  7     Pain Orientation  Mid;Lower    Pain Descriptors / Indicators  Aching    Pain Type  Surgical pain    Pain Radiating Towards  Rt lateral thigh     Pain Onset  More than a month ago    Pain Frequency  Constant    Aggravating Factors   all positions are uncomfortable     Pain Relieving Factors  medication and ice               [facility] Adult PT Treatment/Exercise - 12/03/17 0001      Lumbar Exercises: Supine   Clam  10 reps;Limitations    Clam Limitations  x2 sets each LE, green TB       Knee/Hip Exercises: Seated   Long Arc Quad  Right;1 set;5 reps      Knee/Hip Exercises: Supine   Other Supine Knee/Hip Exercises  heel slide 2x10 reps RLE only     Other Supine Knee/Hip Exercises  SAQ RLE, 4# x20 reps       Cryotherapy   Number Minutes  Cryotherapy  7 Minutes    Cryotherapy Location  Lumbar Spine Lx spine, hip, knee (Rt)    Type of Cryotherapy  Ice pack      Manual Therapy   Manual Therapy  Soft tissue mobilization;Joint mobilization;Neural Stretch    Joint Mobilization  grade IV Rt hip distraction x4 bouts of 15 sec     Soft tissue mobilization  Rolling to Rt quadriceps to decrease muscle spasm    Neural Stretch  sciatic nerve flossing x8 reps with therapist passive assistance, RLE only              PT Education - 12/03/17 1145    Education Details  updated HEP; ice parameters     Person(s) Educated  Patient    Methods  Explanation;Handout;Verbal cues    Comprehension  Verbalized understanding;Returned demonstration       PT Short Term Goals - 11/30/17 1224      PT SHORT TERM GOAL #1   Title  Pt will demo consistency and independence with his HEP to decrease pain and improve RLE strength.    Time  2    Period  Weeks    Status  New    Target Date  12/14/17      PT SHORT TERM GOAL #2    Title  Pt will demo proper log roll during transitions in/out of supine which will decrease caregiver burden and improve his efficiency throughout the session.    Time  2    Period  Weeks    Status  New      PT SHORT TERM GOAL #3   Title  Pt will be able to complete sit to/from supine transfers independently throughout his session to decrease caregiver support at home.     Time  4    Period  Weeks    Status  New    Target Date  12/28/17        PT Long Term Goals - 11/30/17 1225      PT LONG TERM GOAL #1   Title  Pt will demo improved Rt quadriceps strength to atleast 4/5 MMT which will assist with ambulating and getting in out of bed independently.     Time  8    Period  Weeks    Status  New    Target Date  01/25/18      PT LONG TERM GOAL #2   Title  Pt will be able to ascend and descend 4-5 steps without an AD and with reciprocal stepping pattern, 2/3 trials.    Baseline  --    Time  8    Period  Weeks    Status  New      PT LONG TERM GOAL #3   Title  Pt will be able to walk community distances without an AD to improve his independence and well being.     Time  8    Period  Weeks    Status  New      PT LONG TERM GOAL #4   Title  Pt will complete 5x sit to stand in less than 14 sec without UE support, which will reflect an increase in functional strength and power.     Baseline  --    Time  8    Period  Weeks    Status  New      PT LONG TERM GOAL #5   Title  Pt will report atleast 40% reduction in Rt hip/knee/low  back pain from the start of therapy which will improve his quality of life.     Time  8    Period  Weeks    Status  New            Plan - 12/03/17 1112    Clinical Impression Statement  Pt arrived today having completed his HEP as instructed. Noted improvements in RLE strength evident by his ability to complete LAQ and heel slide without assistance compared to his evaluation. Pt responded well to encouragement from the therapist, and his HEP was  updated to further encourage gentle abdominal isometric strength and RLE strength. Pt continues to rely heavily in rectus abdominus to crunch out of supine position when coming to sit and therapist will further address this in upcoming sessions.     Rehab Potential  Good    PT Frequency  2x / week    PT Duration  8 weeks    PT Treatment/Interventions  ADLs/Self Care Home Management;Cryotherapy;Electrical Stimulation;Moist Heat;Therapeutic exercise;Therapeutic activities;Functional mobility training;Stair training;Gait training;Ultrasound;Patient/family education;Manual techniques;Taping;Dry needling;Passive range of motion;Neuromuscular re-education    PT Next Visit Plan  review log roll and practice ; neural glides, progress quadriceps strength and hip strength, abdominal bracing     PT Home Exercise Plan  [place] and Agree with Plan of Care  Patient;Family member/caregiver    Family Member Consulted  spouse        Patient will benefit from skilled therapeutic intervention in order to improve the following deficits and impairments:  Abnormal gait, Hypomobility, Decreased strength, Pain, Increased muscle spasms, Difficulty walking, Decreased mobility, Decreased range of motion, Improper body mechanics, Postural dysfunction, Impaired flexibility, Decreased activity tolerance, Decreased balance, Decreased scar mobility  Visit Diagnosis: Acute midline low back pain, with sciatica presence unspecified  Pain in right hip  Other abnormalities of gait and mobility     Problem List Patient Active Problem List   Diagnosis Date Noted  . Lumbar stenosis with neurogenic claudication 11/11/2017  . PROSTATE SPECIFIC ANTIGEN, ELEVATED 07/19/2009  . BACK PAIN 06/11/2007  . Essential hypertension 05/21/2007  . Osteoarthritis 05/21/2007    12:36 PM,12/03/17 Sherol Dade PT, DPT [place] [facility] [place]  [facility] [address] [place], [place], [address] Phone: [phone]   Fax:  [phone]  Name: JARYAN CHICOINE MRN: [id number] Date of Birth: 1946-02-04

## 2017-12-07 ENCOUNTER — Ambulatory Visit: Payer: PPO | Admitting: Physical Therapy

## 2017-12-07 DIAGNOSIS — R2689 Other abnormalities of gait and mobility: Secondary | ICD-10-CM

## 2017-12-07 DIAGNOSIS — M545 Low back pain: Secondary | ICD-10-CM | POA: Diagnosis not present

## 2017-12-07 DIAGNOSIS — M25551 Pain in right hip: Secondary | ICD-10-CM

## 2017-12-07 DIAGNOSIS — M62830 Muscle spasm of back: Secondary | ICD-10-CM

## 2017-12-07 NOTE — Therapy (Signed)
[facility] [address] [place], [place], [address] Phone: [phone]   Fax:  [phone]  Physical Therapy Treatment  Patient Details  Name: Bryan [name] MRN: [id number] Date of Birth: May 30, 1946 Referring Provider: Jovita Gamma, MD    Encounter Date: 12/07/2017  PT End of Session - 12/07/17 1448    Visit Number  3    Number of Visits  16    Date for PT Re-Evaluation  01/25/18    Authorization Type  [employer]    Authorization Time Period  11/30/17 to 01/25/18    PT Start Time  1400    PT Stop Time  1450    PT Time Calculation (min)  50 min    Equipment Utilized During Treatment  Back brace    Activity Tolerance  No increased pain;Patient tolerated treatment well    Behavior During Therapy  [facility] for tasks assessed/performed       Past Medical History:  Diagnosis Date  . Anxiety   . Cancer ([place])    facial skin cancer  . Depression   . GERD (gastroesophageal reflux disease)    takes tums rarely  . Hypertension   . Osteoarthritis   . Spinal stenosis     Past Surgical History:  Procedure Laterality Date  . ACHILLES TENDON SURGERY  02/2006  . ANTERIOR LAT LUMBAR FUSION N/A 11/11/2017   Procedure: LUMBAR TWO- LUMBAR THREE, LUMBAR THREE- LUMBAR FOUR, LUMBAR FOUR- LUMBAR FIVE ANTERIOLATERAL INTERBODY ARTHRODESIS;  Surgeon: Jovita Gamma, MD;  Location: [place];  Service: Neurosurgery;  Laterality: N/A;  . COLONOSCOPY  2003  . COLONOSCOPY    . KNEE SURGERY     X2  . LUMBAR PERCUTANEOUS PEDICLE SCREW 3 LEVEL N/A 11/11/2017   Procedure: LUMBAR PERCUTANEOUS PEDICLE SCREW PLACEMENT LUMBAR TWO-THREE, LUMBER THREE-FOUR, LUMBAR FOUR-FIVE;  Surgeon: Jovita Gamma, MD;  Location: [place];  Service: Neurosurgery;  Laterality: N/A;  . SPINE SURGERY  12-08, 5-10  . TOTAL HIP ARTHROPLASTY      There were no vitals filed for this visit.  Subjective Assessment - 12/07/17 1405    Subjective  Pt reports  that he has been working on his exercises, but they are still pretty tough. He has not been icing it yet and isn't sure why he keeps forgetting it.     Currently in Pain?  Yes    Pain Score  5     Pain Location  Hip    Pain Orientation  Right    Pain Descriptors / Indicators  Aching    Pain Type  Surgical pain    Pain Radiating Towards  Rt hip/lateral thigh     Pain Onset  More than a month ago    Pain Frequency  Constant    Aggravating Factors   bringing the knee in towards the chest     Pain Relieving Factors  ice, medication              [facility] Adult PT Treatment/Exercise - 12/07/17 0001      Exercises   Exercises  Other Exercises    Other Exercises   supine BUE press into table x20 reps; seated BUE press into foam roll x20 reps       Lumbar Exercises: Supine   Clam  10 reps    Clam Limitations  x1 set each with blue band       Knee/Hip Exercises: Seated   Long Arc Quad  Both;2 sets;5 reps;Limitations  Long [employer] Limitations  therapist assistance with RLE      Knee/Hip Exercises: Supine   Other Supine Knee/Hip Exercises  hooklying adductor ball squeeze x15 reps     Other Supine Knee/Hip Exercises  Rt hip flexion AAROM to 90 deg flexion x10 reps       Manual Therapy   Joint Mobilization  grade IV Rt hip distraction x4 bouts of 15 sec     Soft tissue mobilization  STM Rt vastus lateralis and glute med              PT Education - 12/07/17 1447    Education Details  adjustments to HEP    Person(s) Educated  Patient;Spouse    Methods  Explanation;Verbal cues    Comprehension  Verbalized understanding;Returned demonstration       PT Short Term Goals - 12/07/17 1454      PT SHORT TERM GOAL #1   Title  Pt will demo consistency and independence with his HEP to decrease pain and improve RLE strength.    Time  2    Period  Weeks    Status  Achieved      PT SHORT TERM GOAL #2   Title  Pt will demo proper log roll during transitions in/out of supine which  will decrease caregiver burden and improve his efficiency throughout the session.    Time  2    Period  Weeks    Status  On-going      PT SHORT TERM GOAL #3   Title  Pt will be able to complete sit to/from supine transfers independently throughout his session to decrease caregiver support at home.     Time  4    Period  Weeks    Status  On-going        PT Long Term Goals - 11/30/17 1225      PT LONG TERM GOAL #1   Title  Pt will demo improved Rt quadriceps strength to atleast 4/5 MMT which will assist with ambulating and getting in out of bed independently.     Time  8    Period  Weeks    Status  New    Target Date  01/25/18      PT LONG TERM GOAL #2   Title  Pt will be able to ascend and descend 4-5 steps without an AD and with reciprocal stepping pattern, 2/3 trials.    Baseline  --    Time  8    Period  Weeks    Status  New      PT LONG TERM GOAL #3   Title  Pt will be able to walk community distances without an AD to improve his independence and well being.     Time  8    Period  Weeks    Status  New      PT LONG TERM GOAL #4   Title  Pt will complete 5x sit to stand in less than 14 sec without UE support, which will reflect an increase in functional strength and power.     Baseline  --    Time  8    Period  Weeks    Status  New      PT LONG TERM GOAL #5   Title  Pt will report atleast 40% reduction in Rt hip/knee/low back pain from the start of therapy which will improve his quality of life.     Time  8  Period  Weeks    Status  New            Plan - 12/07/17 1448    Clinical Impression Statement  Pt is making steady progress towards his goals, demonstrating improved RLE strength with increased LAQ this session. His pain report has steadily decreased with each visit and he continues to complete his HEP as instructed. Therapist encouraged pt to use ice modality at home for pain relief due to the fact this typically helps during his sessions and they will  try to do more of this moving forward. Ended the session with soft tissue mobilization to the Rt quadriceps and glute med to decrease palpable trigger points and improve tissue flexibility. Pt demonstrated good understanding of exercises and reported no increase in his pain end of session.     Rehab Potential  Good    PT Frequency  2x / week    PT Duration  8 weeks    PT Treatment/Interventions  ADLs/Self Care Home Management;Cryotherapy;Electrical Stimulation;Moist Heat;Therapeutic exercise;Therapeutic activities;Functional mobility training;Stair training;Gait training;Ultrasound;Patient/family education;Manual techniques;Taping;Dry needling;Passive range of motion;Neuromuscular re-education    PT Next Visit Plan  review log roll and practice; neural glides, progress quadriceps strength (increase LAQ, possible weight) and hip strength, abdominal bracing (supine press, seated press)    PT Home Exercise Plan  [email or web address]    Consulted and Agree with Plan of Care  Patient;Family member/caregiver    Family Member Consulted  spouse        Patient will benefit from skilled therapeutic intervention in order to improve the following deficits and impairments:  Abnormal gait, Hypomobility, Decreased strength, Pain, Increased muscle spasms, Difficulty walking, Decreased mobility, Decreased range of motion, Improper body mechanics, Postural dysfunction, Impaired flexibility, Decreased activity tolerance, Decreased balance, Decreased scar mobility  Visit Diagnosis: Acute midline low back pain, with sciatica presence unspecified  Pain in right hip  Other abnormalities of gait and mobility  Muscle spasm of back     Problem List Patient Active Problem List   Diagnosis Date Noted  . Lumbar stenosis with neurogenic claudication 11/11/2017  . PROSTATE SPECIFIC ANTIGEN, ELEVATED 07/19/2009  . BACK PAIN 06/11/2007  . Essential hypertension 05/21/2007  . Osteoarthritis 05/21/2007   2:57 PM,12/07/17 Sherol Dade PT, DPT [place] [facility] [place] [facility] [address], [place], [place], [address] Phone: [phone]   Fax:  [phone]  Name: Bryan [name] MRN: [id number] Date of Birth: [birth year]

## 2017-12-10 ENCOUNTER — Ambulatory Visit: Payer: PPO | Admitting: Physical Therapy

## 2017-12-10 DIAGNOSIS — M25551 Pain in right hip: Secondary | ICD-10-CM

## 2017-12-10 DIAGNOSIS — M545 Low back pain: Secondary | ICD-10-CM | POA: Diagnosis not present

## 2017-12-10 DIAGNOSIS — M62830 Muscle spasm of back: Secondary | ICD-10-CM

## 2017-12-10 DIAGNOSIS — M48062 Spinal stenosis, lumbar region with neurogenic claudication: Secondary | ICD-10-CM | POA: Diagnosis not present

## 2017-12-10 DIAGNOSIS — R2689 Other abnormalities of gait and mobility: Secondary | ICD-10-CM

## 2017-12-10 NOTE — Therapy (Signed)
[facility] [address] [place], [place], [address] Phone: [phone]   Fax:  [phone]  Physical Therapy Treatment  Patient Details  Name: Bryan [name] MRN: [id number] Date of Birth: [birth year] Referring Provider: Jovita Gamma, MD    Encounter Date: 12/10/2017  PT End of Session - 12/10/17 1056    Visit Number  4    Number of Visits  16    Date for PT Re-Evaluation  01/25/18    Authorization Type  [employer]    Authorization Time Period  11/30/17 to 01/25/18    PT Start Time  0930    PT Stop Time  1018    PT Time Calculation (min)  48 min    Equipment Utilized During Treatment  Back brace    Activity Tolerance  No increased pain;Patient tolerated treatment well    Behavior During Therapy  [facility] for tasks assessed/performed       Past Medical History:  Diagnosis Date  . Anxiety   . Cancer ([place])    facial skin cancer  . Depression   . GERD (gastroesophageal reflux disease)    takes tums rarely  . Hypertension   . Osteoarthritis   . Spinal stenosis     Past Surgical History:  Procedure Laterality Date  . ACHILLES TENDON SURGERY  02/2006  . ANTERIOR LAT LUMBAR FUSION N/A 11/11/2017   Procedure: LUMBAR TWO- LUMBAR THREE, LUMBAR THREE- LUMBAR FOUR, LUMBAR FOUR- LUMBAR FIVE ANTERIOLATERAL INTERBODY ARTHRODESIS;  Surgeon: Jovita Gamma, MD;  Location: [place];  Service: Neurosurgery;  Laterality: N/A;  . COLONOSCOPY  2003  . COLONOSCOPY    . KNEE SURGERY     X2  . LUMBAR PERCUTANEOUS PEDICLE SCREW 3 LEVEL N/A 11/11/2017   Procedure: LUMBAR PERCUTANEOUS PEDICLE SCREW PLACEMENT LUMBAR TWO-THREE, LUMBER THREE-FOUR, LUMBAR FOUR-FIVE;  Surgeon: Jovita Gamma, MD;  Location: [place];  Service: Neurosurgery;  Laterality: N/A;  . SPINE SURGERY  12-08, 5-10  . TOTAL HIP ARTHROPLASTY      There were no vitals filed for this visit.  Subjective Assessment - 12/10/17 0934    Subjective  Pt reports  that things are slowly improving. He has been working on his HEP. Pain currently in the Rt thigh and knee.     Currently in Pain?  Yes    Pain Score  7     Pain Location  Hip    Pain Orientation  Right    Pain Descriptors / Indicators  Aching    Pain Type  Surgical pain    Pain Radiating Towards  Rt lateral thigh and knee     Pain Onset  More than a month ago    Pain Frequency  Constant    Aggravating Factors   bringing the knee towards the chest     Pain Relieving Factors  ice, medication                       [facility] Adult PT Treatment/Exercise - 12/10/17 0001      Exercises   Exercises  Lumbar    Other Exercises   --      Lumbar Exercises: Stretches   Hip Flexor Stretch  2 reps;Left;Right;Limitations    Hip Flexor Stretch Limitations  supine laying flat with glute set       Lumbar Exercises: Aerobic   Nustep  L2 x4 min, PT present to encourage Rt hip ROM      Lumbar Exercises:  Seated   Other Seated Lumbar Exercises  BUE press into foam roll with adductor squeeze 15x3 sec hold       Lumbar Exercises: Supine   Other Supine Lumbar Exercises  supine BUE press into table for 3 sec x20 reps; supine chops each direction with adductor squeeze and green TB x15 reps , supine lifts each direction with adductor squeeze and green TB x15 reps        Lumbar Exercises: Sidelying   Other Sidelying Lumbar Exercises  Rt hip flexion slide 2x10 reps to 90 deg flexion      Cryotherapy   Number Minutes Cryotherapy  8 Minutes    Cryotherapy Location  Lumbar Spine Rt hip, Rt knee    Type of Cryotherapy  Ice pack      Manual Therapy   Joint Mobilization  Grade IV lateral hip mobilization x4 bouts; Grade III-IV Rt hip long axis distraction x3 bouts              PT Education - 12/10/17 1056    Education Details  technique with therex    Person(s) Educated  Patient    Methods  Explanation;Tactile cues    Comprehension  Verbalized understanding;Returned demonstration        PT Short Term Goals - 12/07/17 1454      PT SHORT TERM GOAL #1   Title  Pt will demo consistency and independence with his HEP to decrease pain and improve RLE strength.    Time  2    Period  Weeks    Status  Achieved      PT SHORT TERM GOAL #2   Title  Pt will demo proper log roll during transitions in/out of supine which will decrease caregiver burden and improve his efficiency throughout the session.    Time  2    Period  Weeks    Status  On-going      PT SHORT TERM GOAL #3   Title  Pt will be able to complete sit to/from supine transfers independently throughout his session to decrease caregiver support at home.     Time  4    Period  Weeks    Status  On-going        PT Long Term Goals - 11/30/17 1225      PT LONG TERM GOAL #1   Title  Pt will demo improved Rt quadriceps strength to atleast 4/5 MMT which will assist with ambulating and getting in out of bed independently.     Time  8    Period  Weeks    Status  New    Target Date  01/25/18      PT LONG TERM GOAL #2   Title  Pt will be able to ascend and descend 4-5 steps without an AD and with reciprocal stepping pattern, 2/3 trials.    Baseline  --    Time  8    Period  Weeks    Status  New      PT LONG TERM GOAL #3   Title  Pt will be able to walk community distances without an AD to improve his independence and well being.     Time  8    Period  Weeks    Status  New      PT LONG TERM GOAL #4   Title  Pt will complete 5x sit to stand in less than 14 sec without UE support, which will reflect an increase in  functional strength and power.     Baseline  --    Time  8    Period  Weeks    Status  New      PT LONG TERM GOAL #5   Title  Pt will report atleast 40% reduction in Rt hip/knee/low back pain from the start of therapy which will improve his quality of life.     Time  8    Period  Weeks    Status  New            Plan - 12/10/17 1057    Clinical Impression Statement  Today's session  focused on therex to improve hip flexion strength in gravity eliminated positions with less pain overall. Also completed trunk stability work in supine with diagonal patterns. Ended with joint mobilizations to the Rt hip, with pt reporting resolved pain end of session. Therapist continues to encourage pt during sessions and progress HEP as able.     Rehab Potential  Good    PT Frequency  2x / week    PT Duration  8 weeks    PT Treatment/Interventions  ADLs/Self Care Home Management;Cryotherapy;Electrical Stimulation;Moist Heat;Therapeutic exercise;Therapeutic activities;Functional mobility training;Stair training;Gait training;Ultrasound;Patient/family education;Manual techniques;Taping;Dry needling;Passive range of motion;Neuromuscular re-education    PT Next Visit Plan  review log roll and practice; neural glides, progress quadriceps strength (increase LAQ, possible weight) and hip strength, abdominal bracing (supine press, seated press)    PT Home Exercise Plan  [email or web address]    Consulted and Agree with Plan of Care  Patient;Family member/caregiver    Family Member Consulted  spouse        Patient will benefit from skilled therapeutic intervention in order to improve the following deficits and impairments:  Abnormal gait, Hypomobility, Decreased strength, Pain, Increased muscle spasms, Difficulty walking, Decreased mobility, Decreased range of motion, Improper body mechanics, Postural dysfunction, Impaired flexibility, Decreased activity tolerance, Decreased balance, Decreased scar mobility  Visit Diagnosis: Acute midline low back pain, with sciatica presence unspecified  Pain in right hip  Other abnormalities of gait and mobility  Muscle spasm of back     Problem List Patient Active Problem List   Diagnosis Date Noted  . Lumbar stenosis with neurogenic claudication 11/11/2017  . PROSTATE SPECIFIC ANTIGEN, ELEVATED 07/19/2009  . BACK PAIN 06/11/2007  . Essential hypertension 05/21/2007   . Osteoarthritis 05/21/2007    11:01 AM,12/10/17 Sherol Dade PT, DPT [place] [facility] [place] [facility] [address] [place], [place], [address] Phone: [phone]   Fax:  [phone]  Name: Bryan [name] MRN: [id number] Date of Birth: 03-12-1946

## 2017-12-14 ENCOUNTER — Ambulatory Visit: Payer: PPO | Attending: Neurosurgery | Admitting: Physical Therapy

## 2017-12-14 ENCOUNTER — Encounter: Payer: Self-pay | Admitting: Physical Therapy

## 2017-12-14 DIAGNOSIS — R2689 Other abnormalities of gait and mobility: Secondary | ICD-10-CM | POA: Diagnosis not present

## 2017-12-14 DIAGNOSIS — M545 Low back pain: Secondary | ICD-10-CM | POA: Diagnosis not present

## 2017-12-14 DIAGNOSIS — M62830 Muscle spasm of back: Secondary | ICD-10-CM

## 2017-12-14 DIAGNOSIS — M25551 Pain in right hip: Secondary | ICD-10-CM | POA: Diagnosis not present

## 2017-12-14 NOTE — Therapy (Signed)
[facility] [address], [place], [place], [address] Phone: [phone]   Fax:  [phone]  Physical Therapy Treatment  Patient Details  Name: Bryan [name] MRN: [id number] Date of Birth: 1945-08-15 Referring Provider: Jovita Gamma, MD    Encounter Date: 12/14/2017  PT End of Session - 12/14/17 1114    Visit Number  5    Number of Visits  16    Date for PT Re-Evaluation  01/25/18    Authorization Type  [employer]    Authorization Time Period  11/30/17 to 01/25/18    PT Start Time  1102    PT Stop Time  1144    PT Time Calculation (min)  42 min    Equipment Utilized During Treatment  Back brace    Activity Tolerance  No increased pain;Patient tolerated treatment well    Behavior During Therapy  [facility] for tasks assessed/performed       Past Medical History:  Diagnosis Date  . Anxiety   . Cancer ([place])    facial skin cancer  . Depression   . GERD (gastroesophageal reflux disease)    takes tums rarely  . Hypertension   . Osteoarthritis   . Spinal stenosis     Past Surgical History:  Procedure Laterality Date  . ACHILLES TENDON SURGERY  02/2006  . ANTERIOR LAT LUMBAR FUSION N/A 11/11/2017   Procedure: LUMBAR TWO- LUMBAR THREE, LUMBAR THREE- LUMBAR FOUR, LUMBAR FOUR- LUMBAR FIVE ANTERIOLATERAL INTERBODY ARTHRODESIS;  Surgeon: Jovita Gamma, MD;  Location: [place];  Service: Neurosurgery;  Laterality: N/A;  . COLONOSCOPY  2003  . COLONOSCOPY    . KNEE SURGERY     X2  . LUMBAR PERCUTANEOUS PEDICLE SCREW 3 LEVEL N/A 11/11/2017   Procedure: LUMBAR PERCUTANEOUS PEDICLE SCREW PLACEMENT LUMBAR TWO-THREE, LUMBER THREE-FOUR, LUMBAR FOUR-FIVE;  Surgeon: Jovita Gamma, MD;  Location: [place];  Service: Neurosurgery;  Laterality: N/A;  . SPINE SURGERY  12-08, 5-10  . TOTAL HIP ARTHROPLASTY      There were no vitals filed for this visit.  Subjective Assessment - 12/14/17 1105    Subjective  Pt reports  that things are going well. He was told he can get in the water to exercise. He took pain meds before coming, so the pain isn't too bad.     Currently in Pain?  Yes    Pain Score  4     Pain Location  Hip    Pain Orientation  Right    Pain Descriptors / Indicators  Aching    Pain Type  Surgical pain    Pain Radiating Towards  Rt lateral thigh and knee     Pain Onset  More than a month ago    Pain Frequency  Constant    Aggravating Factors   bringing the knee towards the chest     Pain Relieving Factors  ice, medication                        [facility] Adult PT Treatment/Exercise - 12/14/17 0001      Exercises   Exercises  Knee/Hip      Lumbar Exercises: Supine   Other Supine Lumbar Exercises  abdominal brace with UE flexion holding green TB       Lumbar Exercises: Sidelying   Clam  Both;Limitations    Clam Limitations  green TB      Knee/Hip Exercises: Standing   Other Standing Knee Exercises  Rt hip disctraction off 6" step, with hip flexion/extension and abduction      Knee/Hip Exercises: Seated   Long Arc Quad  Right;1 set;10 reps    Long Arc Quad Weight  2 lbs.    Long [employer] Limitations  therapist cuing to increase knee extension      Knee/Hip Exercises: Supine   Other Supine Knee/Hip Exercises  adductor ball squeeze for 5 sec, x15 reps       Manual Therapy   Manual Therapy  Soft tissue mobilization    Joint Mobilization  Grade IV lateral hip mobilization x4 bouts; Grade III-IV Rt hip long axis distraction x3 bouts     Soft tissue mobilization  scar mobilization to lumbar incisions              PT Education - 12/14/17 1114    Education Details  implications for scar massage; importance of completing all exercises on HEP and increasing to 2x/day    Person(s) Educated  Patient    Methods  Explanation;Handout;Verbal cues    Comprehension  Verbalized understanding;Returned demonstration       PT Short Term Goals - 12/07/17 1454      PT SHORT  TERM GOAL #1   Title  Pt will demo consistency and independence with his HEP to decrease pain and improve RLE strength.    Time  2    Period  Weeks    Status  Achieved      PT SHORT TERM GOAL #2   Title  Pt will demo proper log roll during transitions in/out of supine which will decrease caregiver burden and improve his efficiency throughout the session.    Time  2    Period  Weeks    Status  On-going      PT SHORT TERM GOAL #3   Title  Pt will be able to complete sit to/from supine transfers independently throughout his session to decrease caregiver support at home.     Time  4    Period  Weeks    Status  On-going        PT Long Term Goals - 11/30/17 1225      PT LONG TERM GOAL #1   Title  Pt will demo improved Rt quadriceps strength to atleast 4/5 MMT which will assist with ambulating and getting in out of bed independently.     Time  8    Period  Weeks    Status  New    Target Date  01/25/18      PT LONG TERM GOAL #2   Title  Pt will be able to ascend and descend 4-5 steps without an AD and with reciprocal stepping pattern, 2/3 trials.    Baseline  --    Time  8    Period  Weeks    Status  New      PT LONG TERM GOAL #3   Title  Pt will be able to walk community distances without an AD to improve his independence and well being.     Time  8    Period  Weeks    Status  New      PT LONG TERM GOAL #4   Title  Pt will complete 5x sit to stand in less than 14 sec without UE support, which will reflect an increase in functional strength and power.     Baseline  --    Time  8    Period  Weeks    Status  New      PT LONG TERM GOAL #5   Title  Pt will report atleast 40% reduction in Rt hip/knee/low back pain from the start of therapy which will improve his quality of life.     Time  8    Period  Weeks    Status  New            Plan - 12/14/17 1146    Clinical Impression Statement  Pt continues to make progress towards his goals. He demonstrates improvements in  quadriceps strength, evident by his ability to complete LAQ without needing UE assistance. He is able to complete bed mobility with supervision mostly, although he does require verbal cuing from therapist to decrease his habit of assisting his RLE onto the bed. Session continued with therex to promote RLE strength and abdominal strength. Noted scar adhesions along the lumbar incision, and completed scar mobilization to decrease this. Ended session discussing updates to pt's HEP and he verbalized agreement and understanding of this.     Rehab Potential  Good    PT Frequency  2x / week    PT Duration  8 weeks    PT Treatment/Interventions  ADLs/Self Care Home Management;Cryotherapy;Electrical Stimulation;Moist Heat;Therapeutic exercise;Therapeutic activities;Functional mobility training;Stair training;Gait training;Ultrasound;Patient/family education;Manual techniques;Taping;Dry needling;Passive range of motion;Neuromuscular re-education    PT Next Visit Plan  f/u on HEP 2x/day and water exercise; progress quadriceps strength (increase LAQ, possible weight) and hip strength, abdominal bracing (supine press, seated press)    PT Home Exercise Plan  [email or web address]    Consulted and Agree with Plan of Care  Patient;Family member/caregiver    Family Member Consulted  spouse        Patient will benefit from skilled therapeutic intervention in order to improve the following deficits and impairments:  Abnormal gait, Hypomobility, Decreased strength, Pain, Increased muscle spasms, Difficulty walking, Decreased mobility, Decreased range of motion, Improper body mechanics, Postural dysfunction, Impaired flexibility, Decreased activity tolerance, Decreased balance, Decreased scar mobility  Visit Diagnosis: Acute midline low back pain, with sciatica presence unspecified  Pain in right hip  Other abnormalities of gait and mobility  Muscle spasm of back     Problem List Patient Active Problem List   Diagnosis  Date Noted  . Lumbar stenosis with neurogenic claudication 11/11/2017  . PROSTATE SPECIFIC ANTIGEN, ELEVATED 07/19/2009  . BACK PAIN 06/11/2007  . Essential hypertension 05/21/2007  . Osteoarthritis 05/21/2007    12:05 PM,12/14/17 Sherol Dade PT, DPT [place] [facility] [place]  [facility] [address] [place], [place], [address] Phone: [phone]   Fax:  [phone]  Name: Bryan [name] MRN: [id number] Date of Birth: 02-03-1946

## 2017-12-14 NOTE — Patient Instructions (Signed)
Access Code: [email or web address]  URL: [email or web address]  Date: 12/14/2017  Prepared by: Elly Modena   Exercises  Supine Heel Slides - 15 reps - 3x daily - 7x weekly  Supine March - 5 reps - 3x daily - 7x weekly  Clamshell with Resistance - 10 reps - 3 sets - 1x daily - 7x weekly  Seated Long Arc Quad - 5 reps - 2 sets - 1x daily - 7x weekly    [facility] [address], [place], [place] [address] Phone # [phone] Fax [phone]

## 2017-12-18 ENCOUNTER — Encounter: Payer: Self-pay | Admitting: Physical Therapy

## 2017-12-18 ENCOUNTER — Ambulatory Visit: Payer: PPO | Admitting: Physical Therapy

## 2017-12-18 DIAGNOSIS — M545 Low back pain: Secondary | ICD-10-CM | POA: Diagnosis not present

## 2017-12-18 DIAGNOSIS — M25551 Pain in right hip: Secondary | ICD-10-CM

## 2017-12-18 DIAGNOSIS — R2689 Other abnormalities of gait and mobility: Secondary | ICD-10-CM

## 2017-12-18 NOTE — Therapy (Signed)
[facility] [address] [place], [place], [address] Phone: [phone]   Fax:  [phone]  Physical Therapy Treatment  Patient Details  Name: Bryan [name] MRN: [id number] Date of Birth: [birth year] Referring Provider: Jovita Gamma, MD    Encounter Date: 12/18/2017  PT End of Session - 12/18/17 1058    Visit Number  6    Number of Visits  16    Date for PT Re-Evaluation  01/25/18    Authorization Type  [employer]    Authorization Time Period  11/30/17 to 01/25/18    PT Start Time  1058    PT Stop Time  1140    PT Time Calculation (min)  42 min    Activity Tolerance  No increased pain;Patient tolerated treatment well    Behavior During Therapy  [facility] for tasks assessed/performed       Past Medical History:  Diagnosis Date  . Anxiety   . Cancer ([place])    facial skin cancer  . Depression   . GERD (gastroesophageal reflux disease)    takes tums rarely  . Hypertension   . Osteoarthritis   . Spinal stenosis     Past Surgical History:  Procedure Laterality Date  . ACHILLES TENDON SURGERY  02/2006  . ANTERIOR LAT LUMBAR FUSION N/A 11/11/2017   Procedure: LUMBAR TWO- LUMBAR THREE, LUMBAR THREE- LUMBAR FOUR, LUMBAR FOUR- LUMBAR FIVE ANTERIOLATERAL INTERBODY ARTHRODESIS;  Surgeon: Jovita Gamma, MD;  Location: [place];  Service: Neurosurgery;  Laterality: N/A;  . COLONOSCOPY  2003  . COLONOSCOPY    . KNEE SURGERY     X2  . LUMBAR PERCUTANEOUS PEDICLE SCREW 3 LEVEL N/A 11/11/2017   Procedure: LUMBAR PERCUTANEOUS PEDICLE SCREW PLACEMENT LUMBAR TWO-THREE, LUMBER THREE-FOUR, LUMBAR FOUR-FIVE;  Surgeon: Jovita Gamma, MD;  Location: [place];  Service: Neurosurgery;  Laterality: N/A;  . SPINE SURGERY  12-08, 5-10  . TOTAL HIP ARTHROPLASTY      There were no vitals filed for this visit.  Subjective Assessment - 12/18/17 1115    Subjective  Things are getting better.  Pt states he did exercises in the  water, very gentle.     Currently in Pain?  Yes    Pain Score  4     Pain Location  Hip    Pain Orientation  Right    Pain Radiating Towards  into right thigh    Pain Onset  More than a month ago    Multiple Pain Sites  No                       [facility] Adult PT Treatment/Exercise - 12/18/17 0001      Lumbar Exercises: Aerobic   Nustep  L1 x5 minwith UE and try to facilitate      Lumbar Exercises: Supine   Clam  20 reps single leg red band    Heel Slides  15 reps ab brace with small ROM heel slide    Other Supine Lumbar Exercises  right LE glute set in supine with foam roll under knees - abdominal bracing - 10x 5 sec hold      Knee/Hip Exercises: Standing   Other Standing Knee Exercises  weight shifting with cues to activate glutes      Knee/Hip Exercises: Seated   Long Arc Quad  Right;1 set;10 reps    Ball Squeeze  squeeze 5 sec x 10      Knee/Hip Exercises: Supine  Quad Sets  Strengthening;Right;Limitations;20 reps    Quad Sets Limitations  4#       right LE hip distraction - 3 x 30 sec        PT Short Term Goals - 12/07/17 1454      PT SHORT TERM GOAL #1   Title  Pt will demo consistency and independence with his HEP to decrease pain and improve RLE strength.    Time  2    Period  Weeks    Status  Achieved      PT SHORT TERM GOAL #2   Title  Pt will demo proper log roll during transitions in/out of supine which will decrease caregiver burden and improve his efficiency throughout the session.    Time  2    Period  Weeks    Status  On-going      PT SHORT TERM GOAL #3   Title  Pt will be able to complete sit to/from supine transfers independently throughout his session to decrease caregiver support at home.     Time  4    Period  Weeks    Status  On-going        PT Long Term Goals - 11/30/17 1225      PT LONG TERM GOAL #1   Title  Pt will demo improved Rt quadriceps strength to atleast 4/5 MMT which will assist with ambulating and  getting in out of bed independently.     Time  8    Period  Weeks    Status  New    Target Date  01/25/18      PT LONG TERM GOAL #2   Title  Pt will be able to ascend and descend 4-5 steps without an AD and with reciprocal stepping pattern, 2/3 trials.    Baseline  --    Time  8    Period  Weeks    Status  New      PT LONG TERM GOAL #3   Title  Pt will be able to walk community distances without an AD to improve his independence and well being.     Time  8    Period  Weeks    Status  New      PT LONG TERM GOAL #4   Title  Pt will complete 5x sit to stand in less than 14 sec without UE support, which will reflect an increase in functional strength and power.     Baseline  --    Time  8    Period  Weeks    Status  New      PT LONG TERM GOAL #5   Title  Pt will report atleast 40% reduction in Rt hip/knee/low back pain from the start of therapy which will improve his quality of life.     Time  8    Period  Weeks    Status  New            Plan - 12/18/17 1201    Clinical Impression Statement  Pt is able to perform exercises today with cues to pause between reps for greatest muscle recruitment.  Pt continues to have weakness and decreased endurance.  He is improving and was able to work out in the pool this last week.  Observed possible leg length difference of Rt LE being shorter, visually noted in hook lying and standing.  Pt needs cues to activate glutes in stand.  Pt will benefit  from skilled PT to continue to strengthen core and LE.    PT Treatment/Interventions  ADLs/Self Care Home Management;Cryotherapy;Electrical Stimulation;Moist Heat;Therapeutic exercise;Therapeutic activities;Functional mobility training;Stair training;Gait training;Ultrasound;Patient/family education;Manual techniques;Taping;Dry needling;Passive range of motion;Neuromuscular re-education    PT Next Visit Plan  f/u on HEP and measure LLD on right possible need for heel lift?; progress quadriceps  strength (increase LAQ, possible weight) and hip strength, abdominal bracing (supine press, seated press), weight bearing as tolerated    PT Home Exercise Plan  [email or web address]    Consulted and Agree with Plan of Care  Patient       Patient will benefit from skilled therapeutic intervention in order to improve the following deficits and impairments:  Abnormal gait, Hypomobility, Decreased strength, Pain, Increased muscle spasms, Difficulty walking, Decreased mobility, Decreased range of motion, Improper body mechanics, Postural dysfunction, Impaired flexibility, Decreased activity tolerance, Decreased balance, Decreased scar mobility  Visit Diagnosis: Acute midline low back pain, with sciatica presence unspecified  Pain in right hip  Other abnormalities of gait and mobility     Problem List Patient Active Problem List   Diagnosis Date Noted  . Lumbar stenosis with neurogenic claudication 11/11/2017  . PROSTATE SPECIFIC ANTIGEN, ELEVATED 07/19/2009  . BACK PAIN 06/11/2007  . Essential hypertension 05/21/2007  . Osteoarthritis 05/21/2007    Zannie Cove, PT 12/18/2017, 12:08 PM   [facility] [address], [place], [place], [address] Phone: [phone]   Fax:  [phone]  Name: Bryan [name] MRN: [id number] Date of Birth: 1946/06/01

## 2017-12-20 ENCOUNTER — Other Ambulatory Visit: Payer: Self-pay | Admitting: Internal Medicine

## 2017-12-21 ENCOUNTER — Encounter: Payer: Self-pay | Admitting: Physical Therapy

## 2017-12-21 ENCOUNTER — Ambulatory Visit: Payer: PPO | Admitting: Physical Therapy

## 2017-12-21 DIAGNOSIS — M25551 Pain in right hip: Secondary | ICD-10-CM

## 2017-12-21 DIAGNOSIS — M62830 Muscle spasm of back: Secondary | ICD-10-CM

## 2017-12-21 DIAGNOSIS — M545 Low back pain: Secondary | ICD-10-CM | POA: Diagnosis not present

## 2017-12-21 DIAGNOSIS — R2689 Other abnormalities of gait and mobility: Secondary | ICD-10-CM

## 2017-12-21 NOTE — Therapy (Signed)
[facility] [address] [place], [place], [address] Phone: [phone]   Fax:  [phone]  Physical Therapy Treatment  Patient Details  Name: Bryan [name] MRN: [id number] Date of Birth: [birth year] Referring Provider: Jovita Gamma, MD    Encounter Date: 12/21/2017  PT End of Session - 12/21/17 1049    Visit Number  7    Number of Visits  16    Date for PT Re-Evaluation  01/25/18    Authorization Type  [employer]    Authorization Time Period  11/30/17 to 01/25/18    PT Start Time  1018    PT Stop Time  1106    PT Time Calculation (min)  48 min    Activity Tolerance  No increased pain;Patient tolerated treatment well    Behavior During Therapy  [facility] for tasks assessed/performed       Past Medical History:  Diagnosis Date  . Anxiety   . Cancer ([place])    facial skin cancer  . Depression   . GERD (gastroesophageal reflux disease)    takes tums rarely  . Hypertension   . Osteoarthritis   . Spinal stenosis     Past Surgical History:  Procedure Laterality Date  . ACHILLES TENDON SURGERY  02/2006  . ANTERIOR LAT LUMBAR FUSION N/A 11/11/2017   Procedure: LUMBAR TWO- LUMBAR THREE, LUMBAR THREE- LUMBAR FOUR, LUMBAR FOUR- LUMBAR FIVE ANTERIOLATERAL INTERBODY ARTHRODESIS;  Surgeon: Jovita Gamma, MD;  Location: [place];  Service: Neurosurgery;  Laterality: N/A;  . COLONOSCOPY  2003  . COLONOSCOPY    . KNEE SURGERY     X2  . LUMBAR PERCUTANEOUS PEDICLE SCREW 3 LEVEL N/A 11/11/2017   Procedure: LUMBAR PERCUTANEOUS PEDICLE SCREW PLACEMENT LUMBAR TWO-THREE, LUMBER THREE-FOUR, LUMBAR FOUR-FIVE;  Surgeon: Jovita Gamma, MD;  Location: [place];  Service: Neurosurgery;  Laterality: N/A;  . SPINE SURGERY  12-08, 5-10  . TOTAL HIP ARTHROPLASTY      There were no vitals filed for this visit.  Subjective Assessment - 12/21/17 1023    Subjective  Pt states he is working on his pool exercises. Pt reports that  his pain is about the same, not worse.     Currently in Pain?  Yes    Pain Score  6     Pain Location  Hip    Pain Orientation  Right;Lateral    Pain Descriptors / Indicators  Aching    Pain Type  Chronic pain    Pain Radiating Towards  into right thigh     Pain Onset  More than a month ago    Pain Frequency  Constant    Aggravating Factors   alot of activity, lifting the leg    Pain Relieving Factors  ice, medication                        [facility] Adult PT Treatment/Exercise - 12/21/17 0001      Knee/Hip Exercises: Standing   Other Standing Knee Exercises  Rt hip extension isometric hold against wall x20 reps; Lt and Rt hip abduction isometric hold against wall x10 reps with Rt, x5 reps with Lt       Knee/Hip Exercises: Seated   Long Arc Quad  Right;3 sets;10 reps    Long Arc Quad Weight  5 lbs.      Knee/Hip Exercises: Sidelying   Clams  Rt hip x10 reps       Cryotherapy  Number Minutes Cryotherapy  7 Minutes    Cryotherapy Location  Lumbar Spine Rt hip, Rt knee     Type of Cryotherapy  Ice pack      Manual Therapy   Joint Mobilization  Grade III inferior and lateral Rt hip mobilization x2 bouts    Soft tissue mobilization  rolling stick Rt vastus lateralist, ITB             PT Education - 12/21/17 1117    Education Details  importance of completing strengthening exercises in addition to water exercise to improve hip strength     Person(s) Educated  Patient    Methods  Explanation    Comprehension  Verbalized understanding       PT Short Term Goals - 12/07/17 1454      PT SHORT TERM GOAL #1   Title  Pt will demo consistency and independence with his HEP to decrease pain and improve RLE strength.    Time  2    Period  Weeks    Status  Achieved      PT SHORT TERM GOAL #2   Title  Pt will demo proper log roll during transitions in/out of supine which will decrease caregiver burden and improve his efficiency throughout the session.    Time  2     Period  Weeks    Status  On-going      PT SHORT TERM GOAL #3   Title  Pt will be able to complete sit to/from supine transfers independently throughout his session to decrease caregiver support at home.     Time  4    Period  Weeks    Status  On-going        PT Long Term Goals - 11/30/17 1225      PT LONG TERM GOAL #1   Title  Pt will demo improved Rt quadriceps strength to atleast 4/5 MMT which will assist with ambulating and getting in out of bed independently.     Time  8    Period  Weeks    Status  New    Target Date  01/25/18      PT LONG TERM GOAL #2   Title  Pt will be able to ascend and descend 4-5 steps without an AD and with reciprocal stepping pattern, 2/3 trials.    Baseline  --    Time  8    Period  Weeks    Status  New      PT LONG TERM GOAL #3   Title  Pt will be able to walk community distances without an AD to improve his independence and well being.     Time  8    Period  Weeks    Status  New      PT LONG TERM GOAL #4   Title  Pt will complete 5x sit to stand in less than 14 sec without UE support, which will reflect an increase in functional strength and power.     Baseline  --    Time  8    Period  Weeks    Status  New      PT LONG TERM GOAL #5   Title  Pt will report atleast 40% reduction in Rt hip/knee/low back pain from the start of therapy which will improve his quality of life.     Time  8    Period  Weeks    Status  New  Plan - 12/21/17 1118    Clinical Impression Statement  Pt is making progress towards goals, demonstrating improved RLE independence when completing sit to/from supine transitions this session. Completed manual treatment to the area to improve Rt hip mobility and decrease muscle spasm in the quadriceps specifically. Pt reports decrease in his Rt hip pain with this, although he had difficulty completing hip abduction isometric against the wall secondary to hip pain. Therapist encouraged continued  participation in pool exercise in addition to strengthening exercise for the Rt hip and he verbalized agreement with this.    PT Treatment/Interventions  ADLs/Self Care Home Management;Cryotherapy;Electrical Stimulation;Moist Heat;Therapeutic exercise;Therapeutic activities;Functional mobility training;Stair training;Gait training;Ultrasound;Patient/family education;Manual techniques;Taping;Dry needling;Passive range of motion;Neuromuscular re-education    PT Next Visit Plan  f/u on HEP and measure LLD on right possible need for heel lift?; progress quadriceps strength (increase LAQ, possible weight) and hip strength, abdominal bracing (supine press, seated press), weight bearing as tolerated    PT Home Exercise Plan  [email or web address]    Consulted and Agree with Plan of Care  Patient       Patient will benefit from skilled therapeutic intervention in order to improve the following deficits and impairments:  Abnormal gait, Hypomobility, Decreased strength, Pain, Increased muscle spasms, Difficulty walking, Decreased mobility, Decreased range of motion, Improper body mechanics, Postural dysfunction, Impaired flexibility, Decreased activity tolerance, Decreased balance, Decreased scar mobility  Visit Diagnosis: Acute midline low back pain, with sciatica presence unspecified  Pain in right hip  Other abnormalities of gait and mobility  Muscle spasm of back     Problem List Patient Active Problem List   Diagnosis Date Noted  . Lumbar stenosis with neurogenic claudication 11/11/2017  . PROSTATE SPECIFIC ANTIGEN, ELEVATED 07/19/2009  . BACK PAIN 06/11/2007  . Essential hypertension 05/21/2007  . Osteoarthritis 05/21/2007    11:37 AM,12/21/17 Sherol Dade PT, DPT [place] [facility] [place]  [facility] [address] [place], [place], [address] Phone: [phone]   Fax:  [phone]  Name:  LIPA KNAUFF MRN: [id number] Date of Birth: December 08, 1945

## 2017-12-23 ENCOUNTER — Ambulatory Visit: Payer: PPO | Admitting: Physical Therapy

## 2017-12-23 ENCOUNTER — Encounter: Payer: Self-pay | Admitting: Physical Therapy

## 2017-12-23 DIAGNOSIS — M545 Low back pain: Secondary | ICD-10-CM

## 2017-12-23 DIAGNOSIS — R2689 Other abnormalities of gait and mobility: Secondary | ICD-10-CM

## 2017-12-23 DIAGNOSIS — M25551 Pain in right hip: Secondary | ICD-10-CM

## 2017-12-23 DIAGNOSIS — M62830 Muscle spasm of back: Secondary | ICD-10-CM

## 2017-12-23 NOTE — Therapy (Signed)
[facility] [address], [place], [place], [address] Phone: [phone]   Fax:  [phone]  Physical Therapy Treatment  Patient Details  Name: Bryan [name] MRN: [id number] Date of Birth: [birth year] Referring Provider: Jovita Gamma, MD    Encounter Date: 12/23/2017  PT End of Session - 12/23/17 1159    Visit Number  8    Number of Visits  16    Date for PT Re-Evaluation  01/25/18    Authorization Type  [employer]    Authorization Time Period  11/30/17 to 01/25/18    PT Start Time  1147    PT Stop Time  1235    PT Time Calculation (min)  48 min    Activity Tolerance  No increased pain;Patient tolerated treatment well    Behavior During Therapy  [facility] for tasks assessed/performed       Past Medical History:  Diagnosis Date  . Anxiety   . Cancer ([place])    facial skin cancer  . Depression   . GERD (gastroesophageal reflux disease)    takes tums rarely  . Hypertension   . Osteoarthritis   . Spinal stenosis     Past Surgical History:  Procedure Laterality Date  . ACHILLES TENDON SURGERY  02/2006  . ANTERIOR LAT LUMBAR FUSION N/A 11/11/2017   Procedure: LUMBAR TWO- LUMBAR THREE, LUMBAR THREE- LUMBAR FOUR, LUMBAR FOUR- LUMBAR FIVE ANTERIOLATERAL INTERBODY ARTHRODESIS;  Surgeon: Jovita Gamma, MD;  Location: [place];  Service: Neurosurgery;  Laterality: N/A;  . COLONOSCOPY  2003  . COLONOSCOPY    . KNEE SURGERY     X2  . LUMBAR PERCUTANEOUS PEDICLE SCREW 3 LEVEL N/A 11/11/2017   Procedure: LUMBAR PERCUTANEOUS PEDICLE SCREW PLACEMENT LUMBAR TWO-THREE, LUMBER THREE-FOUR, LUMBAR FOUR-FIVE;  Surgeon: Jovita Gamma, MD;  Location: [place];  Service: Neurosurgery;  Laterality: N/A;  . SPINE SURGERY  12-08, 5-10  . TOTAL HIP ARTHROPLASTY      There were no vitals filed for this visit.  Subjective Assessment - 12/23/17 1149    Subjective  Pt reports that he was really sore yesterday following his last  session. He still works in the pool and tries to work everything. He walked and treaded water for about 20 minutes    Currently in Pain?  Yes    Pain Score  4     Pain Location  Hip    Pain Orientation  Right;Lateral    Pain Descriptors / Indicators  Aching    Pain Type  Chronic pain    Pain Radiating Towards  into Rt thigh     Pain Onset  More than a month ago    Pain Frequency  Constant    Aggravating Factors   alot of activity    Pain Relieving Factors  ice, medication                       [facility] Adult PT Treatment/Exercise - 12/23/17 0001      Lumbar Exercises: Aerobic   Nustep  L1 x5 min, to encourage hip/knee ROM, pt noting decrease in pain during this      Lumbar Exercises: Supine   Bridge  10 reps    Other Supine Lumbar Exercises  abdominal bracing with blue TB chops each direction x15 reps       Knee/Hip Exercises: Standing   Hip Abduction  Both    Abduction Limitations  sidestepping with yellow TB around ankles x3  trips Lt/Rt    Other Standing Knee Exercises  Rt hip extension with yellow TB 3x10 reps       Knee/Hip Exercises: Supine   Other Supine Knee/Hip Exercises  Rt hip flexion AAROM to 90 deg flexion x10 reps      Cryotherapy   Number Minutes Cryotherapy  8 Minutes    Cryotherapy Location  Lumbar Spine Rt hip and Rt knee     Type of Cryotherapy  Ice pack      Manual Therapy   Joint Mobilization  Rt hip MWM into ER/IR 2x10 bouts; Grade III-IV Rt hip long axis distraction x5 bouts             PT Education - 12/23/17 1237    Education Details  discussed benefits of consulting ortho MD regarding hip and knee pain that is limiting participation in therapy    Person(s) Educated  Patient    Methods  Explanation    Comprehension  Verbalized understanding       PT Short Term Goals - 12/07/17 1454      PT SHORT TERM GOAL #1   Title  Pt will demo consistency and independence with his HEP to decrease pain and improve RLE strength.    Time   2    Period  Weeks    Status  Achieved      PT SHORT TERM GOAL #2   Title  Pt will demo proper log roll during transitions in/out of supine which will decrease caregiver burden and improve his efficiency throughout the session.    Time  2    Period  Weeks    Status  On-going      PT SHORT TERM GOAL #3   Title  Pt will be able to complete sit to/from supine transfers independently throughout his session to decrease caregiver support at home.     Time  4    Period  Weeks    Status  On-going        PT Long Term Goals - 11/30/17 1225      PT LONG TERM GOAL #1   Title  Pt will demo improved Rt quadriceps strength to atleast 4/5 MMT which will assist with ambulating and getting in out of bed independently.     Time  8    Period  Weeks    Status  New    Target Date  01/25/18      PT LONG TERM GOAL #2   Title  Pt will be able to ascend and descend 4-5 steps without an AD and with reciprocal stepping pattern, 2/3 trials.    Baseline  --    Time  8    Period  Weeks    Status  New      PT LONG TERM GOAL #3   Title  Pt will be able to walk community distances without an AD to improve his independence and well being.     Time  8    Period  Weeks    Status  New      PT LONG TERM GOAL #4   Title  Pt will complete 5x sit to stand in less than 14 sec without UE support, which will reflect an increase in functional strength and power.     Baseline  --    Time  8    Period  Weeks    Status  New      PT LONG TERM GOAL #5  Title  Pt will report atleast 40% reduction in Rt hip/knee/low back pain from the start of therapy which will improve his quality of life.     Time  8    Period  Weeks    Status  New            Plan - 12/23/17 1259    Clinical Impression Statement  Pt continues to complete water exercise at the gym, but is limited with exercises he is able to complete during his sessions secondary to his Rt hip/knee pain. Completed mobilization to the Rt hip with some  improvements in pain during the treatment. This does not last longer than several minutes and returns once he returns to standing. Also continued with trunk strengthening therex and pt was able to complete without noted back pain or difficulty. Discussed benefits of contacting his orthopedic MD regarding hip pain and pt verbalized agreement with this.     PT Treatment/Interventions  ADLs/Self Care Home Management;Cryotherapy;Electrical Stimulation;Moist Heat;Therapeutic exercise;Therapeutic activities;Functional mobility training;Stair training;Gait training;Ultrasound;Patient/family education;Manual techniques;Taping;Dry needling;Passive range of motion;Neuromuscular re-education    PT Next Visit Plan  f/u on MD/ortho; progress quadriceps strength (increase LAQ, possible weight) and hip strength, abdominal bracing (supine press, seated press), weight bearing as tolerated    PT Home Exercise Plan  [email or web address]    Consulted and Agree with Plan of Care  Patient       Patient will benefit from skilled therapeutic intervention in order to improve the following deficits and impairments:  Abnormal gait, Hypomobility, Decreased strength, Pain, Increased muscle spasms, Difficulty walking, Decreased mobility, Decreased range of motion, Improper body mechanics, Postural dysfunction, Impaired flexibility, Decreased activity tolerance, Decreased balance, Decreased scar mobility  Visit Diagnosis: Acute midline low back pain, with sciatica presence unspecified  Pain in right hip  Other abnormalities of gait and mobility  Muscle spasm of back     Problem List Patient Active Problem List   Diagnosis Date Noted  . Lumbar stenosis with neurogenic claudication 11/11/2017  . PROSTATE SPECIFIC ANTIGEN, ELEVATED 07/19/2009  . BACK PAIN 06/11/2007  . Essential hypertension 05/21/2007  . Osteoarthritis 05/21/2007    1:05 PM,12/23/17 Sherol Dade PT, DPT [place] [facility] [address], [place], [place], [address] Phone: [phone]   Fax:  [phone]  Name: ORVIE CARADINE MRN: [id number] Date of Birth: 1945-08-05

## 2017-12-24 ENCOUNTER — Encounter: Payer: PPO | Admitting: Physical Therapy

## 2017-12-29 ENCOUNTER — Encounter: Payer: Self-pay | Admitting: Physical Therapy

## 2017-12-29 ENCOUNTER — Ambulatory Visit: Payer: PPO | Admitting: Physical Therapy

## 2017-12-29 DIAGNOSIS — M25551 Pain in right hip: Secondary | ICD-10-CM

## 2017-12-29 DIAGNOSIS — M62830 Muscle spasm of back: Secondary | ICD-10-CM

## 2017-12-29 DIAGNOSIS — R2689 Other abnormalities of gait and mobility: Secondary | ICD-10-CM

## 2017-12-29 DIAGNOSIS — M545 Low back pain: Secondary | ICD-10-CM | POA: Diagnosis not present

## 2017-12-29 NOTE — Therapy (Signed)
[facility] [address], [place], [place], [address] Phone: [phone]   Fax:  [phone]  Physical Therapy Treatment  Patient Details  Name: Bryan [name] MRN: [id number] Date of Birth: 06/24/1945 Referring Provider: Jovita Gamma, MD    Encounter Date: 12/29/2017  PT End of Session - 12/29/17 1313    Visit Number  9    Number of Visits  16    Date for PT Re-Evaluation  01/25/18    Authorization Type  [employer]    Authorization Time Period  11/30/17 to 01/25/18    PT Start Time  1231    PT Stop Time  1316    PT Time Calculation (min)  45 min    Activity Tolerance  No increased pain;Patient tolerated treatment well    Behavior During Therapy  [facility] for tasks assessed/performed       Past Medical History:  Diagnosis Date  . Anxiety   . Cancer ([place])    facial skin cancer  . Depression   . GERD (gastroesophageal reflux disease)    takes tums rarely  . Hypertension   . Osteoarthritis   . Spinal stenosis     Past Surgical History:  Procedure Laterality Date  . ACHILLES TENDON SURGERY  02/2006  . ANTERIOR LAT LUMBAR FUSION N/A 11/11/2017   Procedure: LUMBAR TWO- LUMBAR THREE, LUMBAR THREE- LUMBAR FOUR, LUMBAR FOUR- LUMBAR FIVE ANTERIOLATERAL INTERBODY ARTHRODESIS;  Surgeon: Jovita Gamma, MD;  Location: [place];  Service: Neurosurgery;  Laterality: N/A;  . COLONOSCOPY  2003  . COLONOSCOPY    . KNEE SURGERY     X2  . LUMBAR PERCUTANEOUS PEDICLE SCREW 3 LEVEL N/A 11/11/2017   Procedure: LUMBAR PERCUTANEOUS PEDICLE SCREW PLACEMENT LUMBAR TWO-THREE, LUMBER THREE-FOUR, LUMBAR FOUR-FIVE;  Surgeon: Jovita Gamma, MD;  Location: [place];  Service: Neurosurgery;  Laterality: N/A;  . SPINE SURGERY  12-08, 5-10  . TOTAL HIP ARTHROPLASTY      There were no vitals filed for this visit.  Subjective Assessment - 12/29/17 1232    Subjective  Pt reports things are continuing to get a little bit better. He  has some soreness still in the Rt hip/knee.     Currently in Pain?  Yes    Pain Score  4     Pain Location  Hip    Pain Orientation  Right;Lateral    Pain Descriptors / Indicators  Aching    Pain Type  Chronic pain    Pain Radiating Towards  into the Rt thigh     Pain Onset  More than a month ago    Pain Frequency  Constant    Aggravating Factors   walking, alot of activity    Pain Relieving Factors  ice, medication, pool exercise                       [facility] Adult PT Treatment/Exercise - 12/29/17 0001      Self-Care   Self-Care  Other Self-Care Comments    Other Self-Care Comments   self massage with rolling pin at home       Lumbar Exercises: Seated   Sit to Stand  5 reps;Limitations    Sit to Stand Limitations  x2 sets with BUE pressure into thighs    Other Seated Lumbar Exercises  BUE horizontal abduction with green TB x15 reps, D2 flexion with green TB x15 reps each     Other Seated Lumbar Exercises  seated diagonal lifts with green TB x5 reps each, HEP demo       Lumbar Exercises: Supine   Pelvic Tilt  15 reps;5 seconds    Pelvic Tilt Limitations  cues for proper breathing       Knee/Hip Exercises: Standing   Other Standing Knee Exercises  Rt knee flexion with 3# ankle weight 2x15 reps       Cryotherapy   Number Minutes Cryotherapy  7 Minutes    Cryotherapy Location  Lumbar Spine Rt knee     Type of Cryotherapy  Ice pack      Manual Therapy   Soft tissue mobilization  STM and trigger point release Rt quadriceps             PT Education - 12/29/17 1312    Education Details  self massage at home and updates to HEP; technique with therex     Person(s) Educated  Patient    Methods  Explanation;Verbal cues;Handout    Comprehension  Verbalized understanding;Returned demonstration       PT Short Term Goals - 12/29/17 1314      PT SHORT TERM GOAL #1   Title  Pt will demo consistency and independence with his HEP to decrease pain and improve RLE  strength.    Time  2    Period  Weeks    Status  Achieved      PT SHORT TERM GOAL #2   Title  Pt will demo proper log roll during transitions in/out of supine which will decrease caregiver burden and improve his efficiency throughout the session.    Time  2    Period  Weeks    Status  Achieved      PT SHORT TERM GOAL #3   Title  Pt will be able to complete sit to/from supine transfers independently throughout his session to decrease caregiver support at home.     Time  4    Period  Weeks    Status  Achieved        PT Long Term Goals - 11/30/17 1225      PT LONG TERM GOAL #1   Title  Pt will demo improved Rt quadriceps strength to atleast 4/5 MMT which will assist with ambulating and getting in out of bed independently.     Time  8    Period  Weeks    Status  New    Target Date  01/25/18      PT LONG TERM GOAL #2   Title  Pt will be able to ascend and descend 4-5 steps without an AD and with reciprocal stepping pattern, 2/3 trials.    Baseline  --    Time  8    Period  Weeks    Status  New      PT LONG TERM GOAL #3   Title  Pt will be able to walk community distances without an AD to improve his independence and well being.     Time  8    Period  Weeks    Status  New      PT LONG TERM GOAL #4   Title  Pt will complete 5x sit to stand in less than 14 sec without UE support, which will reflect an increase in functional strength and power.     Baseline  --    Time  8    Period  Weeks    Status  New  PT LONG TERM GOAL #5   Title  Pt will report atleast 40% reduction in Rt hip/knee/low back pain from the start of therapy which will improve his quality of life.     Time  8    Period  Weeks    Status  New            Plan - 12/29/17 1313    Clinical Impression Statement  Pt has met all short term goals, demonstrating increase in independence with bed mobility and sit to stand. Completed sit to stand with BUE support, noting improved mechanics with this. Ended  session with soft tissue mobilization to Rt quadriceps and discussed self-massage techniques for home. Updated pt's HEP to reflect improvements in strength and he demonstrated full understanding of this.     PT Treatment/Interventions  ADLs/Self Care Home Management;Cryotherapy;Electrical Stimulation;Moist Heat;Therapeutic exercise;Therapeutic activities;Functional mobility training;Stair training;Gait training;Ultrasound;Patient/family education;Manual techniques;Taping;Dry needling;Passive range of motion;Neuromuscular re-education    PT Next Visit Plan  progress quadriceps strength (increase LAQ, possible weight) and hip strength, abdominal bracing (supine press, seated press), weight bearing as tolerated    PT Home Exercise Plan  [email or web address]    Consulted and Agree with Plan of Care  Patient       Patient will benefit from skilled therapeutic intervention in order to improve the following deficits and impairments:  Abnormal gait, Hypomobility, Decreased strength, Pain, Increased muscle spasms, Difficulty walking, Decreased mobility, Decreased range of motion, Improper body mechanics, Postural dysfunction, Impaired flexibility, Decreased activity tolerance, Decreased balance, Decreased scar mobility  Visit Diagnosis: Acute midline low back pain, with sciatica presence unspecified  Pain in right hip  Other abnormalities of gait and mobility  Muscle spasm of back     Problem List Patient Active Problem List   Diagnosis Date Noted  . Lumbar stenosis with neurogenic claudication 11/11/2017  . PROSTATE SPECIFIC ANTIGEN, ELEVATED 07/19/2009  . BACK PAIN 06/11/2007  . Essential hypertension 05/21/2007  . Osteoarthritis 05/21/2007    1:31 PM,12/29/17 Sherol Dade PT, DPT [place] [facility] [address], [place], [place], [address] Phone: [phone]   Fax:   [phone]  Name: DELANO FRATE MRN: [id number] Date of Birth: 1945/12/29

## 2017-12-29 NOTE — Patient Instructions (Signed)
Access Code: [email or web address]  URL: [email or web address]  Date: 12/29/2017  Prepared by: Elly Modena   Exercises  Hooklying Clamshell with Resistance - 10 reps - 3 sets - 1x daily - 7x weekly  Seated Shoulder Horizontal Abduction with Resistance - 15 reps - 3 sets - 1x daily - 7x weekly  Seated Diagonal Lift with Resistance Band - 15 reps - 1x daily - 7x weekly    [facility] [address] [place], [place] [address] Phone # [phone] Fax [phone]

## 2018-01-01 ENCOUNTER — Ambulatory Visit: Payer: PPO | Admitting: Physical Therapy

## 2018-01-01 DIAGNOSIS — M545 Low back pain: Secondary | ICD-10-CM | POA: Diagnosis not present

## 2018-01-01 DIAGNOSIS — R2689 Other abnormalities of gait and mobility: Secondary | ICD-10-CM

## 2018-01-01 DIAGNOSIS — M25551 Pain in right hip: Secondary | ICD-10-CM

## 2018-01-01 DIAGNOSIS — M62830 Muscle spasm of back: Secondary | ICD-10-CM

## 2018-01-01 NOTE — Therapy (Addendum)
[facility] [address] [place], [place], [address] Phone: [phone]   Fax:  [phone]  Physical Therapy Treatment/Progress note/Discharge  Patient Details  Name: Bryan [name] MRN: [id number] Date of Birth: January 29, 1946 Referring Provider: Jovita Gamma, MD     Progress Note Reporting Period 6/17/1 to 01/01/18  See note below for Objective Data and Assessment of Progress/Goals.      Encounter Date: 01/01/2018  PT End of Session - 01/01/18 1028    Visit Number  10    Number of Visits  16    Date for PT Re-Evaluation  01/25/18    Authorization Type  [employer]    Authorization Time Period  11/30/17 to 01/25/18    PT Start Time  0930    PT Stop Time  1018    PT Time Calculation (min)  48 min    Activity Tolerance  Patient tolerated treatment well;Patient limited by pain    Behavior During Therapy  [facility] for tasks assessed/performed       Past Medical History:  Diagnosis Date  . Anxiety   . Cancer ([place])    facial skin cancer  . Depression   . GERD (gastroesophageal reflux disease)    takes tums rarely  . Hypertension   . Osteoarthritis   . Spinal stenosis     Past Surgical History:  Procedure Laterality Date  . ACHILLES TENDON SURGERY  02/2006  . ANTERIOR LAT LUMBAR FUSION N/A 11/11/2017   Procedure: LUMBAR TWO- LUMBAR THREE, LUMBAR THREE- LUMBAR FOUR, LUMBAR FOUR- LUMBAR FIVE ANTERIOLATERAL INTERBODY ARTHRODESIS;  Surgeon: Jovita Gamma, MD;  Location: [place];  Service: Neurosurgery;  Laterality: N/A;  . COLONOSCOPY  2003  . COLONOSCOPY    . KNEE SURGERY     X2  . LUMBAR PERCUTANEOUS PEDICLE SCREW 3 LEVEL N/A 11/11/2017   Procedure: LUMBAR PERCUTANEOUS PEDICLE SCREW PLACEMENT LUMBAR TWO-THREE, LUMBER THREE-FOUR, LUMBAR FOUR-FIVE;  Surgeon: Jovita Gamma, MD;  Location: [place];  Service: Neurosurgery;  Laterality: N/A;  . SPINE SURGERY  12-08, 5-10  . TOTAL HIP ARTHROPLASTY       There were no vitals filed for this visit.  Subjective Assessment - 01/01/18 0936    Subjective  Pt reports that things are still going well. He did not do his exercises like we had discussed but was able to get in the pool.     Currently in Pain?  Yes    Pain Location  Hip    Pain Orientation  Right    Pain Descriptors / Indicators  Aching    Pain Type  Chronic pain    Pain Radiating Towards  into the Rt thigh    Pain Onset  More than a month ago    Pain Frequency  Constant    Aggravating Factors   walking, alot of activity    Pain Relieving Factors  ice, medication, pool exercise                        [facility] Adult PT Treatment/Exercise - 01/01/18 0001      Lumbar Exercises: Supine   Clam  15 reps    Clam Limitations  x3 sets with blue TB    Heel Slides  Limitations;10 reps    Heel Slides Limitations  abdominal brace, RLE only x2 sets     Other Supine Lumbar Exercises  RLE hip extension isometric 2x15 reps       Knee/Hip Exercises:  Seated   Long Arc Quad  Right;2 sets;10 reps    Long Arc Quad Weight  5 lbs.      Cryotherapy   Number Minutes Cryotherapy  8 Minutes    Cryotherapy Location  Lumbar Spine Rt knee     Type of Cryotherapy  Ice pack      Manual Therapy   Soft tissue mobilization  STM and trigger point release Rt quadriceps              PT Education - 01/01/18 1028    Education Details  importance of completing HEP    Person(s) Educated  Patient    Methods  Explanation    Comprehension  Verbalized understanding       PT Short Term Goals - 01/01/18 1030      PT SHORT TERM GOAL #1   Title  Pt will demo consistency and independence with his HEP to decrease pain and improve RLE strength.    Time  2    Period  Weeks    Status  Achieved      PT SHORT TERM GOAL #2   Title  Pt will demo proper log roll during transitions in/out of supine which will decrease caregiver burden and improve his efficiency throughout the session.    Time   2    Period  Weeks    Status  Achieved      PT SHORT TERM GOAL #3   Title  Pt will be able to complete sit to/from supine transfers independently throughout his session to decrease caregiver support at home.     Time  4    Period  Weeks    Status  Achieved        PT Long Term Goals - 01/01/18 1030      PT LONG TERM GOAL #1   Title  Pt will demo improved Rt quadriceps strength to atleast 4/5 MMT which will assist with ambulating and getting in out of bed independently.     Time  8    Period  Weeks    Status  On-going      PT LONG TERM GOAL #2   Title  Pt will be able to ascend and descend 4-5 steps without an AD and with reciprocal stepping pattern, 2/3 trials.    Baseline  unable secondary Rt hip/knee pain     Time  8    Period  Weeks    Status  On-going      PT LONG TERM GOAL #3   Title  Pt will be able to walk community distances without an AD to improve his independence and well being.     Baseline  significant limp, but pt is able to ambulate without AD    Time  8    Period  Weeks    Status  Achieved      PT LONG TERM GOAL #4   Title  Pt will complete 5x sit to stand in less than 14 sec without UE support, which will reflect an increase in functional strength and power.     Time  8    Period  Weeks    Status  Not Met      PT LONG TERM GOAL #5   Title  Pt will report atleast 40% reduction in Rt hip/knee/low back pain from the start of therapy which will improve his quality of life.     Baseline  no issues with the back, pain still  remains high    Time  8    Period  Weeks    Status  New            Plan - 01/01/18 1036    Clinical Impression Statement  Pt is making slow progress towards his remaining goals. He is now able to ambulate without his SPC around the clinic, and demonstrates improved Rt quadriceps strength to 4/5 MMT. He was able to complete active heel slides without UE assistance and progressed sets and reps with other strengthening therex  performed. Pt has not been compliant with his HEP, but has been completing gentle exercise in the pool. Pt is unable to tolerate manual treatment to the quadriceps without high levels of pain reported. Pt has 2 visits remaining and we will plan to progress trunk and LE strength and finalize his updated HEP in the remaining appointments prior to likely d/c from PT.    PT Treatment/Interventions  ADLs/Self Care Home Management;Cryotherapy;Electrical Stimulation;Moist Heat;Therapeutic exercise;Therapeutic activities;Functional mobility training;Stair training;Gait training;Ultrasound;Patient/family education;Manual techniques;Taping;Dry needling;Passive range of motion;Neuromuscular re-education    PT Next Visit Plan  progress quadriceps strength (increase LAQ, possible weight) and hip strength, abdominal bracing (supine press, seated press), weight bearing as tolerated    PT Home Exercise Plan  [email or web address]    Consulted and Agree with Plan of Care  Patient       Patient will benefit from skilled therapeutic intervention in order to improve the following deficits and impairments:  Abnormal gait, Hypomobility, Decreased strength, Pain, Increased muscle spasms, Difficulty walking, Decreased mobility, Decreased range of motion, Improper body mechanics, Postural dysfunction, Impaired flexibility, Decreased activity tolerance, Decreased balance, Decreased scar mobility  Visit Diagnosis: Acute midline low back pain, with sciatica presence unspecified  Pain in right hip  Other abnormalities of gait and mobility  Muscle spasm of back     Problem List Patient Active Problem List   Diagnosis Date Noted  . Lumbar stenosis with neurogenic claudication 11/11/2017  . PROSTATE SPECIFIC ANTIGEN, ELEVATED 07/19/2009  . BACK PAIN 06/11/2007  . Essential hypertension 05/21/2007  . Osteoarthritis 05/21/2007    10:42 AM,01/01/18 Sherol Dade PT, DPT [place] [facility] [place]  [facility] [address] [place], [place], [address] Phone: [phone]   Fax:  [phone]  Name: BOGDAN VIVONA MRN: [id number] Date of Birth: [birth year]  PHYSICAL THERAPY DISCHARGE SUMMARY  Visits from Start of Care: 10  Current functional level related to goals / functional outcomes: See above for more details    Remaining deficits: See above for more details    Education / Equipment: See above for more details   Plan: Patient agrees to discharge.  Patient goals were partially met. Patient is being discharged due to a change in medical status.  ?????         Pt saw orthopedic surgeon regarding Rt hip pain and will be undergoing a THA. He is requesting to be discharged from PT at this time.   2:01 PM,01/04/18 [place], [place] [facility] [place]

## 2018-01-04 DIAGNOSIS — M1611 Unilateral primary osteoarthritis, right hip: Secondary | ICD-10-CM | POA: Diagnosis not present

## 2018-01-04 DIAGNOSIS — M25561 Pain in right knee: Secondary | ICD-10-CM | POA: Diagnosis not present

## 2018-01-04 DIAGNOSIS — M1631 Unilateral osteoarthritis resulting from hip dysplasia, right hip: Secondary | ICD-10-CM | POA: Insufficient documentation

## 2018-01-04 DIAGNOSIS — M1711 Unilateral primary osteoarthritis, right knee: Secondary | ICD-10-CM | POA: Diagnosis not present

## 2018-01-05 ENCOUNTER — Ambulatory Visit: Payer: PPO | Admitting: Physical Therapy

## 2018-01-08 ENCOUNTER — Encounter: Payer: PPO | Admitting: Physical Therapy

## 2018-01-12 DIAGNOSIS — L821 Other seborrheic keratosis: Secondary | ICD-10-CM | POA: Diagnosis not present

## 2018-01-12 DIAGNOSIS — L570 Actinic keratosis: Secondary | ICD-10-CM | POA: Diagnosis not present

## 2018-01-12 DIAGNOSIS — Z85828 Personal history of other malignant neoplasm of skin: Secondary | ICD-10-CM | POA: Diagnosis not present

## 2018-01-25 ENCOUNTER — Ambulatory Visit (INDEPENDENT_AMBULATORY_CARE_PROVIDER_SITE_OTHER): Payer: PPO | Admitting: Internal Medicine

## 2018-01-25 ENCOUNTER — Encounter: Payer: Self-pay | Admitting: Internal Medicine

## 2018-01-25 VITALS — BP 110/78 | HR 64 | Temp 98.8°F | Wt 194.6 lb

## 2018-01-25 DIAGNOSIS — M150 Primary generalized (osteo)arthritis: Secondary | ICD-10-CM | POA: Diagnosis not present

## 2018-01-25 DIAGNOSIS — M48062 Spinal stenosis, lumbar region with neurogenic claudication: Secondary | ICD-10-CM

## 2018-01-25 DIAGNOSIS — Z0000 Encounter for general adult medical examination without abnormal findings: Secondary | ICD-10-CM

## 2018-01-25 DIAGNOSIS — R972 Elevated prostate specific antigen [PSA]: Secondary | ICD-10-CM | POA: Diagnosis not present

## 2018-01-25 DIAGNOSIS — I10 Essential (primary) hypertension: Secondary | ICD-10-CM

## 2018-01-25 DIAGNOSIS — M159 Polyosteoarthritis, unspecified: Secondary | ICD-10-CM

## 2018-01-25 DIAGNOSIS — E785 Hyperlipidemia, unspecified: Secondary | ICD-10-CM

## 2018-01-25 LAB — LIPID PANEL
Cholesterol: 183 mg/dL (ref 0–200)
HDL: 38.3 mg/dL — ABNORMAL LOW (ref >39.00)
NonHDL: 144.93
TRIGLYCERIDES: 216 mg/dL — AB (ref 0.0–149.0)
Total CHOL/HDL Ratio: 5
VLDL: 43.2 mg/dL — ABNORMAL HIGH (ref 0.0–40.0)

## 2018-01-25 LAB — LDL CHOLESTEROL, DIRECT: Direct LDL: 118 mg/dL

## 2018-01-25 LAB — TSH: TSH: 1.69 u[IU]/mL (ref 0.35–4.50)

## 2018-01-25 MED ORDER — HYDROCODONE-ACETAMINOPHEN 5-325 MG PO TABS: 1.0000 | ORAL_TABLET | ORAL | 0 refills | Status: DC | PRN

## 2018-01-25 MED ORDER — LISINOPRIL-HYDROCHLOROTHIAZIDE 20-25 MG PO TABS: 1.0000 | ORAL_TABLET | Freq: Every day | ORAL | 4 refills | Status: DC

## 2018-01-25 NOTE — Progress Notes (Signed)
Subjective:    Patient ID: Bryan [name], male    DOB: [birth year], 73 y.o.   MRN: [id number]  HPI  72 year old patient who is seen today for an annual preventive health examination, preoperative surgical clearance as well as a subsequent Medicare wellness visit He has a history of advanced osteoarthritis and earlier has had repeat lumbar surgery on Nov 11, 2017.  He continues to have significant right hip pain as well as advanced degenerative changes.  He is scheduled for elective right total hip replacement surgery. He has been followed by urology with a chronically elevated PSA and has had over the years 3- prostate biopsies as well as a prostate MR exam Except for arthritic complaints does remarkably well. He has essential hypertension that has been well controlled.  He denies any cardiopulmonary complaints.  Past Medical History:  Diagnosis Date  . Anxiety   . Cancer ([place])    facial skin cancer  . Depression   . GERD (gastroesophageal reflux disease)    takes tums rarely  . Hypertension   . Osteoarthritis   . Spinal stenosis      Social History   Socioeconomic History  . Marital status: Married    Spouse name: Not on file  . Number of children: Not on file  . Years of education: Not on file  . Highest education level: Not on file  Occupational History  . Not on file  Social Needs  . Financial resource strain: Not on file  . Food insecurity:    Worry: Not on file    Inability: Not on file  . Transportation needs:    Medical: Not on file    Non-medical: Not on file  Tobacco Use  . Smoking status: Never Smoker  . Smokeless tobacco: Never Used  Substance and Sexual Activity  . Alcohol use: No  . Drug use: No  . Sexual activity: Not on file  Lifestyle  . Physical activity:    Days per week: Not on file    Minutes per session: Not on file  . Stress: Not on file  Relationships  . Social connections:    Talks on phone: Not on file    Gets together: Not on  file    Attends religious service: Not on file    Active member of club or organization: Not on file    Attends meetings of clubs or organizations: Not on file    Relationship status: Not on file  . Intimate partner violence:    Fear of current or ex partner: Not on file    Emotionally abused: Not on file    Physically abused: Not on file    Forced sexual activity: Not on file  Other Topics Concern  . Not on file  Social History Narrative   Divorced, Exercises regularly    Past Surgical History:  Procedure Laterality Date  . ACHILLES TENDON SURGERY  02/2006  . ANTERIOR LAT LUMBAR FUSION N/A 11/11/2017   Procedure: LUMBAR TWO- LUMBAR THREE, LUMBAR THREE- LUMBAR FOUR, LUMBAR FOUR- LUMBAR FIVE ANTERIOLATERAL INTERBODY ARTHRODESIS;  Surgeon: Jovita Gamma, MD;  Location: [place];  Service: Neurosurgery;  Laterality: N/A;  . COLONOSCOPY  2003  . COLONOSCOPY    . KNEE SURGERY     X2  . LUMBAR PERCUTANEOUS PEDICLE SCREW 3 LEVEL N/A 11/11/2017   Procedure: LUMBAR PERCUTANEOUS PEDICLE SCREW PLACEMENT LUMBAR TWO-THREE, LUMBER THREE-FOUR, LUMBAR FOUR-FIVE;  Surgeon: Jovita Gamma, MD;  Location: [place];  Service: Neurosurgery;  Laterality:  N/A;  . SPINE SURGERY  12-08, 5-10  . TOTAL HIP ARTHROPLASTY      Family History  Problem Relation Age of Onset  . Diabetes Mother   . Coronary artery disease Mother   . Hypertension Mother   . Colon polyps Neg Hx   . Esophageal cancer Neg Hx   . Rectal cancer Neg Hx   . Stomach cancer Neg Hx     Allergies  Allergen Reactions  . Codeine Other (See Comments)    "made me sick"    Current Outpatient Medications on File Prior to Visit  Medication Sig Dispense Refill  . meloxicam (MOBIC) 15 MG tablet Take 1 tablet (15 mg total) by mouth daily. 90 tablet 3  . oxyCODONE-acetaminophen (PERCOCET/ROXICET) 5-325 MG tablet Take 1-2 tablets by mouth every 4 (four) hours as needed (breakthrough pain). 30 tablet 0  . acetaminophen (TYLENOL) 500 MG tablet  Take 1,000 mg by mouth every 6 (six) hours as needed for moderate pain.      No current facility-administered medications on file prior to visit.     BP 110/78 (BP Location: Right Arm, Patient Position: Sitting, Cuff Size: Large)   Pulse 64   Temp 98.8 F (37.1 C) (Oral)   Wt 194 lb 9.6 oz (88.3 kg)   SpO2 93%   BMI 24.32 kg/m   Subsequent Medicare wellness visit  1. Risk factors, based on past  M,S,F history:   cardiovascular risk factors have included essential hypertension only  2.  Physical activities: Ordinarily quite active with activities that include golf more recently limited by orthopedic issues.  Is status post lumbar fusion in May of this year and presently limited by advanced degenerative disease of the right hip area  3.  Depression/mood: No history major depression or mood disorder  4.  Hearing: No deficits  5.  ADL's: Independent  6.  Fall risk: Low  7.  Home safety: No issues identified  8.  Height weight, and visual acuity; there is been some weight loss over the past several months due to chronic pain, rehab  9.  Counseling: Urology follow-up  10. Lab orders based on risk factors: We will check laboratory update including lipid profile  11. Referral : Orthopedic follow-up  12. Care plan: Continue efforts at aggressive risk factor modification  13. Cognitive assessment: Alert and oriented with normal affect.  No cognitive dysfunction  14. Screening: Patient provided with a written and personalized 5-10 year screening schedule in the AVS.    15. Provider List Update: Orthopedics primary care urology     Review of Systems  Constitutional: Positive for unexpected weight change. Negative for appetite change, chills, fatigue and fever.  HENT: Negative for congestion, dental problem, ear pain, hearing loss, sore throat, tinnitus, trouble swallowing and voice change.   Eyes: Negative for pain, discharge and visual disturbance.  Respiratory: Negative for  cough, chest tightness, wheezing and stridor.   Cardiovascular: Negative for chest pain, palpitations and leg swelling.  Gastrointestinal: Negative for abdominal distention, abdominal pain, blood in stool, constipation, diarrhea, nausea and vomiting.  Genitourinary: Negative for difficulty urinating, discharge, flank pain, genital sores, hematuria and urgency.  Musculoskeletal: Positive for arthralgias, back pain and gait problem. Negative for joint swelling, myalgias and neck stiffness.  Skin: Negative for rash.  Neurological: Negative for dizziness, syncope, speech difficulty, weakness, numbness and headaches.  Hematological: Negative for adenopathy. Does not bruise/bleed easily.  Psychiatric/Behavioral: Negative for behavioral problems and dysphoric mood. The patient is not nervous/anxious.  Objective:   Physical Exam  Constitutional: He is oriented to person, place, and time. He appears well-developed. No distress.  Blood pressure well controlled  HENT:  Head: Normocephalic.  Right Ear: External ear normal.  Left Ear: External ear normal.  Eyes: Conjunctivae and EOM are normal.  Neck: Normal range of motion.  Cardiovascular: Normal rate and normal heart sounds.  Pulmonary/Chest: Breath sounds normal.  Abdominal: Bowel sounds are normal.  Genitourinary: Penis normal.  Musculoskeletal: Normal range of motion. He exhibits no edema or tenderness.  Neurological: He is alert and oriented to person, place, and time.  Skin: Skin is dry.  Laminectomy scars  Psychiatric: He has a normal mood and affect. His behavior is normal.          Assessment & Plan:  Preventive health examination. Medically stable for elective orthopedic surgery Subsequent Medicare wellness visit Essential hypertension stable Advanced degenerative joint disease right hip Status post lumbar fusion History of BPH and elevated PSA.  Follow-up urology  Will check updated lab Medically cleared for  elective orthopedic surgery  Return in 6- 12 months with a new provider  Marletta Lor

## 2018-01-25 NOTE — Patient Instructions (Signed)
Limit your sodium (Salt) intake  Please check your blood pressure on a regular basis.  If it is consistently greater than 150/90, please make an office appointment.  Return in one year for follow-up  

## 2018-01-26 ENCOUNTER — Other Ambulatory Visit: Payer: Self-pay | Admitting: Internal Medicine

## 2018-01-26 ENCOUNTER — Telehealth: Payer: Self-pay | Admitting: Internal Medicine

## 2018-01-26 DIAGNOSIS — Z981 Arthrodesis status: Secondary | ICD-10-CM | POA: Diagnosis not present

## 2018-01-26 NOTE — Telephone Encounter (Signed)
Pts wife dropped off a Surgical Clearance form to be filled out and faxed to Derl Barrow [phone] before 02/09/18 when the surgery will be and would also like to have a call at [phone] to pick up a copy. Form was put in the providers folder for completion.

## 2018-01-27 NOTE — Telephone Encounter (Signed)
Pt informed copy of form is ready for pick-up.

## 2018-02-01 NOTE — Progress Notes (Signed)
01/25/2018- medical clearance from Dr. Burnice Logan on chart with labs noted in [place]- LDL cholesterol,Lipid,TSh  11/10/2017- noted in [place]

## 2018-02-01 NOTE — Patient Instructions (Signed)
LAMARR FEENSTRA  02/01/2018   Your procedure is scheduled on: Tuesday 02/09/2018  Report to [facility] Main  Entrance              Report to admitting at  0620  AM    Call this number if you have problems the morning of surgery [phone]    Remember: Do not eat food or drink liquids :After Midnight.     Take these medicines the morning of surgery with A SIP OF WATER: none                                 You may not have any metal on your body including hair pins and              piercings  Do not wear jewelry, make-up, lotions, powders or perfumes, deodorant             Men may shave face and neck.   Do not bring valuables to the hospital. [place].  Contacts, dentures or bridgework may not be worn into surgery.  Leave suitcase in the car. After surgery it may be brought to your room.                  Please read over the following fact sheets you were given: _____________________________________________________________________             [facility] - Preparing for Surgery Before surgery, you can play an important role.  Because skin is not sterile, your skin needs to be as free of germs as possible.  You can reduce the number of germs on your skin by washing with CHG (chlorahexidine gluconate) soap before surgery.  CHG is an antiseptic cleaner which kills germs and bonds with the skin to continue killing germs even after washing. Please DO NOT use if you have an allergy to CHG or antibacterial soaps.  If your skin becomes reddened/irritated stop using the CHG and inform your nurse when you arrive at Short Stay. Do not shave (including legs and underarms) for at least 48 hours prior to the first CHG shower.  You may shave your face/neck. Please follow these instructions carefully:  1.  Shower with CHG Soap the night before surgery and the  morning of Surgery.  2.  If you choose to wash your hair,  wash your hair first as usual with your  normal  shampoo.  3.  After you shampoo, rinse your hair and body thoroughly to remove the  shampoo.                           4.  Use CHG as you would any other liquid soap.  You can apply chg directly  to the skin and wash                       Gently with a scrungie or clean washcloth.  5.  Apply the CHG Soap to your body ONLY FROM THE NECK DOWN.   Do not use on face/ open  Wound or open sores. Avoid contact with eyes, ears mouth and genitals (private parts).                       Wash face,  Genitals (private parts) with your normal soap.             6.  Wash thoroughly, paying special attention to the area where your surgery  will be performed.  7.  Thoroughly rinse your body with warm water from the neck down.  8.  DO NOT shower/wash with your normal soap after using and rinsing off  the CHG Soap.                9.  Pat yourself dry with a clean towel.            10.  Wear clean pajamas.            11.  Place clean sheets on your bed the night of your first shower and do not  sleep with pets. Day of Surgery : Do not apply any lotions/deodorants the morning of surgery.  Please wear clean clothes to the hospital/surgery center.  FAILURE TO FOLLOW THESE INSTRUCTIONS MAY RESULT IN THE CANCELLATION OF YOUR SURGERY PATIENT SIGNATURE_________________________________  NURSE SIGNATURE__________________________________  ________________________________________________________________________   Adam Phenix  An incentive spirometer is a tool that can help keep your lungs clear and active. This tool measures how well you are filling your lungs with each breath. Taking long deep breaths may help reverse or decrease the chance of developing breathing (pulmonary) problems (especially infection) following:  A long period of time when you are unable to move or be active. BEFORE THE PROCEDURE   If the spirometer includes an  indicator to show your best effort, your nurse or respiratory therapist will set it to a desired goal.  If possible, sit up straight or lean slightly forward. Try not to slouch.  Hold the incentive spirometer in an upright position. INSTRUCTIONS FOR USE  1. Sit on the edge of your bed if possible, or sit up as far as you can in bed or on a chair. 2. Hold the incentive spirometer in an upright position. 3. Breathe out normally. 4. Place the mouthpiece in your mouth and seal your lips tightly around it. 5. Breathe in slowly and as deeply as possible, raising the piston or the ball toward the top of the column. 6. Hold your breath for 3-5 seconds or for as long as possible. Allow the piston or ball to fall to the bottom of the column. 7. Remove the mouthpiece from your mouth and breathe out normally. 8. Rest for a few seconds and repeat Steps 1 through 7 at least 10 times every 1-2 hours when you are awake. Take your time and take a few normal breaths between deep breaths. 9. The spirometer may include an indicator to show your best effort. Use the indicator as a goal to work toward during each repetition. 10. After each set of 10 deep breaths, practice coughing to be sure your lungs are clear. If you have an incision (the cut made at the time of surgery), support your incision when coughing by placing a pillow or rolled up towels firmly against it. Once you are able to get out of bed, walk around indoors and cough well. You may stop using the incentive spirometer when instructed by your caregiver.  RISKS AND COMPLICATIONS  Take your time so you do not get  dizzy or light-headed.  If you are in pain, you may need to take or ask for pain medication before doing incentive spirometry. It is harder to take a deep breath if you are having pain. AFTER USE  Rest and breathe slowly and easily.  It can be helpful to keep track of a log of your progress. Your caregiver can provide you with a simple table  to help with this. If you are using the spirometer at home, follow these instructions: [place] IF:   You are having difficultly using the spirometer.  You have trouble using the spirometer as often as instructed.  Your pain medication is not giving enough relief while using the spirometer.  You develop fever of 100.5 F (38.1 C) or higher. SEEK IMMEDIATE MEDICAL CARE IF:   You cough up bloody sputum that had not been present before.  You develop fever of 102 F (38.9 C) or greater.  You develop worsening pain at or near the incision site. MAKE SURE YOU:   Understand these instructions.  Will watch your condition.  Will get help right away if you are not doing well or get worse. Document Released: 10/13/2006 Document Revised: 08/25/2011 Document Reviewed: 12/14/2006 [facility] Patient Information 2014 [place], [place].   ________________________________________________________________________

## 2018-02-02 ENCOUNTER — Encounter (HOSPITAL_COMMUNITY): Payer: Self-pay

## 2018-02-02 ENCOUNTER — Other Ambulatory Visit: Payer: Self-pay

## 2018-02-02 ENCOUNTER — Encounter (HOSPITAL_COMMUNITY)
Admission: RE | Admit: 2018-02-02 | Discharge: 2018-02-02 | Disposition: A | Discharge status: Home / Self Care | Payer: PPO | Source: Ambulatory Visit | Attending: Orthopedic Surgery | Admitting: Orthopedic Surgery

## 2018-02-02 DIAGNOSIS — M1611 Unilateral primary osteoarthritis, right hip: Secondary | ICD-10-CM | POA: Diagnosis not present

## 2018-02-02 DIAGNOSIS — Z01818 Encounter for other preprocedural examination: Secondary | ICD-10-CM | POA: Diagnosis not present

## 2018-02-02 LAB — ABO/RH: ABO/RH(D): O POS

## 2018-02-02 LAB — BASIC METABOLIC PANEL
Anion gap: 7 (ref 5–15)
BUN: 24 mg/dL — AB (ref 8–23)
CALCIUM: 10.2 mg/dL (ref 8.9–10.3)
CO2: 32 mmol/L (ref 22–32)
CREATININE: 1.17 mg/dL (ref 0.61–1.24)
Chloride: 104 mmol/L (ref 98–111)
GFR calc Af Amer: >60 mL/min (ref >60)
GLUCOSE: 88 mg/dL (ref 70–99)
Potassium: 4 mmol/L (ref 3.5–5.1)
Sodium: 143 mmol/L (ref 135–145)

## 2018-02-02 LAB — CBC
HCT: 48.2 % (ref 39.0–52.0)
HEMOGLOBIN: 15.7 g/dL (ref 13.0–17.0)
MCH: 29.7 pg (ref 26.0–34.0)
MCHC: 32.6 g/dL (ref 30.0–36.0)
MCV: 91.1 fL (ref 78.0–100.0)
PLATELETS: 250 10*3/uL (ref 150–400)
RBC: 5.29 MIL/uL (ref 4.22–5.81)
RDW: 13 % (ref 11.5–15.5)
WBC: 5.3 10*3/uL (ref 4.0–10.5)

## 2018-02-02 LAB — SURGICAL PCR SCREEN
MRSA, PCR: NEGATIVE
Staphylococcus aureus: NEGATIVE

## 2018-02-07 NOTE — H&P (Signed)
TOTAL HIP ADMISSION H&P  Patient is admitted for right total hip arthroplasty, anterior approach.  Subjective:  Chief Complaint:   Right hip primary OA / pain  HPI: Bryan [name], 72 y.o. male, has a history of pain and functional disability in the right hip(s) due to arthritis and patient has failed non-surgical conservative treatments for greater than 12 weeks to include NSAID's and/or analgesics, use of assistive devices and activity modification.  Onset of symptoms was gradual starting <1 year ago with rapidlly worsening course since that time.The patient noted no past surgery on the right hip(s).  Patient currently rates pain in the right hip at 10 out of 10 with activity. Patient has night pain, worsening of pain with activity and weight bearing, trendelenberg gait, pain that interfers with activities of daily living and pain with passive range of motion. Patient has evidence of periarticular osteophytes and joint space narrowing by imaging studies. This condition presents safety issues increasing the risk of falls.   There is no current active infection.  Risks, benefits and expectations were discussed with the patient.  Risks including but not limited to the risk of anesthesia, blood clots, nerve damage, blood vessel damage, failure of the prosthesis, infection and up to and including death.  Patient understand the risks, benefits and expectations and wishes to proceed with surgery.   PCP: Marletta Lor, MD  D/C Plans:       Home   Post-op Meds:       No Rx given  Tranexamic Acid:      To be given - IV   Decadron:      Is to be given  FYI:      ASA  Norco  DME:   Pt already has equipment   PT:   No PT    Patient Active Problem List   Diagnosis Date Noted  . Lumbar stenosis with neurogenic claudication 11/11/2017  . PROSTATE SPECIFIC ANTIGEN, ELEVATED 07/19/2009  . BACK PAIN 06/11/2007  . Essential hypertension 05/21/2007  . Osteoarthritis 05/21/2007   Past Medical  History:  Diagnosis Date  . Anxiety   . Cancer ([place])    facial skin cancer  . Depression   . GERD (gastroesophageal reflux disease)    takes tums rarely  . Hypertension   . Osteoarthritis   . Spinal stenosis     Past Surgical History:  Procedure Laterality Date  . ACHILLES TENDON SURGERY  02/2006  . ANTERIOR LAT LUMBAR FUSION N/A 11/11/2017   Procedure: LUMBAR TWO- LUMBAR THREE, LUMBAR THREE- LUMBAR FOUR, LUMBAR FOUR- LUMBAR FIVE ANTERIOLATERAL INTERBODY ARTHRODESIS;  Surgeon: Jovita Gamma, MD;  Location: [facility];  Service: Neurosurgery;  Laterality: N/A;  . COLONOSCOPY  2003  . COLONOSCOPY    . KNEE SURGERY     X2  . LUMBAR PERCUTANEOUS PEDICLE SCREW 3 LEVEL N/A 11/11/2017   Procedure: LUMBAR PERCUTANEOUS PEDICLE SCREW PLACEMENT LUMBAR TWO-THREE, LUMBER THREE-FOUR, LUMBAR FOUR-FIVE;  Surgeon: Jovita Gamma, MD;  Location: [facility];  Service: Neurosurgery;  Laterality: N/A;  . SPINE SURGERY  12-08, 5-10  . TOTAL HIP ARTHROPLASTY      No current facility-administered medications for this encounter.    Current Outpatient Medications  Medication Sig Dispense Refill Last Dose  . HYDROcodone-acetaminophen (NORCO/VICODIN) 5-325 MG tablet Take 1-2 tablets by mouth every 4 (four) hours as needed (pain). (Patient taking differently: Take 1 tablet by mouth every 6 (six) hours as needed for moderate pain. ) 70 tablet 0   . lisinopril-hydrochlorothiazide (  PRINZIDE,ZESTORETIC) 20-25 MG tablet Take 1 tablet by mouth daily. 90 tablet 4   . OVER THE COUNTER MEDICATION Take 1 drop by mouth 2 (two) times daily. CBD Oil     . tiZANidine (ZANAFLEX) 4 MG tablet Take 4 mg by mouth 3 (three) times daily as needed for muscle spasms.     . meloxicam (MOBIC) 15 MG tablet TAKE 1 TABLET ONCE DAILY. 90 tablet 0   . oxyCODONE-acetaminophen (PERCOCET/ROXICET) 5-325 MG tablet Take 1-2 tablets by mouth every 4 (four) hours as needed (breakthrough pain). (Patient not taking: Reported on 01/28/2018) 30 tablet 0  Completed Course at Unknown time   Allergies  Allergen Reactions  . Codeine Other (See Comments)    "made me sick"    Social History   Tobacco Use  . Smoking status: Never Smoker  . Smokeless tobacco: Never Used  Substance Use Topics  . Alcohol use: No    Family History  Problem Relation Age of Onset  . Diabetes Mother   . Coronary artery disease Mother   . Hypertension Mother   . Colon polyps Neg Hx   . Esophageal cancer Neg Hx   . Rectal cancer Neg Hx   . Stomach cancer Neg Hx      Review of Systems  Constitutional: Negative.   HENT: Negative.   Eyes: Negative.   Respiratory: Negative.   Cardiovascular: Negative.   Gastrointestinal: Positive for constipation.  Genitourinary: Negative.   Musculoskeletal: Positive for back pain and joint pain.  Skin: Negative.   Neurological: Negative.   Endo/Heme/Allergies: Negative.   Psychiatric/Behavioral: Positive for depression. The patient is nervous/anxious.     Objective:  Physical Exam  Constitutional: He is oriented to person, place, and time. He appears well-developed.  HENT:  Head: Normocephalic.  Eyes: Pupils are equal, round, and reactive to light.  Neck: Neck supple. No JVD present. No tracheal deviation present. No thyromegaly present.  Cardiovascular: Normal rate, regular rhythm and intact distal pulses.  Respiratory: Effort normal and breath sounds normal. No respiratory distress. He has no wheezes.  GI: Soft. There is no tenderness. There is no guarding.  Musculoskeletal:       Right hip: He exhibits decreased range of motion, decreased strength, tenderness and bony tenderness. He exhibits no swelling, no deformity and no laceration.  Lymphadenopathy:    He has no cervical adenopathy.  Neurological: He is alert and oriented to person, place, and time.  Skin: Skin is warm and dry.  Psychiatric: He has a normal mood and affect.       Labs:  Estimated body mass index is 24.25 kg/m as calculated from  the following:   Height as of 02/02/18: 6' 3.5" (1.918 m).   Weight as of 02/02/18: 89.2 kg.   Imaging Review Plain radiographs demonstrate severe degenerative joint disease of the right hip(s). The bone quality appears to be good for age and reported activity level.    Preoperative templating of the joint replacement has been completed, documented, and submitted to the Operating Room personnel in order to optimize intra-operative equipment management.     Assessment/Plan:  End stage arthritis, right hip  The patient history, physical examination, clinical judgement of the provider and imaging studies are consistent with end stage degenerative joint disease of the right hip(s) and total hip arthroplasty is deemed medically necessary. The treatment options including medical management, injection therapy, arthroscopy and arthroplasty were discussed at length. The risks and benefits of total hip arthroplasty were presented and reviewed.  The risks due to aseptic loosening, infection, stiffness, dislocation/subluxation,  thromboembolic complications and other imponderables were discussed.  The patient acknowledged the explanation, agreed to proceed with the plan and consent was signed. Patient is being admitted for inpatient treatment for surgery, pain control, PT, OT, prophylactic antibiotics, VTE prophylaxis, progressive ambulation and ADL's and discharge planning.The patient is planning to be discharged home with home health services.     [place] Pugh Jilda Kress   PA-C  02/07/2018, 11:03 PM

## 2018-02-08 NOTE — Anesthesia Preprocedure Evaluation (Addendum)
Anesthesia Evaluation  Patient identified by MRN, date of birth, ID band Patient awake    Reviewed: Allergy & Precautions, NPO status , Patient's Chart, lab work & pertinent test results  Airway Mallampati: II  TM Distance: >3 FB Neck ROM: Full    Dental  (+) Dental Advisory Given   Pulmonary neg pulmonary ROS,    breath sounds clear to auscultation       Cardiovascular hypertension, Pt. on medications  Rhythm:Regular Rate:Normal     Neuro/Psych negative neurological ROS     GI/Hepatic Neg liver ROS, GERD  ,  Endo/Other  negative endocrine ROS  Renal/GU negative Renal ROS     Musculoskeletal  (+) Arthritis ,   Abdominal   Peds  Hematology negative hematology ROS (+)   Anesthesia Other Findings   Reproductive/Obstetrics                             Lab Results  Component Value Date   WBC 5.3 02/02/2018   HGB 15.7 02/02/2018   HCT 48.2 02/02/2018   MCV 91.1 02/02/2018   PLT 250 02/02/2018   Lab Results  Component Value Date   CREATININE 1.17 02/02/2018   BUN 24 (H) 02/02/2018   NA 143 02/02/2018   K 4.0 02/02/2018   CL 104 02/02/2018   CO2 32 02/02/2018    Anesthesia Physical  Anesthesia Plan  ASA: II  Anesthesia Plan: General   Post-op Pain Management:    Induction: Intravenous  PONV Risk Score and Plan: 2 and Ondansetron, Dexamethasone and Treatment may vary due to age or medical condition  Airway Management Planned: Oral ETT  Additional Equipment:   Intra-op Plan:   Post-operative Plan: Extubation in OR  Informed Consent: I have reviewed the patients History and Physical, chart, labs and discussed the procedure including the risks, benefits and alternatives for the proposed anesthesia with the patient or authorized representative who has indicated his/her understanding and acceptance.   Dental advisory given  Plan Discussed with: CRNA  Anesthesia Plan  Comments:         Anesthesia Quick Evaluation

## 2018-02-09 ENCOUNTER — Encounter (HOSPITAL_COMMUNITY): Payer: Self-pay | Admitting: *Deleted

## 2018-02-09 ENCOUNTER — Inpatient Hospital Stay (HOSPITAL_COMMUNITY): Payer: PPO | Admitting: Anesthesiology

## 2018-02-09 ENCOUNTER — Inpatient Hospital Stay (HOSPITAL_COMMUNITY): Payer: PPO

## 2018-02-09 ENCOUNTER — Inpatient Hospital Stay (HOSPITAL_COMMUNITY)
Admission: RE | Admit: 2018-02-09 | Discharge: 2018-02-10 | DRG: 470 | Disposition: A | Discharge status: Home / Self Care | Payer: PPO | Attending: Orthopedic Surgery | Admitting: Orthopedic Surgery

## 2018-02-09 ENCOUNTER — Other Ambulatory Visit: Payer: Self-pay

## 2018-02-09 ENCOUNTER — Encounter (HOSPITAL_COMMUNITY)
Admission: RE | Disposition: A | Discharge status: Home / Self Care | Payer: Self-pay | Source: Ambulatory Visit | Attending: Orthopedic Surgery

## 2018-02-09 DIAGNOSIS — K219 Gastro-esophageal reflux disease without esophagitis: Secondary | ICD-10-CM | POA: Diagnosis present

## 2018-02-09 DIAGNOSIS — Z471 Aftercare following joint replacement surgery: Secondary | ICD-10-CM | POA: Diagnosis not present

## 2018-02-09 DIAGNOSIS — F419 Anxiety disorder, unspecified: Secondary | ICD-10-CM | POA: Diagnosis not present

## 2018-02-09 DIAGNOSIS — Z791 Long term (current) use of non-steroidal anti-inflammatories (NSAID): Secondary | ICD-10-CM

## 2018-02-09 DIAGNOSIS — K5900 Constipation, unspecified: Secondary | ICD-10-CM | POA: Diagnosis not present

## 2018-02-09 DIAGNOSIS — M1611 Unilateral primary osteoarthritis, right hip: Principal | ICD-10-CM | POA: Diagnosis present

## 2018-02-09 DIAGNOSIS — Z85828 Personal history of other malignant neoplasm of skin: Secondary | ICD-10-CM

## 2018-02-09 DIAGNOSIS — F329 Major depressive disorder, single episode, unspecified: Secondary | ICD-10-CM | POA: Diagnosis not present

## 2018-02-09 DIAGNOSIS — Z96641 Presence of right artificial hip joint: Secondary | ICD-10-CM

## 2018-02-09 DIAGNOSIS — Z96649 Presence of unspecified artificial hip joint: Secondary | ICD-10-CM

## 2018-02-09 DIAGNOSIS — I10 Essential (primary) hypertension: Secondary | ICD-10-CM | POA: Diagnosis present

## 2018-02-09 DIAGNOSIS — M48062 Spinal stenosis, lumbar region with neurogenic claudication: Secondary | ICD-10-CM | POA: Diagnosis not present

## 2018-02-09 HISTORY — PX: TOTAL HIP ARTHROPLASTY: SHX124

## 2018-02-09 LAB — TYPE AND SCREEN
ABO/RH(D): O POS
Antibody Screen: NEGATIVE

## 2018-02-09 SURGERY — ARTHROPLASTY, HIP, TOTAL, ANTERIOR APPROACH
Anesthesia: General | Site: Hip | Laterality: Right

## 2018-02-09 MED ORDER — PHENOL 1.4 % MT LIQD: 1.0000 | OROMUCOSAL | Status: DC | PRN

## 2018-02-09 MED ORDER — POLYETHYLENE GLYCOL 3350 17 G PO PACK: 17.0000 g | PACK | Freq: Two times a day (BID) | ORAL | 0 refills | Status: DC

## 2018-02-09 MED ORDER — HYDROMORPHONE HCL 1 MG/ML IJ SOLN
0.2500 mg | INTRAMUSCULAR | Status: DC | PRN
  Administered 2018-02-09 (×4): 0.5 mg via INTRAVENOUS

## 2018-02-09 MED ORDER — ALUM & MAG HYDROXIDE-SIMETH 200-200-20 MG/5ML PO SUSP: 15.0000 mL | ORAL | Status: DC | PRN

## 2018-02-09 MED ORDER — PROPOFOL 10 MG/ML IV BOLUS
INTRAVENOUS | Status: AC
  Filled 2018-02-09: qty 20

## 2018-02-09 MED ORDER — HYDROMORPHONE HCL 1 MG/ML IJ SOLN
INTRAMUSCULAR | Status: AC
  Filled 2018-02-09: qty 1

## 2018-02-09 MED ORDER — FENTANYL CITRATE (PF) 100 MCG/2ML IJ SOLN
INTRAMUSCULAR | Status: AC
  Filled 2018-02-09: qty 2

## 2018-02-09 MED ORDER — METOCLOPRAMIDE HCL 5 MG PO TABS
5.0000 mg | ORAL_TABLET | Freq: Three times a day (TID) | ORAL | Status: DC | PRN
  Administered 2018-02-09: 5 mg via ORAL
  Filled 2018-02-09: qty 1

## 2018-02-09 MED ORDER — EPHEDRINE 5 MG/ML INJ
INTRAVENOUS | Status: AC
  Filled 2018-02-09: qty 10

## 2018-02-09 MED ORDER — ONDANSETRON HCL 4 MG/2ML IJ SOLN
INTRAMUSCULAR | Status: AC
  Filled 2018-02-09: qty 2

## 2018-02-09 MED ORDER — MORPHINE SULFATE (PF) 2 MG/ML IV SOLN: 0.5000 mg | INTRAVENOUS | Status: DC | PRN

## 2018-02-09 MED ORDER — CHLORHEXIDINE GLUCONATE 4 % EX LIQD: 60.0000 mL | Freq: Once | CUTANEOUS | Status: DC

## 2018-02-09 MED ORDER — ONDANSETRON HCL 4 MG/2ML IJ SOLN: 4.0000 mg | Freq: Four times a day (QID) | INTRAMUSCULAR | Status: DC | PRN

## 2018-02-09 MED ORDER — CEFAZOLIN SODIUM-DEXTROSE 2-4 GM/100ML-% IV SOLN
2.0000 g | INTRAVENOUS | Status: AC
  Administered 2018-02-09: 2 g via INTRAVENOUS
  Filled 2018-02-09: qty 100

## 2018-02-09 MED ORDER — ACETAMINOPHEN 325 MG PO TABS: 325.0000 mg | ORAL_TABLET | Freq: Four times a day (QID) | ORAL | Status: DC | PRN

## 2018-02-09 MED ORDER — DOCUSATE SODIUM 100 MG PO CAPS: 100.0000 mg | ORAL_CAPSULE | Freq: Two times a day (BID) | ORAL | 0 refills | Status: DC

## 2018-02-09 MED ORDER — POLYETHYLENE GLYCOL 3350 17 G PO PACK
17.0000 g | PACK | Freq: Two times a day (BID) | ORAL | Status: DC
  Administered 2018-02-09 – 2018-02-10 (×2): 17 g via ORAL
  Filled 2018-02-09 (×2): qty 1

## 2018-02-09 MED ORDER — STERILE WATER FOR IRRIGATION IR SOLN
Status: DC | PRN
  Administered 2018-02-09: 2000 mL

## 2018-02-09 MED ORDER — HYDROCODONE-ACETAMINOPHEN 7.5-325 MG PO TABS: 1.0000 | ORAL_TABLET | ORAL | 0 refills | Status: DC | PRN

## 2018-02-09 MED ORDER — TRANEXAMIC ACID 1000 MG/10ML IV SOLN
1000.0000 mg | INTRAVENOUS | Status: DC
  Filled 2018-02-09: qty 10

## 2018-02-09 MED ORDER — ASPIRIN 81 MG PO CHEW
81.0000 mg | CHEWABLE_TABLET | Freq: Two times a day (BID) | ORAL | Status: DC
  Administered 2018-02-09 – 2018-02-10 (×2): 81 mg via ORAL
  Filled 2018-02-09 (×2): qty 1

## 2018-02-09 MED ORDER — FERROUS SULFATE 325 (65 FE) MG PO TABS
325.0000 mg | ORAL_TABLET | Freq: Three times a day (TID) | ORAL | Status: DC
  Administered 2018-02-10 (×2): 325 mg via ORAL
  Filled 2018-02-09 (×3): qty 1

## 2018-02-09 MED ORDER — MENTHOL 3 MG MT LOZG: 1.0000 | LOZENGE | OROMUCOSAL | Status: DC | PRN

## 2018-02-09 MED ORDER — SODIUM CHLORIDE 0.9 % IR SOLN
Status: DC | PRN
  Administered 2018-02-09: 1000 mL

## 2018-02-09 MED ORDER — METOCLOPRAMIDE HCL 5 MG/ML IJ SOLN
5.0000 mg | Freq: Three times a day (TID) | INTRAMUSCULAR | Status: DC | PRN
  Administered 2018-02-09: 10 mg via INTRAVENOUS
  Filled 2018-02-09: qty 2

## 2018-02-09 MED ORDER — MIDAZOLAM HCL 2 MG/2ML IJ SOLN
INTRAMUSCULAR | Status: AC
  Filled 2018-02-09: qty 2

## 2018-02-09 MED ORDER — DEXAMETHASONE SODIUM PHOSPHATE 10 MG/ML IJ SOLN
10.0000 mg | Freq: Once | INTRAMUSCULAR | Status: AC
  Administered 2018-02-10: 10 mg via INTRAVENOUS
  Filled 2018-02-09: qty 1

## 2018-02-09 MED ORDER — KETAMINE HCL 10 MG/ML IJ SOLN
INTRAMUSCULAR | Status: AC
  Filled 2018-02-09: qty 1

## 2018-02-09 MED ORDER — ASPIRIN 81 MG PO CHEW: 81.0000 mg | CHEWABLE_TABLET | Freq: Two times a day (BID) | ORAL | 0 refills | Status: AC

## 2018-02-09 MED ORDER — ROCURONIUM BROMIDE 100 MG/10ML IV SOLN
INTRAVENOUS | Status: DC | PRN
  Administered 2018-02-09: 10 mg via INTRAVENOUS
  Administered 2018-02-09: 50 mg via INTRAVENOUS
  Administered 2018-02-09 (×3): 10 mg via INTRAVENOUS

## 2018-02-09 MED ORDER — MAGNESIUM CITRATE PO SOLN: 1.0000 | Freq: Once | ORAL | Status: DC | PRN

## 2018-02-09 MED ORDER — FERROUS SULFATE 325 (65 FE) MG PO TABS: 325.0000 mg | ORAL_TABLET | Freq: Three times a day (TID) | ORAL | 3 refills | Status: DC

## 2018-02-09 MED ORDER — METHOCARBAMOL 500 MG PO TABS
500.0000 mg | ORAL_TABLET | Freq: Four times a day (QID) | ORAL | Status: DC | PRN
  Administered 2018-02-10 (×2): 500 mg via ORAL
  Filled 2018-02-09 (×2): qty 1

## 2018-02-09 MED ORDER — DIPHENHYDRAMINE HCL 12.5 MG/5ML PO ELIX: 12.5000 mg | ORAL_SOLUTION | ORAL | Status: DC | PRN

## 2018-02-09 MED ORDER — CEFAZOLIN SODIUM-DEXTROSE 2-4 GM/100ML-% IV SOLN
2.0000 g | Freq: Four times a day (QID) | INTRAVENOUS | Status: AC
  Administered 2018-02-09 (×2): 2 g via INTRAVENOUS
  Filled 2018-02-09 (×2): qty 100

## 2018-02-09 MED ORDER — TRANEXAMIC ACID 1000 MG/10ML IV SOLN
1000.0000 mg | Freq: Once | INTRAVENOUS | Status: AC
  Administered 2018-02-09: 1000 mg via INTRAVENOUS
  Filled 2018-02-09: qty 1000

## 2018-02-09 MED ORDER — METHOCARBAMOL 500 MG IVPB - SIMPLE MED
500.0000 mg | Freq: Four times a day (QID) | INTRAVENOUS | Status: DC | PRN
  Administered 2018-02-09: 500 mg via INTRAVENOUS
  Filled 2018-02-09: qty 50

## 2018-02-09 MED ORDER — TRANEXAMIC ACID 1000 MG/10ML IV SOLN
INTRAVENOUS | Status: AC
  Filled 2018-02-09: qty 10

## 2018-02-09 MED ORDER — PROMETHAZINE HCL 25 MG/ML IJ SOLN: 6.2500 mg | INTRAMUSCULAR | Status: DC | PRN

## 2018-02-09 MED ORDER — ROCURONIUM BROMIDE 100 MG/10ML IV SOLN
INTRAVENOUS | Status: AC
  Filled 2018-02-09: qty 1

## 2018-02-09 MED ORDER — ONDANSETRON HCL 4 MG PO TABS
4.0000 mg | ORAL_TABLET | Freq: Four times a day (QID) | ORAL | Status: DC | PRN
  Administered 2018-02-09: 4 mg via ORAL
  Filled 2018-02-09: qty 1

## 2018-02-09 MED ORDER — FENTANYL CITRATE (PF) 250 MCG/5ML IJ SOLN
INTRAMUSCULAR | Status: AC
  Filled 2018-02-09: qty 5

## 2018-02-09 MED ORDER — DOCUSATE SODIUM 100 MG PO CAPS
100.0000 mg | ORAL_CAPSULE | Freq: Two times a day (BID) | ORAL | Status: DC
  Administered 2018-02-09 – 2018-02-10 (×2): 100 mg via ORAL
  Filled 2018-02-09 (×2): qty 1

## 2018-02-09 MED ORDER — MIDAZOLAM HCL 5 MG/5ML IJ SOLN
INTRAMUSCULAR | Status: DC | PRN
  Administered 2018-02-09: 2 mg via INTRAVENOUS

## 2018-02-09 MED ORDER — ONDANSETRON HCL 4 MG/2ML IJ SOLN
INTRAMUSCULAR | Status: DC | PRN
  Administered 2018-02-09: 4 mg via INTRAVENOUS

## 2018-02-09 MED ORDER — CELECOXIB 200 MG PO CAPS
200.0000 mg | ORAL_CAPSULE | Freq: Two times a day (BID) | ORAL | Status: DC
  Administered 2018-02-09 – 2018-02-10 (×2): 200 mg via ORAL
  Filled 2018-02-09 (×2): qty 1

## 2018-02-09 MED ORDER — METHOCARBAMOL 500 MG IVPB - SIMPLE MED
INTRAVENOUS | Status: AC
  Filled 2018-02-09: qty 50

## 2018-02-09 MED ORDER — HYDROMORPHONE HCL 1 MG/ML IJ SOLN
INTRAMUSCULAR | Status: AC
  Filled 2018-02-09: qty 2

## 2018-02-09 MED ORDER — TIZANIDINE HCL 4 MG PO TABS: 4.0000 mg | ORAL_TABLET | Freq: Three times a day (TID) | ORAL | 0 refills | Status: DC | PRN

## 2018-02-09 MED ORDER — FENTANYL CITRATE (PF) 100 MCG/2ML IJ SOLN
INTRAMUSCULAR | Status: DC | PRN
  Administered 2018-02-09: 100 ug via INTRAVENOUS
  Administered 2018-02-09 (×5): 50 ug via INTRAVENOUS

## 2018-02-09 MED ORDER — LACTATED RINGERS IV SOLN
INTRAVENOUS | Status: DC
  Administered 2018-02-09 (×3): via INTRAVENOUS

## 2018-02-09 MED ORDER — EPHEDRINE SULFATE 50 MG/ML IJ SOLN
INTRAMUSCULAR | Status: DC | PRN
  Administered 2018-02-09 (×2): 5 mg via INTRAVENOUS

## 2018-02-09 MED ORDER — SUGAMMADEX SODIUM 200 MG/2ML IV SOLN
INTRAVENOUS | Status: AC
  Filled 2018-02-09: qty 2

## 2018-02-09 MED ORDER — HYDROCODONE-ACETAMINOPHEN 7.5-325 MG PO TABS: 1.0000 | ORAL_TABLET | ORAL | Status: DC | PRN

## 2018-02-09 MED ORDER — ALBUMIN HUMAN 5 % IV SOLN
INTRAVENOUS | Status: DC | PRN
  Administered 2018-02-09 (×3): via INTRAVENOUS

## 2018-02-09 MED ORDER — DEXAMETHASONE SODIUM PHOSPHATE 10 MG/ML IJ SOLN
10.0000 mg | Freq: Once | INTRAMUSCULAR | Status: AC
  Administered 2018-02-09: 10 mg via INTRAVENOUS

## 2018-02-09 MED ORDER — SUGAMMADEX SODIUM 200 MG/2ML IV SOLN
INTRAVENOUS | Status: DC | PRN
  Administered 2018-02-09: 200 mg via INTRAVENOUS

## 2018-02-09 MED ORDER — PROPOFOL 10 MG/ML IV BOLUS
INTRAVENOUS | Status: DC | PRN
  Administered 2018-02-09: 150 mg via INTRAVENOUS

## 2018-02-09 MED ORDER — BISACODYL 10 MG RE SUPP: 10.0000 mg | Freq: Every day | RECTAL | Status: DC | PRN

## 2018-02-09 MED ORDER — HYDROCODONE-ACETAMINOPHEN 5-325 MG PO TABS
1.0000 | ORAL_TABLET | ORAL | Status: DC | PRN
  Administered 2018-02-09 – 2018-02-10 (×4): 1 via ORAL
  Filled 2018-02-09 (×4): qty 1

## 2018-02-09 MED ORDER — TRANEXAMIC ACID 1000 MG/10ML IV SOLN
1000.0000 mg | INTRAVENOUS | Status: AC
  Administered 2018-02-09 (×2): 1000 mg via INTRAVENOUS
  Filled 2018-02-09: qty 10

## 2018-02-09 MED ORDER — SODIUM CHLORIDE 0.9 % IV SOLN
INTRAVENOUS | Status: DC
  Administered 2018-02-09 (×2): via INTRAVENOUS

## 2018-02-09 SURGICAL SUPPLY — 42 items
BAG ZIPLOCK 12X15 (MISCELLANEOUS) ×3 IMPLANT
BLADE SAG 18X100X1.27 (BLADE) ×3 IMPLANT
COVER PERINEAL POST (MISCELLANEOUS) ×3 IMPLANT
COVER SURGICAL LIGHT HANDLE (MISCELLANEOUS) ×3 IMPLANT
CUP ACET PINNACLE SECTR 56MM (Hips) ×1 IMPLANT
DERMABOND ADVANCED (GAUZE/BANDAGES/DRESSINGS) ×2
DERMABOND ADVANCED .7 DNX12 (GAUZE/BANDAGES/DRESSINGS) ×1 IMPLANT
DRAPE STERI IOBAN 125X83 (DRAPES) ×3 IMPLANT
DRAPE U-SHAPE 47X51 STRL (DRAPES) ×6 IMPLANT
DRESSING AQUACEL AG SP 3.5X10 (GAUZE/BANDAGES/DRESSINGS) ×1 IMPLANT
DRSG AQUACEL AG SP 3.5X10 (GAUZE/BANDAGES/DRESSINGS) ×3
DURAPREP 26ML APPLICATOR (WOUND CARE) ×3 IMPLANT
ELECT REM PT RETURN 15FT ADLT (MISCELLANEOUS) ×3 IMPLANT
ELIMINATOR HOLE APEX DEPUY (Hips) ×3 IMPLANT
GLOVE BIO SURGEON STRL SZ7 (GLOVE) ×3 IMPLANT
GLOVE BIOGEL M STRL SZ7.5 (GLOVE) ×6 IMPLANT
GLOVE BIOGEL PI IND STRL 7.0 (GLOVE) ×1 IMPLANT
GLOVE BIOGEL PI IND STRL 7.5 (GLOVE) ×4 IMPLANT
GLOVE BIOGEL PI IND STRL 9 (GLOVE) ×1 IMPLANT
GLOVE BIOGEL PI INDICATOR 7.0 (GLOVE) ×2
GLOVE BIOGEL PI INDICATOR 7.5 (GLOVE) ×8
GLOVE BIOGEL PI INDICATOR 9 (GLOVE) ×2
GLOVE ECLIPSE 8.5 STRL (GLOVE) ×3 IMPLANT
GLOVE ORTHO TXT STRL SZ7.5 (GLOVE) ×3 IMPLANT
GOWN STRL REUS W/TWL 2XL LVL3 (GOWN DISPOSABLE) ×3 IMPLANT
GOWN STRL REUS W/TWL LRG LVL3 (GOWN DISPOSABLE) ×3 IMPLANT
GOWN STRL REUS W/TWL XL LVL3 (GOWN DISPOSABLE) ×6 IMPLANT
HEAD CERAMIC 36 PLUS5 (Hips) ×3 IMPLANT
HOLDER FOLEY CATH W/STRAP (MISCELLANEOUS) ×3 IMPLANT
PACK ANTERIOR HIP CUSTOM (KITS) ×3 IMPLANT
PINNACLE ALTRX PLUS 4 N 36X56 (Hips) ×3 IMPLANT
PINNACLE SECTOR CUP 56MM (Hips) ×3 IMPLANT
SCREW 6.5MMX25MM (Screw) ×3 IMPLANT
STEM FEM ACTIS HIGH SZ10 (Stem) ×3 IMPLANT
SUT MNCRL AB 4-0 PS2 18 (SUTURE) ×3 IMPLANT
SUT STRATAFIX 0 PDS 27 VIOLET (SUTURE) ×3
SUT VIC AB 1 CT1 36 (SUTURE) ×9 IMPLANT
SUT VIC AB 2-0 CT1 27 (SUTURE) ×4
SUT VIC AB 2-0 CT1 TAPERPNT 27 (SUTURE) ×2 IMPLANT
SUTURE STRATFX 0 PDS 27 VIOLET (SUTURE) ×1 IMPLANT
TRAY FOLEY CATH 16FR SILVER (SET/KITS/TRAYS/PACK) ×3 IMPLANT
YANKAUER SUCT BULB TIP 10FT TU (MISCELLANEOUS) ×3 IMPLANT

## 2018-02-09 NOTE — Evaluation (Signed)
Physical Therapy Evaluation Patient Details Name: Bryan [name] MRN: [id number] DOB: [birth year] Today's Date: 02/09/2018   History of Present Illness  Pt is a 72 YO male s/p R DA-THA on 02/09/18. PMH includes lumbar stenosis with neurogenic claudication 2019, bakc pain, HTN, OA, anxiety, depression, GERD. Surgical history includes achilles tendon surgery 2007, anterior lateral lumbar fusion L2-L5, knee surgery x2, THA.   Clinical Impression   Pt s/p R DA-THA. Pt presents with difficulty performing mobility, decreased tolerance for activity, and orthostatic hypotension upon standing. Pt with supine BP of 135/76 and pulse of 82 bpm, dropped to 92/63 and pulse of 115 bpm. Pt with nausea and feeling hot immediately after standing, no further mobility attempted. Will continue to progress mobility as able. Pt with d/c plan as listed below. Will continue to follow acutely.     Follow Up Recommendations Follow surgeon's recommendation for DC plan and follow-up therapies;Supervision for mobility/OOB(HHPT )    Equipment Recommendations  None recommended by PT    Recommendations for Other Services       Precautions / Restrictions Precautions Precautions: None Restrictions Weight Bearing Restrictions: No Other Position/Activity Restrictions: WBAT       Mobility  Bed Mobility Overal bed mobility: Needs Assistance Bed Mobility: Supine to Sit;Sit to Supine     Supine to sit: Min assist Sit to supine: Mod assist   General bed mobility comments: Min assist for supine to sit for RLE management and scooting to EOB. Mod assist for returning to bed after orthostatic hypotension for bilateral LE management, trunk lowering, and scooting up in bed.   Transfers Overall transfer level: Needs assistance Equipment used: Rolling walker (2 wheeled) Transfers: Sit to/from Stand Sit to Stand: Min assist         General transfer comment: Min assist for steadying upon standing, increased time and  effort to stand. Upon standing, pt reported feeling nauseous and hot. BP at 92/63, immediately returned to bed with mod assist. After approximately 2 minutes, nausea and heat passed.   Ambulation/Gait                Stairs            Wheelchair Mobility    Modified Rankin (Stroke Patients Only)       Balance Overall balance assessment: Needs assistance Sitting-balance support: Feet supported Sitting balance-Leahy Scale: Fair Sitting balance - Comments: used UEs for scooting to EOB.    Standing balance support: Bilateral upper extremity supported Standing balance-Leahy Scale: Poor Standing balance comment: use of RW for steadying and support in standing.                              Pertinent Vitals/Pain Pain Assessment: 0-10 Pain Score: 2  Pain Location: R hip  Pain Descriptors / Indicators: Grimacing;Operative site guarding;Sore Pain Intervention(s): Limited activity within patient's tolerance;Repositioned;Monitored during session    [place] expects to be discharged to:: Private residence Living Arrangements: Spouse/significant other Available Help at Discharge: Available PRN/intermittently;Friend(s) Type of Home: House Home Access: Stairs to enter Entrance Stairs-Rails: None Entrance Stairs-Number of Steps: 5 Home Layout: One level Home Equipment: Environmental consultant - 2 wheels;Cane - single point      Prior Function Level of Independence: Independent with assistive device(s)         Comments: used cane for ambulation      Hand Dominance   Dominant Hand: Right    Extremity/Trunk Assessment  Upper Extremity Assessment Upper Extremity Assessment: Overall WFL for tasks assessed    Lower Extremity Assessment Lower Extremity Assessment: RLE deficits/detail;Overall WFL for tasks assessed RLE Deficits / Details: suspected post-surgical hip weakness, able to perform strong quad set and ankle pumps bilat. Performed SLR x1 when  moving to EOB.  RLE: Unable to fully assess due to pain RLE Sensation: WNL    Cervical / Trunk Assessment Cervical / Trunk Assessment: Normal  Communication   Communication: No difficulties  Cognition Arousal/Alertness: Awake/alert Behavior During Therapy: WFL for tasks assessed/performed Overall Cognitive Status: Within Functional Limits for tasks assessed                                        General Comments      Exercises Total Joint Exercises Ankle Circles/Pumps: AROM;10 reps;Supine;Both Quad Sets: AROM;Right;5 reps;Supine   Assessment/Plan    PT Assessment Patient needs continued PT services  PT Problem List Decreased strength;Pain;Decreased activity tolerance;Decreased balance;Decreased mobility;Decreased knowledge of use of DME       PT Treatment Interventions DME instruction;Therapeutic activities;Gait training;Therapeutic exercise;Patient/family education;Stair training;Balance training;Functional mobility training    PT Goals (Current goals can be found in the Care Plan section)  Acute Rehab PT Goals Patient Stated Goal: go home  PT Goal Formulation: With patient Time For Goal Achievement: 02/23/18 Potential to Achieve Goals: Good    Frequency 7X/week   Barriers to discharge        Co-evaluation               AM-PAC PT "6 Clicks" Daily Activity  Outcome Measure Difficulty turning over in bed (including adjusting bedclothes, sheets and blankets)?: Unable Difficulty moving from lying on back to sitting on the side of the bed? : Unable Difficulty sitting down on and standing up from a chair with arms (e.g., wheelchair, bedside commode, etc,.)?: Unable Help needed moving to and from a bed to chair (including a wheelchair)?: A Little Help needed walking in hospital room?: A Little Help needed climbing 3-5 steps with a railing? : A Lot 6 Click Score: 11    End of Session Equipment Utilized During Treatment: Gait belt(O2 reapplied  after session ) Activity Tolerance: Treatment limited secondary to medical complications (Comment)(orthostatic hypotension upon standing) Patient left: in bed;with SCD's reapplied;with family/visitor present;with call bell/phone within reach Nurse Communication: Mobility status;Other (comment)(orthostatic event ) PT Visit Diagnosis: Difficulty in walking, not elsewhere classified (R26.2);Muscle weakness (generalized) (M62.81)    Time: 1716-1750 PT Time Calculation (min) (ACUTE ONLY): 34 min   Charges:   PT Evaluation $PT Eval Low Complexity: 1 Low PT Treatments $Therapeutic Activity: 8-22 mins       [place] Keena Conception Chancy, PT, DPT  Pager # [phone]    Yanisa Goodgame D Whitleigh Garramone 02/09/2018, 6:15 PM

## 2018-02-09 NOTE — Interval H&P Note (Signed)
History and Physical Interval Note:  02/09/2018 7:08 AM  Bryan [name]  has presented today for surgery, with the diagnosis of Right hip osteoarthritis  The various methods of treatment have been discussed with the patient and family. After consideration of risks, benefits and other options for treatment, the patient has consented to  Procedure(s) with comments: RIGHT TOTAL HIP ARTHROPLASTY ANTERIOR APPROACH (Right) - 70 mins as a surgical intervention .  The patient's history has been reviewed, patient examined, no change in status, stable for surgery.  I have reviewed the patient's chart and labs.  Questions were answered to the patient's satisfaction.     Mauri Pole

## 2018-02-09 NOTE — Anesthesia Procedure Notes (Signed)
Procedure Name: Intubation Date/Time: 02/09/2018 8:53 AM Performed by: Glory Buff, CRNA Pre-anesthesia Checklist: Patient identified, Emergency Drugs available, Suction available and Patient being monitored Patient Re-evaluated:Patient Re-evaluated prior to induction Oxygen Delivery Method: Circle system utilized Preoxygenation: Pre-oxygenation with 100% oxygen Induction Type: IV induction Ventilation: Mask ventilation without difficulty Laryngoscope Size: Miller and 3 Grade View: Grade I Tube type: Oral Tube size: 7.5 mm Number of attempts: 1 Airway Equipment and Method: Stylet and Oral airway Placement Confirmation: ETT inserted through vocal cords under direct vision,  positive ETCO2 and breath sounds checked- equal and bilateral Secured at: 22 cm Tube secured with: Tape Dental Injury: Teeth and Oropharynx as per pre-operative assessment

## 2018-02-09 NOTE — Discharge Instructions (Signed)

## 2018-02-09 NOTE — Transfer of Care (Signed)
Immediate Anesthesia Transfer of Care Note  Patient: Bryan [name]  Procedure(s) Performed: RIGHT TOTAL HIP ARTHROPLASTY ANTERIOR APPROACH (Right Hip)  Patient Location: PACU  Anesthesia Type:General  Level of Consciousness: awake, alert  and oriented  Airway & Oxygen Therapy: Patient Spontanous Breathing and Patient connected to face mask oxygen  Post-op Assessment: Report given to RN and Post -op Vital signs reviewed and stable  Post vital signs: Reviewed and stable  Last Vitals:  Vitals Value Taken Time  BP 154/95 02/09/2018 11:02 AM  Temp    Pulse 96 02/09/2018 11:04 AM  Resp 19 02/09/2018 11:04 AM  SpO2 100 % 02/09/2018 11:04 AM  Vitals shown include unvalidated device data.  Last Pain:  Vitals:   02/09/18 0634  TempSrc:   PainSc: 3          Complications: No apparent anesthesia complications

## 2018-02-09 NOTE — Op Note (Signed)
NAME:  Bryan [name]                ACCOUNT NO.: [id number]      MEDICAL RECORD NO.: [id number]      FACILITY:  [facility]      PHYSICIAN:  Mauri Pole  DATE OF BIRTH:  03/26/1946     DATE OF PROCEDURE:  02/09/2018                                 OPERATIVE REPORT         PREOPERATIVE DIAGNOSIS: Right  hip osteoarthritis.      POSTOPERATIVE DIAGNOSIS:  Right hip osteoarthritis.      PROCEDURE:  Right total hip replacement through an anterior approach   utilizing DePuy THR system, component size 1mm pinnacle cup, a size 36+4 neutral   Altrex liner, a size 10 Hi Actis stem with a 36+5 delta ceramic   ball.      SURGEON:  Pietro Cassis. Alvan Dame, M.D.      ASSISTANT:  Nehemiah Massed, PA-C     ANESTHESIA:  General.      SPECIMENS:  None.      COMPLICATIONS:  None.      BLOOD LOSS:  2200 cc     DRAINS:  None.      INDICATION OF THE PROCEDURE:  Bryan [name] is a 72 y.o. male who had   presented to office for evaluation of right hip pain.  Radiographs revealed   progressive degenerative changes with bone-on-bone   articulation of the  hip joint, including subchondral cystic changes and osteophytes.  There had even been interval fracture of his femoral head in the epiphyseal area.  The patient had painful limited range of   motion significantly affecting their overall quality of life and function.  The patient was failing to    respond to conservative measures including medications and/or injections and activity modification and at this point was ready   to proceed with more definitive measures.  Consent was obtained for   benefit of pain relief.  Specific risks of infection, DVT, component   failure, dislocation, neurovascular injury, and need for revision surgery were reviewed in the office as well discussion of   the anterior versus posterior approach were reviewed.     PROCEDURE IN DETAIL:  The patient was brought to operative theater.   Once adequate  anesthesia, preoperative antibiotics, 2 gm of Ancef, 1 gm of Tranexamic Acid, and 10 mg of Decadron were administered, the patient was positioned supine on the [employer] table.  Once the patient was safely positioned with adequate padding of boney prominences we predraped out the hip, and used fluoroscopy to confirm orientation of the pelvis.      The right hip was then prepped and draped from proximal iliac crest to   mid thigh with a shower curtain technique.      Time-out was performed identifying the patient, planned procedure, and the appropriate extremity.     An incision was then made 2 cm lateral to the   anterior superior iliac spine extending over the orientation of the   tensor fascia lata muscle and sharp dissection was carried down to the   fascia of the muscle.      The fascia was then incised.  The muscle belly was identified and swept   laterally and retractor placed along the  superior neck.  Following   cauterization of the circumflex vessels and removing some pericapsular   fat, a second cobra retractor was placed on the inferior neck.  A T-capsulotomy was made along the line of the   superior neck to the trochanteric fossa, then extended proximally and   distally.  Tag sutures were placed and the retractors were then placed   intracapsular.  We then identified the trochanteric fossa and   orientation of my neck cut and then made a neck osteotomy with the femur on traction.  The femoral   head was removed without difficulty or complication.  Traction was let   off and retractors were placed posterior and anterior around the   acetabulum.      The labrum and foveal tissue were debrided.  I began reaming with a 46 mm   reamer and reamed up to 55 mm reamer with good bony bed preparation and a 56 mm  cup was chosen.  The final 56 mm Pinnacle cup was then impacted under fluoroscopy to confirm the depth of penetration and orientation with respect to   Abduction and forward  flexion.  A screw was placed into the ilium followed by the hole eliminator.  The final   36+4 neutral Altrex liner was impacted with good visualized rim fit.  The cup was positioned anatomically within the acetabular portion of the pelvis.      At this point, the femur was rolled to 100 degrees.  Further capsule was   released off the inferior aspect of the femoral neck.  I then   released the superior capsule proximally.  With the leg in a neutral position the hook was placed laterally   along the femur under the vastus lateralis origin and elevated manually and then held in position using the hook attachment on the bed.  The leg was then extended and adducted with the leg rolled to 100   degrees of external rotation.  Retractors were placed along the medial calcar and posteriorly over the greater trochanter.  Once the proximal femur was fully   exposed, I used a box osteotome to set orientation.  I then began   broaching with the starting chili pepper broach and passed this by hand and then broached up to the 10.  With the 10 broach in place I chose a high offset neck and did several trial reductions.  The offset was appropriate, leg lengths   appeared to be equal best matched with the +5 head ball trial confirmed radiographically.   Given these findings, I went ahead and dislocated the hip, repositioned all   retractors and positioned the right hip in the extended and abducted position.  He was noted to have significant bleeding from inside the femoral canal, even when the broaches were in place.  For that reason I decided to give him another 1 gm of Tranexamic Acid to see if this would impact further blood loss.  He started the case with hemoglobin of 15 and remain hemodynamically stable through out case. The final 10 Hi Actis stem was   chosen and it was impacted down to the level of neck cut.  Based on this   and the trial reductions, a final 36+5 delta ceramic ball was chosen and   impacted  onto a clean and dry trunnion, and the hip was reduced.  The   hip had been irrigated throughout the case again at this point.  I did   reapproximate the  superior capsular leaflet to the anterior leaflet   using #1 Vicryl.  The fascia of the   tensor fascia lata muscle was then reapproximated using #1 Vicryl and #0 Stratafix sutures.  The   remaining wound was closed with 2-0 Vicryl and running 4-0 Monocryl.   The hip was cleaned, dried, and dressed sterilely using Dermabond and   Aquacel dressing.  The patient was then brought   to recovery room in stable condition tolerating the procedure well.    Nehemiah Massed, PA-C was present for the entirety of the case involved from   preoperative positioning, perioperative retractor management, general   facilitation of the case, as well as primary wound closure as assistant.            Pietro Cassis Alvan Dame, M.D.        02/09/2018 10:25 AM

## 2018-02-09 NOTE — Anesthesia Postprocedure Evaluation (Signed)
Anesthesia Post Note  Patient: Bryan [name]  Procedure(s) Performed: RIGHT TOTAL HIP ARTHROPLASTY ANTERIOR APPROACH (Right Hip)     Patient location during evaluation: PACU Anesthesia Type: General Level of consciousness: awake and alert Pain management: pain level controlled Vital Signs Assessment: post-procedure vital signs reviewed and stable Respiratory status: spontaneous breathing, nonlabored ventilation, respiratory function stable and patient connected to nasal cannula oxygen Cardiovascular status: blood pressure returned to baseline and stable Postop Assessment: no apparent nausea or vomiting Anesthetic complications: no    Last Vitals:  Vitals:   02/09/18 1424 02/09/18 1516  BP: 136/83 133/70  Pulse: 81 79  Resp: 18   Temp:  36.7 C  SpO2:  100%    Last Pain:  Vitals:   02/09/18 1516  TempSrc: Oral  PainSc:                  Tiajuana Amass

## 2018-02-09 NOTE — Plan of Care (Signed)

## 2018-02-10 ENCOUNTER — Encounter (HOSPITAL_COMMUNITY): Payer: Self-pay | Admitting: Orthopedic Surgery

## 2018-02-10 LAB — BASIC METABOLIC PANEL
Anion gap: 7 (ref 5–15)
BUN: 22 mg/dL (ref 8–23)
CALCIUM: 9 mg/dL (ref 8.9–10.3)
CHLORIDE: 106 mmol/L (ref 98–111)
CO2: 27 mmol/L (ref 22–32)
CREATININE: 0.93 mg/dL (ref 0.61–1.24)
GFR calc Af Amer: >60 mL/min (ref >60)
GFR calc non Af Amer: >60 mL/min (ref >60)
GLUCOSE: 134 mg/dL — AB (ref 70–99)
Potassium: 4.5 mmol/L (ref 3.5–5.1)
Sodium: 140 mmol/L (ref 135–145)

## 2018-02-10 LAB — CBC
HEMATOCRIT: 28.8 % — AB (ref 39.0–52.0)
HEMOGLOBIN: 9.4 g/dL — AB (ref 13.0–17.0)
MCH: 29.9 pg (ref 26.0–34.0)
MCHC: 32.6 g/dL (ref 30.0–36.0)
MCV: 91.7 fL (ref 78.0–100.0)
Platelets: 178 10*3/uL (ref 150–400)
RBC: 3.14 MIL/uL — ABNORMAL LOW (ref 4.22–5.81)
RDW: 12.9 % (ref 11.5–15.5)
WBC: 11.8 10*3/uL — ABNORMAL HIGH (ref 4.0–10.5)

## 2018-02-10 MED ORDER — SODIUM CHLORIDE 0.9 % IV BOLUS
250.0000 mL | Freq: Once | INTRAVENOUS | Status: AC
  Administered 2018-02-10: 250 mL via INTRAVENOUS

## 2018-02-10 NOTE — Progress Notes (Signed)
     Subjective: 1 Day Post-Op Procedure(s) (LRB): RIGHT TOTAL HIP ARTHROPLASTY ANTERIOR APPROACH (Right)   Patient reports pain as mild, pain controlled. Felt lightheaded yesterday when they got him up, otherwise no events.  Already feels different/better than he did prior to surgery.  Discussed his home activities to improve as well as discussed swimming and driving.  Ready to be discharged home if he lightheadedness and does well with PT.       Objective:   VITALS:   Vitals:   02/10/18 0229 02/10/18 0542  BP: 125/78 130/72  Pulse: 89 74  Resp: 17 15  Temp: 98.5 F (36.9 C) 98.5 F (36.9 C)  SpO2: 99% 100%    Dorsiflexion/Plantar flexion intact Incision: dressing C/D/I No cellulitis present Compartment soft  LABS Recent Labs    02/10/18 0453  HGB 9.4*  HCT 28.8*  WBC 11.8*  PLT 178    Recent Labs    02/10/18 0453  NA 140  K 4.5  BUN 22  CREATININE 0.93  GLUCOSE 134*     Assessment/Plan: 1 Day Post-Op Procedure(s) (LRB): RIGHT TOTAL HIP ARTHROPLASTY ANTERIOR APPROACH (Right) 250cc NaCl bolus Foley cath d/c'ed Advance diet Up with therapy D/C IV fluids Discharge home Follow up in 2 weeks at [facility] ([place]). Follow up with OLIN,Meriel Kelliher D in 2 weeks.  Contact information:  [facility]) [address], [address] [place]. Yanni Quiroa   PAC  02/10/2018, 8:38 AM

## 2018-02-10 NOTE — Progress Notes (Signed)
Physical Therapy Treatment Patient Details Name: Bryan [name] MRN: [id number] DOB: 10-27-1945 Today's Date: 02/10/2018    History of Present Illness Pt is a 72 YO male s/p R DA-THA on 02/09/18. PMH includes lumbar stenosis with neurogenic claudication 2019, back pain, HTN, OA, anxiety, depression, GERD. Surgical history includes achilles tendon surgery 2007, anterior lateral lumbar fusion L2-L5, knee surgery x2, THA.     PT Comments    Pt ambulated in hallway again and practiced safe stair technique with spouse assisting.  Pt also provided with HEP handout.  Pt and spouse had no further questions and pt ready for d/c home.   Follow Up Recommendations  Follow surgeon's recommendation for DC plan and follow-up therapies;Supervision for mobility/OOB     Equipment Recommendations  None recommended by PT    Recommendations for Other Services       Precautions / Restrictions Precautions Precautions: None Restrictions Other Position/Activity Restrictions: WBAT     Mobility  Bed Mobility Overal bed mobility: Needs Assistance Bed Mobility: Supine to Sit;Sit to Supine     Supine to sit: Supervision Sit to supine: Supervision   General bed mobility comments: pt self assisted R LE   Transfers Overall transfer level: Needs assistance Equipment used: Rolling walker (2 wheeled) Transfers: Sit to/from Stand Sit to Stand: Supervision         General transfer comment: verbal cues for UE and LE positioning  Ambulation/Gait Ambulation/Gait assistance: Supervision;Min guard Gait Distance (Feet): 160 Feet Assistive device: Rolling walker (2 wheeled) Gait Pattern/deviations: Step-through pattern;Decreased stance time - right;Antalgic     General Gait Details: verbal cues for RW positioning (also performed 80 feet with SPC, reports pain controlled), encouraged to use RW for healing at this time   Stairs Stairs: Yes Stairs assistance: Min guard Stair Management: Step to  pattern;Backwards;With walker Number of Stairs: 2 General stair comments: verbal cues for safety, sequence, RW positioning, spouse assisted with holding RW; performed twice; pt and spouse report understanding   Wheelchair Mobility    Modified Rankin (Stroke Patients Only)       Balance                                            Cognition Arousal/Alertness: Awake/alert Behavior During Therapy: WFL for tasks assessed/performed Overall Cognitive Status: Within Functional Limits for tasks assessed                                           General Comments        Pertinent Vitals/Pain Pain Assessment: 0-10 Pain Score: 3  Pain Location: R hip  Pain Descriptors / Indicators: Grimacing;Operative site guarding;Sore Pain Intervention(s): Limited activity within patient's tolerance;Repositioned;Monitored during session    Home Living                      Prior Function            PT Goals (current goals can now be found in the care plan section) Progress towards PT goals: Progressing toward goals    Frequency    7X/week      PT Plan Current plan remains appropriate    Co-evaluation              AM-PAC PT "6  Clicks" Daily Activity  Outcome Measure  Difficulty turning over in bed (including adjusting bedclothes, sheets and blankets)?: A Little Difficulty moving from lying on back to sitting on the side of the bed? : A Little Difficulty sitting down on and standing up from a chair with arms ([name].g., wheelchair, bedside commode, etc,.)?: A Little Help needed moving to and from a bed to chair (including a wheelchair)?: A Little Help needed walking in hospital room?: A Little Help needed climbing 3-5 steps with a railing? : A Little 6 Click Score: 18    End of Session Equipment Utilized During Treatment: Gait belt Activity Tolerance: Patient tolerated treatment well Patient left: in bed;with call bell/phone within  reach;with family/visitor present   PT Visit Diagnosis: Difficulty in walking, not elsewhere classified (R26.2);Muscle weakness (generalized) (M62.81)     Time: 0141-0301 PT Time Calculation (min) (ACUTE ONLY): 12 min  Charges:  $Gait Training: 8-22 mins                      Carmelia Bake, PT, DPT 02/10/2018 Pager: [phone]  Bryan [name] 02/10/2018, 3:56 PM

## 2018-02-10 NOTE — Progress Notes (Signed)
Physical Therapy Treatment Patient Details Name: Bryan [name] MRN: [id number] DOB: 05-26-1946 Today's Date: 02/10/2018    History of Present Illness Pt is a 72 YO male s/p R DA-THA on 02/09/18. PMH includes lumbar stenosis with neurogenic claudication 2019, back pain, HTN, OA, anxiety, depression, GERD. Surgical history includes achilles tendon surgery 2007, anterior lateral lumbar fusion L2-L5, knee surgery x2, THA.     PT Comments    Pt ambulated in hallway and performed LE exercises.  Will return to practice steps prior to d/c.  Follow Up Recommendations  Follow surgeon's recommendation for DC plan and follow-up therapies;Supervision for mobility/OOB     Equipment Recommendations  None recommended by PT    Recommendations for Other Services       Precautions / Restrictions Precautions Precautions: None Restrictions Other Position/Activity Restrictions: WBAT     Mobility  Bed Mobility Overal bed mobility: Needs Assistance Bed Mobility: Supine to Sit;Sit to Supine     Supine to sit: Supervision;HOB elevated Sit to supine: Supervision   General bed mobility comments: pt self assisted R LE   Transfers Overall transfer level: Needs assistance Equipment used: Rolling walker (2 wheeled) Transfers: Sit to/from Stand Sit to Stand: Min guard         General transfer comment: verbal cues for UE and LE positioning  Ambulation/Gait Ambulation/Gait assistance: Min guard Gait Distance (Feet): 320 Feet Assistive device: Rolling walker (2 wheeled) Gait Pattern/deviations: Step-through pattern;Decreased stance time - right;Antalgic     General Gait Details: verbal cues for heel strike, step length, RW positioning   Stairs             Wheelchair Mobility    Modified Rankin (Stroke Patients Only)       Balance                                            Cognition Arousal/Alertness: Awake/alert Behavior During Therapy: WFL for tasks  assessed/performed Overall Cognitive Status: Within Functional Limits for tasks assessed                                        Exercises Total Joint Exercises Ankle Circles/Pumps: AROM;10 reps;Supine;Both Quad Sets: AROM;Right;Supine;10 reps Short Arc Quad: AROM;10 reps;Right Heel Slides: AAROM;10 reps;Right Hip ABduction/ADduction: AROM;10 reps;Right Long Arc Quad: AROM;10 reps;Right;Seated    General Comments        Pertinent Vitals/Pain Pain Assessment: 0-10 Pain Score: 2  Pain Location: R hip  Pain Descriptors / Indicators: Grimacing;Operative site guarding;Sore Pain Intervention(s): Repositioned;Limited activity within patient's tolerance;Monitored during session;Premedicated before session    Home Living                      Prior Function            PT Goals (current goals can now be found in the care plan section) Progress towards PT goals: Progressing toward goals    Frequency    7X/week      PT Plan Current plan remains appropriate    Co-evaluation              AM-PAC PT "6 Clicks" Daily Activity  Outcome Measure  Difficulty turning over in bed (including adjusting bedclothes, sheets and blankets)?: A Lot Difficulty moving from lying on back  to sitting on the side of the bed? : A Lot Difficulty sitting down on and standing up from a chair with arms (e.g., wheelchair, bedside commode, etc,.)?: Unable Help needed moving to and from a bed to chair (including a wheelchair)?: A Little Help needed walking in hospital room?: A Little Help needed climbing 3-5 steps with a railing? : A Little 6 Click Score: 14    End of Session Equipment Utilized During Treatment: Gait belt Activity Tolerance: Patient tolerated treatment well Patient left: in bed;with call bell/phone within reach;with family/visitor present   PT Visit Diagnosis: Difficulty in walking, not elsewhere classified (R26.2);Muscle weakness (generalized)  (M62.81)     Time: 5732-2025 PT Time Calculation (min) (ACUTE ONLY): 24 min  Charges:  $Gait Training: 8-22 mins $Therapeutic Exercise: 8-22 mins                     Carmelia Bake, PT, DPT 02/10/2018 Pager: [phone]  York Ram E 02/10/2018, 3:52 PM

## 2018-02-17 NOTE — Discharge Summary (Signed)
Physician Discharge Summary  Patient ID: NICKOLI BAGHERI MRN: [id number] DOB/AGE: [birth year] 72 y.o.  Admit date: 02/09/2018 Discharge date: 02/10/2018   Procedures:  Procedure(s) (LRB): RIGHT TOTAL HIP ARTHROPLASTY ANTERIOR APPROACH (Right)  Attending Physician:  Dr. Paralee Cancel   Admission Diagnoses:   Right hip primary OA / pain  Discharge Diagnoses:  Principal Problem:   S/P right THA, AA Active Problems:   S/P hip replacement  Past Medical History:  Diagnosis Date  . Anxiety   . Cancer ([place])    facial skin cancer  . Depression   . GERD (gastroesophageal reflux disease)    takes tums rarely  . Hypertension   . Osteoarthritis   . Spinal stenosis     HPI:    Bryan [name], 72 y.o. male, has a history of pain and functional disability in the right hip(s) due to arthritis and patient has failed non-surgical conservative treatments for greater than 12 weeks to include NSAID's and/or analgesics, use of assistive devices and activity modification.  Onset of symptoms was gradual starting <1 year ago with rapidlly worsening course since that time.The patient noted no past surgery on the right hip(s).  Patient currently rates pain in the right hip at 10 out of 10 with activity. Patient has night pain, worsening of pain with activity and weight bearing, trendelenberg gait, pain that interfers with activities of daily living and pain with passive range of motion. Patient has evidence of periarticular osteophytes and joint space narrowing by imaging studies. This condition presents safety issues increasing the risk of falls.  There is no current active infection.  Risks, benefits and expectations were discussed with the patient.  Risks including but not limited to the risk of anesthesia, blood clots, nerve damage, blood vessel damage, failure of the prosthesis, infection and up to and including death.  Patient understand the risks, benefits and expectations and wishes to proceed with  surgery.   PCP: Marletta Lor, MD   Discharged Condition: good  Hospital Course:  Patient underwent the above stated procedure on 02/09/2018. Patient tolerated the procedure well and brought to the recovery room in good condition and subsequently to the floor.  POD #1 BP: 130/72 ; Pulse: 74 ; Temp: 98.5 F (36.9 C) ; Resp: 15 Patient reports pain as mild, pain controlled. Felt lightheaded yesterday when they got him up, otherwise no events.  Already feels different/better than he did prior to surgery.  Discussed his home activities to improve as well as discussed swimming and driving. Ready to be discharged home. Dorsiflexion/plantar flexion intact, incision: dressing C/D/I, no cellulitis present and compartment soft.   LABS  Basename    HGB     9.4  HCT     28.8    Discharge Exam: General appearance: alert, cooperative and no distress Extremities: Homans sign is negative, no sign of DVT, no edema, redness or tenderness in the calves or thighs and no ulcers, gangrene or trophic changes  Disposition:  Home with follow up in 2 weeks   Follow-up Information    Paralee Cancel, MD. Schedule an appointment as soon as possible for a visit in 2 weeks.   Specialty:  Orthopedic Surgery Contact information: [address] [place] [address] [phone]           Discharge Instructions    Call MD / Call 911   Complete by:  As directed    If you experience chest pain or shortness of breath, CALL 911  and be transported to the hospital emergency room.  If you develope a fever above 101 F, pus (white drainage) or increased drainage or redness at the wound, or calf pain, call your surgeon's office.   Change dressing   Complete by:  As directed    Maintain surgical dressing until follow up in the clinic. If the edges start to pull up, may reinforce with tape. If the dressing is no longer working, may remove and cover with gauze and tape, but must keep the area dry  and clean.  Call with any questions or concerns.   Constipation Prevention   Complete by:  As directed    Drink plenty of fluids.  Prune juice may be helpful.  You may use a stool softener, such as Colace (over the counter) 100 mg twice a day.  Use MiraLax (over the counter) for constipation as needed.   Diet - low sodium heart healthy   Complete by:  As directed    Discharge instructions   Complete by:  As directed    Maintain surgical dressing until follow up in the clinic. If the edges start to pull up, may reinforce with tape. If the dressing is no longer working, may remove and cover with gauze and tape, but must keep the area dry and clean.  Follow up in 2 weeks at [facility]. Call with any questions or concerns.   Increase activity slowly as tolerated   Complete by:  As directed    Weight bearing as tolerated with assist device (walker, cane, etc) as directed, use it as long as suggested by your surgeon or therapist, typically at least 4-6 weeks.   TED hose   Complete by:  As directed    Use stockings (TED hose) for 2 weeks on both leg(s).  You may remove them at night for sleeping.      Allergies as of 02/10/2018      Reactions   Codeine Other (See Comments)   "made me sick"      Medication List    STOP taking these medications   HYDROcodone-acetaminophen 5-325 MG tablet Commonly known as:  NORCO/VICODIN Replaced by:  HYDROcodone-acetaminophen 7.5-325 MG tablet   meloxicam 15 MG tablet Commonly known as:  MOBIC   OVER THE COUNTER MEDICATION   oxyCODONE-acetaminophen 5-325 MG tablet Commonly known as:  PERCOCET/ROXICET     TAKE these medications   aspirin 81 MG chewable tablet Chew 1 tablet (81 mg total) by mouth 2 (two) times daily. Take for 4 weeks, then resume regular dose.   docusate sodium 100 MG capsule Commonly known as:  COLACE Take 1 capsule (100 mg total) by mouth 2 (two) times daily.   ferrous sulfate 325 (65 FE) MG tablet Take 1 tablet  (325 mg total) by mouth 3 (three) times daily with meals.   HYDROcodone-acetaminophen 7.5-325 MG tablet Commonly known as:  NORCO Take 1-2 tablets by mouth every 4 (four) hours as needed for moderate pain. Replaces:  HYDROcodone-acetaminophen 5-325 MG tablet   lisinopril-hydrochlorothiazide 20-25 MG tablet Commonly known as:  PRINZIDE,ZESTORETIC Take 1 tablet by mouth daily.   polyethylene glycol packet Commonly known as:  MIRALAX / GLYCOLAX Take 17 g by mouth 2 (two) times daily.   tiZANidine 4 MG tablet Commonly known as:  ZANAFLEX Take 1 tablet (4 mg total) by mouth 3 (three) times daily as needed for muscle spasms.            Discharge Care Instructions  (From admission,  onward)         Start     Ordered   02/10/18 0000  Change dressing    Comments:  Maintain surgical dressing until follow up in the clinic. If the edges start to pull up, may reinforce with tape. If the dressing is no longer working, may remove and cover with gauze and tape, but must keep the area dry and clean.  Call with any questions or concerns.   02/10/18 0845           Signed: [place]. Lorriane Dehart   PA-C  02/17/2018, 3:19 PM

## 2018-03-18 DIAGNOSIS — R972 Elevated prostate specific antigen [PSA]: Secondary | ICD-10-CM | POA: Diagnosis not present

## 2018-03-23 ENCOUNTER — Ambulatory Visit (INDEPENDENT_AMBULATORY_CARE_PROVIDER_SITE_OTHER): Payer: PPO

## 2018-03-23 DIAGNOSIS — R972 Elevated prostate specific antigen [PSA]: Secondary | ICD-10-CM | POA: Diagnosis not present

## 2018-03-23 DIAGNOSIS — Z23 Encounter for immunization: Secondary | ICD-10-CM

## 2018-03-23 DIAGNOSIS — R310 Gross hematuria: Secondary | ICD-10-CM | POA: Diagnosis not present

## 2018-03-25 DIAGNOSIS — Z4732 Aftercare following explantation of hip joint prosthesis: Secondary | ICD-10-CM | POA: Diagnosis not present

## 2018-03-30 ENCOUNTER — Other Ambulatory Visit: Payer: Self-pay

## 2018-03-30 ENCOUNTER — Telehealth: Payer: Self-pay | Admitting: *Deleted

## 2018-03-30 MED ORDER — MELOXICAM 15 MG PO TABS: 15.0000 mg | ORAL_TABLET | Freq: Every day | ORAL | 0 refills | Status: DC

## 2018-03-30 NOTE — Telephone Encounter (Signed)
Copied from [place] [id number]. Topic: General - Inquiry >> Mar 30, 2018  9:25 AM Sheran Luz wrote: Reason for CRM: Ronalee Belts from [facility] calling to inquire as to which provider can fill pts medication, as pt was seeing Dr. Raliegh Ip and pharmacy cannot send request.  Julio Sicks that pt would most likely need appointment. Pt has not seen another provider recently.  Patient does not have an establish appointment.

## 2018-03-30 NOTE — Telephone Encounter (Signed)
Spoke to pt to advise him to find a new PCP quickly so that he will be covered for further refills. Pt verbalized understanding. No further action is needed! Rx has been refilled per orders of Dr.Todd.

## 2018-04-05 DIAGNOSIS — N200 Calculus of kidney: Secondary | ICD-10-CM | POA: Diagnosis not present

## 2018-04-05 DIAGNOSIS — R310 Gross hematuria: Secondary | ICD-10-CM | POA: Diagnosis not present

## 2018-04-10 NOTE — Progress Notes (Signed)
Bryan [name] DOB: Oct 17, 1945 Encounter date: 04/12/2018  This is a 72 y.o. male who presents to establish care. Chief Complaint  Patient presents with  . Transitions Of Care    no new concerns    History of present illness:  HTN: on lisinopril-hctz 20-25 daily: Not checking at home regularly at this point. Did after surgery. Lost a lot of blood during hip surgery which caused some weakness, nausea. Now feeling like strength has come back some.   Arthritis/Back pain with recent right hip replacement and hx of lumbar stenosis w neurogenic claudication: (hydrocodone last filled in September by Dr. Roselee Culver). Has been busy with back and hip surgery. Hip is doing great (9 weeks out). Has had 3 separate back surgeries due to prior injuries. Then MRI in Jan revealed 3 disc issues; but then started having right leg issues. Thought back, but ended up having a fractured hip. Insurance took time and made him do PT prior to surgery. Ended up figuring out that hip was broken since sx not getting better in PT. Walking now. Does exercising on own; goes to gym daily and swims. Back is pretty achy, sore, stiff. Takes muscle relaxer at bed time. No longer taking pain medication.   Recent bloodwork in August normal incl lipid, cbc, bmp.  Sees Dr. Junious Silk for elevated PSA. Has had biopsies (x3) which have been clear. In July started having blood in urine. Ended up cancelling appointment with him but then saw him a few weeks ago and Dr Junious Silk did UA, bloodwork, and ordered CT abd, penis. Going on Tuesday for cystoscopy.  Sees derm q 6 months for skin checks.   Past Medical History:  Diagnosis Date  . Anxiety   . Cancer ([place])    facial skin cancer  . Depression   . GERD (gastroesophageal reflux disease)    takes tums rarely  . Hypertension   . Osteoarthritis   . Spinal stenosis    Past Surgical History:  Procedure Laterality Date  . ACHILLES TENDON SURGERY  02/2006  . ANTERIOR LAT LUMBAR FUSION  N/A 11/11/2017   Procedure: LUMBAR TWO- LUMBAR THREE, LUMBAR THREE- LUMBAR FOUR, LUMBAR FOUR- LUMBAR FIVE ANTERIOLATERAL INTERBODY ARTHRODESIS;  Surgeon: Jovita Gamma, MD;  Location: [facility];  Service: Neurosurgery;  Laterality: N/A;  . COLONOSCOPY  2003  . COLONOSCOPY    . KNEE SURGERY     X2  . LUMBAR PERCUTANEOUS PEDICLE SCREW 3 LEVEL N/A 11/11/2017   Procedure: LUMBAR PERCUTANEOUS PEDICLE SCREW PLACEMENT LUMBAR TWO-THREE, LUMBER THREE-FOUR, LUMBAR FOUR-FIVE;  Surgeon: Jovita Gamma, MD;  Location: [facility];  Service: Neurosurgery;  Laterality: N/A;  . SPINE SURGERY  12-08, 5-10  . TOTAL HIP ARTHROPLASTY    . TOTAL HIP ARTHROPLASTY Right 02/09/2018   Procedure: RIGHT TOTAL HIP ARTHROPLASTY ANTERIOR APPROACH;  Surgeon: Paralee Cancel, MD;  Location: [facility];  Service: Orthopedics;  Laterality: Right;   Allergies  Allergen Reactions  . Codeine Other (See Comments)    "made me sick"   Current Meds  Medication Sig  . acetaminophen (TYLENOL) 325 MG tablet Take 650 mg by mouth every 6 (six) hours as needed.  . Cholecalciferol (VITAMIN D3) 2000 units TABS Take by mouth.  . docusate sodium (COLACE) 100 MG capsule Take 1 capsule (100 mg total) by mouth 2 (two) times daily.  Marland Kitchen lisinopril-hydrochlorothiazide (PRINZIDE,ZESTORETIC) 20-25 MG tablet Take 1 tablet by mouth daily.  . meloxicam (MOBIC) 15 MG tablet Take 1 tablet (15 mg total) by mouth daily.  Marland Kitchen  Multiple Vitamin (MULTIVITAMIN) capsule Take 1 capsule by mouth daily.  Marland Kitchen tiZANidine (ZANAFLEX) 4 MG tablet Take 1 tablet (4 mg total) by mouth 3 (three) times daily as needed for muscle spasms.  . Zoster Vaccine Adjuvanted [facility]) injection Inject 0.5 mLs into the muscle once for 1 dose. Repeat in 2-6 months  . [DISCONTINUED] ferrous sulfate (FERROUSUL) 325 (65 FE) MG tablet Take 1 tablet (325 mg total) by mouth 3 (three) times daily with meals.  . [DISCONTINUED] HYDROcodone-acetaminophen (NORCO) 7.5-325 MG tablet Take 1-2 tablets by mouth  every 4 (four) hours as needed for moderate pain.  . [DISCONTINUED] polyethylene glycol (MIRALAX / GLYCOLAX) packet Take 17 g by mouth 2 (two) times daily.   Social History   Tobacco Use  . Smoking status: Never Smoker  . Smokeless tobacco: Never Used  Substance Use Topics  . Alcohol use: No   Family History  Problem Relation Age of Onset  . Diabetes Mother   . Coronary artery disease Mother 38  . Hypertension Mother   . Other Father 108  . Colon polyps Neg Hx   . Esophageal cancer Neg Hx   . Rectal cancer Neg Hx   . Stomach cancer Neg Hx      Review of Systems  Constitutional: Negative for chills, fatigue and fever.  Respiratory: Negative for cough, chest tightness, shortness of breath and wheezing.   Cardiovascular: Negative for chest pain, palpitations and leg swelling.  Musculoskeletal: Positive for arthralgias and back pain. Negative for gait problem and joint swelling.    Objective:  BP 122/62 (BP Location: Left Arm, Patient Position: Sitting, Cuff Size: Normal)   Pulse (!) 55   Temp 98.2 F (36.8 C) (Oral)   Wt 208 lb 12.8 oz (94.7 kg)   SpO2 91%   BMI 25.75 kg/m   Weight: 208 lb 12.8 oz (94.7 kg)   BP Readings from Last 3 Encounters:  04/12/18 122/62  02/10/18 (!) 156/74  02/02/18 (!) 142/78   Wt Readings from Last 3 Encounters:  04/12/18 208 lb 12.8 oz (94.7 kg)  02/09/18 196 lb 9.6 oz (89.2 kg)  02/02/18 196 lb 9.6 oz (89.2 kg)    Physical Exam  Constitutional: He is oriented to person, place, and time. He appears well-developed and well-nourished. No distress.  Cardiovascular: Normal rate, regular rhythm and normal heart sounds. Exam reveals no friction rub.  No murmur heard. No lower extremity edema  Pulmonary/Chest: Effort normal and breath sounds normal. No respiratory distress. He has no wheezes. He has no rales.  Neurological: He is alert and oriented to person, place, and time.  Psychiatric: His behavior is normal. Cognition and memory are  normal.    Assessment/Plan: 1. Essential hypertension Stable; continue current medication.   2. Osteoarthritis, unspecified osteoarthritis type, unspecified site Keep up with active lifestyle.   3. S/P right THA, AA Following with ortho.   4. Lumbar stenosis with neurogenic claudication Following with Back specialist.   5. Need for shingles vaccine  - Zoster Vaccine Adjuvanted [facility]) injection; Inject 0.5 mLs into the muscle once for 1 dose. Repeat in 2-6 months  Dispense: 0.5 mL; Refill: 0   Return physical in August.  Micheline Rough, MD

## 2018-04-12 ENCOUNTER — Encounter: Payer: Self-pay | Admitting: Family Medicine

## 2018-04-12 ENCOUNTER — Ambulatory Visit (INDEPENDENT_AMBULATORY_CARE_PROVIDER_SITE_OTHER): Payer: PPO | Admitting: Family Medicine

## 2018-04-12 VITALS — BP 122/62 | HR 55 | Temp 98.2°F | Wt 208.8 lb

## 2018-04-12 DIAGNOSIS — M48062 Spinal stenosis, lumbar region with neurogenic claudication: Secondary | ICD-10-CM | POA: Diagnosis not present

## 2018-04-12 DIAGNOSIS — Z23 Encounter for immunization: Secondary | ICD-10-CM | POA: Diagnosis not present

## 2018-04-12 DIAGNOSIS — M1990 Unspecified osteoarthritis, unspecified site: Secondary | ICD-10-CM | POA: Diagnosis not present

## 2018-04-12 DIAGNOSIS — I10 Essential (primary) hypertension: Secondary | ICD-10-CM

## 2018-04-12 DIAGNOSIS — Z96641 Presence of right artificial hip joint: Secondary | ICD-10-CM | POA: Diagnosis not present

## 2018-04-12 MED ORDER — ZOSTER VAC RECOMB ADJUVANTED 50 MCG/0.5ML IM SUSR: 0.5000 mL | Freq: Once | INTRAMUSCULAR | 0 refills | Status: AC

## 2018-04-20 DIAGNOSIS — R310 Gross hematuria: Secondary | ICD-10-CM | POA: Diagnosis not present

## 2018-05-11 ENCOUNTER — Other Ambulatory Visit: Payer: Self-pay | Admitting: Urology

## 2018-05-17 ENCOUNTER — Other Ambulatory Visit: Payer: Self-pay

## 2018-05-17 ENCOUNTER — Encounter (HOSPITAL_BASED_OUTPATIENT_CLINIC_OR_DEPARTMENT_OTHER): Payer: Self-pay | Admitting: *Deleted

## 2018-05-17 NOTE — Progress Notes (Addendum)
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

## 2018-05-20 NOTE — H&P (Signed)
Office Visit Report     04/20/2018   --------------------------------------------------------------------------------   Bryan [name]  MRN: [id number]  PRIMARY CARE:  Bluford Kaufmann, MD  DOB: September 12, 1945, 72 year old Male  REFERRING:  Bluford Kaufmann, MD  SSN: -[id number]  PROVIDER:  Festus Aloe, M.D.    LOCATION:  [facility] [phone]   --------------------------------------------------------------------------------   CC: I have blood in my urine.  HPI: Bryan [name] is a 72 year-old male established patient who is here for blood in the urine.  He did see the blood in his urine.   His last U/S or CT Scan was 04/05/2018.   He had an episode of gross hematuria July 2019. CT benign Oct 2019. He returns for cystoscopy. He took the Valium.     ALLERGIES: Codeine Derivatives - Vomiting, Nausea    MEDICATIONS: Aspirin Ec 81 mg tablet, delayed release  Lisinopril-Hydrochlorothiazide 20 mg-25 mg tablet Oral  Meloxicam 15 mg tablet Oral  Tizanidine Hcl 4 mg tablet PRN  Tylenol 1 PO Daily PRN     GU PSH: Locm 300-399Mg /Ml Iodine,1Ml - 04/05/2018 Prostate Needle Biopsy - 09/02/2017      PSH Notes: Back Surgery, Total Hip Replacement, Knee Arthroscopy   NON-GU PSH: Back surgery Total Hip Replacement - 2011 Surgical Pathology, Gross And Microscopic Examination For Prostate Needle - 09/02/2017    GU PMH: Elevated PSA - 03/23/2018, - 09/02/2017, - 08/11/2017, - 02/11/2017, - 2018, - 2017, Elevated prostate specific antigen (PSA), - 2017 Gross hematuria - 03/23/2018 BPH w/o LUTS (Stable) - 02/11/2017, Enlarged prostate without lower urinary tract symptoms (luts), - 2016 BPH w/LUTS, Benign prostatic hyperplasia with urinary obstruction - 2015      PMH Notes:  2009-11-20 14:59:14 - Note: Arthritis   NON-GU PMH: Encounter for general adult medical examination without abnormal findings, Encounter for preventive health examination - 2017 Personal history of  other diseases of the circulatory system, History of hypertension - 2014 Spinal stenosis, site unspecified, Spinal Stenosis - 2014    FAMILY HISTORY: Blood In Urine - Mother Diabetes - Mother Family Health Status Number - Runs In Family Hypertension - Mother   SOCIAL HISTORY: Marital Status: Married Preferred Language: English; Ethnicity: Not Hispanic Or Latino; Race: White Current Smoking Status: Patient has never smoked.  Has never drank.  Does not drink caffeine.    REVIEW OF SYSTEMS:    GU Review Male:   Patient denies frequent urination, hard to postpone urination, burning/ pain with urination, get up at night to urinate, leakage of urine, stream starts and stops, trouble starting your stream, have to strain to urinate , erection problems, and penile pain.  Gastrointestinal (Upper):   Patient denies nausea, vomiting, and indigestion/ heartburn.  Gastrointestinal (Lower):   Patient denies diarrhea and constipation.  Constitutional:   Patient denies fever, night sweats, weight loss, and fatigue.  Skin:   Patient denies skin rash/ lesion and itching.  Eyes:   Patient denies blurred vision and double vision.  Ears/ Nose/ Throat:   Patient denies sore throat and sinus problems.  Hematologic/Lymphatic:   Patient reports easy bruising. Patient denies swollen glands.  Cardiovascular:   Patient denies leg swelling and chest pains.  Respiratory:   Patient denies cough and shortness of breath.  Endocrine:   Patient denies excessive thirst.  Musculoskeletal:   Patient reports back pain and joint pain.   Neurological:   Patient denies headaches and dizziness.  Psychologic:   Patient reports anxiety. Patient denies depression.  VITAL SIGNS:      04/20/2018 12:59 PM  Weight 210 lb / 95.25 kg  Height 76 in / 193.04 cm  BP 130/80 mmHg  Pulse 69 /min  Temperature 97.3 F / 36.2 C  BMI 25.6 kg/m   GU PHYSICAL EXAMINATION:    Scrotum: No lesions. No edema. No cysts. No warts.  Urethral  Meatus: Normal size. No lesion, no wart, no discharge, no polyp. Normal location.  Penis: Circumcised, no warts, no cracks. No dorsal Peyronie's plaques, no left corporal Peyronie's plaques, no right corporal Peyronie's plaques, no scarring, no warts. No balanitis, no meatal stenosis.   MULTI-SYSTEM PHYSICAL EXAMINATION:    Constitutional: Well-nourished. No physical deformities. Normally developed. Good grooming.  Neck: Neck symmetrical, not swollen. Normal tracheal position.  Respiratory: No labored breathing, no use of accessory muscles.   Cardiovascular: Normal temperature, normal extremity pulses, no swelling, no varicosities.  Skin: No paleness, no jaundice, no cyanosis. No lesion, no ulcer, no rash.  Neurologic / Psychiatric: Oriented to time, oriented to place, oriented to person. No depression, no anxiety, no agitation.  Gastrointestinal: No mass, no tenderness, no rigidity, non obese abdomen.     PAST DATA REVIEWED:  Source Of History:  Patient   03/18/18 08/04/17 01/08/17 07/01/16 12/24/15 06/29/15 03/09/15 09/07/14  PSA  Total PSA 4.78 ng/mL 7.45 ng/mL 6.12 ng/mL 5.79 ng/dl 5.9  5.89  6.23  5.66   Free PSA 1.18 ng/mL 1.33 ng/mL 1.17 ng/mL  0.97  1.01  1.22  1.08   % Free PSA 25 % PSA 18 % PSA 19 % PSA  [id number]  17  20  19      PROCEDURES:         Flexible Cystoscopy - 52000  Risks, benefits, and some of the potential complications of the procedure were discussed with the patient. All questions were answered. Informed consent was obtained. Antibiotic prophylaxis was given -- Cephalexin. Sterile technique and intraurethral analgesia were used.  Meatus:  Normal size. Normal location. Normal condition.  Urethra:  No strictures.  External Sphincter:  Normal.      The lower urinary tract was not examined fully - procedure was poorly tolerated. He will need sedation. No complications. Antibiotic instructions were given. Instructions were given to call the office immediately  for bloody urine, difficulty urinating, painful urination, fever, chills, nausea, vomiting or other illness. The patient stated that he understood these instructions and would comply with them.         Urinalysis - 81003 Dipstick Dipstick Cont'd  Color: Yellow Bilirubin: Neg mg/dL  Appearance: Clear Ketones: Neg mg/dL  Specific Gravity: 1.025 Blood: Neg ery/uL  pH: 5.5 Protein: Neg mg/dL  Glucose: Neg mg/dL Urobilinogen: 0.2 mg/dL    Nitrites: Neg    Leukocyte Esterase: Neg leu/uL    ASSESSMENT:      ICD-10 Details  1 GU:   Gross hematuria - R31.0    PLAN:           Schedule Return Visit/Planned Activity: Next Available Appointment - Schedule Surgery          Document Letter(s):  Created for Patient: Clinical Summary         Notes:   gross hematuria - will set up for cysto under sedation.   cc; Dr. Raliegh Ip     * Signed by Festus Aloe, M.D. on 04/21/18 at 4:46 PM (EST)*     The information contained in this medical record document is considered private and confidential patient information. This  information can only be used for the medical diagnosis and/or medical services that are being provided by the patient's selected caregivers. This information can only be distributed outside of the patient's care if the patient agrees and signs waivers of authorization for this information to be sent to an outside source or route.

## 2018-05-21 ENCOUNTER — Ambulatory Visit (HOSPITAL_BASED_OUTPATIENT_CLINIC_OR_DEPARTMENT_OTHER): Payer: PPO | Admitting: Anesthesiology

## 2018-05-21 ENCOUNTER — Ambulatory Visit (HOSPITAL_BASED_OUTPATIENT_CLINIC_OR_DEPARTMENT_OTHER)
Admission: RE | Admit: 2018-05-21 | Discharge: 2018-05-21 | Disposition: A | Discharge status: Home / Self Care | Payer: PPO | Source: Ambulatory Visit | Attending: Urology | Admitting: Urology

## 2018-05-21 ENCOUNTER — Encounter (HOSPITAL_BASED_OUTPATIENT_CLINIC_OR_DEPARTMENT_OTHER): Payer: Self-pay

## 2018-05-21 ENCOUNTER — Encounter (HOSPITAL_BASED_OUTPATIENT_CLINIC_OR_DEPARTMENT_OTHER)
Admission: RE | Disposition: A | Discharge status: Home / Self Care | Payer: Self-pay | Source: Ambulatory Visit | Attending: Urology

## 2018-05-21 ENCOUNTER — Other Ambulatory Visit: Payer: Self-pay

## 2018-05-21 DIAGNOSIS — Z791 Long term (current) use of non-steroidal anti-inflammatories (NSAID): Secondary | ICD-10-CM | POA: Insufficient documentation

## 2018-05-21 DIAGNOSIS — Z96649 Presence of unspecified artificial hip joint: Secondary | ICD-10-CM | POA: Diagnosis not present

## 2018-05-21 DIAGNOSIS — R310 Gross hematuria: Secondary | ICD-10-CM | POA: Diagnosis not present

## 2018-05-21 DIAGNOSIS — N400 Enlarged prostate without lower urinary tract symptoms: Secondary | ICD-10-CM | POA: Diagnosis not present

## 2018-05-21 DIAGNOSIS — R319 Hematuria, unspecified: Secondary | ICD-10-CM | POA: Diagnosis present

## 2018-05-21 DIAGNOSIS — Z7982 Long term (current) use of aspirin: Secondary | ICD-10-CM | POA: Diagnosis not present

## 2018-05-21 DIAGNOSIS — I10 Essential (primary) hypertension: Secondary | ICD-10-CM | POA: Insufficient documentation

## 2018-05-21 DIAGNOSIS — Z79899 Other long term (current) drug therapy: Secondary | ICD-10-CM | POA: Diagnosis not present

## 2018-05-21 HISTORY — PX: CYSTOSCOPY: SHX5120

## 2018-05-21 HISTORY — DX: Hematuria, unspecified: R31.9

## 2018-05-21 SURGERY — CYSTOSCOPY, FLEXIBLE
Anesthesia: General | Site: Bladder

## 2018-05-21 MED ORDER — SUCCINYLCHOLINE CHLORIDE 200 MG/10ML IV SOSY
PREFILLED_SYRINGE | INTRAVENOUS | Status: AC
  Filled 2018-05-21: qty 10

## 2018-05-21 MED ORDER — FENTANYL CITRATE (PF) 100 MCG/2ML IJ SOLN
25.0000 ug | INTRAMUSCULAR | Status: DC | PRN
  Filled 2018-05-21: qty 1

## 2018-05-21 MED ORDER — LIDOCAINE 2% (20 MG/ML) 5 ML SYRINGE
INTRAMUSCULAR | Status: DC | PRN
  Administered 2018-05-21: 800 mg via INTRAVENOUS

## 2018-05-21 MED ORDER — PROPOFOL 10 MG/ML IV BOLUS
INTRAVENOUS | Status: AC
  Filled 2018-05-21: qty 20

## 2018-05-21 MED ORDER — DEXAMETHASONE SODIUM PHOSPHATE 10 MG/ML IJ SOLN
INTRAMUSCULAR | Status: AC
  Filled 2018-05-21: qty 1

## 2018-05-21 MED ORDER — KETOROLAC TROMETHAMINE 30 MG/ML IJ SOLN
INTRAMUSCULAR | Status: DC | PRN
  Administered 2018-05-21: 30 mg via INTRAVENOUS

## 2018-05-21 MED ORDER — SUCCINYLCHOLINE CHLORIDE 200 MG/10ML IV SOSY
PREFILLED_SYRINGE | INTRAVENOUS | Status: DC | PRN
  Administered 2018-05-21: 200 mg via INTRAVENOUS

## 2018-05-21 MED ORDER — LIDOCAINE 2% (20 MG/ML) 5 ML SYRINGE
INTRAMUSCULAR | Status: AC
  Filled 2018-05-21: qty 5

## 2018-05-21 MED ORDER — MIDAZOLAM HCL 2 MG/2ML IJ SOLN
INTRAMUSCULAR | Status: AC
  Filled 2018-05-21: qty 2

## 2018-05-21 MED ORDER — PROPOFOL 10 MG/ML IV BOLUS
INTRAVENOUS | Status: DC | PRN
  Administered 2018-05-21: 150 mg via INTRAVENOUS

## 2018-05-21 MED ORDER — FENTANYL CITRATE (PF) 100 MCG/2ML IJ SOLN
INTRAMUSCULAR | Status: AC
  Filled 2018-05-21: qty 2

## 2018-05-21 MED ORDER — STERILE WATER FOR IRRIGATION IR SOLN
Status: DC | PRN
  Administered 2018-05-21: 3000 mL

## 2018-05-21 MED ORDER — ONDANSETRON HCL 4 MG/2ML IJ SOLN
INTRAMUSCULAR | Status: AC
  Filled 2018-05-21: qty 2

## 2018-05-21 MED ORDER — KETOROLAC TROMETHAMINE 30 MG/ML IJ SOLN
INTRAMUSCULAR | Status: AC
  Filled 2018-05-21: qty 1

## 2018-05-21 MED ORDER — LIDOCAINE HCL URETHRAL/MUCOSAL 2 % EX GEL
CUTANEOUS | Status: DC | PRN
  Administered 2018-05-21: 1

## 2018-05-21 MED ORDER — DEXAMETHASONE SODIUM PHOSPHATE 10 MG/ML IJ SOLN
INTRAMUSCULAR | Status: DC | PRN
  Administered 2018-05-21: 8 mg via INTRAVENOUS

## 2018-05-21 MED ORDER — CEPHALEXIN 500 MG PO CAPS
500.0000 mg | ORAL_CAPSULE | Freq: Once | ORAL | Status: AC
  Administered 2018-05-21: 500 mg via ORAL
  Filled 2018-05-21 (×2): qty 1

## 2018-05-21 MED ORDER — LACTATED RINGERS IV SOLN
INTRAVENOUS | Status: DC
  Administered 2018-05-21 (×2): 1000 mL via INTRAVENOUS
  Filled 2018-05-21: qty 1000

## 2018-05-21 MED ORDER — FENTANYL CITRATE (PF) 100 MCG/2ML IJ SOLN
INTRAMUSCULAR | Status: DC | PRN
  Administered 2018-05-21: 50 ug via INTRAVENOUS

## 2018-05-21 MED ORDER — ONDANSETRON HCL 4 MG/2ML IJ SOLN
INTRAMUSCULAR | Status: DC | PRN
  Administered 2018-05-21: 4 mg via INTRAVENOUS

## 2018-05-21 MED ORDER — MIDAZOLAM HCL 2 MG/2ML IJ SOLN
INTRAMUSCULAR | Status: DC | PRN
  Administered 2018-05-21 (×2): 1 mg via INTRAVENOUS

## 2018-05-21 SURGICAL SUPPLY — 17 items
BAG DRAIN URO-CYSTO SKYTR STRL (DRAIN) ×3 IMPLANT
CATH FOLEY 2WAY SLVR  5CC 16FR (CATHETERS) ×2
CATH FOLEY 2WAY SLVR 5CC 16FR (CATHETERS) ×1 IMPLANT
CLOTH BEACON ORANGE TIMEOUT ST (SAFETY) ×6 IMPLANT
ELECT REM PT RETURN 9FT ADLT (ELECTROSURGICAL) ×3
ELECTRODE REM PT RTRN 9FT ADLT (ELECTROSURGICAL) ×1 IMPLANT
GLOVE BIO SURGEON STRL SZ7.5 (GLOVE) ×3 IMPLANT
GOWN STRL REUS W/ TWL XL LVL3 (GOWN DISPOSABLE) ×1 IMPLANT
GOWN STRL REUS W/TWL XL LVL3 (GOWN DISPOSABLE) ×2
KIT TURNOVER CYSTO (KITS) ×3 IMPLANT
MANIFOLD NEPTUNE II (INSTRUMENTS) IMPLANT
NEEDLE HYPO 22GX1.5 SAFETY (NEEDLE) IMPLANT
NS IRRIG 500ML POUR BTL (IV SOLUTION) IMPLANT
PACK CYSTO (CUSTOM PROCEDURE TRAY) ×3 IMPLANT
TUBE CONNECTING 12'X1/4 (SUCTIONS)
TUBE CONNECTING 12X1/4 (SUCTIONS) IMPLANT
WATER STERILE IRR 3000ML UROMA (IV SOLUTION) ×3 IMPLANT

## 2018-05-21 NOTE — Transfer of Care (Signed)
Immediate Anesthesia Transfer of Care Note  Patient: Bryan [name]  Procedure(s) Performed: CYSTOSCOPY FLEXIBLE (N/A )  Patient Location: PACU  Anesthesia Type:General  Level of Consciousness: awake, alert , oriented and patient cooperative  Airway & Oxygen Therapy: Patient Spontanous Breathing and Patient connected to nasal cannula oxygen  Post-op Assessment: Report given to RN and Post -op Vital signs reviewed and stable  Post vital signs: Reviewed and stable  Last Vitals:  Vitals Value Taken Time  BP 140/87 05/21/2018 12:15 PM  Temp    Pulse 74 05/21/2018 12:15 PM  Resp 14 05/21/2018 12:15 PM  SpO2 99 % 05/21/2018 12:15 PM  Vitals shown include unvalidated device data.  Last Pain:  Vitals:   05/21/18 0910  TempSrc: Oral      Patients Stated Pain Goal: 4 (70/26/37 8588)  Complications: No apparent anesthesia complications

## 2018-05-21 NOTE — Anesthesia Procedure Notes (Signed)
Procedure Name: Intubation Date/Time: 05/21/2018 12:02 PM Performed by: Freddrick March, MD Pre-anesthesia Checklist: Patient identified, Emergency Drugs available, Suction available and Patient being monitored Patient Re-evaluated:Patient Re-evaluated prior to induction Oxygen Delivery Method: Circle system utilized Preoxygenation: Pre-oxygenation with 100% oxygen Induction Type: IV induction Ventilation: Mask ventilation without difficulty Laryngoscope Size: Mac and 4 Grade View: Grade I Tube type: Oral Tube size: 7.5 mm Number of attempts: 1 Airway Equipment and Method: Stylet and Oral airway Placement Confirmation: ETT inserted through vocal cords under direct vision,  positive ETCO2 and breath sounds checked- equal and bilateral Secured at: 23 cm Tube secured with: Tape Dental Injury: Teeth and Oropharynx as per pre-operative assessment

## 2018-05-21 NOTE — Anesthesia Preprocedure Evaluation (Addendum)
Anesthesia Evaluation  Patient identified by MRN, date of birth, ID band Patient awake    Reviewed: Allergy & Precautions, NPO status , Patient's Chart, lab work & pertinent test results  Airway Mallampati: I  TM Distance: >3 FB Neck ROM: Full    Dental no notable dental hx. (+) Teeth Intact, Dental Advisory Given   Pulmonary neg pulmonary ROS,    Pulmonary exam normal breath sounds clear to auscultation       Cardiovascular hypertension, Pt. on medications negative cardio ROS Normal cardiovascular exam Rhythm:Regular Rate:Normal     Neuro/Psych PSYCHIATRIC DISORDERS Anxiety Depression negative neurological ROS     GI/Hepatic Neg liver ROS, GERD  ,  Endo/Other  negative endocrine ROS  Renal/GU negative Renal ROS  negative genitourinary   Musculoskeletal  (+) Arthritis ,   Abdominal   Peds  Hematology negative hematology ROS (+)   Anesthesia Other Findings Gross hematuria, BPH  Reproductive/Obstetrics                            Anesthesia Physical Anesthesia Plan  ASA: II  Anesthesia Plan: General   Post-op Pain Management:    Induction: Intravenous  PONV Risk Score and Plan: 2 and Ondansetron and Dexamethasone  Airway Management Planned: Oral ETT  Additional Equipment:   Intra-op Plan:   Post-operative Plan: Extubation in OR  Informed Consent: I have reviewed the patients History and Physical, chart, labs and discussed the procedure including the risks, benefits and alternatives for the proposed anesthesia with the patient or authorized representative who has indicated his/her understanding and acceptance.   Dental advisory given  Plan Discussed with: CRNA  Anesthesia Plan Comments:        Anesthesia Quick Evaluation

## 2018-05-21 NOTE — Op Note (Signed)
Preoperative diagnosis: Gross hematuria Postoperative diagnosis: Same  Procedure: Flexible cystoscopy  Surgeon: Junious Silk  Anesthesia: General  Indication for procedure: 72 year old white male with a history of BPH and elevated PSA.  He experienced an episode of gross hematuria and did not tolerate office cystoscopy.  Findings: On exam under anesthesia the penis was circumcised without mass or lesion.  The testicles were descended bilaterally and palpably normal.  On digital rectal exam the prostate was palpably normal without hard area or nodule.  All landmarks preserved.  On cystoscopy, the urethra was unremarkable.  The prostatic urethra was obstructed by lateral lobes.  He be a good candidate for Rezum or UroLift, laser or TURP if he ever needed it.  The trigone and ureteral orifice ease were in their normal orthotopic position with clear efflux.  The bladder was moderately trabeculated and a few small cellules posteriorly.  Otherwise the mucosa appeared normal without lesion.  There was no stone or foreign body in the bladder.  Description of procedure: After consent was obtained patient brought to the operating room.  After adequate anesthesia he was placed in lithotomy position.  Timeout was performed to confirm the patient and procedure.  An exam under anesthesia was performed.  He was prepped and draped in the usual sterile fashion.  Flexible cystoscope was passed per urethra and the bladder carefully inspected.  The cystoscope was withdrawn.  Lidocaine jelly was instilled per urethra and a 16 French Foley placed to drain the bladder.  The catheter was removed.  The patient was awakened and taken recovery room in stable condition.  Complications: None  Blood loss: None  Specimens: None  Drains: None  Disposition: Patient stable to PACU

## 2018-05-21 NOTE — Discharge Instructions (Signed)
Cystoscopy, Care After °Refer to this sheet in the next few weeks. These instructions provide you with information about caring for yourself after your procedure. Your health care provider may also give you more specific instructions. Your treatment has been planned according to current medical practices, but problems sometimes occur. Call your health care provider if you have any problems or questions after your procedure. °What can I expect after the procedure? °After the procedure, it is common to have: °· Mild pain when you urinate. Pain should stop within a few minutes after you urinate. This may last for up to 1 week. °· A small amount of blood in your urine for several days. °· Feeling like you need to urinate but producing only a small amount of urine. ° °Follow these instructions at home: ° °Medicines °· Take over-the-counter and prescription medicines only as told by your health care provider. °· If you were prescribed an antibiotic medicine, take it as told by your health care provider. Do not stop taking the antibiotic even if you start to feel better. °General instructions ° °· Return to your normal activities as told by your health care provider. Ask your health care provider what activities are safe for you. °· Do not drive for 24 hours if you received a sedative. °· Watch for any blood in your urine. If the amount of blood in your urine increases, call your health care provider. °· Follow instructions from your health care provider about eating or drinking restrictions. °· If a tissue sample was removed for testing (biopsy) during your procedure, it is your responsibility to get your test results. Ask your health care provider or the department performing the test when your results will be ready. °· Drink enough fluid to keep your urine clear or pale yellow. °· Keep all follow-up visits as told by your health care provider. This is important. °Contact a health care provider if: °· You have pain that  gets worse or does not get better with medicine, especially pain when you urinate. °· You have difficulty urinating. °Get help right away if: °· You have more blood in your urine. °· You have blood clots in your urine. °· You have abdominal pain. °· You have a fever or chills. °· You are unable to urinate. °This information is not intended to replace advice given to you by your health care provider. Make sure you discuss any questions you have with your health care provider. °Document Released: 12/20/2004 Document Revised: 11/08/2015 Document Reviewed: 04/19/2015 °Elsevier Interactive Patient Education © 2018 [employer] ° ° ° °Post Anesthesia Home Care Instructions ° °Activity: °Get plenty of rest for the remainder of the day. A responsible individual must stay with you for 24 hours following the procedure.  °For the next 24 hours, DO NOT: °-Drive a car °-Operate machinery °-Drink alcoholic beverages °-Take any medication unless instructed by your physician °-Make any legal decisions or sign important papers. ° °Meals: °Start with liquid foods such as gelatin or soup. Progress to regular foods as tolerated. Avoid greasy, spicy, heavy foods. If nausea and/or vomiting occur, drink only clear liquids until the nausea and/or vomiting subsides. Call your physician if vomiting continues. ° °Special Instructions/Symptoms: °Your throat may feel dry or sore from the anesthesia or the breathing tube placed in your throat during surgery. If this causes discomfort, gargle with warm salt water. The discomfort should disappear within 24 hours. ° °If you had a scopolamine patch placed behind your ear for the management   of post- operative nausea and/or vomiting: ° °1. The medication in the patch is effective for 72 hours, after which it should be removed.  Wrap patch in a tissue and discard in the trash. Wash hands thoroughly with soap and water. °2. You may remove the patch earlier than 72 hours if you experience unpleasant  side effects which may include dry mouth, dizziness or visual disturbances. °3. Avoid touching the patch. Wash your hands with soap and water after contact with the patch. °  ° °

## 2018-05-21 NOTE — Interval H&P Note (Signed)
History and Physical Interval Note:  05/21/2018 11:29 AM  Bryan [name]  has presented today for surgery, with the diagnosis of GROSS HEMATURIA, BENIGN PROSTATIC HYPERPLASIA  The various methods of treatment have been discussed with the patient and family. After consideration of risks, benefits and other options for treatment, the patient has consented to  Procedure(s): CYSTOSCOPY FLEXIBLE (N/A) as a surgical intervention .  The patient's history has been reviewed, patient examined, no change in status, stable for surgery. We discussed he may need a bladder biopsy or TURBT if any lesions are found and he may need to go home with a foley.  I have reviewed the patient's chart and labs.  Questions were answered to the patient's satisfaction.  He elects to proceed.    Bryan [name]

## 2018-05-21 NOTE — Anesthesia Postprocedure Evaluation (Signed)
Anesthesia Post Note  Patient: Bryan [name]  Procedure(s) Performed: CYSTOSCOPY FLEXIBLE (N/A Bladder)     Patient location during evaluation: PACU Anesthesia Type: General Level of consciousness: awake and alert Pain management: pain level controlled Vital Signs Assessment: post-procedure vital signs reviewed and stable Respiratory status: spontaneous breathing, nonlabored ventilation, respiratory function stable and patient connected to nasal cannula oxygen Cardiovascular status: blood pressure returned to baseline and stable Postop Assessment: no apparent nausea or vomiting Anesthetic complications: no    Last Vitals:  Vitals:   05/21/18 1245 05/21/18 1330  BP: 128/78 129/63  Pulse: 66 62  Resp: 18 14  Temp:  36.4 C  SpO2: 96% 100%    Last Pain:  Vitals:   05/21/18 1320  TempSrc:   PainSc: 0-No pain                 Nelta Caudill L Sharetha Newson

## 2018-05-24 ENCOUNTER — Encounter (HOSPITAL_BASED_OUTPATIENT_CLINIC_OR_DEPARTMENT_OTHER): Payer: Self-pay | Admitting: Urology

## 2018-06-03 ENCOUNTER — Other Ambulatory Visit: Payer: Self-pay | Admitting: Internal Medicine

## 2018-06-03 DIAGNOSIS — M47816 Spondylosis without myelopathy or radiculopathy, lumbar region: Secondary | ICD-10-CM | POA: Diagnosis not present

## 2018-06-03 DIAGNOSIS — Z6826 Body mass index (BMI) 26.0-26.9, adult: Secondary | ICD-10-CM | POA: Diagnosis not present

## 2018-06-03 DIAGNOSIS — Z981 Arthrodesis status: Secondary | ICD-10-CM | POA: Diagnosis not present

## 2018-06-03 DIAGNOSIS — M5136 Other intervertebral disc degeneration, lumbar region: Secondary | ICD-10-CM | POA: Diagnosis not present

## 2018-07-20 IMAGING — RF DG C-ARM 61-120 MIN
1 series · 9 of 9 positions shown · non-contrast
Comparison: None.

CLINICAL DATA: Multilevel XLIF

EXAM:
LUMBAR SPINE - 2-3 VIEW; DG C-ARM 61-120 MIN

[Series 1: run · 9 of 9 slices shown]
[im 1/9]
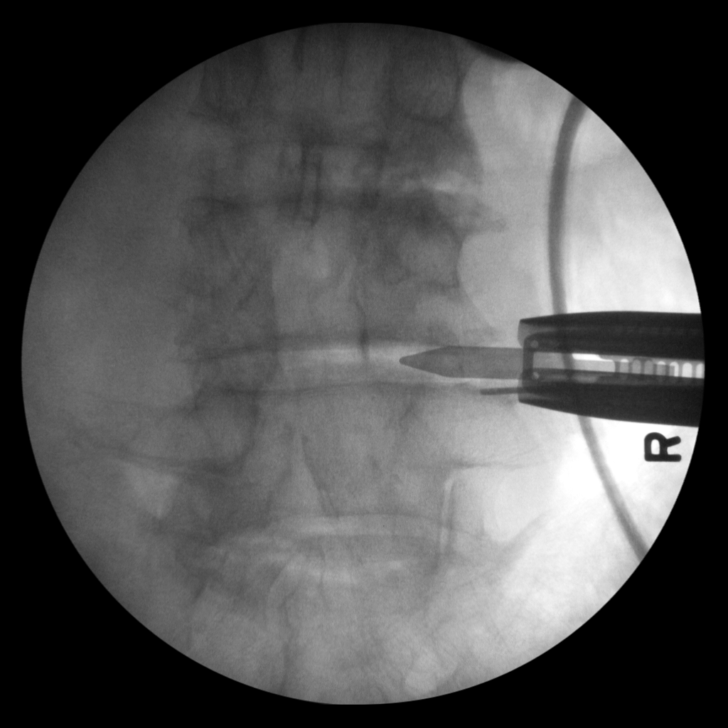
[im 2/9]
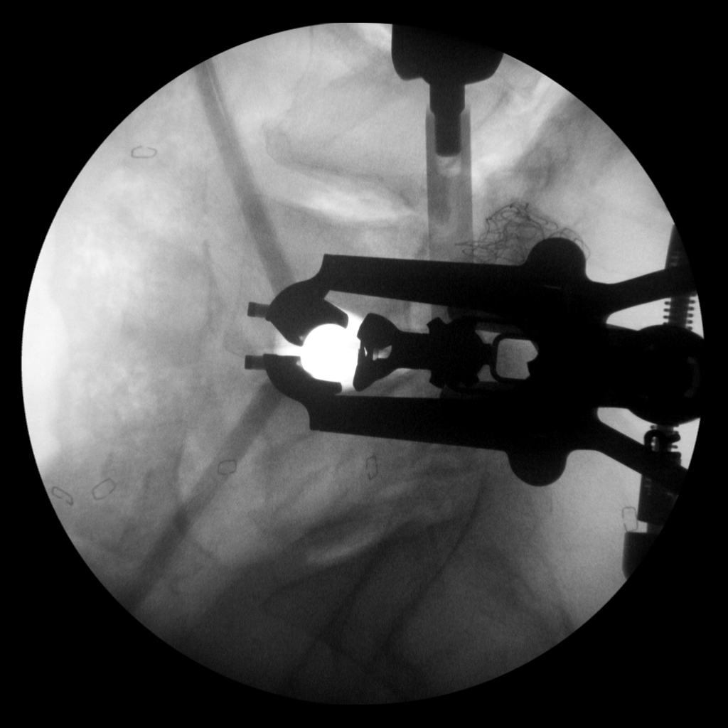
[im 3/9]
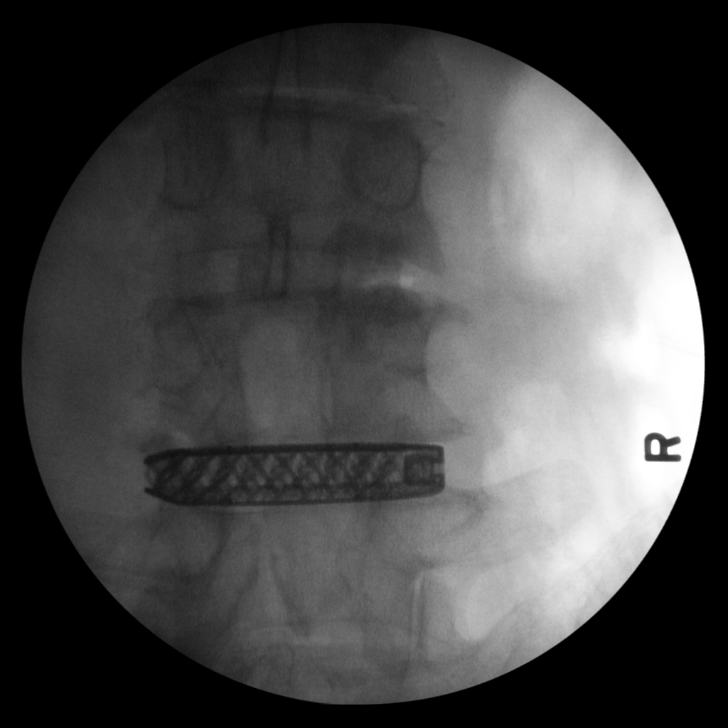
[im 4/9]
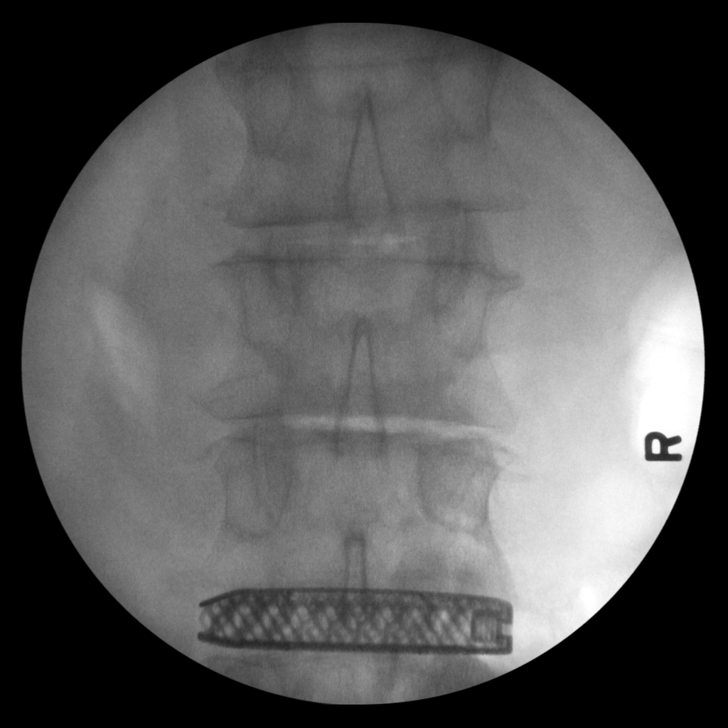
[im 5/9]
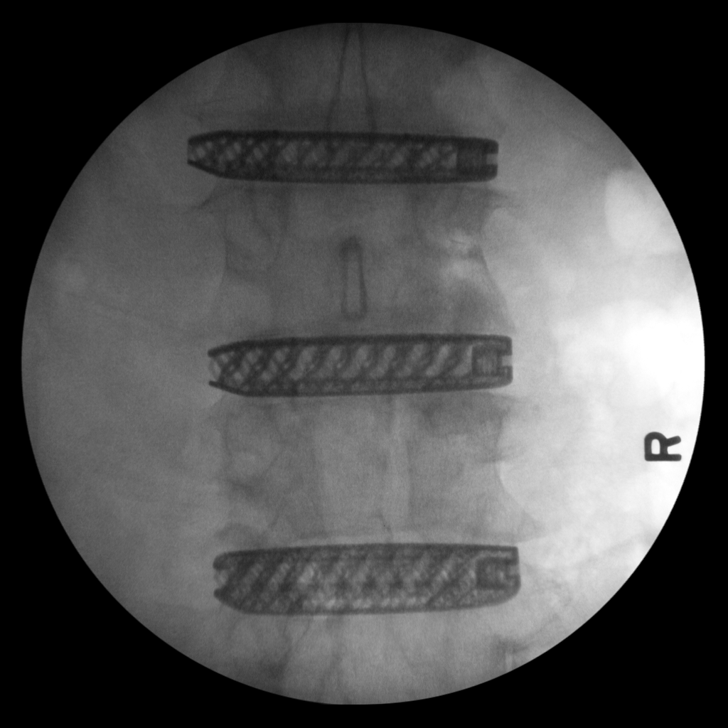
[im 6/9]
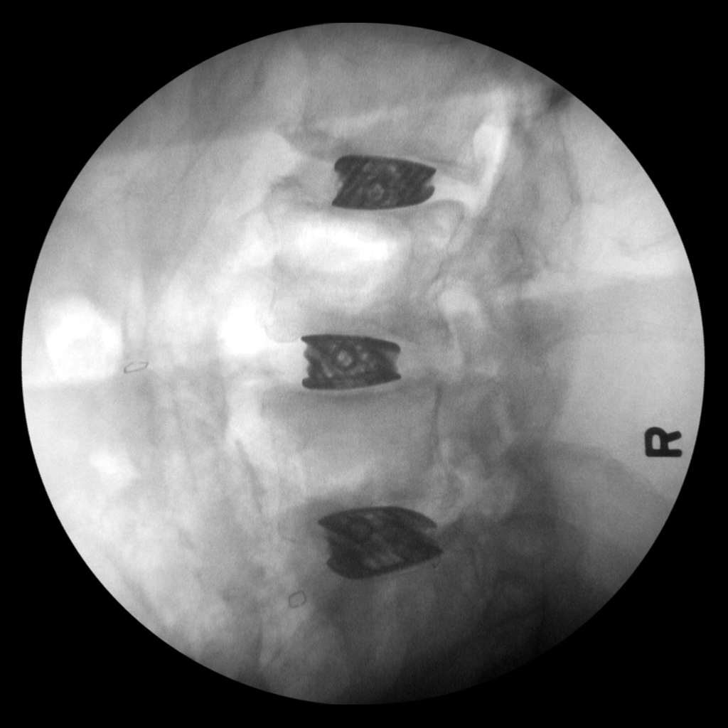
[im 7/9]
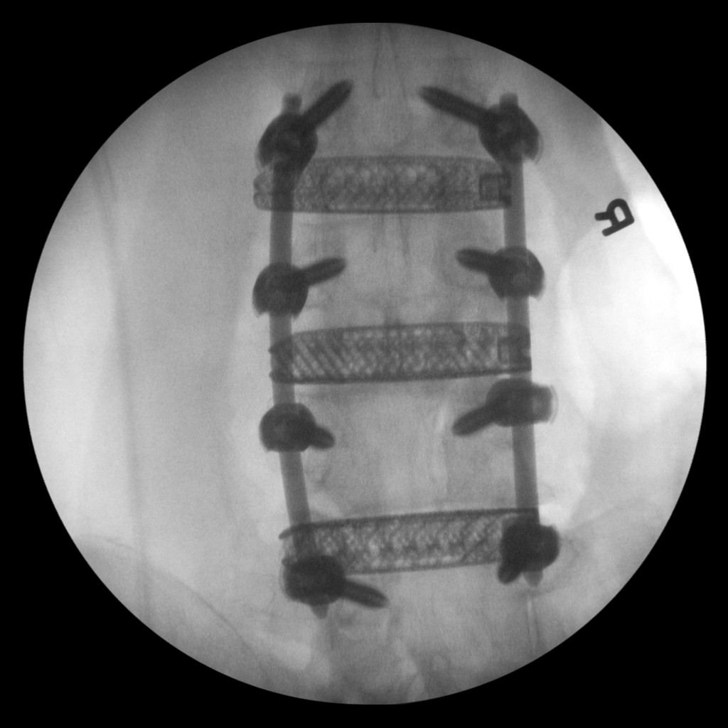
[im 8/9]
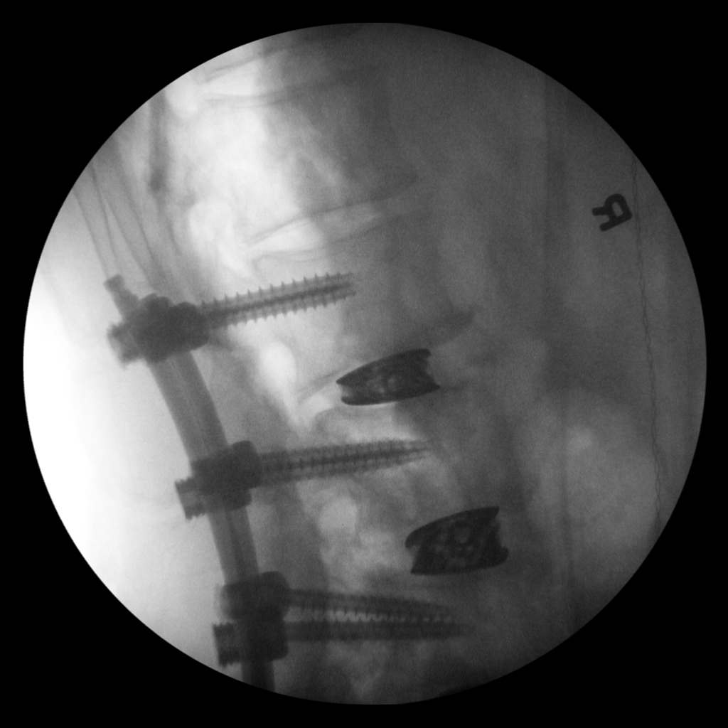
[im 9/9]
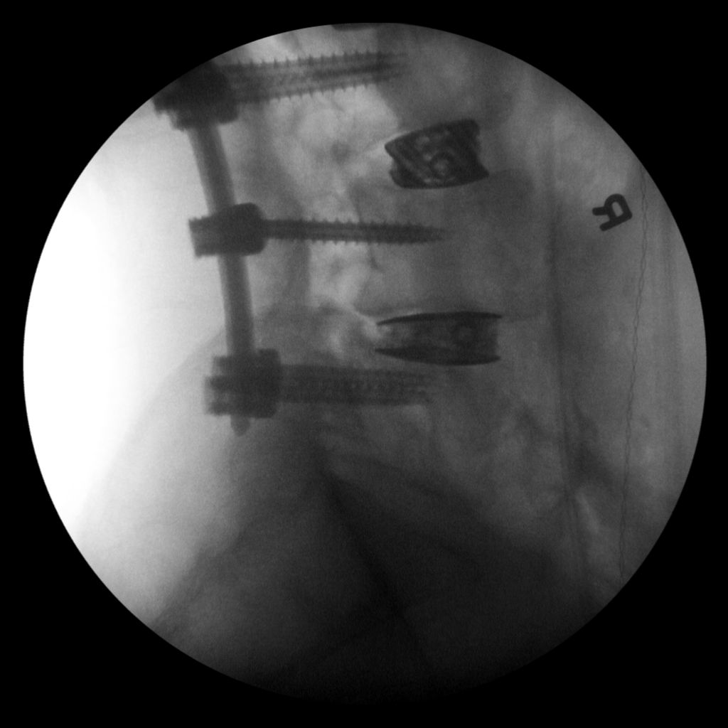

[9 of 9 positions shown; findings below may reference images not displayed]

FLUOROSCOPY TIME:  Fluoroscopy Time:  7 minutes 22 seconds

Radiation Exposure Index (if provided by the fluoroscopic device):
Not available

Number of Acquired Spot Images: 9
FINDINGS: Initial images demonstrate placement of the fusion device at L4-5
from a lateral approach. Subsequently interbody fusion devices at
L2-3 and L3-4 are noted in place. Pedicle screws are noted
bilaterally from L2-L5 with posterior fixation.
IMPRESSION: Interbody fusion from L2-L5.

## 2018-09-21 ENCOUNTER — Other Ambulatory Visit: Payer: Self-pay | Admitting: Family Medicine

## 2018-09-22 MED ORDER — MELOXICAM 15 MG PO TABS: 15.0000 mg | ORAL_TABLET | Freq: Every day | ORAL | 0 refills | Status: DC

## 2018-10-18 IMAGING — DX DG PORTABLE PELVIS
1 series · 1 of 1 positions shown · non-contrast
Comparison: 02/09/2018

CLINICAL DATA: Postop right hip replacement

EXAM:
PORTABLE PELVIS 1-2 VIEWS

[pelvis ap]
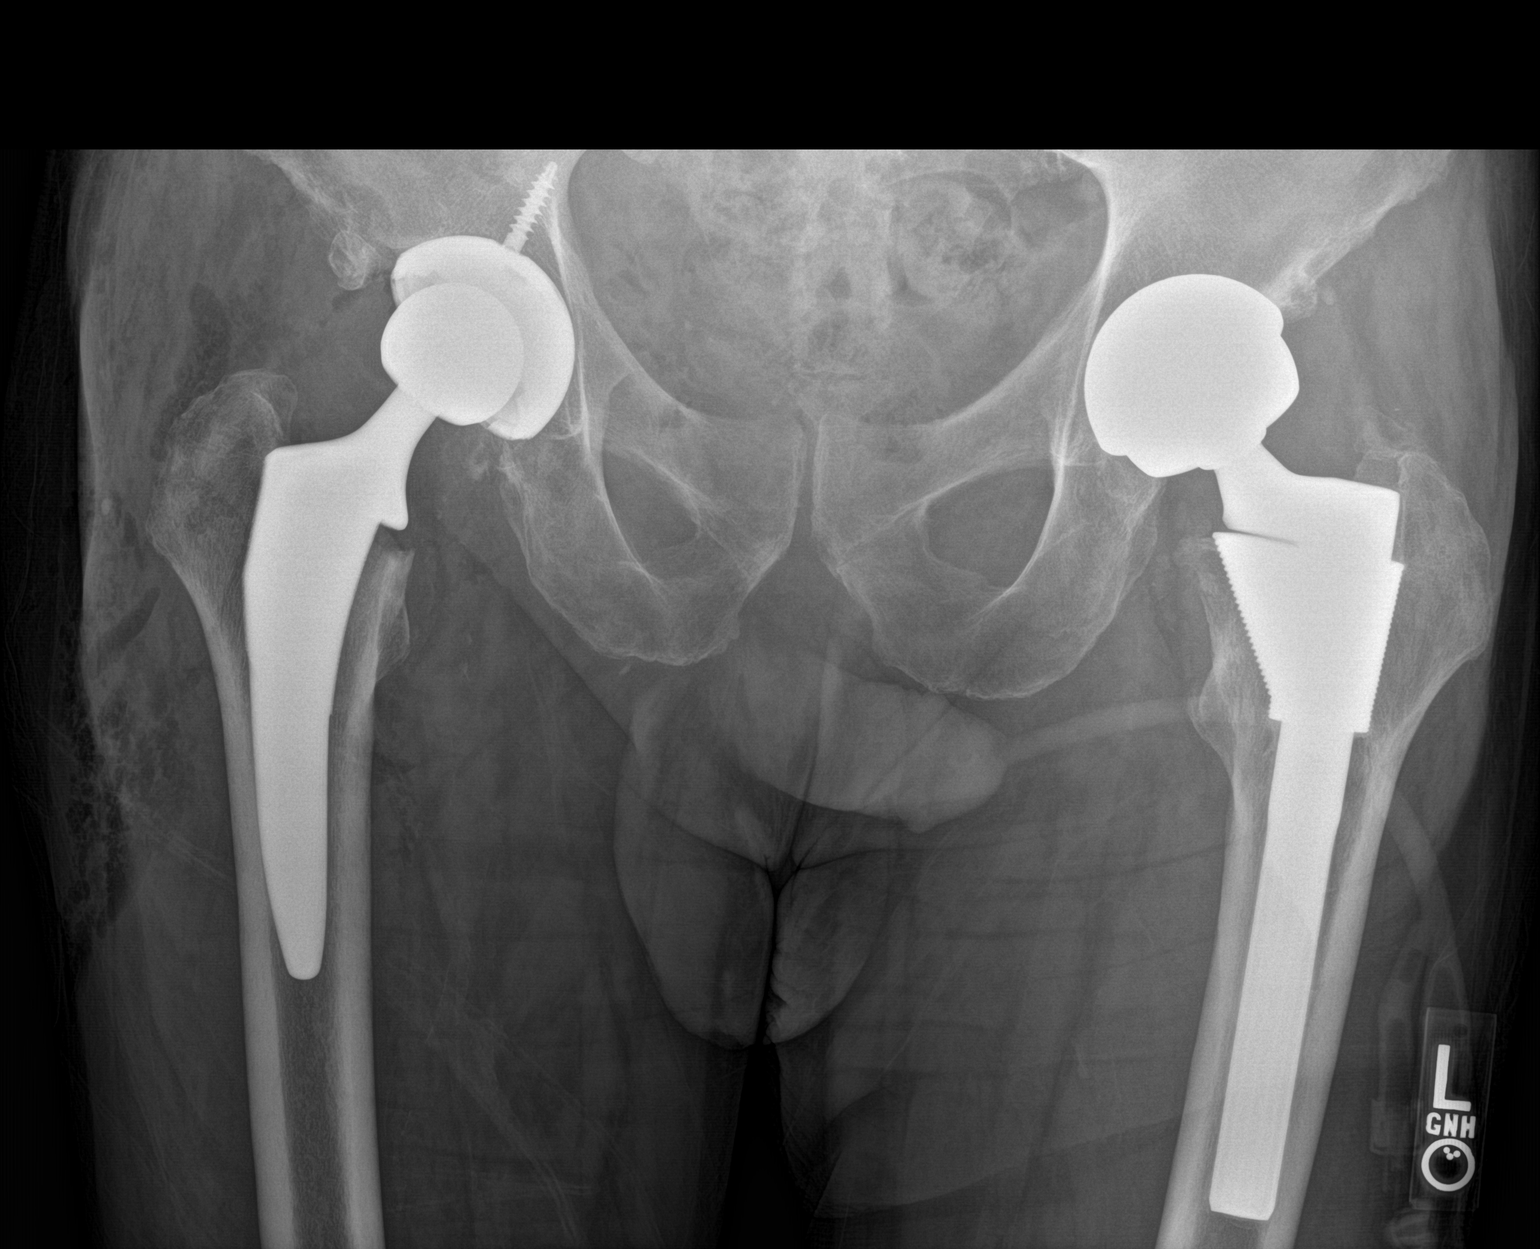

[1 of 1 positions shown; findings below may reference images not displayed]

FINDINGS: New total right hip arthroplasty. Components appear well positioned.
No radiographically detectable complication. Previous left hip
arthroplasty appears unremarkable.
IMPRESSION: Good appearance following new right total hip arthroplasty.

## 2018-12-23 ENCOUNTER — Other Ambulatory Visit: Payer: Self-pay | Admitting: Family Medicine

## 2018-12-28 ENCOUNTER — Other Ambulatory Visit: Payer: Self-pay | Admitting: Family Medicine

## 2019-01-04 NOTE — Telephone Encounter (Signed)
Pt following up on refill request meloxicam (MOBIC) 15 MG table90 day  Pt states he has been taking this for yrs.  This was requested 7/14.  [place], [place] [phone] (Phone) [phone] (Fax)

## 2019-01-04 NOTE — Telephone Encounter (Signed)
Rx done. 

## 2019-01-31 ENCOUNTER — Encounter: Payer: PPO | Admitting: Family Medicine

## 2019-02-08 ENCOUNTER — Other Ambulatory Visit: Payer: Self-pay | Admitting: Family Medicine

## 2019-03-01 ENCOUNTER — Other Ambulatory Visit: Payer: Self-pay

## 2019-03-02 ENCOUNTER — Encounter: Payer: Self-pay | Admitting: Family Medicine

## 2019-03-02 ENCOUNTER — Other Ambulatory Visit: Payer: Self-pay | Admitting: Family Medicine

## 2019-03-02 ENCOUNTER — Ambulatory Visit (INDEPENDENT_AMBULATORY_CARE_PROVIDER_SITE_OTHER): Payer: Medicare Other | Admitting: Family Medicine

## 2019-03-02 VITALS — BP 102/82 | HR 73 | Temp 97.1°F | Ht 75.5 in | Wt 213.6 lb

## 2019-03-02 DIAGNOSIS — R1031 Right lower quadrant pain: Secondary | ICD-10-CM

## 2019-03-02 DIAGNOSIS — G629 Polyneuropathy, unspecified: Secondary | ICD-10-CM | POA: Diagnosis not present

## 2019-03-02 DIAGNOSIS — M1990 Unspecified osteoarthritis, unspecified site: Secondary | ICD-10-CM | POA: Diagnosis not present

## 2019-03-02 DIAGNOSIS — Z23 Encounter for immunization: Secondary | ICD-10-CM | POA: Diagnosis not present

## 2019-03-02 DIAGNOSIS — E559 Vitamin D deficiency, unspecified: Secondary | ICD-10-CM | POA: Diagnosis not present

## 2019-03-02 DIAGNOSIS — R972 Elevated prostate specific antigen [PSA]: Secondary | ICD-10-CM

## 2019-03-02 DIAGNOSIS — I10 Essential (primary) hypertension: Secondary | ICD-10-CM | POA: Diagnosis not present

## 2019-03-02 DIAGNOSIS — Z13220 Encounter for screening for lipoid disorders: Secondary | ICD-10-CM

## 2019-03-02 DIAGNOSIS — E538 Deficiency of other specified B group vitamins: Secondary | ICD-10-CM | POA: Diagnosis not present

## 2019-03-02 DIAGNOSIS — Z0000 Encounter for general adult medical examination without abnormal findings: Secondary | ICD-10-CM

## 2019-03-02 LAB — CBC WITH DIFFERENTIAL/PLATELET
Basophils Absolute: 0 10*3/uL (ref 0.0–0.1)
Basophils Relative: 0.8 % (ref 0.0–3.0)
Eosinophils Absolute: 0.2 10*3/uL (ref 0.0–0.7)
Eosinophils Relative: 3.6 % (ref 0.0–5.0)
HCT: 51.1 % (ref 39.0–52.0)
Hemoglobin: 17.5 g/dL — ABNORMAL HIGH (ref 13.0–17.0)
Lymphocytes Relative: 19.9 % (ref 12.0–46.0)
Lymphs Abs: 0.9 10*3/uL (ref 0.7–4.0)
MCHC: 34.2 g/dL (ref 30.0–36.0)
MCV: 93 fl (ref 78.0–100.0)
Monocytes Absolute: 0.4 10*3/uL (ref 0.1–1.0)
Monocytes Relative: 8.2 % (ref 3.0–12.0)
Neutro Abs: 3 10*3/uL (ref 1.4–7.7)
Neutrophils Relative %: 67.5 % (ref 43.0–77.0)
Platelets: 198 10*3/uL (ref 150.0–400.0)
RBC: 5.5 Mil/uL (ref 4.22–5.81)
RDW: 13.5 % (ref 11.5–15.5)
WBC: 4.4 10*3/uL (ref 4.0–10.5)

## 2019-03-02 LAB — COMPREHENSIVE METABOLIC PANEL
ALT: 22 U/L (ref 0–53)
AST: 25 U/L (ref 0–37)
Albumin: 4.3 g/dL (ref 3.5–5.2)
Alkaline Phosphatase: 52 U/L (ref 39–117)
BUN: 22 mg/dL (ref 6–23)
CO2: 30 mEq/L (ref 19–32)
Calcium: 10.1 mg/dL (ref 8.4–10.5)
Chloride: 102 mEq/L (ref 96–112)
Creatinine, Ser: 1.15 mg/dL (ref 0.40–1.50)
GFR: 62.74 mL/min (ref >60.00)
Glucose, Bld: 105 mg/dL — ABNORMAL HIGH (ref 70–99)
Potassium: 4.3 mEq/L (ref 3.5–5.1)
Sodium: 140 mEq/L (ref 135–145)
Total Bilirubin: 0.8 mg/dL (ref 0.2–1.2)
Total Protein: 6.7 g/dL (ref 6.0–8.3)

## 2019-03-02 LAB — VITAMIN D 25 HYDROXY (VIT D DEFICIENCY, FRACTURES): VITD: 41.57 ng/mL (ref 30.00–100.00)

## 2019-03-02 LAB — LIPID PANEL
Cholesterol: 205 mg/dL — ABNORMAL HIGH (ref 0–200)
HDL: 39.7 mg/dL (ref >39.00)
NonHDL: 165.09
Total CHOL/HDL Ratio: 5
Triglycerides: 237 mg/dL — ABNORMAL HIGH (ref 0.0–149.0)
VLDL: 47.4 mg/dL — ABNORMAL HIGH (ref 0.0–40.0)

## 2019-03-02 LAB — FOLATE: Folate: >24.7 ng/mL (ref >5.9)

## 2019-03-02 LAB — LDL CHOLESTEROL, DIRECT: Direct LDL: 128 mg/dL

## 2019-03-02 LAB — VITAMIN B12: Vitamin B-12: 267 pg/mL (ref 211–911)

## 2019-03-02 LAB — PSA: PSA: 8.92 ng/mL — ABNORMAL HIGH (ref 0.10–4.00)

## 2019-03-02 LAB — TSH: TSH: 2.17 u[IU]/mL (ref 0.35–4.50)

## 2019-03-02 NOTE — Progress Notes (Signed)
psa

## 2019-03-02 NOTE — Addendum Note (Signed)
Addended by: Agnes Lawrence on: 03/02/2019 09:19 AM   Modules accepted: Orders

## 2019-03-02 NOTE — Addendum Note (Signed)
Addended by: Elmer Picker on: 03/02/2019 08:52 AM   Modules accepted: Orders

## 2019-03-02 NOTE — Progress Notes (Signed)
Bryan [name] DOB: 1945/11/17 Encounter date: 03/02/2019  This is a 73 y.o. male who presents for complete physical   History of present illness/Additional concerns: Last seen by me 03/2018 for transition of care.   HTN: lisinopril hcts 20-25 daily:not checking at home.   Back pain/arthritis: sees Dr. Sherwood Gambler later this month. Has had 3 different surgeries. Most recent was last May with 3 disc fusion (followed by hip replacement). Doesn't feel like back is better than it was before. Still trying to stay active - swims frequently, plays golf (pays for it later). Gets massages. Still with sciatic pain down right leg even after doing prednisone burst. Never goes completely away for him. Once he is up moving he does ok, but sitting, laying, standing too long it bothers him. Pain is all the way down to ankle.   Follows with urology for elevated PSA. Has follow up appointment in October. Last time he had it checked was in April - had shot up to 14 at that time. Repeated in June and it had improved to about half. Has had at least a couple of biopsies. Has also been evaluated for blood in urine. This was evaluated with cystoscopy.   Following w derm q 6 months (Dr. Ronnald Ramp)   Past Medical History:  Diagnosis Date  . Anxiety   . Cancer ([place])    facial skin cancer  . Depression   . GERD (gastroesophageal reflux disease)    takes tums rarely  . Hematuria    1 time in july 2019  . Hypertension   . Osteoarthritis   . Spinal stenosis    Past Surgical History:  Procedure Laterality Date  . ACHILLES TENDON SURGERY  02/2006  . ANTERIOR LAT LUMBAR FUSION N/A 11/11/2017   Procedure: LUMBAR TWO- LUMBAR THREE, LUMBAR THREE- LUMBAR FOUR, LUMBAR FOUR- LUMBAR FIVE ANTERIOLATERAL INTERBODY ARTHRODESIS;  Surgeon: Jovita Gamma, MD;  Location: [facility];  Service: Neurosurgery;  Laterality: N/A;  . COLONOSCOPY  2003  . COLONOSCOPY    . CYSTOSCOPY N/A 05/21/2018   Procedure: CYSTOSCOPY FLEXIBLE;  Surgeon:  Festus Aloe, MD;  Location: [facility];  Service: Urology;  Laterality: N/A;  . KNEE SURGERY Left 1991 and 2001   X2 arthroscopy  . LUMBAR PERCUTANEOUS PEDICLE SCREW 3 LEVEL N/A 11/11/2017   Procedure: LUMBAR PERCUTANEOUS PEDICLE SCREW PLACEMENT LUMBAR TWO-THREE, LUMBER THREE-FOUR, LUMBAR FOUR-FIVE;  Surgeon: Jovita Gamma, MD;  Location: [facility];  Service: Neurosurgery;  Laterality: N/A;  . SPINE SURGERY  12-08, 5-10  . TOTAL HIP ARTHROPLASTY     oct 2006,  . TOTAL HIP ARTHROPLASTY Right 02/09/2018   Procedure: RIGHT TOTAL HIP ARTHROPLASTY ANTERIOR APPROACH;  Surgeon: Paralee Cancel, MD;  Location: [facility];  Service: Orthopedics;  Laterality: Right;   Allergies  Allergen Reactions  . Codeine Other (See Comments)    "made me sick"   Current Meds  Medication Sig  . acetaminophen (TYLENOL) 325 MG tablet Take 650 mg by mouth every 6 (six) hours as needed.  Marland Kitchen aspirin EC 81 MG tablet Take 81 mg by mouth daily.  Marland Kitchen b complex vitamins tablet Take 1 tablet by mouth daily.  . Cholecalciferol (VITAMIN D3) 2000 units TABS Take by mouth.  . Ginger, Zingiber officinalis, (GINGER PO) Take by mouth.  Marland Kitchen lisinopril-hydrochlorothiazide (ZESTORETIC) 20-25 MG tablet TAKE (1) TABLET DAILY.  . meloxicam (MOBIC) 15 MG tablet TAKE 1 TABLET ONCE DAILY.  . Multiple Vitamin (MULTIVITAMIN) capsule Take 1 capsule by mouth daily.  . Multiple  Vitamins-Minerals (HAIR SKIN AND NAILS FORMULA PO) Take by mouth.  Marland Kitchen tiZANidine (ZANAFLEX) 4 MG tablet TAKE 1 TABLET THREE TIMES DAILY AS NEEDED FOR MUSCLE SPASMS.  Marland Kitchen TURMERIC PO Take by mouth.   Social History   Tobacco Use  . Smoking status: Never Smoker  . Smokeless tobacco: Never Used  Substance Use Topics  . Alcohol use: No   Family History  Problem Relation Age of Onset  . Diabetes Mother   . Coronary artery disease Mother 56  . Hypertension Mother   . Other Father 40  . Colon polyps Neg Hx   . Esophageal cancer Neg Hx   . Rectal cancer  Neg Hx   . Stomach cancer Neg Hx      Review of Systems  CBC:  Lab Results  Component Value Date   WBC 11.8 (H) 02/10/2018   HGB 9.4 (L) 02/10/2018   HCT 28.8 (L) 02/10/2018   MCH 29.9 02/10/2018   MCHC 32.6 02/10/2018   RDW 12.9 02/10/2018   PLT 178 02/10/2018   MPV 7.2 07/29/2015   CMP: Lab Results  Component Value Date   NA 140 02/10/2018   K 4.5 02/10/2018   CL 106 02/10/2018   CO2 27 02/10/2018   ANIONGAP 7 02/10/2018   GLUCOSE 134 (H) 02/10/2018   GLUCOSE 98 05/26/2006   BUN 22 02/10/2018   CREATININE 0.93 02/10/2018   GFRAA >60 02/10/2018   CALCIUM 9.0 02/10/2018   PROT 6.1 01/14/2017   BILITOT 1.1 01/14/2017   ALKPHOS 48 01/14/2017   ALT 15 01/14/2017   AST 19 01/14/2017   LIPID: Lab Results  Component Value Date   CHOL 183 01/25/2018   TRIG 216.0 (H) 01/25/2018   TRIG 95 05/26/2006   HDL 38.30 (L) 01/25/2018   LDLCALC 110 (H) 01/14/2017    Objective:  BP 102/82 (BP Location: Left Arm, Patient Position: Sitting, Cuff Size: Large)   Pulse 73   Temp (!) 97.1 F (36.2 C) (Temporal)   Ht 6' 3.5" (1.918 m)   Wt 213 lb 9.6 oz (96.9 kg)   SpO2 97%   BMI 26.35 kg/m   Weight: 213 lb 9.6 oz (96.9 kg)   BP Readings from Last 3 Encounters:  03/02/19 102/82  05/21/18 129/63  04/12/18 122/62   Wt Readings from Last 3 Encounters:  03/02/19 213 lb 9.6 oz (96.9 kg)  05/21/18 214 lb 14.4 oz (97.5 kg)  04/12/18 208 lb 12.8 oz (94.7 kg)    Physical Exam Constitutional:      General: He is not in acute distress.    Appearance: He is well-developed.  HENT:     Head: Normocephalic and atraumatic.     Right Ear: External ear normal.     Left Ear: External ear normal.     Nose: Nose normal.     Mouth/Throat:     Pharynx: No oropharyngeal exudate.  Eyes:     Conjunctiva/sclera: Conjunctivae normal.     Pupils: Pupils are equal, round, and reactive to light.  Neck:     Musculoskeletal: Neck supple.     Thyroid: No thyromegaly.  Cardiovascular:      Rate and Rhythm: Normal rate and regular rhythm.     Heart sounds: Normal heart sounds. No murmur. No friction rub. No gallop.   Pulmonary:     Effort: Pulmonary effort is normal. No respiratory distress.     Breath sounds: Normal breath sounds. No stridor. No wheezing or rales.  Abdominal:  General: Bowel sounds are normal.     Palpations: Abdomen is soft.     Hernia: There is no hernia in the left inguinal area or right inguinal area.  Genitourinary:    Penis: Normal.      Scrotum/Testes:        Right: Mass or tenderness not present.        Left: Mass, tenderness or swelling not present.     Epididymis:     Right: Normal.     Left: Normal.  Musculoskeletal: Normal range of motion.  Lymphadenopathy:     Lower Body: No right inguinal adenopathy. No left inguinal adenopathy.  Skin:    General: Skin is warm and dry.  Neurological:     Mental Status: He is alert and oriented to person, place, and time.  Psychiatric:        Behavior: Behavior normal.        Thought Content: Thought content normal.        Judgment: Judgment normal.     Assessment/Plan: Health Maintenance Due  Topic Date Due  . INFLUENZA VACCINE  01/15/2019   Health Maintenance reviewed.  1. Preventative health care Flu shot given today. UTD with preventative health measures.   2. Essential hypertension Controlled. Continue current meds. - CBC with Differential/Platelet; Future - Comprehensive metabolic panel; Future  3. Osteoarthritis, unspecified osteoarthritis type, unspecified site Has follow up with back specialist this month. Still has ongoing discomfort but is staying active which helps.   4. PROSTATE SPECIFIC ANTIGEN, ELEVATED Follows with urology. Prefers them to order labs.  5. Neuropathy - TSH; Future - Vitamin B12; Future - Folate; Future  6. Vitamin D deficiency - VITAMIN D 25 Hydroxy (Vit-D Deficiency, Fractures); Future  7. Lipid screening - Lipid panel; Future  8. B12  deficiency - Vitamin B12; Future - Folate; Future  9. Groin pain, right Not reproducible on exam. If worsening due to area, would refer to gen surg for evaluation.   Return in about 6 months (around 08/30/2019) for Chronic condition visit.  Micheline Rough, MD

## 2019-03-18 DIAGNOSIS — R350 Frequency of micturition: Secondary | ICD-10-CM | POA: Diagnosis not present

## 2019-03-28 ENCOUNTER — Other Ambulatory Visit: Payer: Self-pay | Admitting: Family Medicine

## 2019-03-28 ENCOUNTER — Telehealth: Payer: Self-pay

## 2019-03-28 DIAGNOSIS — R1031 Right lower quadrant pain: Secondary | ICD-10-CM

## 2019-03-28 NOTE — Telephone Encounter (Signed)
Copied from [place] [id number]. Topic: General - Other >> Mar 28, 2019 10:09 AM Carolyn Stare wrote: Pt call to say he was told to call back if he was still having the abd muscle pull and he would be referred to someone

## 2019-03-28 NOTE — Telephone Encounter (Signed)
On his last visit, I documented that due to area of pain (inguinal) I would refer to gen surgery for evaluation if still having pain. I am ok with this referral if this is what he is wanting.   If he feels like discomfort has changed at all then let me know. It is possible to have pulled muscle causing issue as well. I could not reproduce pain when he was in office. If he has more info about when pain occurs and how that might help a little.

## 2019-03-28 NOTE — Telephone Encounter (Signed)
I left a message for the pt to return my call.  CRM also created. 

## 2019-03-28 NOTE — Telephone Encounter (Signed)
Patient called back and stated the pain is the same from the previous visit.  Patient is aware the referral was placed and someone will call with appt info.

## 2019-04-04 ENCOUNTER — Ambulatory Visit: Payer: Self-pay | Admitting: Surgery

## 2019-04-04 DIAGNOSIS — R1031 Right lower quadrant pain: Secondary | ICD-10-CM | POA: Diagnosis not present

## 2019-04-04 DIAGNOSIS — K4090 Unilateral inguinal hernia, without obstruction or gangrene, not specified as recurrent: Secondary | ICD-10-CM | POA: Diagnosis not present

## 2019-04-04 NOTE — H&P (Signed)
Bryan [name] Documented: 04/04/2019 4:16 PM Location: Bryan [name] Patient #: [id number] DOB: 09-28-1945 Single / Language: Bryan [name] / Race: White Male  History of Present Illness Bryan Hector MD; 04/04/2019 5:17 PM) The patient is a 73 year old male who presents with inguinal pain. Note for "Inguinal pain": ` ` ` Patient sent for surgical consultation at the request of Bryan Rough, MD  Chief Complaint: Right groin and inner thigh pain. Possible hernia ` ` The patient is a active male that struggle with chronic back pain. He's required lower spine [name] for slipped disc. Spinal fusion in 2019. Struggled with right hip pain and had a right hip replacement in 2019 as well. Dr. Alvan [name]. Had some hematuria with cystoscopy some BPH. Urinating better now. PSA being followed.  Patient notes that he had sharp right groin pain after doing some planking exercises months ago. Persistent issue. Concerning him. He likes to play golf but sometimes causes discomfort when he does that. Uncomfortable going from standing or sitting positions. Pain in her thigh. Not so much down the testicle. Not seem consistent with his back or hip pain. He does have some sciatica on the right side. Nothing too severe. Swims every day. Tries to be physically active. Because of the persistent groin pain, surgical consultation offered. Patient as he moves his bowels every day. He does not smoke. No cardiac or pulmonary disease. Kidney stones. No irregular bowels.  (Review of systems as stated in this history (HPI) or in the review of systems. Otherwise all other 12 point ROS are negative) ` ` `   Past Surgical History Bryan [name], CMA; 04/04/2019 4:16 PM) Hip [name] Bilateral. Knee [name] Left.  Diagnostic Studies History Bryan [name], CMA; 04/04/2019 4:16 PM) Colonoscopy 5-10 years ago  Allergies Bryan [name], CMA; 04/04/2019 4:17 PM) No Known Drug  Allergies [04/04/2019]:  Medication History (Bryan [name], CMA; 04/04/2019 4:17 PM) Lisinopril-hydroCHLOROthiazide (20-25MG  Tablet, Oral) Active. Meloxicam (15MG  Tablet, Oral) Active. Medications Reconciled  Social History Bryan [name], CMA; 04/04/2019 4:16 PM) Alcohol use Occasional alcohol use. Caffeine use Tea. No drug use Tobacco use Never smoker.  Family History Bryan [name], [place]; 04/04/2019 4:16 PM) Arthritis Father, Mother. Diabetes Mellitus Mother. Heart Disease Mother. Hypertension Mother.  Other Problems Bryan [name], CMA; 04/04/2019 4:16 PM) Back Pain High blood pressure     Review of Systems (Bryan [name] CMA; 04/04/2019 4:16 PM) General Not Present- Appetite Loss, Chills, Fatigue, Fever, Night Sweats, Weight Gain and Weight Loss. Skin Not Present- Change in Wart/Mole, Dryness, Hives, Jaundice, New Lesions, Non-Healing Wounds, Rash and Ulcer. HEENT Not Present- Earache, Hearing Loss, Hoarseness, Nose Bleed, Oral Ulcers, Ringing in the Ears, Seasonal Allergies, Sinus Pain, Sore Throat, Visual Disturbances, Wears glasses/contact lenses and Yellow Eyes. Respiratory Not Present- Bloody sputum, Chronic Cough, Difficulty Breathing, Snoring and Wheezing. Breast Not Present- Breast Mass, Breast Pain, Nipple Discharge and Skin Changes. Cardiovascular Not Present- Chest Pain, Difficulty Breathing Lying Down, Leg Cramps, Palpitations, Rapid Heart Rate, Shortness of Breath and Swelling of Extremities. Gastrointestinal Present- Abdominal Pain. Not Present- Bloating, Bloody Stool, Change in Bowel Habits, Chronic diarrhea, Constipation, Difficulty Swallowing, Excessive gas, Gets full quickly at meals, Hemorrhoids, Indigestion, Nausea, Rectal Pain and Vomiting. Male Genitourinary Not Present- Blood in Urine, Change in Urinary Stream, Frequency, Impotence, Nocturia, Painful Urination, Urgency and Urine Leakage.  Vitals (Bryan [name] CMA; 04/04/2019  4:17 PM) 04/04/2019 4:16 PM Weight: 215 lb Height: 76in Body Surface Area: 2.28 m Body Mass Index: 26.17 kg/m  Temp.: 98.31F(Oral)  Pulse:  69 (Regular)  BP: 118/82 (Sitting, Left Arm, Standard)        Physical Exam Bryan Hector MD; 04/04/2019 5:00 PM)  General Mental Status-Alert. General Appearance-Not in acute distress, Not Sickly. Orientation-Oriented X3. Hydration-Well hydrated. Voice-Normal.  Integumentary Global Assessment Upon inspection and palpation of skin surfaces of the - Axillae: non-tender, no inflammation or ulceration, no drainage. and Distribution of scalp and body hair is normal. General Characteristics Temperature - normal warmth is noted.  Head and Neck Head-normocephalic, atraumatic with no lesions or palpable masses. Face Global Assessment - atraumatic, no absence of expression. Neck Global Assessment - no abnormal movements, no bruit auscultated on the right, no bruit auscultated on the left, no decreased range of motion, non-tender. Trachea-midline. Thyroid Gland Characteristics - non-tender.  Eye Eyeball - Left-Extraocular movements intact, No Nystagmus - Left. Eyeball - Right-Extraocular movements intact, No Nystagmus - Right. Cornea - Left-No Hazy - Left. Cornea - Right-No Hazy - Right. Sclera/Conjunctiva - Left-No scleral icterus, No Discharge - Left. Sclera/Conjunctiva - Right-No scleral icterus, No Discharge - Right. Pupil - Left-Direct reaction to light normal. Pupil - Right-Direct reaction to light normal.  ENMT Ears Pinna - Left - no drainage observed, no generalized tenderness observed. Pinna - Right - no drainage observed, no generalized tenderness observed. Nose and Sinuses External Inspection of the Nose - no destructive lesion observed. Inspection of the nares - Left - quiet respiration. Inspection of the nares - Right - quiet respiration. Mouth and Throat Lips - Upper Lip - no  fissures observed, no pallor noted. Lower Lip - no fissures observed, no pallor noted. Nasopharynx - no discharge present. Oral Cavity/Oropharynx - Tongue - no dryness observed. Oral Mucosa - no cyanosis observed. Hypopharynx - no evidence of airway distress observed.  Chest and Lung Exam Inspection Movements - Normal and Symmetrical. Accessory muscles - No use of accessory muscles in breathing. Palpation Palpation of the chest reveals - Non-tender. Auscultation Breath sounds - Normal and Clear.  Cardiovascular Auscultation Rhythm - Regular. Murmurs & Other Heart Sounds - Auscultation of the heart reveals - No Murmurs and No Systolic Clicks.  Abdomen Inspection Inspection of the abdomen reveals - No Visible peristalsis and No Abnormal pulsations. Umbilicus - No Bleeding, No Urine drainage. Palpation/Percussion Palpation and Percussion of the abdomen reveal - Soft, Non Tender, No Rebound tenderness, No Rigidity (guarding) and No Cutaneous hyperesthesia. Note: Abdomen soft. Nontender. Not distended. No umbilical or incisional hernias. No guarding.  Male Genitourinary Sexual Maturity Tanner 5 - Adult hair pattern and Adult penile size and shape. Note: Mild right groin bulging. Large external inguinal rings with subtle impulse on right side with cough/Valsalva strain. Suspicious for at least right inguinal hernia. Mild sensitivity on left. Mild impulse in right proximal inner thigh near femoral canal suspicious for femoral hernia as well.  Peripheral Vascular Upper Extremity Inspection - Left - No Cyanotic nailbeds - Left, Not Ischemic. Inspection - Right - No Cyanotic nailbeds - Right, Not Ischemic.  Neurologic Neurologic evaluation reveals -normal attention span and ability to concentrate, able to name objects and repeat phrases. Appropriate fund of knowledge , normal sensation and normal coordination. Mental Status Affect - not angry, not paranoid. Cranial Nerves-Normal  Bilaterally. Gait-Normal.  Neuropsychiatric Mental status exam performed with findings of-able to articulate well with normal speech/language, rate, volume and coherence, thought content normal with ability to perform basic computations and apply abstract reasoning and no evidence of hallucinations, delusions, obsessions or homicidal/suicidal ideation.  Musculoskeletal Global Assessment Spine, Ribs  and Pelvis - no instability, subluxation or laxity. Right Upper Extremity - no instability, subluxation or laxity.  Lymphatic Head & Neck  General Head & Neck Lymphatics: Bilateral - Description - No Localized lymphadenopathy. Axillary  General Axillary Region: Bilateral - Description - No Localized lymphadenopathy. Femoral & Inguinal  Generalized Femoral & Inguinal Lymphatics: Left - Description - No Localized lymphadenopathy. Right - Description - No Localized lymphadenopathy.    Assessment & Plan Bryan Hector MD; 04/04/2019 5:14 PM)  Bryan [name] PAIN, RIGHT (R10.31) Impression: Right groin and inner thigh pain very suspicious for inguinal or femoral hernia. Stimulated with certain positions and prolonged strenuous activity like planking. Persistent despite pain and other activity. Does not seem consistent with his chronic low back pain issues and not related to his right hip. Separate from his mild sciatica. No new issues or surgeries for over a year in spine and hip issues  I think he would benefit from diagnostic laparoscopy and repair of hernias found. He has rather large external rings already. I suspect he will need bilateral care but we will see. I would lean towards treating this like a sports hernia since he is otherwise very active (Swimming/golfing).   RIGHT INGUINAL HERNIA (K40.90) Impression: Probable right inguinal hernia. Recommended laparoscopic exploration and repair of hernias found   PREOP - ING HERNIA - ENCOUNTER FOR PREOPERATIVE EXAMINATION FOR GENERAL SURGICAL  PROCEDURE (Z01.818)  Current Plans You are being scheduled for [name]- Our schedulers will call you.  You should hear from our office's scheduling department within 5 working days about the location, date, and time of [name]. We try to make accommodations for patient's preferences in scheduling [name], but sometimes the OR schedule or the surgeon's schedule prevents [place] from making those accommodations.  If you have not heard from our office [phone]) in 5 working days, call the office and ask for your surgeon's nurse.  If you have other questions about your diagnosis, plan, or [name], call the office and ask for your surgeon's nurse.  Written instructions provided The anatomy & physiology of the abdominal wall and pelvic floor was discussed. The pathophysiology of hernias in the inguinal and pelvic region was discussed. Natural history risks such as progressive enlargement, pain, incarceration, and strangulation was discussed. Contributors to complications such as smoking, obesity, diabetes, prior [name], etc were discussed.  I feel the risks of no intervention will lead to serious problems that outweigh the operative risks; therefore, I recommended [name] to reduce and repair the hernia. I explained laparoscopic techniques with possible need for an open approach. I noted usual use of mesh to patch and/or buttress hernia repair  Risks such as bleeding, infection, abscess, need for further treatment, heart attack, death, and other risks were discussed. I noted a good likelihood this will help address the problem. Goals of post-operative recovery were discussed as well. Possibility that this will not correct all symptoms was explained. I stressed the importance of low-impact activity, aggressive pain control, avoiding constipation, & not pushing through pain to minimize risk of post-operative chronic pain or injury. Possibility of reherniation was discussed. We will work to  minimize complications.  An educational handout further explaining the pathology & treatment options was given as well. Questions were answered. The patient expresses understanding & wishes to proceed with [name].  Bryan Education - Bryan Given - Laparoscopic Hernia Repair: discussed with patient and provided information. Bryan Education - CCS Pain Control (Naseem Varden) Bryan Education - [facility] Hernia Post-Op HCI (Jachin Coury): discussed with patient and  provided information. Bryan Education - CCS Mesh education: discussed with patient and provided information.  Bryan Hector, MD, FACS, MASCRS Gastrointestinal and Minimally Invasive [name]  [facility] [name] [address], [place], [place] [address] [phone] Main / Paging [phone] Fax

## 2019-04-13 ENCOUNTER — Other Ambulatory Visit: Payer: Self-pay | Admitting: Family Medicine

## 2019-04-29 ENCOUNTER — Other Ambulatory Visit: Payer: Self-pay | Admitting: Surgery

## 2019-04-29 DIAGNOSIS — R1031 Right lower quadrant pain: Secondary | ICD-10-CM | POA: Diagnosis not present

## 2019-05-07 ENCOUNTER — Ambulatory Visit
Admission: RE | Admit: 2019-05-07 | Discharge: 2019-05-07 | Disposition: A | Discharge status: Home / Self Care | Payer: Medicare Other | Source: Ambulatory Visit | Attending: Surgery | Admitting: Surgery

## 2019-05-07 ENCOUNTER — Other Ambulatory Visit: Payer: Self-pay

## 2019-05-07 DIAGNOSIS — R1031 Right lower quadrant pain: Secondary | ICD-10-CM

## 2019-05-07 MED ORDER — GADOBENATE DIMEGLUMINE 529 MG/ML IV SOLN
20.0000 mL | Freq: Once | INTRAVENOUS | Status: AC | PRN
  Administered 2019-05-07: 20 mL via INTRAVENOUS

## 2019-05-13 ENCOUNTER — Other Ambulatory Visit: Payer: Self-pay | Admitting: Family Medicine

## 2019-05-18 NOTE — Telephone Encounter (Signed)
Patient is calling to check the status of his refill request for Lisinipril.  Pharmacy still does not have it and patient is all out of medication.  Please advise.

## 2019-05-18 NOTE — Telephone Encounter (Signed)
Last OV 03/02/19 Last refill 02/09/19 # 90/0 Next OV 08/31/19

## 2019-05-18 NOTE — Telephone Encounter (Signed)
See request °

## 2019-05-23 ENCOUNTER — Other Ambulatory Visit: Payer: Self-pay | Admitting: Family Medicine

## 2019-05-25 ENCOUNTER — Other Ambulatory Visit: Payer: Medicare Other

## 2019-07-10 ENCOUNTER — Ambulatory Visit: Payer: Medicare Other | Attending: Internal Medicine

## 2019-07-10 DIAGNOSIS — Z23 Encounter for immunization: Secondary | ICD-10-CM

## 2019-07-10 NOTE — Progress Notes (Signed)
   Covid-19 Vaccination Clinic  Name:  Bryan [name]    MRN: [id number] DOB: 10/30/1945  07/10/2019  [name] was observed post Covid-19 immunization for 15 minutes without incidence. He was provided with Vaccine Information Sheet and instruction to access the V-Safe system.   [name] was instructed to call 911 with any severe reactions post vaccine: Marland Kitchen Difficulty breathing  . Swelling of your face and throat  . A fast heartbeat  . A bad rash all over your body  . Dizziness and weakness    Immunizations Administered    Name Date Dose VIS Date Route   Pfizer COVID-19 Vaccine 07/10/2019  2:59 PM 0.3 mL 05/27/2019 Intramuscular   Manufacturer: [place]   Lot: [id number]   [place]: [id number]

## 2019-07-11 ENCOUNTER — Other Ambulatory Visit: Payer: Self-pay | Admitting: Family Medicine

## 2019-07-12 DIAGNOSIS — L570 Actinic keratosis: Secondary | ICD-10-CM | POA: Diagnosis not present

## 2019-07-27 DIAGNOSIS — H02831 Dermatochalasis of right upper eyelid: Secondary | ICD-10-CM | POA: Diagnosis not present

## 2019-07-27 DIAGNOSIS — H40013 Open angle with borderline findings, low risk, bilateral: Secondary | ICD-10-CM | POA: Diagnosis not present

## 2019-07-27 DIAGNOSIS — H2513 Age-related nuclear cataract, bilateral: Secondary | ICD-10-CM | POA: Diagnosis not present

## 2019-07-27 DIAGNOSIS — D3132 Benign neoplasm of left choroid: Secondary | ICD-10-CM | POA: Diagnosis not present

## 2019-07-27 DIAGNOSIS — H02834 Dermatochalasis of left upper eyelid: Secondary | ICD-10-CM | POA: Diagnosis not present

## 2019-08-01 ENCOUNTER — Ambulatory Visit: Payer: Medicare Other | Attending: Internal Medicine

## 2019-08-01 DIAGNOSIS — Z23 Encounter for immunization: Secondary | ICD-10-CM

## 2019-08-01 NOTE — Progress Notes (Signed)
   Covid-19 Vaccination Clinic  Name:  Bryan [name]    MRN: [id number] DOB: 02-19-1946  08/01/2019  [name] was observed post Covid-19 immunization for 15 minutes without incidence. He was provided with Vaccine Information Sheet and instruction to access the V-Safe system.   [name] was instructed to call 911 with any severe reactions post vaccine: Marland Kitchen Difficulty breathing  . Swelling of your face and throat  . A fast heartbeat  . A bad rash all over your body  . Dizziness and weakness    Immunizations Administered    Name Date Dose VIS Date Route   Pfizer COVID-19 Vaccine 08/01/2019  8:46 AM 0.3 mL 05/27/2019 Intramuscular   Manufacturer: [place]   Lot: [id number]   [place]: [id number]

## 2019-08-15 ENCOUNTER — Other Ambulatory Visit: Payer: Self-pay | Admitting: Family Medicine

## 2019-08-16 ENCOUNTER — Other Ambulatory Visit: Payer: Self-pay

## 2019-08-16 NOTE — Patient Outreach (Signed)
[facility]) Care Management  08/16/2019  Bryan [name] [birth year] [id number]   Medication Adherence call to Mr. Bryan [name] HIPPA Compliant Voice message left with a call back number. Mr. Bryan [name] is showing past due on Lisinopril/Hctz 20/25 mg under [place].  [facility] Management Direct Dial [phone]  Fax [phone] [name]

## 2019-08-30 ENCOUNTER — Other Ambulatory Visit: Payer: Self-pay

## 2019-08-31 ENCOUNTER — Encounter: Payer: Self-pay | Admitting: Family Medicine

## 2019-08-31 ENCOUNTER — Ambulatory Visit (INDEPENDENT_AMBULATORY_CARE_PROVIDER_SITE_OTHER): Payer: Medicare Other | Admitting: Family Medicine

## 2019-08-31 VITALS — BP 102/80 | HR 72 | Temp 97.9°F | Ht 75.5 in | Wt 214.7 lb

## 2019-08-31 DIAGNOSIS — M48062 Spinal stenosis, lumbar region with neurogenic claudication: Secondary | ICD-10-CM

## 2019-08-31 DIAGNOSIS — R972 Elevated prostate specific antigen [PSA]: Secondary | ICD-10-CM | POA: Diagnosis not present

## 2019-08-31 DIAGNOSIS — M1990 Unspecified osteoarthritis, unspecified site: Secondary | ICD-10-CM | POA: Diagnosis not present

## 2019-08-31 DIAGNOSIS — E785 Hyperlipidemia, unspecified: Secondary | ICD-10-CM | POA: Diagnosis not present

## 2019-08-31 DIAGNOSIS — R1031 Right lower quadrant pain: Secondary | ICD-10-CM

## 2019-08-31 DIAGNOSIS — I10 Essential (primary) hypertension: Secondary | ICD-10-CM | POA: Diagnosis not present

## 2019-08-31 LAB — COMPREHENSIVE METABOLIC PANEL
ALT: 19 U/L (ref 0–53)
AST: 23 U/L (ref 0–37)
Albumin: 4.3 g/dL (ref 3.5–5.2)
Alkaline Phosphatase: 54 U/L (ref 39–117)
BUN: 26 mg/dL — ABNORMAL HIGH (ref 6–23)
CO2: 32 mEq/L (ref 19–32)
Calcium: 10.4 mg/dL (ref 8.4–10.5)
Chloride: 99 mEq/L (ref 96–112)
Creatinine, Ser: 1.18 mg/dL (ref 0.40–1.50)
GFR: 60.81 mL/min (ref >60.00)
Glucose, Bld: 102 mg/dL — ABNORMAL HIGH (ref 70–99)
Potassium: 4 mEq/L (ref 3.5–5.1)
Sodium: 137 mEq/L (ref 135–145)
Total Bilirubin: 1.3 mg/dL — ABNORMAL HIGH (ref 0.2–1.2)
Total Protein: 6.7 g/dL (ref 6.0–8.3)

## 2019-08-31 LAB — CBC WITH DIFFERENTIAL/PLATELET
Basophils Absolute: 0 10*3/uL (ref 0.0–0.1)
Basophils Relative: 0.7 % (ref 0.0–3.0)
Eosinophils Absolute: 0.2 10*3/uL (ref 0.0–0.7)
Eosinophils Relative: 4.1 % (ref 0.0–5.0)
HCT: 52.7 % — ABNORMAL HIGH (ref 39.0–52.0)
Hemoglobin: 17.8 g/dL — ABNORMAL HIGH (ref 13.0–17.0)
Lymphocytes Relative: 23.5 % (ref 12.0–46.0)
Lymphs Abs: 1.1 10*3/uL (ref 0.7–4.0)
MCHC: 33.8 g/dL (ref 30.0–36.0)
MCV: 91.9 fl (ref 78.0–100.0)
Monocytes Absolute: 0.5 10*3/uL (ref 0.1–1.0)
Monocytes Relative: 9.8 % (ref 3.0–12.0)
Neutro Abs: 3 10*3/uL (ref 1.4–7.7)
Neutrophils Relative %: 61.9 % (ref 43.0–77.0)
Platelets: 223 10*3/uL (ref 150.0–400.0)
RBC: 5.73 Mil/uL (ref 4.22–5.81)
RDW: 13.1 % (ref 11.5–15.5)
WBC: 4.9 10*3/uL (ref 4.0–10.5)

## 2019-08-31 LAB — PSA: PSA: 8.84 ng/mL — ABNORMAL HIGH (ref 0.10–4.00)

## 2019-08-31 MED ORDER — TIZANIDINE HCL 4 MG PO TABS: ORAL_TABLET | ORAL | 1 refills | Status: DC

## 2019-08-31 NOTE — Addendum Note (Signed)
Addended by: Elmer Picker on: 08/31/2019 08:43 AM   Modules accepted: Orders

## 2019-08-31 NOTE — Progress Notes (Signed)
[name] DOB: 11-Jun-1946 Encounter date: 08/31/2019  This is a 74 y.o. male who presents with Chief Complaint  Patient presents with  . Follow-up    History of present illness:  HTN: lisinopril hcts 20-25 daily:not checking at home.   Back pain/arthritis: sees Dr. Sherwood Gambler.Still trying to stay active - swims frequently, plays golf (pays for it later). Gets massages. Is taking tramadol on occasion - sometimes 1/2 to 1 daily. Sees him next weds.   Follows with urology for elevated PSA. Does see them in April.   Following w derm q 6 months (Dr. Ronnald Ramp)  After last visit saw Dr. Johney Maine (gen surgery) but didn't get good feel from visit. Got second opinion with Dr. Over there who ordered MRI - which confirmed he didn't have hernia. Told him to take it easy for a couple of weeks. Dr. Georgette Dover had good bedside manner. Does ok with water exercise. Was able to play golf last week. At worst is 3/10. But after golf did pay for that activity level. Stretches to bother it somewhat. Laying flat on stomach for massage does seem to aggravate that pelvic area.   Saw Dr. Katy Fitch (eye doc).    Allergies  Allergen Reactions  . Codeine Other (See Comments)    "made me sick"   Current Meds  Medication Sig  . b complex vitamins tablet Take 1 tablet by mouth daily.  . Cholecalciferol (VITAMIN D3) 2000 units TABS Take by mouth.  . Ginger, Zingiber officinalis, (GINGER PO) Take by mouth.  Marland Kitchen lisinopril-hydrochlorothiazide (ZESTORETIC) 20-25 MG tablet TAKE (1) TABLET DAILY.  . meloxicam (MOBIC) 15 MG tablet TAKE 1 TABLET ONCE DAILY.  . Multiple Vitamin (MULTIVITAMIN) capsule Take 1 capsule by mouth daily.  . Multiple Vitamins-Minerals (HAIR SKIN AND NAILS FORMULA PO) Take by mouth.  Marland Kitchen tiZANidine (ZANAFLEX) 4 MG tablet TAKE 1 TABLET THREE TIMES DAILY AS NEEDED FOR MUSCLE SPASMS.  Marland Kitchen traMADol (ULTRAM) 50 MG tablet Take by mouth as needed.  . TURMERIC PO Take by mouth.  . [DISCONTINUED] aspirin EC 81 MG  tablet Take 81 mg by mouth daily.    Review of Systems  Objective:  BP 102/80 (BP Location: Right Arm, Patient Position: Sitting, Cuff Size: Normal)   Pulse 72   Temp 97.9 F (36.6 C) (Temporal)   Ht 6' 3.5" (1.918 m)   Wt 214 lb 11.2 oz (97.4 kg)   BMI 26.48 kg/m   Weight: 214 lb 11.2 oz (97.4 kg)   BP Readings from Last 3 Encounters:  08/31/19 102/80  03/02/19 102/82  05/21/18 129/63   Wt Readings from Last 3 Encounters:  08/31/19 214 lb 11.2 oz (97.4 kg)  03/02/19 213 lb 9.6 oz (96.9 kg)  05/21/18 214 lb 14.4 oz (97.5 kg)    Physical Exam Constitutional:      General: He is not in acute distress.    Appearance: He is well-developed.  Cardiovascular:     Rate and Rhythm: Normal rate and regular rhythm.     Heart sounds: Normal heart sounds. No murmur. No friction rub.  Pulmonary:     Effort: Pulmonary effort is normal. No respiratory distress.     Breath sounds: Normal breath sounds. No wheezing or rales.  Musculoskeletal:     Right lower leg: No edema.     Left lower leg: No edema.     Comments: There is some tenderness right lower abd; increased pain with lung and pelvic tilt.   Neurological:  Mental Status: He is alert and oriented to person, place, and time.  Psychiatric:        Behavior: Behavior normal.     Assessment/Plan 1. Essential hypertension Well controlled; continue current medications.  - Comprehensive metabolic panel; Future - CBC with Differential/Platelet; Future  2. Osteoarthritis, unspecified osteoarthritis type, unspecified site Stable. Stays active.  3. PROSTATE SPECIFIC ANTIGEN, ELEVATED Following with urology; has been stable. - PSA; Future  4. Lumbar stenosis with neurogenic claudication Staying active; following w neurosurg.  5. Dyslipidemia Has been diet controlled.   6. Right groin pain Consider referral to sports med; name given and number for him to call if interested.    Return in about 6 months (around  03/02/2020) for physical exam.    Micheline Rough, MD

## 2019-08-31 NOTE — Patient Instructions (Addendum)
Dr. Karna Dupes [facility] [phone]  Tdap through pharmacy

## 2019-09-06 ENCOUNTER — Telehealth: Payer: Self-pay | Admitting: Family Medicine

## 2019-09-06 DIAGNOSIS — M5136 Other intervertebral disc degeneration, lumbar region: Secondary | ICD-10-CM | POA: Diagnosis not present

## 2019-09-06 DIAGNOSIS — M545 Low back pain: Secondary | ICD-10-CM | POA: Diagnosis not present

## 2019-09-06 DIAGNOSIS — G8929 Other chronic pain: Secondary | ICD-10-CM | POA: Diagnosis not present

## 2019-09-06 DIAGNOSIS — M47816 Spondylosis without myelopathy or radiculopathy, lumbar region: Secondary | ICD-10-CM | POA: Diagnosis not present

## 2019-09-06 NOTE — Telephone Encounter (Signed)
I am ok with this. He just saw me recently; so I do not need another separate visit for this. I do require follow ups q 3 months for controlled substances that are taken regularly, so as long as he is good with this I am ok with prescribing.

## 2019-09-06 NOTE — Telephone Encounter (Signed)
Pt neurosurgeon is retiring and informed pt that his primary care provider would have to prescribe his tramadol moving forward. He understands that he may have to have a appt before it prescribed.

## 2019-09-07 ENCOUNTER — Ambulatory Visit: Payer: Medicare Other | Admitting: Family Medicine

## 2019-09-07 ENCOUNTER — Encounter: Payer: Self-pay | Admitting: Family Medicine

## 2019-09-07 ENCOUNTER — Other Ambulatory Visit: Payer: Self-pay

## 2019-09-07 DIAGNOSIS — R1031 Right lower quadrant pain: Secondary | ICD-10-CM

## 2019-09-07 MED ORDER — VITAMIN D (ERGOCALCIFEROL) 1.25 MG (50000 UNIT) PO CAPS: 50000.0000 [IU] | ORAL_CAPSULE | ORAL | 0 refills | Status: DC

## 2019-09-07 MED ORDER — COLCHICINE 0.6 MG PO TABS: 0.6000 mg | ORAL_TABLET | Freq: Every day | ORAL | 0 refills | Status: DC

## 2019-09-07 NOTE — Patient Instructions (Addendum)
Good to see you.  [place] 5 days Ok to golf a par 3  Ice 20 minutes 2 times daily. Usually after activity and before bed. Exercises 3 times a week.  Once weekly vitamin D for 12 weeks.  Tart cherry extract 1200mg  at night  See me again in 4-6 weeks

## 2019-09-07 NOTE — Progress Notes (Signed)
Bryan [name] Phone: [phone] Subjective:   Bryan [name], am serving as a scribe for Dr. Hulan Saas. This visit occurred during the SARS-CoV-2 public health emergency.  Safety protocols were in place, including screening questions prior to the visit, additional usage of staff PPE, and extensive cleaning of exam room while observing appropriate contact time as indicated for disinfecting solutions.   I'm seeing this patient by the request  of:  Bryan [name], Bryan Berg, MD  CC: right groin pain   [id number]  Bryan [name] is a 74 y.o. male coming in with complaint of right groin pain for one year. Pain is mostly in lower abdomen. Is an active individual. Likes to swim. Pain with lumbar flexion. Uses Tramadol daily for pain. [name] pain with swimming. History of 3 lower back surgeries.  Patient states that symptoms that can be very aggravating.  Still works out on a very regular basis.  Mild tender to palpation.  Even at rest though.  Patient states this has been going on for quite some time now.  Denies any abdominal pain out of the ordinary.  Patient did have an MRI of the pelvis done that was independently visualized by me showing [name] significant hernia.  There was significant artifact though secondary to patient's bilateral hip replacement     Past Medical History:  Diagnosis Date  . Anxiety   . Cancer ([place])    facial skin cancer  . Depression   . GERD (gastroesophageal reflux disease)    takes tums rarely  . Hematuria    1 time in july 2019  . Hypertension   . Osteoarthritis   . Spinal stenosis    Past Surgical History:  Procedure Laterality Date  . ACHILLES TENDON SURGERY  02/2006  . ANTERIOR LAT LUMBAR FUSION N/A 11/11/2017   Procedure: LUMBAR TWO- LUMBAR THREE, LUMBAR THREE- LUMBAR FOUR, LUMBAR FOUR- LUMBAR FIVE ANTERIOLATERAL INTERBODY ARTHRODESIS;  Surgeon: Jovita Gamma, MD;  Location: [place];   Service: Neurosurgery;  Laterality: N/A;  . COLONOSCOPY  2003  . COLONOSCOPY    . CYSTOSCOPY N/A 05/21/2018   Procedure: CYSTOSCOPY FLEXIBLE;  Surgeon: Festus Aloe, MD;  [facility];  Service: Urology;  Laterality: N/A;  . KNEE SURGERY Left 1991 and 2001   X2 arthroscopy  . LUMBAR PERCUTANEOUS PEDICLE SCREW 3 LEVEL N/A 11/11/2017   Procedure: LUMBAR PERCUTANEOUS PEDICLE SCREW PLACEMENT LUMBAR TWO-THREE, LUMBER THREE-FOUR, LUMBAR FOUR-FIVE;  Surgeon: Jovita Gamma, MD;  Location: [place];  Service: Neurosurgery;  Laterality: N/A;  . SPINE SURGERY  12-08, 5-10  . TOTAL HIP ARTHROPLASTY     oct 2006,  . TOTAL HIP ARTHROPLASTY Right 02/09/2018   Procedure: RIGHT TOTAL HIP ARTHROPLASTY ANTERIOR APPROACH;  Surgeon: Paralee Cancel, MD;  Location: [facility];  Service: Orthopedics;  Laterality: Right;   Social History   Socioeconomic History  . Marital status: Married    Spouse name: Not on file  . Number of children: Not on file  . Years of education: Not on file  . Highest education level: Not on file  Occupational History  . Not on file  Tobacco Use  . Smoking status: Never Smoker  . Smokeless tobacco: Never Used  Substance and Sexual Activity  . Alcohol use: [name]  . Drug use: [name]  . Sexual activity: Not on file  Other Topics Concern  . Not on file  Social History Narrative   Divorced, Exercises regularly  Social Determinants of Health   Financial Resource Strain:   . Difficulty of Paying Living Expenses:   Food Insecurity:   . Worried About Charity fundraiser in the Last Year:   . Arboriculturist in the Last Year:   Transportation Needs:   . Film/video editor (Medical):   Marland Kitchen Lack of Transportation (Non-Medical):   Physical Activity:   . Days of Exercise per Week:   . Minutes of Exercise per Session:   Stress:   . Feeling of Stress :   Social Connections:   . Frequency of Communication with Friends and Family:   . Frequency of Social  Gatherings with Friends and Family:   . Attends Religious Services:   . Active Member of Clubs or Organizations:   . Attends Archivist Meetings:   Marland Kitchen Marital Status:    Allergies  Allergen Reactions  . Codeine Other (See Comments)    "made me sick"   Family History  Problem Relation Age of Onset  . Diabetes Mother   . Coronary artery disease Mother 58  . Hypertension Mother   . Other Father 63  . Colon polyps Neg Hx   . Esophageal cancer Neg Hx   . Rectal cancer Neg Hx   . Stomach cancer Neg Hx      Current Outpatient Medications (Cardiovascular):  .  lisinopril-hydrochlorothiazide (ZESTORETIC) 20-25 MG tablet, TAKE (1) TABLET DAILY.   Current Outpatient Medications (Analgesics):  .  meloxicam (MOBIC) 15 MG tablet, TAKE 1 TABLET ONCE DAILY. .  traMADol (ULTRAM) 50 MG tablet, Take by mouth as needed. .  colchicine 0.6 MG tablet, Take 1 tablet (0.6 mg total) by mouth daily.   Current Outpatient Medications (Other):  .  b complex vitamins tablet, Take 1 tablet by mouth daily. .  Cholecalciferol (VITAMIN D3) 2000 units TABS, Take by mouth. .  Ginger, Zingiber officinalis, (GINGER PO), Take by mouth. .  Multiple Vitamin (MULTIVITAMIN) capsule, Take 1 capsule by mouth daily. .  Multiple Vitamins-Minerals (HAIR SKIN AND NAILS FORMULA PO), Take by mouth. Marland Kitchen  tiZANidine (ZANAFLEX) 4 MG tablet, TAKE 1 TABLET THREE TIMES DAILY AS NEEDED FOR MUSCLE SPASMS. Marland Kitchen  TURMERIC PO, Take by mouth. .  Vitamin D, Ergocalciferol, (DRISDOL) 1.25 MG (50000 UNIT) CAPS capsule, Take 1 capsule (50,000 Units total) by mouth every 7 (seven) days.   Reviewed prior external information including notes and imaging from  primary care provider As well as notes that were available from care everywhere and other healthcare systems.  Past medical history, social, surgical and family history all reviewed in electronic medical record.  [name] pertanent information unless stated regarding to the chief  complaint.   Review of Systems:  [name] headache, visual changes, nausea, vomiting, diarrhea, constipation, dizziness, abdominal pain, skin rash, fevers, chills, night sweats, weight loss, swollen lymph nodes, body aches, joint swelling, chest pain, shortness of breath, mood changes. POSITIVE muscle aches  Objective  Blood pressure 118/78, pulse (!) 43, height 6' 3.5" (1.918 m), weight 216 lb (98 kg), SpO2 98 %.   General: [name] apparent distress alert and oriented x3 mood and affect normal, dressed appropriately.  HEENT: Pupils equal, extraocular movements intact  Respiratory: Patient's speak in full sentences and does not appear short of breath  Cardiovascular: [name] lower extremity edema, non tender, [name] erythema  Neuro: Cranial nerves II through XII are intact, neurovascularly intact in all extremities with 2+ DTRs and 2+ pulses.  Gait normal with good balance and  coordination.  MSK:  tender with full range of motion and good stability and symmetric strength and tone of shoulders, elbows, wrist, knee and ankles bilaterally.  Low back exam has significant loss of lordosis.  Tender to palpation of the paraspinous musculature.  Tightness with Corky Sox test right greater than left.  Patient has some mild tightness on the left side as well.  Did not attempt internal range of motion secondary to the hip replacement.  Patient has [name] pain with resisted hip flexion.  [name] pain with Valsalva in the area.  Tender though with to palpation where the hip flexor tendon usually inserts moderate to severe tenderness is noted over the pubic symphysis bone as well  97110; 15 additional minutes spent for Therapeutic exercises as stated in above notes.  This included exercises focusing on stretching, strengthening, with significant focus on eccentric aspects.   Long term goals include an improvement in range of motion, strength, endurance as well as avoiding reinjury. Patient's frequency would include in 1-2 times a day, 3-5 times  a week for a duration of 6-12 weeks.  Hip strengthening exercises which included:  Pelvic tilt/bracing to help with proper recruitment of the lower abs and pelvic floor muscles  Glute strengthening to properly contract glutes without over-engaging low back and hamstrings - prone hip extension and glute bridge exercises Proper stretching techniques to increase effectiveness for the hip flexors, groin, quads, piriformic and low back when appropriate   Proper technique shown and discussed handout in great detail with ATC.  All questions were discussed and answered.     Impression and Recommendations:     This case required medical decision making of moderate complexity. The above documentation has been reviewed and is accurate and complete Bryan Pulley, DO       Note: This dictation was prepared with Dragon dictation along with smaller phrase technology. Any transcriptional errors that result from this process are unintentional.

## 2019-09-07 NOTE — Telephone Encounter (Signed)
Patient called back, was informed of the message below and agreed to follow up as recommended.  Patient stated Dr Sherwood Gambler just gave him 82 pills yesterday and he takes this as needed.  I advised the pt it would be a good idea to schedule an appt in case more medication is needed and an appt was scheduled for 5/24.

## 2019-09-07 NOTE — Telephone Encounter (Signed)
Left a message for the pt to return my call.  

## 2019-09-07 NOTE — Assessment & Plan Note (Signed)
Right groin pain.  Patient when discussing rehab not feels great since the hip replacement.  That was nearly 2 years ago.  Patient continues to have discomfort and pain and tightness.  Did have an MRI of the pelvic area that was independently visualized by me did not show any true hernia.  Patient may have though some mild hyperechoic changes of the psoas tendon that could be contributing.  We discussed that we will give exercises for more of a tendinitis.  Differential includes the possibility of osteitis pubis with patient having discomfort over the pubic bone itself.  5-day burst of colchicine given.  Discussed being able to do low impact exercises.  Follow-up again in 4 to 6 weeks for further evaluation.  Difficult to assess secondary to patient's surgical interventions which includes lumbar stenosis surgery x3 and the bilateral hip replacement

## 2019-09-12 ENCOUNTER — Other Ambulatory Visit: Payer: Self-pay | Admitting: Family Medicine

## 2019-10-04 DIAGNOSIS — R350 Frequency of micturition: Secondary | ICD-10-CM | POA: Diagnosis not present

## 2019-10-12 ENCOUNTER — Other Ambulatory Visit: Payer: Self-pay

## 2019-10-12 ENCOUNTER — Ambulatory Visit: Payer: Medicare Other | Admitting: Family Medicine

## 2019-10-12 ENCOUNTER — Other Ambulatory Visit: Payer: Self-pay | Admitting: Family Medicine

## 2019-10-12 ENCOUNTER — Encounter: Payer: Self-pay | Admitting: Family Medicine

## 2019-10-12 VITALS — BP 120/80 | HR 85 | Ht 75.0 in | Wt 213.0 lb

## 2019-10-12 DIAGNOSIS — M2550 Pain in unspecified joint: Secondary | ICD-10-CM | POA: Diagnosis not present

## 2019-10-12 LAB — SEDIMENTATION RATE: Sed Rate: 1 mm/hr (ref 0–20)

## 2019-10-12 LAB — VITAMIN D 25 HYDROXY (VIT D DEFICIENCY, FRACTURES): VITD: 44.89 ng/mL (ref 30.00–100.00)

## 2019-10-12 LAB — URIC ACID: Uric Acid, Serum: 7.2 mg/dL (ref 4.0–7.8)

## 2019-10-12 LAB — PSA: PSA: 9.47 ng/mL — ABNORMAL HIGH (ref 0.10–4.00)

## 2019-10-12 NOTE — Patient Instructions (Addendum)
Good to see you Labs today Keep doing everything else you are doing See me again in 2-3 months

## 2019-10-12 NOTE — Progress Notes (Signed)
[facility] [address] [place] Phone: [phone] Subjective:   I Bryan [name] am serving as a Education administrator for Dr. Hulan Saas.  This visit occurred during the SARS-CoV-2 public health emergency.  Safety protocols were in place, including screening questions prior to the visit, additional usage of staff PPE, and extensive cleaning of exam room while observing appropriate contact time as indicated for disinfecting solutions.   I'm seeing this patient by the request  of:  Koberlein, Steele Berg, MD  CC: Bilateral hip and groin pain  [id number]   09/07/2019 Right groin pain.  Patient when discussing rehab not feels great since the hip replacement.  That was nearly 2 years ago.  Patient continues to have discomfort and pain and tightness.  Did have an MRI of the pelvic area that was independently visualized by me did not show any true hernia.  Patient may have though some mild hyperechoic changes of the psoas tendon that could be contributing.  We discussed that we will give exercises for more of a tendinitis.  Differential includes the possibility of osteitis pubis with patient having discomfort over the pubic bone itself.  5-day burst of colchicine given.  Discussed being able to do low impact exercises.  Follow-up again in 4 to 6 weeks for further evaluation.  Difficult to assess secondary to patient's surgical interventions which includes lumbar stenosis surgery x3 and the bilateral hip replacement Update 10/12/2019 Bryan [name] is a 74 y.o. male coming in with complaint of right groin pain. Patient states it is doing a little better. Taking meloxicam 1 tablet daily. Also taking 1 Tramadol a day as well as extra strength tylenol. Achy stiffness at the waist line. Exercising daily. Patient states he swims for exercise. Played golf yesterday and experienced some pain.  Patient states able to do daily activities as long as he does take the half pill  of tramadol twice a day with Tylenol and the meloxicam.  Patient denies any new symptoms.  States sleeping fairly comfortably.     Past Medical History:  Diagnosis Date  . Anxiety   . Cancer ([place])    facial skin cancer  . Depression   . GERD (gastroesophageal reflux disease)    takes tums rarely  . Hematuria    1 time in july 2019  . Hypertension   . Osteoarthritis   . Spinal stenosis    Past Surgical History:  Procedure Laterality Date  . ACHILLES TENDON SURGERY  02/2006  . ANTERIOR LAT LUMBAR FUSION N/A 11/11/2017   Procedure: LUMBAR TWO- LUMBAR THREE, LUMBAR THREE- LUMBAR FOUR, LUMBAR FOUR- LUMBAR FIVE ANTERIOLATERAL INTERBODY ARTHRODESIS;  Surgeon: Jovita Gamma, MD;  Location: [place];  Service: Neurosurgery;  Laterality: N/A;  . COLONOSCOPY  2003  . COLONOSCOPY    . CYSTOSCOPY N/A 05/21/2018   Procedure: CYSTOSCOPY FLEXIBLE;  Surgeon: Festus Aloe, MD;  Location: [facility];  Service: Urology;  Laterality: N/A;  . KNEE SURGERY Left 1991 and 2001   X2 arthroscopy  . LUMBAR PERCUTANEOUS PEDICLE SCREW 3 LEVEL N/A 11/11/2017   Procedure: LUMBAR PERCUTANEOUS PEDICLE SCREW PLACEMENT LUMBAR TWO-THREE, LUMBER THREE-FOUR, LUMBAR FOUR-FIVE;  Surgeon: Jovita Gamma, MD;  Location: [place];  Service: Neurosurgery;  Laterality: N/A;  . SPINE SURGERY  12-08, 5-10  . TOTAL HIP ARTHROPLASTY     oct 2006,  . TOTAL HIP ARTHROPLASTY Right 02/09/2018   Procedure: RIGHT TOTAL HIP ARTHROPLASTY ANTERIOR APPROACH;  Surgeon: Paralee Cancel, MD;  Location: [facility];  Service: Orthopedics;  Laterality: Right;   Social History   Socioeconomic History  . Marital status: Married    Spouse name: Not on file  . Number of children: Not on file  . Years of education: Not on file  . Highest education level: Not on file  Occupational History  . Not on file  Tobacco Use  . Smoking status: Never Smoker  . Smokeless tobacco: Never Used  Substance and Sexual Activity  . Alcohol use:  No  . Drug use: No  . Sexual activity: Not on file  Other Topics Concern  . Not on file  Social History Narrative   Divorced, Exercises regularly   Social Determinants of Health   Financial Resource Strain:   . Difficulty of Paying Living Expenses:   Food Insecurity:   . Worried About Charity fundraiser in the Last Year:   . Arboriculturist in the Last Year:   Transportation Needs:   . Film/video editor (Medical):   Marland Kitchen Lack of Transportation (Non-Medical):   Physical Activity:   . Days of Exercise per Week:   . Minutes of Exercise per Session:   Stress:   . Feeling of Stress :   Social Connections:   . Frequency of Communication with Friends and Family:   . Frequency of Social Gatherings with Friends and Family:   . Attends Religious Services:   . Active Member of Clubs or Organizations:   . Attends Archivist Meetings:   Marland Kitchen Marital Status:    Allergies  Allergen Reactions  . Codeine Other (See Comments)    "made me sick"   Family History  Problem Relation Age of Onset  . Diabetes Mother   . Coronary artery disease Mother 56  . Hypertension Mother   . Other Father 108  . Colon polyps Neg Hx   . Esophageal cancer Neg Hx   . Rectal cancer Neg Hx   . Stomach cancer Neg Hx      Current Outpatient Medications (Cardiovascular):  .  lisinopril-hydrochlorothiazide (ZESTORETIC) 20-25 MG tablet, TAKE (1) TABLET DAILY.   Current Outpatient Medications (Analgesics):  .  colchicine 0.6 MG tablet, Take 1 tablet (0.6 mg total) by mouth daily. .  meloxicam (MOBIC) 15 MG tablet, TAKE 1 TABLET ONCE DAILY. .  traMADol (ULTRAM) 50 MG tablet, Take by mouth as needed.   Current Outpatient Medications (Other):  .  b complex vitamins tablet, Take 1 tablet by mouth daily. .  Cholecalciferol (VITAMIN D3) 2000 units TABS, Take by mouth. .  Ginger, Zingiber officinalis, (GINGER PO), Take by mouth. .  Multiple Vitamin (MULTIVITAMIN) capsule, Take 1 capsule by mouth  daily. .  Multiple Vitamins-Minerals (HAIR SKIN AND NAILS FORMULA PO), Take by mouth. Marland Kitchen  tiZANidine (ZANAFLEX) 4 MG tablet, TAKE 1 TABLET THREE TIMES DAILY AS NEEDED FOR MUSCLE SPASMS. Marland Kitchen  TURMERIC PO, Take by mouth. .  Vitamin D, Ergocalciferol, (DRISDOL) 1.25 MG (50000 UNIT) CAPS capsule, Take 1 capsule (50,000 Units total) by mouth every 7 (seven) days.   Reviewed prior external information including notes and imaging from  primary care provider As well as notes that were available from care everywhere and other healthcare systems.  Past medical history, social, surgical and family history all reviewed in electronic medical record.  No pertanent information unless stated regarding to the chief complaint.   Review of Systems:  No headache, visual changes, nausea, vomiting, diarrhea, constipation, dizziness, abdominal pain, skin rash,  fevers, chills, night sweats, weight loss, swollen lymph nodes, body aches, joint swelling, chest pain, shortness of breath, mood changes. POSITIVE muscle aches  Objective  Blood pressure 120/80, pulse 85, height 6\' 3"  (1.905 m), weight 213 lb (96.6 kg), SpO2 94 %.   General: No apparent distress alert and oriented x3 mood and affect normal, dressed appropriately.  HEENT: Pupils equal, extraocular movements intact  Respiratory: Patient's speak in full sentences and does not appear short of breath  Cardiovascular: No lower extremity edema, non tender, no erythema  Neuro: Cranial nerves II through XII are intact, neurovascularly intact in all extremities with 2+ DTRs and 2+ pulses.  Gait normal with good balance and coordination.  MSK:  tender with mild limited range of motion and good stability and symmetric strength and tone of shoulders, elbows, wrist, knee and ankles bilaterally.  Back exam has significant loss of lordosis and loss of range of motion.  Patient's previous surgical scars fairly unremarkable for any signs of infection or erythema.  Patient does  have tightness in the hips minorly especially with internal rotation bilaterally.  External rotation of only 20 degrees bilaterally.  Neurovascular intact distally.  4+ out of 5 strength in lower extremities but symmetric.  Deep tendon reflexes intact   Impression and Recommendations:     This case required medical decision making of moderate complexity. The above documentation has been reviewed and is accurate and complete Lyndal Pulley, DO       Note: This dictation was prepared with Dragon dictation along with smaller phrase technology. Any transcriptional errors that result from this process are unintentional.

## 2019-10-12 NOTE — Assessment & Plan Note (Signed)
Concern that the bilateral hip pain is likely secondary to more of the lumbar stenosis.  Seems to be stable at this moment.  Doing relatively well with the meloxicam, a total of 1 pill of tramadol a day breaking it in half with Tylenol.  Patient is going to follow-up with primary care provider to see if refill is necessary because he would like to continue this.  I would like to get other laboratory work-up to see if gout is potentially playing a role and then we would consider the possibility of allopurinol daily.  Other idea would be to start Singulair secondary to potential low residual metal allergy.  Patient will continue the exercises and working with aquatic therapy on his own.  Follow-up with me again in 2 to 3 months.  Total time with patient and reviewing patient's imaging 32 minutes today

## 2019-10-13 LAB — RHEUMATOID FACTOR: Rheumatoid fact SerPl-aCnc: <14 IU/mL (ref <14)

## 2019-10-13 LAB — ANA: Anti Nuclear Antibody (ANA): NEGATIVE

## 2019-10-14 ENCOUNTER — Telehealth: Payer: Self-pay | Admitting: *Deleted

## 2019-10-14 NOTE — Telephone Encounter (Signed)
Results faxed to [facility]-attn: Dr Junious Silk at [phone].

## 2019-10-14 NOTE — Telephone Encounter (Signed)
-----   Message from Caren Macadam, MD sent at 10/13/2019  9:49 PM EDT ----- Can you forward his most recent PSA in the system to urology please? They are following Bryan [name] for elevated pSA. Thanks!

## 2019-10-16 LAB — TESTOSTERONE, FREE, TOTAL, SHBG
Sex Hormone Binding: 23.4 nmol/L (ref 19.3–76.4)
Testosterone, Free: 6.2 pg/mL — ABNORMAL LOW (ref 6.6–18.1)
Testosterone: 186 ng/dL — ABNORMAL LOW (ref 264–916)

## 2019-10-21 ENCOUNTER — Telehealth: Payer: Self-pay

## 2019-10-21 NOTE — Telephone Encounter (Signed)
Patient called wondering why he hasn't heard about labs he had. Informed patient that Dr. Tamala Julian is out of town this week and that he was getting in touch with his PCP and urologist in regard to his labs. Patient stated understanding but would like a call back Monday to go over lab results

## 2019-10-24 NOTE — Telephone Encounter (Signed)
Talked to patient. States he is still in pain and is taking meloxicam and tramadol. Was wondering if he could get different medication for pain

## 2019-10-24 NOTE — Telephone Encounter (Signed)
Overall everything else lookedf good.  Testosterone is low but I am sure urology would want this with patient having high PSA. PSA continues to go up but he is being monitored by urology and needs to follow u. Everything else I am happy with and normal. NO sign of inflammation or autoimmune disease which is great

## 2019-10-25 MED ORDER — GABAPENTIN 100 MG PO CAPS: 100.0000 mg | ORAL_CAPSULE | Freq: Every day | ORAL | 3 refills | Status: DC

## 2019-10-25 NOTE — Addendum Note (Signed)
Addended by: Lyndal Pulley on: 10/25/2019 07:16 AM   Modules accepted: Orders

## 2019-10-25 NOTE — Telephone Encounter (Signed)
Left message for patient to call back. Wanted to notify him about new medication sent in.

## 2019-10-25 NOTE — Telephone Encounter (Signed)
Patient returned call. He will continue other medications and the gabapentin.

## 2019-11-04 ENCOUNTER — Other Ambulatory Visit: Payer: Self-pay

## 2019-11-07 ENCOUNTER — Other Ambulatory Visit: Payer: Self-pay

## 2019-11-07 ENCOUNTER — Encounter: Payer: Self-pay | Admitting: Family Medicine

## 2019-11-07 ENCOUNTER — Ambulatory Visit (INDEPENDENT_AMBULATORY_CARE_PROVIDER_SITE_OTHER): Payer: Medicare Other | Admitting: Family Medicine

## 2019-11-07 VITALS — BP 110/80 | HR 74 | Temp 98.3°F | Ht 75.0 in | Wt 213.1 lb

## 2019-11-07 DIAGNOSIS — I10 Essential (primary) hypertension: Secondary | ICD-10-CM | POA: Diagnosis not present

## 2019-11-07 DIAGNOSIS — R1030 Lower abdominal pain, unspecified: Secondary | ICD-10-CM | POA: Diagnosis not present

## 2019-11-07 DIAGNOSIS — M48062 Spinal stenosis, lumbar region with neurogenic claudication: Secondary | ICD-10-CM | POA: Diagnosis not present

## 2019-11-07 MED ORDER — TRAMADOL HCL 50 MG PO TABS: 50.0000 mg | ORAL_TABLET | ORAL | 1 refills | Status: DC | PRN

## 2019-11-07 MED ORDER — LIDOCAINE 5 % EX OINT: TOPICAL_OINTMENT | CUTANEOUS | 2 refills | Status: DC

## 2019-11-07 NOTE — Progress Notes (Signed)
Bryan [name] DOB: 1946-04-28 Encounter date: 11/07/2019  This is a 74 y.o. male who presents with Chief Complaint  Patient presents with  . Medication Refill    History of present illness: Feels ok; just same back pain.  Abdominal pain still there - no worse or better. Did follow with Dr. Tamala Julian - still working on this. Has follow up in July with him. Has been on gabapentin for this and has helped with sciatic pain which started. Just to point where he feels he needs the tramadol to function. Takes half at a time and takes with ES tylenol. If he goes to play golf he will take 1 tramadol. Helps him a lot. Better with meloxicam and hot shower in morning. Has to keep moving. Swims regularly. Zanaflex just occasionally at night. Not sure he had improvement with tart cherry, but is continuing with this.   Colchicine didn't help him.   Has continued with vitamin D and will resume OTC type once out of rx.   Has followed with urology for elevated PSA. Numbers have been stable.   Allergies  Allergen Reactions  . Codeine Other (See Comments)    "made me sick"   Current Meds  Medication Sig  . acetaminophen (TYLENOL) 650 MG CR tablet Take 650 mg by mouth every 8 (eight) hours as needed for pain.  Marland Kitchen b complex vitamins tablet Take 1 tablet by mouth daily.  . Cholecalciferol (VITAMIN D3) 2000 units TABS Take by mouth.  . colchicine 0.6 MG tablet Take 1 tablet (0.6 mg total) by mouth daily.  Marland Kitchen gabapentin (NEURONTIN) 100 MG capsule Take 1 capsule (100 mg total) by mouth at bedtime.  . Ginger, Zingiber officinalis, (GINGER PO) Take by mouth.  Marland Kitchen lisinopril-hydrochlorothiazide (ZESTORETIC) 20-25 MG tablet TAKE (1) TABLET DAILY.  . meloxicam (MOBIC) 15 MG tablet TAKE 1 TABLET ONCE DAILY.  Marland Kitchen Misc Natural Products (TART CHERRY ADVANCED PO) Take by mouth.  . Multiple Vitamin (MULTIVITAMIN) capsule Take 1 capsule by mouth daily.  . Multiple Vitamins-Minerals (HAIR SKIN AND NAILS FORMULA PO) Take by  mouth.  Marland Kitchen tiZANidine (ZANAFLEX) 4 MG tablet TAKE 1 TABLET THREE TIMES DAILY AS NEEDED FOR MUSCLE SPASMS.  Marland Kitchen traMADol (ULTRAM) 50 MG tablet Take by mouth as needed.  . TURMERIC PO Take by mouth.  . Vitamin D, Ergocalciferol, (DRISDOL) 1.25 MG (50000 UNIT) CAPS capsule Take 1 capsule (50,000 Units total) by mouth every 7 (seven) days.    Review of Systems  Constitutional: Negative for chills, fatigue and fever.  Respiratory: Negative for cough, chest tightness, shortness of breath and wheezing.   Cardiovascular: Negative for chest pain, palpitations and leg swelling.    Objective:  BP 110/80 (BP Location: Left Arm, Patient Position: Sitting, Cuff Size: Large)   Pulse 74   Temp 98.3 F (36.8 C) (Temporal)   Ht 6\' 3"  (1.905 m)   Wt 213 lb 1.6 oz (96.7 kg)   BMI 26.64 kg/m   Weight: 213 lb 1.6 oz (96.7 kg)   BP Readings from Last 3 Encounters:  11/07/19 110/80  10/12/19 120/80  09/07/19 118/78   Wt Readings from Last 3 Encounters:  11/07/19 213 lb 1.6 oz (96.7 kg)  10/12/19 213 lb (96.6 kg)  09/07/19 216 lb (98 kg)    Physical Exam Constitutional:      General: He is not in acute distress.    Appearance: He is well-developed.  Cardiovascular:     Rate and Rhythm: Normal rate and regular rhythm.  Heart sounds: Normal heart sounds. No murmur. No friction rub.  Pulmonary:     Effort: Pulmonary effort is normal. No respiratory distress.     Breath sounds: Normal breath sounds. No wheezing or rales.  Musculoskeletal:     Right lower leg: No edema.     Left lower leg: No edema.  Neurological:     Mental Status: He is alert and oriented to person, place, and time.  Psychiatric:        Behavior: Behavior normal.     Assessment/Plan  .  1. Lumbar stenosis with neurogenic claudication This is a chronic condition for him.  He is not going to have any more back surgeries.  He does get some relief from the pain medication.  He is working very hard at regular exercise,  stretching to help with pain control.  He is following with sports medicine.  2. Essential hypertension Blood pressures been well controlled.  Continue current medications.  3. Lower abdominal pain He continues to have groin pain.  This started more on the right side, but now he does feel some on the left inguinal with some vague radiation.  It seems to be worse with changes in position.  I told patient I will reach out to Dr. Tamala Julian about this and see what best follow up course will be.  He has had imaging, and I do not suspect that there is a hernia there, but more suspect muscular issue at this point.   Return in about 3 months (around 02/07/2020) for Chronic condition visit.     Micheline Rough, MD

## 2019-11-10 ENCOUNTER — Telehealth: Payer: Self-pay | Admitting: *Deleted

## 2019-11-10 MED ORDER — ALLOPURINOL 100 MG PO TABS: 100.0000 mg | ORAL_TABLET | Freq: Every day | ORAL | 0 refills | Status: DC

## 2019-11-10 NOTE — Telephone Encounter (Signed)
Spoke with the pt and informed him of the message below.  Patient agreed to begin Allopurinol and is aware the Rx was sent to [facility].

## 2019-11-10 NOTE — Addendum Note (Signed)
Addended by: Agnes Lawrence on: 11/10/2019 03:28 PM   Modules accepted: Orders

## 2019-11-10 NOTE — Telephone Encounter (Signed)
-----   Message from Caren Macadam, MD sent at 11/10/2019 10:12 AM EDT ----- Wanted to let them know that I did touch base with Dr. Tamala Julian.  When he sees him next, he will determine if there is any other imaging that he wants to get to see about further helping with the back pain.  He did also still have some interest in [name] giving a trial of allopurinol. While we typically use this for gout control, there might be some benefit with overall pain/inflammation. If he is willing we can start 100mg  daily. Then adjust at future visit if needed/he can also discuss with Dr. Tamala Julian and allow him to adjust if he feels needed. OK to send in 90 day supply if patient willing.  ----- Message ----- From: Lyndal Pulley, DO Sent: 11/07/2019   1:00 PM EDT To: Caren Macadam, MD  It is hard, I guess looking at the back more intensely possibly MRI of the lumbar again and then epidurals. Yes I would try the allopurinol as well  ----- Message ----- From: Caren Macadam, MD Sent: 11/07/2019  12:14 PM EDT To: Lyndal Pulley, DO  Wanted to touch base with you about [name]. He is having that rlq abd pain and now has some left sided pain as well. Feels like "tube of pain". Really only in morning; with standing. Improves with meds. Just wondering what else you might think of with work up; really sounds more muscular to me; can't even reproduce it today. Doesn't seem hip related. No abd abnormality. Worst with standing or transition to standing. Also wanted to touch base re allopurinol. If you think it might be helpful we can start; just let me know.

## 2019-12-26 ENCOUNTER — Encounter: Payer: Self-pay | Admitting: Family Medicine

## 2019-12-26 ENCOUNTER — Other Ambulatory Visit: Payer: Self-pay

## 2019-12-26 ENCOUNTER — Ambulatory Visit: Payer: Medicare Other | Admitting: Family Medicine

## 2019-12-26 DIAGNOSIS — R1031 Right lower quadrant pain: Secondary | ICD-10-CM

## 2019-12-26 DIAGNOSIS — M48062 Spinal stenosis, lumbar region with neurogenic claudication: Secondary | ICD-10-CM

## 2019-12-26 MED ORDER — MONTELUKAST SODIUM 10 MG PO TABS: 10.0000 mg | ORAL_TABLET | Freq: Every day | ORAL | 0 refills | Status: DC

## 2019-12-26 NOTE — Patient Instructions (Signed)
Singulair 10mg  at night Continue allopurinol and all other meds Continue to stay active If not better call us in 6 weeks and we will order a bone scan See me in 2 months

## 2019-12-26 NOTE — Assessment & Plan Note (Signed)
Continues to have groin pain.  Minimal improvement.  Patient is no longer having as much of a lumbar radiculopathy which is improvement.  Discussed medication management.  Started Singulair to see if this will be beneficial.  Discussed home exercises and icing regimen.  Patient has had elevated prostate antigen but is following up with urology.  If no improvement with use either increase allopurinol to 200 or 300 mg as well as possible bone scan to see if there is any loosening of the replacement.  Patient did have an MRI previously but secondary to the artifact of the replacements difficult to see.

## 2019-12-26 NOTE — Assessment & Plan Note (Signed)
Responded well to the gabapentin.  No change in medication at this time.  Getting tramadol from primary care provider

## 2019-12-26 NOTE — Progress Notes (Signed)
[place] [facility] Phone: [phone] Subjective:   Bryan [name], am serving as a scribe for Dr. Hulan Saas. This visit occurred during the SARS-CoV-2 public health emergency.  Safety protocols were in place, including screening questions prior to the visit, additional usage of staff PPE, and extensive cleaning of exam room while observing appropriate contact time as indicated for disinfecting solutions.   I'm seeing this patient by the request  of:  Caren Macadam, MD  CC: Hip pain follow-up  [id number]   10/12/2019 Concern that the bilateral hip pain is likely secondary to more of the lumbar stenosis.  Seems to be stable at this moment.  Doing relatively well with the meloxicam, a total of 1 pill of tramadol a day breaking it in half with Tylenol.  Patient is going to follow-up with primary care provider to see if refill is necessary because he would like to continue this.  I would like to get other laboratory work-up to see if gout is potentially playing a role and then we would consider the possibility of allopurinol daily.  Other idea would be to start Singulair secondary to potential low residual metal allergy.  Patient will continue the exercises and working with aquatic therapy on his own.  Follow-up with me again in 2 to 3 months.  Total time with patient and reviewing patient's imaging 32 minutes today  Update 12/26/2019 Bryan [name] is a 74 y.o. male coming in with complaint of hip and groin pain. Patient states that he continues to have right LQ and groin pain. Sciatic nerve pain is better since starting gabapentin. Patient does not feel like his pain in hips is improving.  Patient has had [name] side effects to the allopurinol at this moment.  He does not know if it is making a significant improvement or not.  He does feel that the gabapentin has helped more of his back pain.    Past Medical History:    Diagnosis Date  . Anxiety   . Cancer ([place])    facial skin cancer  . Depression   . GERD (gastroesophageal reflux disease)    takes tums rarely  . Hematuria    1 time in july 2019  . Hypertension   . Osteoarthritis   . Spinal stenosis    Past Surgical History:  Procedure Laterality Date  . ACHILLES TENDON SURGERY  02/2006  . ANTERIOR LAT LUMBAR FUSION N/A 11/11/2017   Procedure: LUMBAR TWO- LUMBAR THREE, LUMBAR THREE- LUMBAR FOUR, LUMBAR FOUR- LUMBAR FIVE ANTERIOLATERAL INTERBODY ARTHRODESIS;  Surgeon: Jovita Gamma, MD;  Location: [place];  Service: Neurosurgery;  Laterality: N/A;  . COLONOSCOPY  2003  . COLONOSCOPY    . CYSTOSCOPY N/A 05/21/2018   Procedure: CYSTOSCOPY FLEXIBLE;  Surgeon: Festus Aloe, MD;  Location: [facility];  Service: Urology;  Laterality: N/A;  . KNEE SURGERY Left 1991 and 2001   X2 arthroscopy  . LUMBAR PERCUTANEOUS PEDICLE SCREW 3 LEVEL N/A 11/11/2017   Procedure: LUMBAR PERCUTANEOUS PEDICLE SCREW PLACEMENT LUMBAR TWO-THREE, LUMBER THREE-FOUR, LUMBAR FOUR-FIVE;  Surgeon: Jovita Gamma, MD;  Location: [place];  Service: Neurosurgery;  Laterality: N/A;  . SPINE SURGERY  12-08, 5-10  . TOTAL HIP ARTHROPLASTY     oct 2006,  . TOTAL HIP ARTHROPLASTY Right 02/09/2018   Procedure: RIGHT TOTAL HIP ARTHROPLASTY ANTERIOR APPROACH;  Surgeon: Paralee Cancel, MD;  Location: [facility];  Service: Orthopedics;  Laterality: Right;   Social  History   Socioeconomic History  . Marital status: Married    Spouse name: Not on file  . Number of children: Not on file  . Years of education: Not on file  . Highest education level: Not on file  Occupational History  . Not on file  Tobacco Use  . Smoking status: Never Smoker  . Smokeless tobacco: Never Used  Vaping Use  . Vaping Use: Never used  Substance and Sexual Activity  . Alcohol use: [name]  . Drug use: [name]  . Sexual activity: Not on file  Other Topics Concern  . Not on file  Social History Narrative    Divorced, Exercises regularly   Social Determinants of Health   Financial Resource Strain:   . Difficulty of Paying Living Expenses:   Food Insecurity:   . Worried About Charity fundraiser in the Last Year:   . Arboriculturist in the Last Year:   Transportation Needs:   . Film/video editor (Medical):   Marland Kitchen Lack of Transportation (Non-Medical):   Physical Activity:   . Days of Exercise per Week:   . Minutes of Exercise per Session:   Stress:   . Feeling of Stress :   Social Connections:   . Frequency of Communication with Friends and Family:   . Frequency of Social Gatherings with Friends and Family:   . Attends Religious Services:   . Active Member of Clubs or Organizations:   . Attends Archivist Meetings:   Marland Kitchen Marital Status:    Allergies  Allergen Reactions  . Codeine Other (See Comments)    "made me sick"   Family History  Problem Relation Age of Onset  . Diabetes Mother   . Coronary artery disease Mother 54  . Hypertension Mother   . Other Father 59  . Colon polyps Neg Hx   . Esophageal cancer Neg Hx   . Rectal cancer Neg Hx   . Stomach cancer Neg Hx      Current Outpatient Medications (Cardiovascular):  .  lisinopril-hydrochlorothiazide (ZESTORETIC) 20-25 MG tablet, TAKE (1) TABLET DAILY.  Current Outpatient Medications (Respiratory):  .  montelukast (SINGULAIR) 10 MG tablet, Take 1 tablet (10 mg total) by mouth at bedtime.  Current Outpatient Medications (Analgesics):  .  acetaminophen (TYLENOL) 650 MG CR tablet, Take 650 mg by mouth every 8 (eight) hours as needed for pain. Marland Kitchen  allopurinol (ZYLOPRIM) 100 MG tablet, Take 1 tablet (100 mg total) by mouth daily. .  meloxicam (MOBIC) 15 MG tablet, TAKE 1 TABLET ONCE DAILY. .  traMADol (ULTRAM) 50 MG tablet, Take 1 tablet (50 mg total) by mouth as needed.   Current Outpatient Medications (Other):  .  b complex vitamins tablet, Take 1 tablet by mouth daily. .  Cholecalciferol (VITAMIN D3)  2000 units TABS, Take by mouth. .  gabapentin (NEURONTIN) 100 MG capsule, Take 1 capsule (100 mg total) by mouth at bedtime. .  Ginger, Zingiber officinalis, (GINGER PO), Take by mouth. .  lidocaine (XYLOCAINE) 5 % ointment, Apply sparingly up to TID for pain .  Misc Natural Products (TART CHERRY ADVANCED PO), Take by mouth. .  Multiple Vitamin (MULTIVITAMIN) capsule, Take 1 capsule by mouth daily. .  Multiple Vitamins-Minerals (HAIR SKIN AND NAILS FORMULA PO), Take by mouth. Marland Kitchen  tiZANidine (ZANAFLEX) 4 MG tablet, TAKE 1 TABLET THREE TIMES DAILY AS NEEDED FOR MUSCLE SPASMS. Marland Kitchen  TURMERIC PO, Take by mouth. .  Vitamin D, Ergocalciferol, (DRISDOL) 1.25 MG (50000  UNIT) CAPS capsule, Take 1 capsule (50,000 Units total) by mouth every 7 (seven) days.   Reviewed prior external information including notes and imaging from  primary care provider As well as notes that were available from care everywhere and other healthcare systems.  Past medical history, social, surgical and family history all reviewed in electronic medical record.  [name] pertanent information unless stated regarding to the chief complaint.   Review of Systems:  [name] headache, visual changes, nausea, vomiting, diarrhea, constipation, dizziness, abdominal pain, skin rash, fevers, chills, night sweats, weight loss, swollen lymph nodes, body aches, joint swelling, chest pain, shortness of breath, mood changes. POSITIVE muscle aches  Objective  Blood pressure 106/78, pulse 64, height 6\' 3"  (1.905 m), weight 214 lb (97.1 kg), SpO2 99 %.   General: [name] apparent distress alert and oriented x3 mood and affect normal, dressed appropriately.  HEENT: Pupils equal, extraocular movements intact  Respiratory: Patient's speak in full sentences and does not appear short of breath  Cardiovascular: [name] lower extremity edema, non tender, [name] erythema  Neuro: Cranial nerves II through XII are intact, neurovascularly intact in all extremities with 2+ DTRs and  2+ pulses.  Gait very mildly antalgic Patient's right hip does have some mild decreased range of motion compared to the contralateral side.  Patient does have negative straight leg test today but does have tightness of the hamstrings.  Patient still has significant pain over the pubic symphysis    Impression and Recommendations:     The above documentation has been reviewed and is accurate and complete Lyndal Pulley, DO       Note: This dictation was prepared with Dragon dictation along with smaller phrase technology. Any transcriptional errors that result from this process are unintentional.

## 2020-01-09 ENCOUNTER — Ambulatory Visit (INDEPENDENT_AMBULATORY_CARE_PROVIDER_SITE_OTHER): Payer: Medicare Other

## 2020-01-09 ENCOUNTER — Other Ambulatory Visit: Payer: Self-pay

## 2020-01-09 VITALS — BP 110/64 | Temp 98.5°F | Ht 75.0 in | Wt 213.6 lb

## 2020-01-09 DIAGNOSIS — Z0000 Encounter for general adult medical examination without abnormal findings: Secondary | ICD-10-CM

## 2020-01-09 NOTE — Patient Instructions (Signed)
Bryan [name] , Thank you for taking time to come for your Medicare Wellness Visit. I appreciate your ongoing commitment to your health goals. Please review the following plan we discussed and let me know if I can assist you in the future.   Screening recommendations/referrals: Colonoscopy: Up to date, next due 10/26/2022 Recommended yearly ophthalmology/optometry visit for glaucoma screening and checkup Recommended yearly dental visit for hygiene and checkup  Vaccinations: Influenza vaccine: Up to date, next due 01/2020 Pneumococcal vaccine: Completed series  Tdap vaccine: Currently due, may await injury so that it is covered by insurance or check with insurance on cost. Shingles vaccine: Currently due, may check with pharmacy on cost.     Advanced directives: Advance directive discussed with you today. Even though you declined this today please call our office should you change your mind and we can give you the proper paperwork for you to fill out.   Conditions/risks identified: None   Next appointment: 02/13/2020 @ 9:30 am with Dr. Ethlyn Gallery   Preventive Care 18 Years and Older, Male Preventive care refers to lifestyle choices and visits with your health care provider that can promote health and wellness. What does preventive care include?  A yearly physical exam. This is also called an annual well check.  Dental exams once or twice a year.  Routine eye exams. Ask your health care provider how often you should have your eyes checked.  Personal lifestyle choices, including:  Daily care of your teeth and gums.  Regular physical activity.  Eating a healthy diet.  Avoiding tobacco and drug use.  Limiting alcohol use.  Practicing safe sex.  Taking low doses of aspirin every day.  Taking vitamin and mineral supplements as recommended by your health care provider. What happens during an annual well check? The services and screenings done by your health care provider during  your annual well check will depend on your age, overall health, lifestyle risk factors, and family history of disease. Counseling  Your health care provider may ask you questions about your:  Alcohol use.  Tobacco use.  Drug use.  Emotional well-being.  Home and relationship well-being.  Sexual activity.  Eating habits.  History of falls.  Memory and ability to understand (cognition).  Work and work Statistician. Screening  You may have the following tests or measurements:  Height, weight, and BMI.  Blood pressure.  Lipid and cholesterol levels. These may be checked every 5 years, or more frequently if you are over 46 years old.  Skin check.  Lung cancer screening. You may have this screening every year starting at age 79 if you have a 30-pack-year history of smoking and currently smoke or have quit within the past 15 years.  Fecal occult blood test (FOBT) of the stool. You may have this test every year starting at age 75.  Flexible sigmoidoscopy or colonoscopy. You may have a sigmoidoscopy every 5 years or a colonoscopy every 10 years starting at age 43.  Prostate cancer screening. Recommendations will vary depending on your family history and other risks.  Hepatitis C blood test.  Hepatitis B blood test.  Sexually transmitted disease (STD) testing.  Diabetes screening. This is done by checking your blood sugar (glucose) after you have not eaten for a while (fasting). You may have this done every 1-3 years.  Abdominal aortic aneurysm (AAA) screening. You may need this if you are a current or former smoker.  Osteoporosis. You may be screened starting at age 60 if you are  at high risk. Talk with your health care provider about your test results, treatment options, and if necessary, the need for more tests. Vaccines  Your health care provider may recommend certain vaccines, such as:  Influenza vaccine. This is recommended every year.  Tetanus, diphtheria, and  acellular pertussis (Tdap, Td) vaccine. You may need a Td booster every 10 years.  Zoster vaccine. You may need this after age 67.  Pneumococcal 13-valent conjugate (PCV13) vaccine. One dose is recommended after age 23.  Pneumococcal polysaccharide (PPSV23) vaccine. One dose is recommended after age 77. Talk to your health care provider about which screenings and vaccines you need and how often you need them. This information is not intended to replace advice given to you by your health care provider. Make sure you discuss any questions you have with your health care provider. Document Released: 06/29/2015 Document Revised: 02/20/2016 Document Reviewed: 04/03/2015 Elsevier Interactive Patient Education  2017 [place] Prevention in the Home Falls can cause injuries. They can happen to people of all ages. There are many things you can do to make your home safe and to help prevent falls. What can I do on the outside of my home?  Regularly fix the edges of walkways and driveways and fix any cracks.  Remove anything that might make you trip as you walk through a door, such as a raised step or threshold.  Trim any bushes or trees on the path to your home.  Use bright outdoor lighting.  Clear any walking paths of anything that might make someone trip, such as rocks or tools.  Regularly check to see if handrails are loose or broken. Make sure that both sides of any steps have handrails.  Any raised decks and porches should have guardrails on the edges.  Have any leaves, snow, or ice cleared regularly.  Use sand or salt on walking paths during winter.  Clean up any spills in your garage right away. This includes oil or grease spills. What can I do in the bathroom?  Use night lights.  Install grab bars by the toilet and in the tub and shower. Do not use towel bars as grab bars.  Use non-skid mats or decals in the tub or shower.  If you need to sit down in the shower, use  a plastic, non-slip stool.  Keep the floor dry. Clean up any water that spills on the floor as soon as it happens.  Remove soap buildup in the tub or shower regularly.  Attach bath mats securely with double-sided non-slip rug tape.  Do not have throw rugs and other things on the floor that can make you trip. What can I do in the bedroom?  Use night lights.  Make sure that you have a light by your bed that is easy to reach.  Do not use any sheets or blankets that are too big for your bed. They should not hang down onto the floor.  Have a firm chair that has side arms. You can use this for support while you get dressed.  Do not have throw rugs and other things on the floor that can make you trip. What can I do in the kitchen?  Clean up any spills right away.  Avoid walking on wet floors.  Keep items that you use a lot in easy-to-reach places.  If you need to reach something above you, use a strong step stool that has a grab bar.  Keep electrical cords out of  the way.  Do not use floor polish or wax that makes floors slippery. If you must use wax, use non-skid floor wax.  Do not have throw rugs and other things on the floor that can make you trip. What can I do with my stairs?  Do not leave any items on the stairs.  Make sure that there are handrails on both sides of the stairs and use them. Fix handrails that are broken or loose. Make sure that handrails are as long as the stairways.  Check any carpeting to make sure that it is firmly attached to the stairs. Fix any carpet that is loose or worn.  Avoid having throw rugs at the top or bottom of the stairs. If you do have throw rugs, attach them to the floor with carpet tape.  Make sure that you have a light switch at the top of the stairs and the bottom of the stairs. If you do not have them, ask someone to add them for you. What else can I do to help prevent falls?  Wear shoes that:  Do not have high heels.  Have  rubber bottoms.  Are comfortable and fit you well.  Are closed at the toe. Do not wear sandals.  If you use a stepladder:  Make sure that it is fully opened. Do not climb a closed stepladder.  Make sure that both sides of the stepladder are locked into place.  Ask someone to hold it for you, if possible.  Clearly mark and make sure that you can see:  Any grab bars or handrails.  First and last steps.  Where the edge of each step is.  Use tools that help you move around (mobility aids) if they are needed. These include:  Canes.  Walkers.  Scooters.  Crutches.  Turn on the lights when you go into a dark area. Replace any light bulbs as soon as they burn out.  Set up your furniture so you have a clear path. Avoid moving your furniture around.  If any of your floors are uneven, fix them.  If there are any pets around you, be aware of where they are.  Review your medicines with your doctor. Some medicines can make you feel dizzy. This can increase your chance of falling. Ask your doctor what other things that you can do to help prevent falls. This information is not intended to replace advice given to you by your health care provider. Make sure you discuss any questions you have with your health care provider. Document Released: 03/29/2009 Document Revised: 11/08/2015 Document Reviewed: 07/07/2014 Elsevier Interactive Patient Education  2017 [employer].

## 2020-01-09 NOTE — Progress Notes (Addendum)
Subjective:   MURL GOLLADAY is a 74 y.o. male who presents for Medicare Annual/Subsequent preventive examination.  Review of Systems    N/A Cardiac Risk Factors include: advanced age (>33men, >57 women);dyslipidemia;male gender     Objective:    Today's Vitals   01/09/20 0836  BP: (!) 110/64  Temp: 98.5 F (36.9 C)  TempSrc: Oral  Weight: (!) 213 lb 9 oz (96.9 kg)  Height: 6\' 3"  (1.905 m)  PainSc: 3    Body mass index is 26.69 kg/m.  Advanced Directives 01/09/2020 05/21/2018 02/09/2018 02/02/2018 11/30/2017 11/10/2017 09/22/2017  Does Patient Have a Medical Advance Directive? No No No No No No No  Would patient like information on creating a medical advance directive? No - Patient declined No - Patient declined No - Patient declined No - Patient declined No - Patient declined No - Patient declined No - Patient declined    Current Medications (verified) Outpatient Encounter Medications as of 01/09/2020  Medication Sig  . acetaminophen (TYLENOL) 650 MG CR tablet Take 650 mg by mouth every 8 (eight) hours as needed for pain.  Marland Kitchen allopurinol (ZYLOPRIM) 100 MG tablet Take 1 tablet (100 mg total) by mouth daily.  Marland Kitchen b complex vitamins tablet Take 1 tablet by mouth daily.  . Cholecalciferol (VITAMIN D3) 2000 units TABS Take by mouth.  . gabapentin (NEURONTIN) 100 MG capsule Take 1 capsule (100 mg total) by mouth at bedtime.  . Ginger, Zingiber officinalis, (GINGER PO) Take by mouth.  Marland Kitchen lisinopril-hydrochlorothiazide (ZESTORETIC) 20-25 MG tablet TAKE (1) TABLET DAILY.  . meloxicam (MOBIC) 15 MG tablet TAKE 1 TABLET ONCE DAILY.  Marland Kitchen Misc Natural Products (TART CHERRY ADVANCED PO) Take by mouth.  . montelukast (SINGULAIR) 10 MG tablet Take 1 tablet (10 mg total) by mouth at bedtime.  . Multiple Vitamin (MULTIVITAMIN) capsule Take 1 capsule by mouth daily.  . Multiple Vitamins-Minerals (HAIR SKIN AND NAILS FORMULA PO) Take by mouth.  Marland Kitchen tiZANidine (ZANAFLEX) 4 MG tablet TAKE 1 TABLET THREE  TIMES DAILY AS NEEDED FOR MUSCLE SPASMS.  Marland Kitchen traMADol (ULTRAM) 50 MG tablet Take 1 tablet (50 mg total) by mouth as needed.  . TURMERIC PO Take by mouth.  . Vitamin D, Ergocalciferol, (DRISDOL) 1.25 MG (50000 UNIT) CAPS capsule Take 1 capsule (50,000 Units total) by mouth every 7 (seven) days.  Marland Kitchen lidocaine (XYLOCAINE) 5 % ointment Apply sparingly up to TID for pain (Patient not taking: Reported on 01/09/2020)   No facility-administered encounter medications on file as of 01/09/2020.    Allergies (verified) Codeine   History: Past Medical History:  Diagnosis Date  . Anxiety   . Cancer ([place])    facial skin cancer  . Depression   . GERD (gastroesophageal reflux disease)    takes tums rarely  . Hematuria    1 time in july 2019  . Hypertension   . Osteoarthritis   . Spinal stenosis    Past Surgical History:  Procedure Laterality Date  . ACHILLES TENDON SURGERY  02/2006  . ANTERIOR LAT LUMBAR FUSION N/A 11/11/2017   Procedure: LUMBAR TWO- LUMBAR THREE, LUMBAR THREE- LUMBAR FOUR, LUMBAR FOUR- LUMBAR FIVE ANTERIOLATERAL INTERBODY ARTHRODESIS;  Surgeon: Jovita Gamma, MD;  Location: [facility];  Service: Neurosurgery;  Laterality: N/A;  . COLONOSCOPY  2003  . COLONOSCOPY    . CYSTOSCOPY N/A 05/21/2018   Procedure: CYSTOSCOPY FLEXIBLE;  Surgeon: Festus Aloe, MD;  Location[facility];  Service: Urology;  Laterality: N/A;  . KNEE SURGERY Left  1991 and 2001   X2 arthroscopy  . LUMBAR PERCUTANEOUS PEDICLE SCREW 3 LEVEL N/A 11/11/2017   Procedure: LUMBAR PERCUTANEOUS PEDICLE SCREW PLACEMENT LUMBAR TWO-THREE, LUMBER THREE-FOUR, LUMBAR FOUR-FIVE;  Surgeon: Jovita Gamma, MD;  Location: [place];  Service: Neurosurgery;  Laterality: N/A;  . SPINE SURGERY  12-08, 5-10  . TOTAL HIP ARTHROPLASTY     oct 2006,  . TOTAL HIP ARTHROPLASTY Right 02/09/2018   Procedure: RIGHT TOTAL HIP ARTHROPLASTY ANTERIOR APPROACH;  Surgeon: Paralee Cancel, MD;  Location: [facility];  Service: Orthopedics;   Laterality: Right;   Family History  Problem Relation Age of Onset  . Diabetes Mother   . Coronary artery disease Mother 52  . Hypertension Mother   . Other Father 67  . Colon polyps Neg Hx   . Esophageal cancer Neg Hx   . Rectal cancer Neg Hx   . Stomach cancer Neg Hx    Social History   Socioeconomic History  . Marital status: Married    Spouse name: Not on file  . Number of children: Not on file  . Years of education: Not on file  . Highest education level: Not on file  Occupational History  . Not on file  Tobacco Use  . Smoking status: Never Smoker  . Smokeless tobacco: Never Used  Vaping Use  . Vaping Use: Never used  Substance and Sexual Activity  . Alcohol use: No  . Drug use: No  . Sexual activity: Not on file  Other Topics Concern  . Not on file  Social History Narrative   Divorced, Exercises regularly   Social Determinants of Health   Financial Resource Strain: Low Risk   . Difficulty of Paying Living Expenses: Not hard at all  Food Insecurity: No Food Insecurity  . Worried About Charity fundraiser in the Last Year: Never true  . Ran Out of Food in the Last Year: Never true  Transportation Needs: No Transportation Needs  . Lack of Transportation (Medical): No  . Lack of Transportation (Non-Medical): No  Physical Activity: Sufficiently Active  . Days of Exercise per Week: 5 days  . Minutes of Exercise per Session: 50 min  Stress: No Stress Concern Present  . Feeling of Stress : Not at all  Social Connections: Moderately Isolated  . Frequency of Communication with Friends and Family: More than three times a week  . Frequency of Social Gatherings with Friends and Family: Three times a week  . Attends Religious Services: Never  . Active Member of Clubs or Organizations: No  . Attends Archivist Meetings: Never  . Marital Status: Married    Tobacco Counseling Counseling given: Not Answered   Clinical Intake:  Pre-visit preparation  completed: Yes  Pain : 0-10 Pain Score: 3  Pain Type: Chronic pain Pain Location: Back Pain Orientation: Lower Pain Descriptors / Indicators: Aching, Sore Pain Onset: More than a month ago Pain Frequency: Intermittent Pain Relieving Factors: Meloxicam, working out  Pain Relieving Factors: Meloxicam, working out  Nutritional Status: BMI 25 -29 Overweight Nutritional Risks: None Diabetes: No  How often do you need to have someone help you when you read instructions, pamphlets, or other written materials from your doctor or pharmacy?: 1 - Never What is the last grade level you completed in school?: College  Diabetic?No  Interpreter Needed?: No  Information entered by :: [place] of Daily Living In your present state of health, do you have any difficulty performing the following activities:  01/09/2020  Hearing? N  Vision? N  Difficulty concentrating or making decisions? N  Walking or climbing stairs? N  Dressing or bathing? N  Doing errands, shopping? N  Preparing Food and eating ? N  Using the Toilet? N  In the past six months, have you accidently leaked urine? N  Do you have problems with loss of bowel control? N  Managing your Medications? N  Managing your Finances? N  Housekeeping or managing your Housekeeping? N  Some recent data might be hidden    Patient Care Team: Caren Macadam, MD as PCP - General (Family Medicine) Festus Aloe, MD as Consulting Physician (Urology) Danella Sensing, MD as Consulting Physician (Dermatology) Michael Boston, MD as Consulting Physician (General Surgery) Paralee Cancel, MD as Consulting Physician (Orthopedic Surgery) Jovita Gamma, MD as Consulting Physician (Neurosurgery)  Indicate any recent [place] you may have received from other than [facility] providers in the past year (date may be approximate).     Assessment:   This is a routine wellness examination for [place].  Hearing/Vision screen   Hearing Screening   125Hz  250Hz  500Hz  1000Hz  2000Hz  3000Hz  4000Hz  6000Hz  8000Hz   Right ear:           Left ear:           Vision Screening Comments: Gets eyes examined yearly    Dietary issues and exercise activities discussed: Current Exercise Habits: Home exercise routine, Type of exercise: Other - see comments (Swimming), Time (Minutes): 50, Frequency (Times/Week): 5, Weekly Exercise (Minutes/Week): 250, Intensity: Moderate, Exercise limited by: orthopedic condition(s)  Goals    . Patient Stated     I will continue to swim daily.       Depression Screen PHQ 2/9 Scores 01/09/2020 03/02/2019 01/25/2018 01/20/2017 01/18/2016 07/29/2015 10/20/2014  PHQ - 2 Score 0 0 2 0 0 0 1  PHQ- 9 Score 0 - 2 - - - -    Fall Risk Fall Risk  01/09/2020 03/02/2019 01/25/2018 01/20/2017 01/18/2016  Falls in the past year? 0 0 No No No  Number falls in past yr: 0 0 - - -  Injury with Fall? 0 0 - - -  Risk for fall due to : Medication side effect;Orthopedic patient - - - -  Follow up Falls evaluation completed;Falls prevention discussed - - - -    Any stairs in or around the home? Yes  If so, are there any without handrails? No  Home free of loose throw rugs in walkways, pet beds, electrical cords, etc? Yes  Adequate lighting in your home to reduce risk of falls? Yes   ASSISTIVE DEVICES UTILIZED TO PREVENT FALLS:  Life alert? No  Use of a cane, walker or w/c? No  Grab bars in the bathroom? No  Shower chair or bench in shower? No  Elevated toilet seat or a handicapped toilet? No   TIMED UP AND GO:  Was the test performed? Yes .  Length of time to ambulate 10 feet: 5  sec.   Gait steady and fast without use of assistive device  Cognitive Function: Cognition intact by direct observation        Immunizations Immunization History  Administered Date(s) Administered  . Fluad Quad(high Dose 65+) 03/02/2019  . Influenza Split 03/30/2013  . Influenza, High Dose Seasonal PF 03/26/2015, 03/25/2016,  04/13/2017, 03/23/2018  . Influenza,inj,Quad PF,6+ Mos 03/21/2014  . PFIZER SARS-COV-2 Vaccination 07/10/2019, 08/01/2019  . Pneumococcal Conjugate-13 03/21/2014  . Pneumococcal Polysaccharide-23 03/26/2015  . Td 07/19/2009  TDAP status: Due, Education has been provided regarding the importance of this vaccine. Advised may receive this vaccine at local pharmacy or Health Dept. Aware to provide a copy of the vaccination record if obtained from local pharmacy or Health Dept. Verbalized acceptance and understanding. Flu Vaccine status: Up to date Pneumococcal vaccine status: Up to date Covid-19 vaccine status: Completed vaccines  Qualifies for Shingles Vaccine? Yes   Zostavax completed No   Shingrix Completed?: No.    Education has been provided regarding the importance of this vaccine. Patient has been advised to call insurance company to determine out of pocket expense if they have not yet received this vaccine. Advised may also receive vaccine at local pharmacy or Health Dept. Verbalized acceptance and understanding.  Screening Tests Health Maintenance  Topic Date Due  . TETANUS/TDAP  07/20/2019  . Hepatitis C Screening  03/01/2020 (Originally Jan 16, 1946)  . INFLUENZA VACCINE  01/15/2020  . COLONOSCOPY  10/26/2022  . COVID-19 Vaccine  Completed  . PNA vac Low Risk Adult  Completed    Health Maintenance  Health Maintenance Due  Topic Date Due  . TETANUS/TDAP  07/20/2019    Colorectal cancer screening: Completed 10/25/2012. Repeat every 10 years  Lung Cancer Screening: (Low Dose CT Chest recommended if Age 38-80 years, 30 pack-year currently smoking OR have quit w/in 15years.) does not qualify.   Lung Cancer Screening Referral: N/A  Additional Screening:  Hepatitis C Screening: does qualify; Patient to get at next office visit  Vision Screening: Recommended annual ophthalmology exams for early detection of glaucoma and other disorders of the eye. Is the patient up to  date with their annual eye exam?  Yes  Who is the provider or what is the name of the office in which the patient attends annual eye exams? Dr. Katy Fitch If pt is not established with a provider, would they like to be referred to a provider to establish care? No .   Dental Screening: Recommended annual dental exams for proper oral hygiene  Community Resource Referral / Chronic Care Management: CRR required this visit?  No   CCM required this visit?  No      Plan:     I have personally reviewed and noted the following in the patient's chart:   . Medical and social history . Use of alcohol, tobacco or illicit drugs  . Current medications and supplements . Functional ability and status . Nutritional status . Physical activity . Advanced directives . List of other physicians . Hospitalizations, surgeries, and ER visits in previous 12 months . Vitals . Screenings to include cognitive, depression, and falls . Referrals and appointments  In addition, I have reviewed and discussed with patient certain preventive protocols, quality metrics, and best practice recommendations. A written personalized care plan for preventive services as well as general preventive health recommendations were provided to patient.     Ofilia Neas, LPN   [id number]   Nurse Notes: None

## 2020-01-17 DIAGNOSIS — L570 Actinic keratosis: Secondary | ICD-10-CM | POA: Diagnosis not present

## 2020-01-17 DIAGNOSIS — D485 Neoplasm of uncertain behavior of skin: Secondary | ICD-10-CM | POA: Diagnosis not present

## 2020-01-17 DIAGNOSIS — L739 Follicular disorder, unspecified: Secondary | ICD-10-CM | POA: Diagnosis not present

## 2020-02-07 ENCOUNTER — Other Ambulatory Visit: Payer: Self-pay | Admitting: Family Medicine

## 2020-02-13 ENCOUNTER — Encounter: Payer: Self-pay | Admitting: Family Medicine

## 2020-02-13 ENCOUNTER — Other Ambulatory Visit: Payer: Self-pay | Admitting: Family Medicine

## 2020-02-13 ENCOUNTER — Other Ambulatory Visit: Payer: Self-pay

## 2020-02-13 ENCOUNTER — Ambulatory Visit (INDEPENDENT_AMBULATORY_CARE_PROVIDER_SITE_OTHER): Payer: Medicare Other | Admitting: Family Medicine

## 2020-02-13 VITALS — BP 112/64 | HR 70 | Temp 98.2°F | Ht 75.0 in | Wt 213.0 lb

## 2020-02-13 DIAGNOSIS — I10 Essential (primary) hypertension: Secondary | ICD-10-CM | POA: Diagnosis not present

## 2020-02-13 DIAGNOSIS — M1990 Unspecified osteoarthritis, unspecified site: Secondary | ICD-10-CM | POA: Diagnosis not present

## 2020-02-13 DIAGNOSIS — M48062 Spinal stenosis, lumbar region with neurogenic claudication: Secondary | ICD-10-CM

## 2020-02-13 DIAGNOSIS — E785 Hyperlipidemia, unspecified: Secondary | ICD-10-CM

## 2020-02-13 MED ORDER — GABAPENTIN 100 MG PO CAPS: 200.0000 mg | ORAL_CAPSULE | Freq: Every day | ORAL | 1 refills | Status: DC

## 2020-02-13 NOTE — Progress Notes (Signed)
Bryan [name] DOB: Dec 07, 1945 Encounter date: 02/13/2020  This is a 74 y.o. male who presents with Chief Complaint  Patient presents with  . Follow-up    History of present illness: Hypertension: Lisinopril-hydrochlorothiazide 20-25 mg daily. Back pain/groin pain: Space following with Dr. Tamala Julian.  Elevated PSA: Following with urology regularly.  Feels like back has gone in wrong direction. No better than prior to his surgery. Can keep it controlled with medication - has had tramadol, meloxicam, tylenol an hour ago. Feels like he has a whole tube around lower back of pain. Just dealing with pain every day. Just aggravates him. Still exercising regularly. Still swimming. No pain while swimming. Groin doesn't bother him with swimming. But bothers him just to flex forward. Still taking gabapentin but other medications didn't have refills so he stopped. Didn't feel like the allopurinol made a difference for him. Does state that the gabapentin has helped with sciatica right side. Massage q 2 weeks helps.   Plays golf still. Has to medicate before this, but hurts afterwards. Doesn't walk course when he plays.     Allergies  Allergen Reactions  . Codeine Other (See Comments)    "made me sick"   Current Meds  Medication Sig  . acetaminophen (TYLENOL) 650 MG CR tablet Take 650 mg by mouth every 8 (eight) hours as needed for pain.  Marland Kitchen b complex vitamins tablet Take 1 tablet by mouth daily.  . Cholecalciferol (VITAMIN D3) 2000 units TABS Take by mouth.  . gabapentin (NEURONTIN) 100 MG capsule Take 2 capsules (200 mg total) by mouth at bedtime.  . Ginger, Zingiber officinalis, (GINGER PO) Take by mouth.  Marland Kitchen lisinopril-hydrochlorothiazide (ZESTORETIC) 20-25 MG tablet TAKE (1) TABLET DAILY.  . meloxicam (MOBIC) 15 MG tablet TAKE 1 TABLET ONCE DAILY.  . Multiple Vitamin (MULTIVITAMIN) capsule Take 1 capsule by mouth daily.  . Multiple Vitamins-Minerals (HAIR SKIN AND NAILS FORMULA PO) Take by  mouth.  Marland Kitchen tiZANidine (ZANAFLEX) 4 MG tablet TAKE 1 TABLET THREE TIMES DAILY AS NEEDED FOR MUSCLE SPASMS.  Marland Kitchen traMADol (ULTRAM) 50 MG tablet TAKE (1) TABLET AS NEEDED.  Marland Kitchen TURMERIC PO Take by mouth.  . Vitamin D, Ergocalciferol, (DRISDOL) 1.25 MG (50000 UNIT) CAPS capsule Take 1 capsule (50,000 Units total) by mouth every 7 (seven) days.  . [DISCONTINUED] gabapentin (NEURONTIN) 100 MG capsule Take 1 capsule (100 mg total) by mouth at bedtime.    Review of Systems  Constitutional: Negative for chills, fatigue and fever.  Respiratory: Negative for cough, chest tightness, shortness of breath and wheezing.   Cardiovascular: Negative for chest pain, palpitations and leg swelling.    Objective:  BP 112/64 (BP Location: Left Arm, Patient Position: Sitting, Cuff Size: Large)   Pulse 70   Temp 98.2 F (36.8 C) (Oral)   Ht 6\' 3"  (1.905 m)   Wt 213 lb (96.6 kg)   SpO2 98%   BMI 26.62 kg/m   Weight: 213 lb (96.6 kg)   BP Readings from Last 3 Encounters:  02/13/20 112/64  01/09/20 (!) 110/64  12/26/19 106/78   Wt Readings from Last 3 Encounters:  02/13/20 213 lb (96.6 kg)  01/09/20 (!) 213 lb 9 oz (96.9 kg)  12/26/19 214 lb (97.1 kg)    Physical Exam Constitutional:      General: He is not in acute distress.    Appearance: He is well-developed.  Cardiovascular:     Rate and Rhythm: Normal rate and regular rhythm.     Heart sounds: Normal  heart sounds. No murmur heard.  No friction rub.  Pulmonary:     Effort: Pulmonary effort is normal. No respiratory distress.     Breath sounds: Normal breath sounds. No wheezing or rales.  Musculoskeletal:     Right lower leg: No edema.     Left lower leg: No edema.  Neurological:     Mental Status: He is alert and oriented to person, place, and time.  Psychiatric:        Behavior: Behavior normal.     Assessment/Plan  1. Essential hypertension Well controlled. Continue current medication.  - CBC with Differential/Platelet; Future -  Comprehensive metabolic panel; Future  2. Dyslipidemia - Lipid panel; Future  3. Osteoarthritis, unspecified osteoarthritis type, unspecified site We did increase the gabapentin to 200mg  at bedtime. He is getting some relief with this from nerve pain. Continues with groin pain; radiation of nerve pain on right.    Return in about 3 months (around 05/15/2020) for physical exam (bloodwork prior to visit if desired).    Micheline Rough, MD

## 2020-02-13 NOTE — Progress Notes (Signed)
Please let patient know that I heard back from Dr. Tamala Julian who recommended MRI of back. I have placed order for this. Hopefully he can complete prior to followup with Dr. Tamala Julian.

## 2020-02-13 NOTE — Patient Instructions (Signed)
Get Tdap done at pharmacy

## 2020-02-14 ENCOUNTER — Telehealth: Payer: Self-pay | Admitting: Family Medicine

## 2020-02-14 ENCOUNTER — Telehealth: Payer: Self-pay | Admitting: *Deleted

## 2020-02-14 NOTE — Telephone Encounter (Signed)
-----   Message from Caren Macadam, MD sent at 02/13/2020  2:49 PM EDT -----   ----- Message ----- From: Lyndal Pulley, DO Sent: 02/13/2020   9:49 AM EDT To: Caren Macadam, MD  Thanks for heads up I would get MRI of the back  ----- Message ----- From: Caren Macadam, MD Sent: 02/13/2020   9:47 AM EDT To: Lyndal Pulley, DO  Patient here with me today and states no improvement in groin pain, lower back still bothering him a lot. He sees you in a couple of weeks, but is there another imaging study you would like for him to have prior to seeing you? He's frustrated with ongoing pain. Let me know how I can help.

## 2020-02-14 NOTE — Telephone Encounter (Signed)
I left a detailed message at the pts cell number with the information below. 

## 2020-02-14 NOTE — Telephone Encounter (Signed)
Pt is calling in stating that he would like clarification on the referral he wants to make sure that this is what Dr. Tamala Julian and Dr. Ethlyn Gallery wants he to do a MRI of his lumbar pt was thinking it was for his abdomen.  Pt would like to have a call back so that he can call [facility] back to schedule the appointment.

## 2020-02-15 NOTE — Telephone Encounter (Signed)
Yes lumbar is correct. I know he has pain that radiates around abdomen, but thought is that pain is coming from lumbar spine/nerve and MRI should show this. If he would like to wait to further discuss with Dr. Tamala Julian, that is fine, but I thought it would be nice to have ahead of visit. As he has experienced before, sometimes pain can be multifactorial. Starting at back is reasonable and would make most sense for pattern of pain he has currently.

## 2020-02-15 NOTE — Telephone Encounter (Signed)
Left a detailed message at the pts cell number with the information below. 

## 2020-02-19 ENCOUNTER — Ambulatory Visit
Admission: RE | Admit: 2020-02-19 | Discharge: 2020-02-19 | Disposition: A | Discharge status: Home / Self Care | Payer: Medicare Other | Source: Ambulatory Visit | Attending: Family Medicine | Admitting: Family Medicine

## 2020-02-19 DIAGNOSIS — M48061 Spinal stenosis, lumbar region without neurogenic claudication: Secondary | ICD-10-CM | POA: Diagnosis not present

## 2020-02-19 DIAGNOSIS — M48062 Spinal stenosis, lumbar region with neurogenic claudication: Secondary | ICD-10-CM

## 2020-02-24 NOTE — Progress Notes (Signed)
[facility] [address] [place] Phone: [phone] Subjective:   I Bryan [name] am serving as a Education administrator for Dr. Hulan Saas.  This visit occurred during the SARS-CoV-2 public health emergency.  Safety protocols were in place, including screening questions prior to the visit, additional usage of staff PPE, and extensive cleaning of exam room while observing appropriate contact time as indicated for disinfecting solutions.   I'm seeing this patient by the request  of:  Caren Macadam, MD  CC: Right hip and back pain follow-up  [id number]   12/26/2019 Continues to have groin pain.  Minimal improvement.  Patient is no longer having as much of a lumbar radiculopathy which is improvement.  Discussed medication management.  Started Singulair to see if this will be beneficial.  Discussed home exercises and icing regimen.  Patient has had elevated prostate antigen but is following up with urology.  If no improvement with use either increase allopurinol to 200 or 300 mg as well as possible bone scan to see if there is any loosening of the replacement.  Patient did have an MRI previously but secondary to the artifact of the replacements difficult to see.  Update 02/24/2020 Bryan [name] is a 74 y.o. male coming in with complaint of right groin pain. Patient states he is feeling better. States he hasn't gotten worse.  Patient states been able to swim fairly regularly and does state that that helps as well as placing golf most days of the week and enjoying it.  Patient states that back pain is always there but not enough to stop him from different things.  Intermittent radiation.      Past Medical History:  Diagnosis Date  . Anxiety   . Cancer ([place])    facial skin cancer  . Depression   . GERD (gastroesophageal reflux disease)    takes tums rarely  . Hematuria    1 time in july 2019  . Hypertension   . Osteoarthritis   . Spinal  stenosis    Past Surgical History:  Procedure Laterality Date  . ACHILLES TENDON SURGERY  02/2006  . ANTERIOR LAT LUMBAR FUSION N/A 11/11/2017   Procedure: LUMBAR TWO- LUMBAR THREE, LUMBAR THREE- LUMBAR FOUR, LUMBAR FOUR- LUMBAR FIVE ANTERIOLATERAL INTERBODY ARTHRODESIS;  Surgeon: Jovita Gamma, MD;  Location: [place];  Service: Neurosurgery;  Laterality: N/A;  . COLONOSCOPY  2003  . COLONOSCOPY    . CYSTOSCOPY N/A 05/21/2018   Procedure: CYSTOSCOPY FLEXIBLE;  Surgeon: Festus Aloe, MD;  Location[facility];  Service: Urology;  Laterality: N/A;  . KNEE SURGERY Left 1991 and 2001   X2 arthroscopy  . LUMBAR PERCUTANEOUS PEDICLE SCREW 3 LEVEL N/A 11/11/2017   Procedure: LUMBAR PERCUTANEOUS PEDICLE SCREW PLACEMENT LUMBAR TWO-THREE, LUMBER THREE-FOUR, LUMBAR FOUR-FIVE;  Surgeon: Jovita Gamma, MD;  Location: [place];  Service: Neurosurgery;  Laterality: N/A;  . SPINE SURGERY  12-08, 5-10  . TOTAL HIP ARTHROPLASTY     oct 2006,  . TOTAL HIP ARTHROPLASTY Right 02/09/2018   Procedure: RIGHT TOTAL HIP ARTHROPLASTY ANTERIOR APPROACH;  Surgeon: Paralee Cancel, MD;  Location: [facility];  Service: Orthopedics;  Laterality: Right;   Social History   Socioeconomic History  . Marital status: Married    Spouse name: Not on file  . Number of children: Not on file  . Years of education: Not on file  . Highest education level: Not on file  Occupational History  . Not on file  Tobacco Use  . Smoking status: Never Smoker  . Smokeless tobacco: Never Used  Vaping Use  . Vaping Use: Never used  Substance and Sexual Activity  . Alcohol use: No  . Drug use: No  . Sexual activity: Not on file  Other Topics Concern  . Not on file  Social History Narrative   Divorced, Exercises regularly   Social Determinants of Health   Financial Resource Strain: Low Risk   . Difficulty of Paying Living Expenses: Not hard at all  Food Insecurity: No Food Insecurity  . Worried About Sales executive in the Last Year: Never true  . Ran Out of Food in the Last Year: Never true  Transportation Needs: No Transportation Needs  . Lack of Transportation (Medical): No  . Lack of Transportation (Non-Medical): No  Physical Activity: Sufficiently Active  . Days of Exercise per Week: 5 days  . Minutes of Exercise per Session: 50 min  Stress: No Stress Concern Present  . Feeling of Stress : Not at all  Social Connections: Moderately Isolated  . Frequency of Communication with Friends and Family: More than three times a week  . Frequency of Social Gatherings with Friends and Family: Three times a week  . Attends Religious Services: Never  . Active Member of Clubs or Organizations: No  . Attends Archivist Meetings: Never  . Marital Status: Married   Allergies  Allergen Reactions  . Codeine Other (See Comments)    "made me sick"   Family History  Problem Relation Age of Onset  . Diabetes Mother   . Coronary artery disease Mother 3  . Hypertension Mother   . Other Father 11  . Colon polyps Neg Hx   . Esophageal cancer Neg Hx   . Rectal cancer Neg Hx   . Stomach cancer Neg Hx      Current Outpatient Medications (Cardiovascular):  .  lisinopril-hydrochlorothiazide (ZESTORETIC) 20-25 MG tablet, TAKE (1) TABLET DAILY.   Current Outpatient Medications (Analgesics):  .  acetaminophen (TYLENOL) 650 MG CR tablet, Take 650 mg by mouth every 8 (eight) hours as needed for pain. .  meloxicam (MOBIC) 15 MG tablet, TAKE 1 TABLET ONCE DAILY. .  traMADol (ULTRAM) 50 MG tablet, TAKE (1) TABLET AS NEEDED.   Current Outpatient Medications (Other):  .  b complex vitamins tablet, Take 1 tablet by mouth daily. .  Cholecalciferol (VITAMIN D3) 2000 units TABS, Take by mouth. .  gabapentin (NEURONTIN) 100 MG capsule, Take 2 capsules (200 mg total) by mouth at bedtime. .  Ginger, Zingiber officinalis, (GINGER PO), Take by mouth. .  Multiple Vitamin (MULTIVITAMIN) capsule, Take 1  capsule by mouth daily. .  Multiple Vitamins-Minerals (HAIR SKIN AND NAILS FORMULA PO), Take by mouth. Marland Kitchen  tiZANidine (ZANAFLEX) 4 MG tablet, TAKE 1 TABLET THREE TIMES DAILY AS NEEDED FOR MUSCLE SPASMS. Marland Kitchen  TURMERIC PO, Take by mouth. .  Vitamin D, Ergocalciferol, (DRISDOL) 1.25 MG (50000 UNIT) CAPS capsule, Take 1 capsule (50,000 Units total) by mouth every 7 (seven) days.   Reviewed prior external information including notes and imaging from  primary care provider As well as notes that were available from care everywhere and other healthcare systems.  Past medical history, social, surgical and family history all reviewed in electronic medical record.  No pertanent information unless stated regarding to the chief complaint.   Review of Systems:  No headache, visual changes, nausea, vomiting, diarrhea, constipation, dizziness, abdominal pain, skin rash, fevers, chills, night  sweats, weight loss, swollen lymph nodes, body aches, joint swelling, chest pain, shortness of breath, mood changes. POSITIVE muscle aches  Objective  Blood pressure 120/78, pulse 82, height 6\' 3"  (1.905 m), weight 212 lb (96.2 kg), SpO2 96 %.   General: No apparent distress alert and oriented x3 mood and affect normal, dressed appropriately.  HEENT: Pupils equal, extraocular movements intact  Respiratory: Patient's speak in full sentences and does not appear short of breath  Cardiovascular: No lower extremity edema, non tender, no erythema  Gait mild antalgic Low back exam does have significant loss of lordosis, does have decreased range of motion secondary to muscle tightness.  Patient's Right hip nontender to palpation.  Neurovascularly intact distally.  Tightness noted.   Impression and Recommendations:     The above documentation has been reviewed and is accurate and complete Lyndal Pulley, DO       Note: This dictation was prepared with Dragon dictation along with smaller phrase technology. Any  transcriptional errors that result from this process are unintentional.

## 2020-02-28 ENCOUNTER — Encounter: Payer: Self-pay | Admitting: Family Medicine

## 2020-02-28 ENCOUNTER — Ambulatory Visit: Payer: Medicare Other | Admitting: Family Medicine

## 2020-02-28 ENCOUNTER — Other Ambulatory Visit: Payer: Self-pay

## 2020-02-28 DIAGNOSIS — M48062 Spinal stenosis, lumbar region with neurogenic claudication: Secondary | ICD-10-CM

## 2020-02-28 NOTE — Assessment & Plan Note (Signed)
Patient does have more of a chronic problem but seems to be stable.  I discussed the gabapentin at this time.  Patient does have some chronic irritation that occurs and does have findings that are suggestive with the degenerative disc in the adjacent segment disease.  We discussed the next step would also be a bone scan of the hip and likely back to further evaluate for any loosening of any of the hardware which I think is unlikely at the moment.  Can repeat epidurals in the back as well that I think could be beneficial for diagnostic and therapeutic purposes, no change in medications, follow-up again 2 to 3 months.  Total time with patient today as well as on the date of service reviewing patient's imaging in the chart 36 minutes

## 2020-02-28 NOTE — Patient Instructions (Addendum)
Good to see you Write [place] when you are ready for PT and dry needling  Keep doing everything else No change in meds See me again in 3 months

## 2020-03-05 ENCOUNTER — Other Ambulatory Visit: Payer: Self-pay

## 2020-03-05 ENCOUNTER — Encounter: Payer: Self-pay | Admitting: Family Medicine

## 2020-03-05 ENCOUNTER — Ambulatory Visit (INDEPENDENT_AMBULATORY_CARE_PROVIDER_SITE_OTHER): Payer: Medicare Other | Admitting: Family Medicine

## 2020-03-05 VITALS — BP 118/80 | HR 68 | Temp 98.8°F | Ht 75.0 in | Wt 212.1 lb

## 2020-03-05 DIAGNOSIS — R972 Elevated prostate specific antigen [PSA]: Secondary | ICD-10-CM

## 2020-03-05 DIAGNOSIS — E785 Hyperlipidemia, unspecified: Secondary | ICD-10-CM

## 2020-03-05 DIAGNOSIS — Z0000 Encounter for general adult medical examination without abnormal findings: Secondary | ICD-10-CM | POA: Diagnosis not present

## 2020-03-05 DIAGNOSIS — Z23 Encounter for immunization: Secondary | ICD-10-CM

## 2020-03-05 DIAGNOSIS — M48062 Spinal stenosis, lumbar region with neurogenic claudication: Secondary | ICD-10-CM | POA: Diagnosis not present

## 2020-03-05 DIAGNOSIS — I10 Essential (primary) hypertension: Secondary | ICD-10-CM | POA: Diagnosis not present

## 2020-03-05 DIAGNOSIS — R739 Hyperglycemia, unspecified: Secondary | ICD-10-CM | POA: Diagnosis not present

## 2020-03-05 DIAGNOSIS — M1990 Unspecified osteoarthritis, unspecified site: Secondary | ICD-10-CM

## 2020-03-05 MED ORDER — LISINOPRIL-HYDROCHLOROTHIAZIDE 20-25 MG PO TABS: 1.0000 | ORAL_TABLET | Freq: Every day | ORAL | 1 refills | Status: DC

## 2020-03-05 NOTE — Addendum Note (Signed)
Addended by: Agnes Lawrence on: 03/05/2020 09:03 AM   Modules accepted: Orders

## 2020-03-05 NOTE — Progress Notes (Signed)
Bryan [name] DOB: [birth year] Encounter date: 03/05/2020  This is a 74 y.o. male who presents for complete physical   History of present illness/Additional concerns: Has some relief that there wasn't something physically wrong with him and just back issues.   Wants flu shot today. Will get Tdap at pharmacy eventually.   Following urology for PSA - seeing them in 1 month. Numbers have been stable. Has had evaluation including biopsies and MRI and can't find anything except for PSA elevation.   Exercising every day. Pool, golf. Back hurts more in the morning, but as he gets moving/takes medication he does ok. Tramadol helps him with pain, esp after activity. Still taking meloxicam. Gets relief from ES tylenol as well.   Past Medical History:  Diagnosis Date  . Anxiety   . Cancer ([place])    facial skin cancer  . Depression   . GERD (gastroesophageal reflux disease)    takes tums rarely  . Hematuria    1 time in july 2019  . Hypertension   . Osteoarthritis   . Spinal stenosis    Past Surgical History:  Procedure Laterality Date  . ACHILLES TENDON SURGERY  02/2006  . ANTERIOR LAT LUMBAR FUSION N/A 11/11/2017   Procedure: LUMBAR TWO- LUMBAR THREE, LUMBAR THREE- LUMBAR FOUR, LUMBAR FOUR- LUMBAR FIVE ANTERIOLATERAL INTERBODY ARTHRODESIS;  Surgeon: Jovita Gamma, MD;  Location: [place];  Service: Neurosurgery;  Laterality: N/A;  . COLONOSCOPY  2003  . COLONOSCOPY    . CYSTOSCOPY N/A 05/21/2018   Procedure: CYSTOSCOPY FLEXIBLE;  Surgeon: Festus Aloe, MD;  Location: [facility];  Service: Urology;  Laterality: N/A;  . KNEE SURGERY Left 1991 and 2001   X2 arthroscopy  . LUMBAR PERCUTANEOUS PEDICLE SCREW 3 LEVEL N/A 11/11/2017   Procedure: LUMBAR PERCUTANEOUS PEDICLE SCREW PLACEMENT LUMBAR TWO-THREE, LUMBER THREE-FOUR, LUMBAR FOUR-FIVE;  Surgeon: Jovita Gamma, MD;  Location: [place];  Service: Neurosurgery;  Laterality: N/A;  . SPINE SURGERY  12-08, 5-10  . TOTAL HIP  ARTHROPLASTY     oct 2006,  . TOTAL HIP ARTHROPLASTY Right 02/09/2018   Procedure: RIGHT TOTAL HIP ARTHROPLASTY ANTERIOR APPROACH;  Surgeon: Paralee Cancel, MD;  Location: [facility];  Service: Orthopedics;  Laterality: Right;   Allergies  Allergen Reactions  . Codeine Other (See Comments)    "made me sick"   Current Meds  Medication Sig  . acetaminophen (TYLENOL) 650 MG CR tablet Take 650 mg by mouth every 8 (eight) hours as needed for pain.  Marland Kitchen ALFALFA PO Take 1,200 mg by mouth daily.  Marland Kitchen b complex vitamins tablet Take 1 tablet by mouth daily.  . Cholecalciferol (VITAMIN D3) 2000 units TABS Take by mouth.  . gabapentin (NEURONTIN) 100 MG capsule Take 2 capsules (200 mg total) by mouth at bedtime.  . Ginger, Zingiber officinalis, (GINGER PO) Take by mouth.  Marland Kitchen lisinopril-hydrochlorothiazide (ZESTORETIC) 20-25 MG tablet Take 1 tablet by mouth daily.  . meloxicam (MOBIC) 15 MG tablet TAKE 1 TABLET ONCE DAILY.  . Multiple Vitamin (MULTIVITAMIN) capsule Take 1 capsule by mouth daily.  . Multiple Vitamins-Minerals (HAIR SKIN AND NAILS FORMULA PO) Take by mouth.  Marland Kitchen tiZANidine (ZANAFLEX) 4 MG tablet TAKE 1 TABLET THREE TIMES DAILY AS NEEDED FOR MUSCLE SPASMS.  Marland Kitchen traMADol (ULTRAM) 50 MG tablet TAKE (1) TABLET AS NEEDED.  . [DISCONTINUED] lisinopril-hydrochlorothiazide (ZESTORETIC) 20-25 MG tablet TAKE (1) TABLET DAILY.   Social History   Tobacco Use  . Smoking status: Never Smoker  . Smokeless tobacco: Never Used  Substance Use Topics  . Alcohol use: No   Family History  Problem Relation Age of Onset  . Diabetes Mother   . Coronary artery disease Mother 47  . Hypertension Mother   . Other Father 74  . Colon polyps Neg Hx   . Esophageal cancer Neg Hx   . Rectal cancer Neg Hx   . Stomach cancer Neg Hx      Review of Systems  Constitutional: Negative for activity change, appetite change, chills, fatigue, fever and unexpected weight change.  HENT: Negative for congestion, ear pain,  hearing loss, sinus pressure, sinus pain, sore throat and trouble swallowing.   Eyes: Negative for pain and visual disturbance.  Respiratory: Negative for cough, chest tightness, shortness of breath and wheezing.   Cardiovascular: Negative for chest pain, palpitations and leg swelling.  Gastrointestinal: Negative for abdominal distention, abdominal pain, blood in stool, constipation, diarrhea, nausea and vomiting.  Genitourinary: Negative for decreased urine volume, difficulty urinating, dysuria, penile pain and testicular pain.  Musculoskeletal: Negative for arthralgias, back pain and joint swelling.  Skin: Negative for rash.  Neurological: Negative for dizziness, weakness, numbness and headaches.  Hematological: Negative for adenopathy. Does not bruise/bleed easily.  Psychiatric/Behavioral: Negative for agitation, sleep disturbance and suicidal ideas. The patient is not nervous/anxious.     CBC:  Lab Results  Component Value Date   WBC 4.9 08/31/2019   HGB 17.8 (H) 08/31/2019   HCT 52.7 (H) 08/31/2019   MCH 29.9 02/10/2018   MCHC 33.8 08/31/2019   RDW 13.1 08/31/2019   PLT 223.0 08/31/2019   MPV 7.2 07/29/2015   CMP: Lab Results  Component Value Date   NA 137 08/31/2019   K 4.0 08/31/2019   CL 99 08/31/2019   CO2 32 08/31/2019   ANIONGAP 7 02/10/2018   GLUCOSE 102 (H) 08/31/2019   GLUCOSE 98 05/26/2006   BUN 26 (H) 08/31/2019   CREATININE 1.18 08/31/2019   GFRAA >60 02/10/2018   CALCIUM 10.4 08/31/2019   PROT 6.7 08/31/2019   BILITOT 1.3 (H) 08/31/2019   ALKPHOS 54 08/31/2019   ALT 19 08/31/2019   AST 23 08/31/2019   LIPID: Lab Results  Component Value Date   CHOL 205 (H) 03/02/2019   TRIG 237.0 (H) 03/02/2019   TRIG 95 05/26/2006   HDL 39.70 03/02/2019   LDLCALC 110 (H) 01/14/2017    Objective:  BP 118/80 (BP Location: Left Arm, Patient Position: Sitting, Cuff Size: Large)   Pulse 68   Temp 98.8 F (37.1 C) (Oral)   Ht 6\' 3"  (1.905 m)   Wt 212 lb 1.6  oz (96.2 kg)   BMI 26.51 kg/m   Weight: 212 lb 1.6 oz (96.2 kg)   BP Readings from Last 3 Encounters:  03/05/20 118/80  02/28/20 120/78  02/13/20 112/64   Wt Readings from Last 3 Encounters:  03/05/20 212 lb 1.6 oz (96.2 kg)  02/28/20 212 lb (96.2 kg)  02/13/20 213 lb (96.6 kg)    Physical Exam Constitutional:      General: He is not in acute distress.    Appearance: He is well-developed.  HENT:     Head: Normocephalic and atraumatic.     Right Ear: External ear normal.     Left Ear: External ear normal.     Nose: Nose normal.     Mouth/Throat:     Pharynx: No oropharyngeal exudate.  Eyes:     Conjunctiva/sclera: Conjunctivae normal.     Pupils: Pupils are equal, round, and  reactive to light.  Neck:     Thyroid: No thyromegaly.  Cardiovascular:     Rate and Rhythm: Normal rate and regular rhythm.     Heart sounds: Normal heart sounds. No murmur heard.  No friction rub. No gallop.   Pulmonary:     Effort: Pulmonary effort is normal. No respiratory distress.     Breath sounds: Normal breath sounds. No stridor. No wheezing or rales.  Abdominal:     General: Bowel sounds are normal.     Palpations: Abdomen is soft.  Musculoskeletal:        General: Normal range of motion.     Cervical back: Neck supple.  Skin:    General: Skin is warm and dry.  Neurological:     Mental Status: He is alert and oriented to person, place, and time.  Psychiatric:        Behavior: Behavior normal.        Thought Content: Thought content normal.        Judgment: Judgment normal.     Assessment/Plan: Health Maintenance Due  Topic Date Due  . INFLUENZA VACCINE  01/15/2020   Health Maintenance reviewed: will get Tdap.  1. Preventative health care Keep up with healthy lifestyle.  2. Essential hypertension Well controlled. Continue current medication.   3. Osteoarthritis, unspecified osteoarthritis type, unspecified site Does well with meloxicam, ultram for severe days.  4.  Lumbar stenosis with neurogenic claudication Following with Dr. Tamala Julian.   5. PROSTATE SPECIFIC ANTIGEN, ELEVATED Following with urology. PSA has been stable.    Return in about 3 months (around 06/04/2020) for Chronic condition visit.  Micheline Rough, MD

## 2020-03-07 LAB — CBC WITH DIFFERENTIAL/PLATELET
Absolute Monocytes: 463 cells/uL (ref 200–950)
Basophils Absolute: 42 cells/uL (ref 0–200)
Basophils Relative: 0.8 %
Eosinophils Absolute: 234 cells/uL (ref 15–500)
Eosinophils Relative: 4.5 %
HCT: 52 % — ABNORMAL HIGH (ref 38.5–50.0)
Hemoglobin: 17.7 g/dL — ABNORMAL HIGH (ref 13.2–17.1)
Lymphs Abs: 1113 cells/uL (ref 850–3900)
MCH: 31.6 pg (ref 27.0–33.0)
MCHC: 34 g/dL (ref 32.0–36.0)
MCV: 92.9 fL (ref 80.0–100.0)
MPV: 9.4 fL (ref 7.5–12.5)
Monocytes Relative: 8.9 %
Neutro Abs: 3349 cells/uL (ref 1500–7800)
Neutrophils Relative %: 64.4 %
Platelets: 237 10*3/uL (ref 140–400)
RBC: 5.6 10*6/uL (ref 4.20–5.80)
RDW: 12.6 % (ref 11.0–15.0)
Total Lymphocyte: 21.4 %
WBC: 5.2 10*3/uL (ref 3.8–10.8)

## 2020-03-07 LAB — COMPREHENSIVE METABOLIC PANEL
AG Ratio: 1.9 (calc) (ref 1.0–2.5)
ALT: 20 U/L (ref 9–46)
AST: 24 U/L (ref 10–35)
Albumin: 4.4 g/dL (ref 3.6–5.1)
Alkaline phosphatase (APISO): 50 U/L (ref 35–144)
BUN: 24 mg/dL (ref 7–25)
CO2: 31 mmol/L (ref 20–32)
Calcium: 10.2 mg/dL (ref 8.6–10.3)
Chloride: 101 mmol/L (ref 98–110)
Creat: 1.13 mg/dL (ref 0.70–1.18)
Globulin: 2.3 g/dL (calc) (ref 1.9–3.7)
Glucose, Bld: 104 mg/dL — ABNORMAL HIGH (ref 65–99)
Potassium: 4.4 mmol/L (ref 3.5–5.3)
Sodium: 138 mmol/L (ref 135–146)
Total Bilirubin: 1.2 mg/dL (ref 0.2–1.2)
Total Protein: 6.7 g/dL (ref 6.1–8.1)

## 2020-03-07 LAB — LIPID PANEL
Cholesterol: 182 mg/dL (ref <200)
HDL: 43 mg/dL (ref >=40)
LDL Cholesterol (Calc): 107 mg/dL (calc) — ABNORMAL HIGH
Non-HDL Cholesterol (Calc): 139 mg/dL (calc) — ABNORMAL HIGH (ref <130)
Total CHOL/HDL Ratio: 4.2 (calc) (ref <5.0)
Triglycerides: 203 mg/dL — ABNORMAL HIGH (ref <150)

## 2020-03-07 LAB — TEST AUTHORIZATION

## 2020-03-07 LAB — HEMOGLOBIN A1C W/OUT EAG: Hgb A1c MFr Bld: 5.4 % of total Hgb (ref <5.7)

## 2020-03-19 ENCOUNTER — Other Ambulatory Visit: Payer: Self-pay | Admitting: Family Medicine

## 2020-03-31 ENCOUNTER — Ambulatory Visit: Payer: Medicare Other | Attending: Internal Medicine

## 2020-03-31 DIAGNOSIS — Z23 Encounter for immunization: Secondary | ICD-10-CM

## 2020-03-31 NOTE — Progress Notes (Signed)
   Covid-19 Vaccination [facility]  Name:  Bryan [name]    MRN: [id number] DOB: [birth year]  03/31/2020  Mr. Bryan [name] was observed post Covid-19 immunization for 15 minutes without incident. He was provided with Vaccine Information Sheet and instruction to access the V-Safe system.   Mr. Bryan [name] was instructed to call 911 with any severe reactions post vaccine: Marland Kitchen Difficulty breathing  . Swelling of face and throat  . A fast heartbeat  . A bad rash all over body  . Dizziness and weakness

## 2020-04-11 ENCOUNTER — Other Ambulatory Visit: Payer: Self-pay | Admitting: Family Medicine

## 2020-04-11 DIAGNOSIS — R350 Frequency of micturition: Secondary | ICD-10-CM | POA: Diagnosis not present

## 2020-05-29 ENCOUNTER — Other Ambulatory Visit: Payer: Self-pay

## 2020-05-29 ENCOUNTER — Ambulatory Visit: Payer: Medicare Other | Admitting: Family Medicine

## 2020-05-29 ENCOUNTER — Encounter: Payer: Self-pay | Admitting: Family Medicine

## 2020-05-29 DIAGNOSIS — M48062 Spinal stenosis, lumbar region with neurogenic claudication: Secondary | ICD-10-CM

## 2020-05-29 NOTE — Progress Notes (Signed)
[place] Phone: [phone] Subjective:   Bryan [name], am serving as a scribe for Dr. Hulan Saas. This visit occurred during the SARS-CoV-2 public health emergency.  Safety protocols were in place, including screening questions prior to the visit, additional usage of staff PPE, and extensive cleaning of exam room while observing appropriate contact time as indicated for disinfecting solutions.   I'm seeing this patient by the request  of:  Koberlein, Steele Berg, MD  CC: low back pain   WCB:[id number]   02/28/2020 Patient does have more of a chronic problem but seems to be stable.  I discussed the gabapentin at this time.  Patient does have some chronic irritation that occurs and does have findings that are suggestive with the degenerative disc in the adjacent segment disease.  We discussed the next step would also be a bone scan of the hip and likely back to further evaluate for any loosening of any of the hardware which I think is unlikely at the moment.  Can repeat epidurals in the back as well that I think could be beneficial for diagnostic and therapeutic purposes, [name] change in medications, follow-up again 2 to 3 months.  Total time with patient today as well as on the date of service reviewing patient's imaging in the chart 36 minutes   Update 05/29/2020 Bryan [name] is a 74 y.o. male coming in with complaint of lumbar spinal stenosis. States that his pain is not better or worse since he started coming to see [place]. Uses Tramadol and gabapentin daily. Feels that he is at a point where he does not need to come in to see [place] any longer. Is managing his pain with medication.     MRI lumbar 02/2020 IMPRESSION: 1. Interval L2-L5 fusion without residual compressive stenosis. 2. Progressive adjacent segment disease at L1-2 with mild spinal stenosis and moderate lateral recess stenosis. 3. Progressive, moderate  bilateral neural foraminal stenosis at L5-S1.  Past Medical History:  Diagnosis Date  . Anxiety   . Cancer ([place])    facial skin cancer  . Depression   . GERD (gastroesophageal reflux disease)    takes tums rarely  . Hematuria    1 time in july 2019  . Hypertension   . Osteoarthritis   . Spinal stenosis    Past Surgical History:  Procedure Laterality Date  . ACHILLES TENDON SURGERY  02/2006  . ANTERIOR LAT LUMBAR FUSION N/A 11/11/2017   Procedure: LUMBAR TWO- LUMBAR THREE, LUMBAR THREE- LUMBAR FOUR, LUMBAR FOUR- LUMBAR FIVE ANTERIOLATERAL INTERBODY ARTHRODESIS;  Surgeon: Jovita Gamma, MD;  Location: [facility];  Service: Neurosurgery;  Laterality: N/A;  . COLONOSCOPY  2003  . COLONOSCOPY    . CYSTOSCOPY N/A 05/21/2018   Procedure: CYSTOSCOPY FLEXIBLE;  Surgeon: Festus Aloe, MD;  Location: [facility];  Service: Urology;  Laterality: N/A;  . KNEE SURGERY Left 1991 and 2001   X2 arthroscopy  . LUMBAR PERCUTANEOUS PEDICLE SCREW 3 LEVEL N/A 11/11/2017   Procedure: LUMBAR PERCUTANEOUS PEDICLE SCREW PLACEMENT LUMBAR TWO-THREE, LUMBER THREE-FOUR, LUMBAR FOUR-FIVE;  Surgeon: Jovita Gamma, MD;  Location: [facility];  Service: Neurosurgery;  Laterality: N/A;  . SPINE SURGERY  12-08, 5-10  . TOTAL HIP ARTHROPLASTY     oct 2006,  . TOTAL HIP ARTHROPLASTY Right 02/09/2018   Procedure: RIGHT TOTAL HIP ARTHROPLASTY ANTERIOR APPROACH;  Surgeon: Paralee Cancel, MD;  Location: [facility];  Service: Orthopedics;  Laterality: Right;  Social History   Socioeconomic History  . Marital status: Married    Spouse name: Not on file  . Number of children: Not on file  . Years of education: Not on file  . Highest education level: Not on file  Occupational History  . Not on file  Tobacco Use  . Smoking status: Never Smoker  . Smokeless tobacco: Never Used  Vaping Use  . Vaping Use: Never used  Substance and Sexual Activity  . Alcohol use: [name]  . Drug use: [name]  . Sexual activity: Not  on file  Other Topics Concern  . Not on file  Social History Narrative   Divorced, Exercises regularly   Social Determinants of Health   Financial Resource Strain: Low Risk   . Difficulty of Paying Living Expenses: Not hard at all  Food Insecurity: [name] Food Insecurity  . Worried About Charity fundraiser in the Last Year: Never true  . Ran Out of Food in the Last Year: Never true  Transportation Needs: [name] Transportation Needs  . Lack of Transportation (Medical): [name]  . Lack of Transportation (Non-Medical): [name]  Physical Activity: Sufficiently Active  . Days of Exercise per Week: 5 days  . Minutes of Exercise per Session: 50 min  Stress: [name] Stress Concern Present  . Feeling of Stress : Not at all  Social Connections: Moderately Isolated  . Frequency of Communication with Friends and Family: More than three times a week  . Frequency of Social Gatherings with Friends and Family: Three times a week  . Attends Religious Services: Never  . Active Member of Clubs or Organizations: [name]  . Attends Archivist Meetings: Never  . Marital Status: Married   Allergies  Allergen Reactions  . Codeine Other (See Comments)    "made me sick"   Family History  Problem Relation Age of Onset  . Diabetes Mother   . Coronary artery disease Mother 78  . Hypertension Mother   . Other Father 31  . Colon polyps Neg Hx   . Esophageal cancer Neg Hx   . Rectal cancer Neg Hx   . Stomach cancer Neg Hx      Current Outpatient Medications (Cardiovascular):  .  lisinopril-hydrochlorothiazide (ZESTORETIC) 20-25 MG tablet, Take 1 tablet by mouth daily.   Current Outpatient Medications (Analgesics):  .  acetaminophen (TYLENOL) 650 MG CR tablet, Take 650 mg by mouth every 8 (eight) hours as needed for pain. .  meloxicam (MOBIC) 15 MG tablet, TAKE 1 TABLET ONCE DAILY. .  traMADol (ULTRAM) 50 MG tablet, Take 1 tablet (50 mg total) by mouth 2 (two) times daily as needed for moderate  pain.   Current Outpatient Medications (Other):  Marland Kitchen  ALFALFA PO, Take 1,200 mg by mouth daily. Marland Kitchen  b complex vitamins tablet, Take 1 tablet by mouth daily. .  Cholecalciferol (VITAMIN D3) 2000 units TABS, Take by mouth. .  gabapentin (NEURONTIN) 100 MG capsule, Take 2 capsules (200 mg total) by mouth at bedtime. .  Ginger, Zingiber officinalis, (GINGER PO), Take by mouth. .  Multiple Vitamin (MULTIVITAMIN) capsule, Take 1 capsule by mouth daily. .  Multiple Vitamins-Minerals (HAIR SKIN AND NAILS FORMULA PO), Take by mouth. Marland Kitchen  tiZANidine (ZANAFLEX) 4 MG tablet, TAKE 1 TABLET THREE TIMES DAILY AS NEEDED FOR MUSCLE SPASMS. .  vitamin C (ASCORBIC ACID) 250 MG tablet, Take 250 mg by mouth daily.   Reviewed prior external information including notes and imaging from  primary care provider As well  as notes that were available from care everywhere and other healthcare systems.  Past medical history, social, surgical and family history all reviewed in electronic medical record.  [name] pertanent information unless stated regarding to the chief complaint.   Review of Systems:  [name] headache, visual changes, nausea, vomiting, diarrhea, constipation, dizziness, abdominal pain, skin rash, fevers, chills, night sweats, weight loss, swollen lymph nodes, body aches, joint swelling, chest pain, shortness of breath, mood changes. POSITIVE muscle aches  Objective  Blood pressure 116/70, height 6\' 3"  (1.905 m), weight 213 lb (96.6 kg).   General: [name] apparent distress alert and oriented x3 mood and affect normal, dressed appropriately.  HEENT: Pupils equal, extraocular movements intact  Respiratory: Patient's speak in full sentences and does not appear short of breath  Cardiovascular: [name] lower extremity edema, non tender, [name] erythema  Gait normal with good balance and coordination.  MSK: Patient overall does have some loss of lordosis of the back.  Mild tightness with straight leg test.  [name] neurovascular intact  distally.  5-5 strength of the lower extremities    Impression and Recommendations:     The above documentation has been reviewed and is accurate and complete Lyndal Pulley, DO

## 2020-05-29 NOTE — Patient Instructions (Addendum)
If pain stops you give [place] a call See [place] when you need [place]  Continue everything else

## 2020-05-29 NOTE — Assessment & Plan Note (Signed)
Patient does have the lumbar spinal stenosis.  Patient does have the hip replacement.  The seem to be giving her more the discomfort.  We discussed with patient in great length.  Patient states that the pain is controlled with the tramadol as well as with the Tylenol.  Not taking other medicines regularly.  Patient is doing well with this and will continue at this time to continue the management.  Did not want to try anything more aggressive.  MRIs have ruled out anything else and we did discuss the epidurals in the back otherwise.  Patient can follow-up with me as needed

## 2020-06-06 ENCOUNTER — Other Ambulatory Visit: Payer: Self-pay

## 2020-06-06 ENCOUNTER — Encounter: Payer: Self-pay | Admitting: Family Medicine

## 2020-06-06 ENCOUNTER — Ambulatory Visit (INDEPENDENT_AMBULATORY_CARE_PROVIDER_SITE_OTHER): Payer: Medicare Other | Admitting: Family Medicine

## 2020-06-06 VITALS — BP 118/68 | HR 66 | Temp 97.7°F | Ht 75.0 in | Wt 216.1 lb

## 2020-06-06 DIAGNOSIS — R1031 Right lower quadrant pain: Secondary | ICD-10-CM | POA: Diagnosis not present

## 2020-06-06 DIAGNOSIS — M48062 Spinal stenosis, lumbar region with neurogenic claudication: Secondary | ICD-10-CM | POA: Diagnosis not present

## 2020-06-06 MED ORDER — TRAMADOL HCL 50 MG PO TABS: 50.0000 mg | ORAL_TABLET | Freq: Four times a day (QID) | ORAL | 2 refills | Status: DC | PRN

## 2020-06-06 NOTE — Progress Notes (Signed)
[name] DOB: January 23, 1946 Encounter date: 06/06/2020  This is a 74 y.o. male who presents with Chief Complaint  Patient presents with  . Follow-up    History of present illness: Last visit with Dr. Tamala Julian is 05/29/2020: Suggested since pain was controlled with tramadol and was able to continue with desired activities with the aid of this medication, and patient's desire not to have further intervention for the back, he left follow-up on as needed basis.  He has some interest in dry needling. He will seek further intervention if worsening. He isn't really better or worse with regards to groin pain.   Takes the tramadol BID. Takes the meloxicam, tylenol as well. Usually in the evening takes another ES tylenol along with tramadol. Can definitely tell if he doesn't take medication; really becomes more limited with pain. Still exercising regularly.   Allergies  Allergen Reactions  . Codeine Other (See Comments)    "made me sick"   Current Meds  Medication Sig  . acetaminophen (TYLENOL) 650 MG CR tablet Take 650 mg by mouth every 8 (eight) hours as needed for pain.  Marland Kitchen ALFALFA PO Take 1,200 mg by mouth daily.  Marland Kitchen b complex vitamins tablet Take 1 tablet by mouth daily.  . Cholecalciferol (VITAMIN D3) 2000 units TABS Take by mouth.  . gabapentin (NEURONTIN) 100 MG capsule Take 2 capsules (200 mg total) by mouth at bedtime.  . Ginger, Zingiber officinalis, (GINGER PO) Take by mouth.  Marland Kitchen lisinopril-hydrochlorothiazide (ZESTORETIC) 20-25 MG tablet Take 1 tablet by mouth daily.  . meloxicam (MOBIC) 15 MG tablet TAKE 1 TABLET ONCE DAILY.  . Multiple Vitamin (MULTIVITAMIN) capsule Take 1 capsule by mouth daily.  . Multiple Vitamins-Minerals (HAIR SKIN AND NAILS FORMULA PO) Take by mouth.  Marland Kitchen OVER THE COUNTER MEDICATION Magnesium 400mg  daily  . OVER THE COUNTER MEDICATION Black elderberry daily  . tiZANidine (ZANAFLEX) 4 MG tablet TAKE 1 TABLET THREE TIMES DAILY AS NEEDED FOR MUSCLE SPASMS.   . vitamin C (ASCORBIC ACID) 250 MG tablet Take 250 mg by mouth daily.  . [DISCONTINUED] traMADol (ULTRAM) 50 MG tablet Take 1 tablet (50 mg total) by mouth 2 (two) times daily as needed for moderate pain.    Review of Systems  Constitutional: Negative for chills, fatigue and fever.  Respiratory: Negative for cough, chest tightness, shortness of breath and wheezing.   Cardiovascular: Negative for chest pain, palpitations and leg swelling.  Musculoskeletal: Positive for back pain (never gone; always with groin pain).    Objective:  BP 100/62 (BP Location: Left Arm, Patient Position: Sitting, Cuff Size: Large)   Pulse 66   Temp 97.7 F (36.5 C) (Oral)   Ht 6\' 3"  (1.905 m)   Wt 216 lb 1.6 oz (98 kg)   BMI 27.01 kg/m   Weight: 216 lb 1.6 oz (98 kg)   BP Readings from Last 3 Encounters:  06/06/20 100/62  05/29/20 116/70  03/05/20 118/80   Wt Readings from Last 3 Encounters:  06/06/20 216 lb 1.6 oz (98 kg)  05/29/20 213 lb (96.6 kg)  03/05/20 212 lb 1.6 oz (96.2 kg)    Physical Exam Constitutional:      General: He is not in acute distress.    Appearance: He is well-developed.  Cardiovascular:     Rate and Rhythm: Normal rate and regular rhythm.     Heart sounds: Normal heart sounds. No murmur heard. No friction rub.  Pulmonary:     Effort: Pulmonary effort is normal. No  respiratory distress.     Breath sounds: Normal breath sounds. No wheezing or rales.  Musculoskeletal:     Right lower leg: No edema.     Left lower leg: No edema.     Comments: Able to stand up, bend over without difficulty today but he is medicated.   Neurological:     Mental Status: He is alert and oriented to person, place, and time.  Psychiatric:        Behavior: Behavior normal.     Assessment/Plan  1. Lumbar stenosis with neurogenic claudication Does well with tramadol. This is helping to control his pain.   2. Groin pain, right Stable; coming from low back/lumbar stenosis.     Return  in about 6 months (around 12/05/2020) for physical exam.     Theodis Shove, MD

## 2020-06-06 NOTE — Patient Instructions (Signed)
Check B 12 vitamin amount in the b complex/MVI/hair skin and nails. You may not need all the B12 you are getting in those three. If you change to a MVI adult - you can probably get enough vitamin C; maybe D (you'll have to search for this as it is more common in womens than mens), and get enough B12.

## 2020-07-11 ENCOUNTER — Other Ambulatory Visit: Payer: Self-pay | Admitting: Family Medicine

## 2020-07-20 ENCOUNTER — Telehealth: Payer: Self-pay | Admitting: Family Medicine

## 2020-07-20 NOTE — Progress Notes (Signed)
  Chronic Care Management   Outreach Note  07/20/2020 Name: LABARON DIGIROLAMO MRN: [id number] DOB: 11-08-1945  Referred by: Caren Macadam, MD Reason for referral : No chief complaint on file.   An unsuccessful telephone outreach was attempted today. The patient was referred to the pharmacist for assistance with care management and care coordination.   Follow Up Plan:   Carley Perdue [employer] Scheduler

## 2020-07-24 ENCOUNTER — Telehealth: Payer: Self-pay | Admitting: Family Medicine

## 2020-07-24 DIAGNOSIS — L820 Inflamed seborrheic keratosis: Secondary | ICD-10-CM | POA: Diagnosis not present

## 2020-07-24 DIAGNOSIS — Z85828 Personal history of other malignant neoplasm of skin: Secondary | ICD-10-CM | POA: Diagnosis not present

## 2020-07-24 DIAGNOSIS — L814 Other melanin hyperpigmentation: Secondary | ICD-10-CM | POA: Diagnosis not present

## 2020-07-24 DIAGNOSIS — L570 Actinic keratosis: Secondary | ICD-10-CM | POA: Diagnosis not present

## 2020-07-24 DIAGNOSIS — D1801 Hemangioma of skin and subcutaneous tissue: Secondary | ICD-10-CM | POA: Diagnosis not present

## 2020-07-24 DIAGNOSIS — L821 Other seborrheic keratosis: Secondary | ICD-10-CM | POA: Diagnosis not present

## 2020-07-24 DIAGNOSIS — D225 Melanocytic nevi of trunk: Secondary | ICD-10-CM | POA: Diagnosis not present

## 2020-07-24 NOTE — Progress Notes (Signed)
  Chronic Care Management   Outreach Note  07/24/2020 Name: Bryan [name] MRN: [id number] DOB: 12-21-1945  Referred by: Caren Macadam, MD Reason for referral : No chief complaint on file.   A second unsuccessful telephone outreach was attempted today. The patient was referred to pharmacist for assistance with care management and care coordination.  Follow Up Plan:   Carley Perdue [employer] Scheduler

## 2020-07-26 ENCOUNTER — Telehealth: Payer: Self-pay | Admitting: Family Medicine

## 2020-07-26 NOTE — Progress Notes (Signed)
  Chronic Care Management   Note  07/26/2020 Name: Bryan [name] MRN: [id number] DOB: August 11, 1945  Bryan [name] is a 75 y.o. year old male who is a primary care patient of Koberlein, Steele Berg, MD. I reached out to Bryan [name] by phone today in response to a referral sent by [name]'s PCP, Caren Macadam, MD.   [name] was given information about Chronic Care Management services today including:  1. CCM service includes personalized support from designated clinical staff supervised by his physician, including individualized plan of care and coordination with other care providers 2. 24/7 contact phone numbers for assistance for urgent and routine care needs. 3. Service will only be billed when office clinical staff spend 20 minutes or more in a month to coordinate care. 4. Only one practitioner may furnish and bill the service in a calendar month. 5. The patient may stop CCM services at any time (effective at the end of the month) by phone call to the office staff.   Patient agreed to services and verbal consent obtained.   Follow up plan:   Carley Perdue [employer] Scheduler

## 2020-07-26 NOTE — Progress Notes (Signed)
  Chronic Care Management   Outreach Note  07/26/2020 Name: Bryan [name] MRN: [id number] DOB: 1945-08-14  Referred by: Caren Macadam, MD Reason for referral : No chief complaint on file.   Third unsuccessful telephone outreach was attempted today. The patient was referred to the pharmacist for assistance with care management and care coordination.   Follow Up Plan:   Carley Perdue [employer] Scheduler

## 2020-07-31 DIAGNOSIS — H02831 Dermatochalasis of right upper eyelid: Secondary | ICD-10-CM | POA: Diagnosis not present

## 2020-07-31 DIAGNOSIS — H2513 Age-related nuclear cataract, bilateral: Secondary | ICD-10-CM | POA: Diagnosis not present

## 2020-07-31 DIAGNOSIS — D3132 Benign neoplasm of left choroid: Secondary | ICD-10-CM | POA: Diagnosis not present

## 2020-07-31 DIAGNOSIS — H40013 Open angle with borderline findings, low risk, bilateral: Secondary | ICD-10-CM | POA: Diagnosis not present

## 2020-07-31 DIAGNOSIS — H02834 Dermatochalasis of left upper eyelid: Secondary | ICD-10-CM | POA: Diagnosis not present

## 2020-07-31 DIAGNOSIS — H43813 Vitreous degeneration, bilateral: Secondary | ICD-10-CM | POA: Diagnosis not present

## 2020-08-16 ENCOUNTER — Other Ambulatory Visit: Payer: Self-pay | Admitting: Family Medicine

## 2020-08-24 ENCOUNTER — Encounter: Payer: Self-pay | Admitting: Family Medicine

## 2020-09-10 ENCOUNTER — Telehealth: Payer: Self-pay | Admitting: Pharmacist

## 2020-09-10 NOTE — Chronic Care Management (AMB) (Signed)
    Chronic Care Management Pharmacy Assistant   Name: Bryan [name]  MRN: [id number] DOB: 10-02-1945  Reason for Encounter: Initial Questions for Pharmacist visit on 09-11-2020 Patient Questions:   1. Have you seen any other providers since your last visit? No 2. Any changes in your medications or health? No 3. Any side effects from any medications? No 4. Do you have an symptoms or problems not managed by your medications? No 5. Any concerns about your health right now? No 6. Has your provider asked that you check blood pressure, blood sugar, or follow special diet at home? No 7. Do you get any type of exercise on a regular basis?  a. Swimming daily 8. Can you think of a goal you would like to reach for your health? No 9. Do you have any problems getting your medications? No 10. Is there anything that you would like to discuss during the appointment? No  The patient was asked to please bring medications, blood pressure/ blood sugar log, and supplements to his appointment.       Medications: Outpatient Encounter Medications as of 09/10/2020  Medication Sig  . acetaminophen (TYLENOL) 650 MG CR tablet Take 650 mg by mouth every 8 (eight) hours as needed for pain.  Marland Kitchen ALFALFA PO Take 1,200 mg by mouth daily.  Marland Kitchen b complex vitamins tablet Take 1 tablet by mouth daily.  . Cholecalciferol (VITAMIN D3) 2000 units TABS Take by mouth.  . gabapentin (NEURONTIN) 100 MG capsule TAKE 2 CAPSULES AT BEDTIME.  . Ginger, Zingiber officinalis, (GINGER PO) Take by mouth.  Marland Kitchen lisinopril-hydrochlorothiazide (ZESTORETIC) 20-25 MG tablet Take 1 tablet by mouth daily.  . meloxicam (MOBIC) 15 MG tablet TAKE 1 TABLET ONCE DAILY.  . Multiple Vitamin (MULTIVITAMIN) capsule Take 1 capsule by mouth daily.  . Multiple Vitamins-Minerals (HAIR SKIN AND NAILS FORMULA PO) Take by mouth.  Marland Kitchen OVER THE COUNTER MEDICATION Magnesium 400mg  daily  . OVER THE COUNTER MEDICATION Black elderberry daily  . tiZANidine (ZANAFLEX)  4 MG tablet TAKE 1 TABLET THREE TIMES DAILY AS NEEDED FOR MUSCLE SPASMS.  Marland Kitchen traMADol (ULTRAM) 50 MG tablet Take 1 tablet (50 mg total) by mouth every 6 (six) hours as needed for moderate pain.  . vitamin C (ASCORBIC ACID) 250 MG tablet Take 250 mg by mouth daily.   No facility-administered encounter medications on file as of 09/10/2020.    Star [facility] Drugs:  [name] 01.02.2022 [name] ([name]) [name], [name] Assistant [phone]

## 2020-09-10 NOTE — Progress Notes (Signed)
Chronic Care Management Pharmacy Note  09/13/2020 Name:  Bryan [name] MRN:  [id number] DOB:  [birth year]  Subjective: Bryan [name] is an 75 y.o. year old male who is a primary patient of Koberlein, Steele Berg, MD.  The CCM team was consulted for assistance with disease management and care coordination needs.    Engaged with patient face to face for initial visit in response to provider referral for pharmacy case management and/or care coordination services.   Consent to Services:  The patient was given the following information about Chronic Care Management services today, agreed to services, and gave verbal consent: 1. CCM service includes personalized support from designated clinical staff supervised by the primary care provider, including individualized plan of care and coordination with other care providers 2. 24/7 contact phone numbers for assistance for urgent and routine care needs. 3. Service will only be billed when office clinical staff spend 20 minutes or more in a month to coordinate care. 4. Only one practitioner may furnish and bill the service in a calendar month. 5.The patient may stop CCM services at any time (effective at the end of the month) by phone call to the office staff. 6. The patient will be responsible for cost sharing (co-pay) of up to 20% of the service fee (after annual deductible is met). Patient agreed to services and consent obtained.  Patient Care Team: Caren Macadam, MD as PCP - General (Family Medicine) Festus Aloe, MD as Consulting Physician (Urology) Danella Sensing, MD as Consulting Physician (Dermatology) Michael Boston, MD as Consulting Physician (General Surgery) Paralee Cancel, MD as Consulting Physician (Orthopedic Surgery) Jovita Gamma, MD as Consulting Physician (Neurosurgery) Viona Gilmore, [facility] as Pharmacist (Pharmacist)  Recent office visits: 06/06/20 Micheline Rough, MD: Patient presented for lumbar stenosis follow up.    03/05/20  Micheline Rough, MD: Patient presented for annual exam. Influenza vaccine administered. No changes made.  Recent consult visits: 05/29/20 Hulan Saas, DO (sports medicines): Patient presented for lumbar stenosis. No changes made.  04/11/20 Festus Aloe (urology): Patient presented for follow up. Unable to access notes.  01/17/20 Danella Sensing (dermatology): Unable to access notes.   Hospital visits: None in previous 6 months  Objective:  Lab Results  Component Value Date   CREATININE 1.13 03/05/2020   BUN 24 03/05/2020   GFR 60.81 08/31/2019   GFRNONAA >60 02/10/2018   GFRAA >60 02/10/2018   NA 138 03/05/2020   K 4.4 03/05/2020   CALCIUM 10.2 03/05/2020   CO2 31 03/05/2020   GLUCOSE 104 (H) 03/05/2020    Lab Results  Component Value Date/Time   HGBA1C 5.4 03/05/2020 08:39 AM   GFR 60.81 08/31/2019 08:45 AM   GFR 62.74 03/02/2019 08:52 AM    Last diabetic Eye exam: No results found for: HMDIABEYEEXA  Last diabetic Foot exam: No results found for: HMDIABFOOTEX   Lab Results  Component Value Date   CHOL 182 03/05/2020   HDL 43 03/05/2020   LDLCALC 107 (H) 03/05/2020   LDLDIRECT 128.0 03/02/2019   TRIG 203 (H) 03/05/2020   CHOLHDL 4.2 03/05/2020    Hepatic Function Latest Ref Rng & Units 03/05/2020 08/31/2019 03/02/2019  Total Protein 6.1 - 8.1 g/dL 6.7 6.7 6.7  Albumin 3.5 - 5.2 g/dL - 4.3 4.3  AST 10 - 35 U/L 24 23 25   ALT 9 - 46 U/L 20 19 22   Alk Phosphatase 39 - 117 U/L - 54 52  Total Bilirubin 0.2 - 1.2 mg/dL 1.2 1.3(H) 0.8  Bilirubin, Direct 0.0 - 0.3 mg/dL - - -    Lab Results  Component Value Date/Time   TSH 2.17 03/02/2019 08:52 AM   TSH 1.69 01/25/2018 10:04 AM    CBC Latest Ref Rng & Units 03/05/2020 08/31/2019 03/02/2019  WBC 3.8 - 10.8 Thousand/uL 5.2 4.9 4.4  Hemoglobin 13.2 - 17.1 g/dL 17.7(H) 17.8(H) 17.5(H)  Hematocrit 38.5 - 50.0 % 52.0(H) 52.7(H) 51.1  Platelets 140 - 400 Thousand/uL 237 223.0 198.0    Lab Results   Component Value Date/Time   VD25OH 44.89 10/12/2019 09:11 AM   VD25OH 41.57 03/02/2019 08:52 AM    Clinical ASCVD: No  The 10-year ASCVD risk score Mikey Bussing DC Jr., et al., 2013) is: 21.2%   Values used to calculate the score:     Age: 32 years     Sex: Male     Is Non-Hispanic African American: No     Diabetic: No     Tobacco smoker: No     Systolic Blood Pressure: 811 mmHg     Is BP treated: Yes     HDL Cholesterol: 43 mg/dL     Total Cholesterol: 182 mg/dL    Depression screen [facility] 2/9 03/05/2020 01/09/2020 03/02/2019  Decreased Interest 0 0 0  Down, Depressed, Hopeless 0 0 0  PHQ - 2 Score 0 0 0  Altered sleeping - 0 -  Tired, decreased energy - 0 -  Change in appetite - 0 -  Feeling bad or failure about yourself  - 0 -  Trouble concentrating - 0 -  Moving slowly or fidgety/restless - 0 -  Suicidal thoughts - 0 -  PHQ-9 Score - 0 -  Difficult doing work/chores - Not difficult at all -      Social History   Tobacco Use  Smoking Status Never Smoker  Smokeless Tobacco Never Used   BP Readings from Last 3 Encounters:  06/06/20 118/68  05/29/20 116/70  03/05/20 118/80   Pulse Readings from Last 3 Encounters:  06/06/20 66  03/05/20 68  02/28/20 82   Wt Readings from Last 3 Encounters:  06/06/20 216 lb 1.6 oz (98 kg)  05/29/20 213 lb (96.6 kg)  03/05/20 212 lb 1.6 oz (96.2 kg)   BMI Readings from Last 3 Encounters:  06/06/20 27.01 kg/m  05/29/20 26.62 kg/m  03/05/20 26.51 kg/m    Assessment/Interventions: Review of patient past medical history, allergies, medications, health status, including review of consultants reports, laboratory and other test data, was performed as part of comprehensive evaluation and provision of chronic care management services.   SDOH:  (Social Determinants of Health) assessments and interventions performed: Yes SDOH Interventions   Flowsheet Row Most Recent Value  SDOH Interventions   Financial Strain Interventions Intervention  Not Indicated  Transportation Interventions Intervention Not Indicated     SDOH Screenings   Alcohol Screen: Low Risk   . Last Alcohol Screening Score (AUDIT): 0  Depression (PHQ2-9): Low Risk   . PHQ-2 Score: 0  Financial Resource Strain: Low Risk   . Difficulty of Paying Living Expenses: Not hard at all  Food Insecurity: No Food Insecurity  . Worried About Charity fundraiser in the Last Year: Never true  . Ran Out of Food in the Last Year: Never true  Housing: Low Risk   . Last Housing Risk Score: 0  Physical Activity: Sufficiently Active  . Days of Exercise per Week: 5 days  . Minutes of Exercise per Session: 50 min  Social  Connections: Moderately Isolated  . Frequency of Communication with Friends and Family: More than three times a week  . Frequency of Social Gatherings with Friends and Family: Three times a week  . Attends Religious Services: Never  . Active Member of Clubs or Organizations: No  . Attends Archivist Meetings: Never  . Marital Status: Married  Stress: No Stress Concern Present  . Feeling of Stress : Not at all  Tobacco Use: Low Risk   . Smoking Tobacco Use: Never Smoker  . Smokeless Tobacco Use: Never Used  Transportation Needs: No Transportation Needs  . Lack of Transportation (Medical): No  . Lack of Transportation (Non-Medical): No     Patient is retired and reports his days are simple. He eats breakfast then goes to the gym every day and swims and does some stretching. Then he meets about 6-7 guys for lunch. He tries to play golf some when the weather is better. He doesn't drink, smoke or do drugs and doesn't do yardwork.  He has an extensive surgical history of 8 previous surgeries (knee, left hip replaced, achilles tendon, fractured hip, etc.) He follows with a urologist to have his PSA checked every 6 months. He has significant back pain due to arthritis.   Patient eats cereal in the morning. For lunch, he has a grilled chicken breast &  vegetables. He reports he would give himself a B rating for food. He does enjoy ice cream but doesn't try to have too much. He does eat some salads and vegetables and eats more chicken than red meat. He mostly drinks water, milk in the morning, and half and half tea at lunch and does not drink coffee or sodas.  He sleeps pretty well and thinks it may help that he takes gabapentin before bedtime. He is usually achy in the morning and uses some menthol and lidocaine products to help.  He denies any problems with his medications.   CCM Care Plan  Allergies  Allergen Reactions  . Codeine Other (See Comments)    "made me sick"    Medications Reviewed Today    Reviewed by Viona Gilmore, [facility] (Pharmacist) on 09/13/20 at 2307  Med List Status: <None>  Medication Order Taking? Sig Documenting Provider Last Dose Status Informant  acetaminophen (TYLENOL) 650 MG CR tablet [id number] Yes Take 650 mg by mouth every 8 (eight) hours as needed for pain. [provider] Taking Active   ALFALFA PO [id number] Yes Take 1,200 mg by mouth 3 (three) times daily. [provider] Taking Active   b complex vitamins tablet [id number] Yes Take 1 tablet by mouth daily. [provider] Taking Active   Cholecalciferol (VITAMIN D3) 2000 units TABS [id number] Yes Take by mouth. [provider] Taking Active Self  gabapentin (NEURONTIN) 100 MG capsule [id number] Yes TAKE 2 CAPSULES AT BEDTIME. Caren Macadam, MD Taking Active   Ginger, Zingiber officinalis, (GINGER PO) [id number] Yes Take by mouth. [provider] Taking Active   lisinopril-hydrochlorothiazide (ZESTORETIC) 20-25 MG tablet [id number] Yes Take 1 tablet by mouth daily. Caren Macadam, MD Taking Active   meloxicam (MOBIC) 15 MG tablet [id number] Yes TAKE 1 TABLET ONCE DAILY. Caren Macadam, MD Taking Active   Multiple Vitamin (MULTIVITAMIN) capsule [id number] Yes Take 1 capsule by mouth daily. [provider] Taking Active Self  Multiple Vitamins-Minerals (HAIR SKIN AND NAILS FORMULA PO) [id number] Yes Take by mouth. [provider] Taking Active   Omega-3 Fatty Acids ([place])  [place] [id number] Yes Take 1 capsule by mouth daily. [provider] Taking Active   OVER THE COUNTER MEDICATION [id number] Yes Magnesium 451m daily [provider] Taking Active   OVER [id number] Black elderberry daily [provider] Taking Active   tiZANidine (ZANAFLEX) 4 MG tablet [id number] TAKE 1 TABLET THREE TIMES DAILY AS NEEDED FOR MUSCLE SPASMS. KCaren Macadam MD Taking Active   traMADol (ULTRAM) 50 MG tablet [id number] Take 1 tablet (50 mg total) by mouth every 6 (six) hours as needed for moderate pain. KCaren Macadam MD Taking Active   vitamin C (ASCORBIC ACID) 250 MG tablet [id number] Take 250 mg by mouth daily. [provider] Taking Active           Patient Active Problem List   Diagnosis Date Noted  . Right groin pain 09/07/2019  . S/P right THA, AA 02/09/2018  . S/P hip replacement 02/09/2018  . Lumbar stenosis with neurogenic claudication 11/11/2017  . PROSTATE SPECIFIC ANTIGEN, ELEVATED 07/19/2009  . BACK PAIN 06/11/2007  . Essential hypertension 05/21/2007  . Osteoarthritis 05/21/2007    Immunization History  Administered Date(s) Administered  . Fluad Quad(high Dose 65+) 03/02/2019, 03/05/2020  . Influenza Split 03/30/2013  . Influenza, High Dose Seasonal PF 03/26/2015, 03/25/2016, 04/13/2017, 03/23/2018  . Influenza,inj,Quad PF,6+ Mos 03/21/2014  . PFIZER(Purple Top)SARS-COV-2 Vaccination 07/10/2019, 08/01/2019, 03/31/2020  . Pneumococcal Conjugate-13 03/21/2014  . Pneumococcal Polysaccharide-23 03/26/2015  . Td 07/19/2009    Conditions to be addressed/monitored:  Hypertension, Hyperlipidemia and Osteoarthritis  Care Plan : [place] Updates made by PViona Gilmore RCrawfordsince 09/13/2020 12:00 AM    Problem: Problem: hypertension, osteoarthritis, pain     Long-Range Goal: Patient-Specific Goal   Start Date: 09/11/2020  Expected End Date: 09/11/2021  This Visit's Progress: On track  Priority: High  Note:   Current Barriers:  . Unable to independently monitor therapeutic efficacy  Pharmacist Clinical Goal(s):  .Marland KitchenPatient will achieve adherence to monitoring guidelines and medication adherence to achieve therapeutic efficacy through collaboration with PharmD and provider.    Interventions: . 1:1 collaboration with KCaren Macadam MD regarding development and update of comprehensive plan of care as evidenced by provider attestation and co-signature . Inter-disciplinary care team collaboration (see longitudinal plan of care) . Comprehensive medication review performed; medication list updated in electronic medical record  Hypertension (BP goal <140/90) -Controlled -Current treatment: . Lisinopril-hydrochlorothiazide 20-25 mg 1 tablet daily -Medications previously tried: none  -Current home readings: does not check at home but does have a BP cuff -Current dietary habits: does not use salt at all except for watermelon and cantaloupe  -Current exercise habits: swimming and at the gym daily -Denies hypotensive/hypertensive symptoms -Educated on Exercise goal of 150 minutes per week; Importance of home blood pressure monitoring; Proper BP monitoring technique; -Counseled to monitor BP at home weekly, document, and provide log at future appointments -Counseled on diet and exercise extensively Recommended to continue current medication  Osteoarthritis/pain (Goal: minimize pain) -Not ideally controlled -Current treatment  . Meloxicam 15 mg 1 tablet daily . Tramadol 50 mg 1 tablet every 6 hours as needed for pain (2 per day) . Tizanidine 4 mg 1 tablet three times daily as needed (sometimes doesn't take it every day - seldom  using) . Gabapentin 100 mg 2 capsules at bedtime . Acetaminophen 650 mg 1 tablet as needed for pain (2 times a day) -Medications previously tried:  n/a  -Counseled on limiting use of NSAIDs due to elevating BP  Health Maintenance -Vaccine gaps: shinges -Current therapy:  . Multivitamin 1 capsule daily . Hair skin and nails vitamin 1 tablet daily . Vitamin C 250 mg 1 tablet daily . Vitamin B complex 1 tablet daily . Vitamin D 2000 units 1 tablet daily . Ginger 1 tablet daily . Alfalfa 1200 mg daily . Magnesium 400 mg daily . Black elderberry daily . Fish oil daily -Educated on Herbal supplement research is limited and benefits usually cannot be proven Cost vs benefit of each product must be carefully weighed by individual consumer -Patient is satisfied with current therapy and denies issues -Recommended to continue current medication  Patient Goals/Self-Care Activities . Patient will:  - take medications as prescribed check blood pressure weekly, document, and provide at future appointments target a minimum of 150 minutes of moderate intensity exercise weekly  Follow Up Plan: Telephone follow up appointment with care management team member scheduled for: 6 months       Medication Assistance: None required.  Patient affirms current coverage meets needs.  Patient's preferred pharmacy is:  [facility], [place] [address] Phone: [phone] Fax: [phone]  Uses pill box? No - doesn't need one Pt endorses 100% compliance  We discussed: Current pharmacy is preferred with insurance plan and patient is satisfied with pharmacy services Patient decided to: Continue current medication management strategy  Care Plan and Follow Up Patient Decision:  Patient agrees to Care Plan and Follow-up.  Plan: Telephone follow up appointment with care management team member scheduled for:  6  months  Jeni Salles, PharmD [place] Pharmacist [place] [facility] [place] [phone]

## 2020-09-11 ENCOUNTER — Ambulatory Visit (INDEPENDENT_AMBULATORY_CARE_PROVIDER_SITE_OTHER): Payer: Medicare Other | Admitting: Pharmacist

## 2020-09-11 ENCOUNTER — Other Ambulatory Visit: Payer: Self-pay

## 2020-09-11 DIAGNOSIS — M1990 Unspecified osteoarthritis, unspecified site: Secondary | ICD-10-CM | POA: Diagnosis not present

## 2020-09-11 DIAGNOSIS — I10 Essential (primary) hypertension: Secondary | ICD-10-CM

## 2020-09-13 NOTE — Patient Instructions (Addendum)
Hi Bryan [name],  It was great to get to meet you in person! Below is a summary of some of the topics we discussed. As a reminder, don't forget to look into getting your shingles shot at the pharmacy. Also, don't forget to start checking your blood pressure once a week to make sure your medications are working properly.   Please reach out to me if you have any questions or need anything before our follow up!  Best, Maddie  Jeni Salles, PharmD, [facility] [phone]  Visit Information  Goals Addressed   None    Patient Care Plan: CCM Pharmacy Care  Plan    Problem Identified: Problem: hypertension, osteoarthritis, pain     Long-Range Goal: Patient-Specific Goal   Start Date: 09/11/2020  Expected End Date: 09/11/2021  This Visit's Progress: On track  Priority: High  Note:   Current Barriers:  . Unable to independently monitor therapeutic efficacy  Pharmacist Clinical Goal(s):  Marland Kitchen Patient will achieve adherence to monitoring guidelines and medication adherence to achieve therapeutic efficacy through collaboration with PharmD and provider.    Interventions: . 1:1 collaboration with Caren Macadam, MD regarding development and update of comprehensive plan of care as evidenced by provider attestation and co-signature . Inter-disciplinary care team collaboration (see longitudinal plan of care) . Comprehensive medication review performed; medication list updated in electronic medical record  Hypertension (BP goal <140/90) -Controlled -Current treatment: . Lisinopril-hydrochlorothiazide 20-25 mg 1 tablet daily -Medications previously tried: none  -Current home readings: does not check at home but does have a BP cuff -Current dietary habits: does not use salt at all except for watermelon and cantaloupe  -Current exercise habits: swimming and at the gym daily -Denies hypotensive/hypertensive symptoms -Educated on Exercise goal of  150 minutes per week; Importance of home blood pressure monitoring; Proper BP monitoring technique; -Counseled to monitor BP at home weekly, document, and provide log at future appointments -Counseled on diet and exercise extensively Recommended to continue current medication  Osteoarthritis/pain (Goal: minimize pain) -Not ideally controlled -Current treatment  . Meloxicam 15 mg 1 tablet daily . Tramadol 50 mg 1 tablet every 6 hours as needed for pain (2 per day) . Tizanidine 4 mg 1 tablet three times daily as needed (sometimes doesn't take it every day - seldom using) . Gabapentin 100 mg 2 capsules at bedtime . Acetaminophen 650 mg 1 tablet as needed for pain (2 times a day) -Medications previously tried: n/a  -Counseled on limiting use of NSAIDs due to elevating BP  Health Maintenance -Vaccine gaps: shinges -Current therapy:  . Multivitamin 1 capsule daily . Hair skin and nails vitamin 1 tablet daily . Vitamin C 250 mg 1 tablet daily . Vitamin B complex 1 tablet daily . Vitamin D 2000 units 1 tablet daily . Ginger 1 tablet daily . Alfalfa 1200 mg daily . Magnesium 400 mg daily . Black elderberry daily . Fish oil daily -Educated on Herbal supplement research is limited and benefits usually cannot be proven Cost vs benefit of each product must be carefully weighed by individual consumer -Patient is satisfied with current therapy and denies issues -Recommended to continue current medication  Patient Goals/Self-Care Activities . Patient will:  - take medications as prescribed check blood pressure weekly, document, and provide at future appointments target a minimum of 150 minutes of moderate intensity exercise weekly  Follow Up Plan: Telephone follow up appointment with care management team member scheduled for: 6 months  Bryan [name] was given information about Chronic Care Management services today including:  1. CCM service includes personalized support from  designated clinical staff supervised by his physician, including individualized plan of care and coordination with other care providers 2. 24/7 contact phone numbers for assistance for urgent and routine care needs. 3. Standard insurance, coinsurance, copays and deductibles apply for chronic care management only during months in which we provide at least 20 minutes of these services. Most insurances cover these services at 100%, however patients may be responsible for any copay, coinsurance and/or deductible if applicable. This service may help you avoid the need for more expensive face-to-face services. 4. Only one practitioner may furnish and bill the service in a calendar month. 5. The patient may stop CCM services at any time (effective at the end of the month) by phone call to the office staff.  Patient agreed to services and verbal consent obtained.   The patient verbalized understanding of instructions, educational materials, and care plan provided today and declined offer to receive copy of patient instructions, educational materials, and care plan.  Telephone follow up appointment with pharmacy team member scheduled for:  Viona Gilmore, [facility]  How to Take Your Blood Pressure Blood pressure measures how strongly your blood is pressing against the walls of your arteries. Arteries are blood vessels that carry blood from your heart throughout your body. You can take your blood pressure at home with a machine. You may need to check your blood pressure at home:  To check if you have high blood pressure (hypertension).  To check your blood pressure over time.  To make sure your blood pressure medicine is working. Supplies needed:  Blood pressure machine, or monitor.  Dining room chair to sit in.  Table or desk.  Small notebook.  Pencil or pen. How to prepare Avoid these things for 30 minutes before checking your blood pressure:  Having drinks with caffeine in them, such as  coffee or tea.  Drinking alcohol.  Eating.  Smoking.  Exercising. Do these things five minutes before checking your blood pressure:  Go to the bathroom and pee (urinate).  Sit in a dining chair. Do not sit in a soft couch or an armchair.  Be quiet. Do not talk. How to take your blood pressure Follow the instructions that came with your machine. If you have a digital blood pressure monitor, these may be the instructions: 1. Sit up straight. 2. Place your feet on the floor. Do not cross your ankles or legs. 3. Rest your left arm at the level of your heart. You may rest it on a table, desk, or chair. 4. Pull up your shirt sleeve. 5. Wrap the blood pressure cuff around the upper part of your left arm. The cuff should be 1 inch (2.5 cm) above your elbow. It is best to wrap the cuff around bare skin. 6. Fit the cuff snugly around your arm. You should be able to place only one finger between the cuff and your arm. 7. Place the cord so that it rests in the bend of your elbow. 8. Press the power button. 9. Sit quietly while the cuff fills with air and loses air. 10. Write down the numbers on the screen. 11. Wait 2-3 minutes and then repeat steps 1-10.   What do the numbers mean? Two numbers make up your blood pressure. The first number is called systolic pressure. The second is called diastolic pressure. An example of a blood pressure  reading is "120 over 80" (or 120/80). If you are an adult and do not have a medical condition, use this guide to find out if your blood pressure is normal: Normal  First number: below 120.  Second number: below 80. Elevated  First number: 120-129.  Second number: below 80. Hypertension stage 1  First number: 130-139.  Second number: 80-89. Hypertension stage 2  First number: 140 or above.  Second number: 5 or above. Your blood pressure is above normal even if only the top or bottom number is above normal. Follow these instructions at  home:  Check your blood pressure as often as your doctor tells you to.  Check your blood pressure at the same time every day.  Take your monitor to your next doctor's appointment. Your doctor will: ? Make sure you are using it correctly. ? Make sure it is working right.  Make sure you understand what your blood pressure numbers should be.  Tell your doctor if your medicine is causing side effects.  Keep all follow-up visits as told by your doctor. This is important. General tips:  You will need a blood pressure machine, or monitor. Your doctor can suggest a monitor. You can buy one at a drugstore or online. When choosing one: ? Choose one with an arm cuff. ? Choose one that wraps around your upper arm. Only one finger should fit between your arm and the cuff. ? Do not choose one that measures your blood pressure from your wrist or finger. Where to find more information American Heart Association: www.heart.org Contact a doctor if:  Your blood pressure keeps being high. Get help right away if:  Your first blood pressure number is higher than 180.  Your second blood pressure number is higher than 120. Summary  Check your blood pressure at the same time every day.  Avoid caffeine, alcohol, smoking, and exercise for 30 minutes before checking your blood pressure.  Make sure you understand what your blood pressure numbers should be. This information is not intended to replace advice given to you by your health care provider. Make sure you discuss any questions you have with your health care provider. Document Revised: 05/27/2019 Document Reviewed: 05/27/2019 Elsevier Patient Education  2021 [employer].

## 2020-09-14 ENCOUNTER — Other Ambulatory Visit: Payer: Self-pay | Admitting: Family Medicine

## 2020-09-14 DIAGNOSIS — I10 Essential (primary) hypertension: Secondary | ICD-10-CM

## 2020-09-19 ENCOUNTER — Other Ambulatory Visit: Payer: Self-pay | Admitting: Family Medicine

## 2020-09-19 DIAGNOSIS — I10 Essential (primary) hypertension: Secondary | ICD-10-CM

## 2020-09-20 LAB — PSA: PSA: 6.37

## 2020-09-27 DIAGNOSIS — R350 Frequency of micturition: Secondary | ICD-10-CM | POA: Diagnosis not present

## 2020-10-02 ENCOUNTER — Other Ambulatory Visit: Payer: Self-pay | Admitting: Family Medicine

## 2020-11-12 ENCOUNTER — Other Ambulatory Visit: Payer: Self-pay | Admitting: Family Medicine

## 2020-11-12 DIAGNOSIS — Z8781 Personal history of (healed) traumatic fracture: Secondary | ICD-10-CM

## 2020-11-12 NOTE — Progress Notes (Signed)
Bone density ordered due to hx of hip fracture (noted while in surgery with ortho for hip replacement)

## 2020-11-15 ENCOUNTER — Telehealth: Payer: Self-pay | Admitting: Pharmacist

## 2020-11-15 NOTE — Chronic Care Management (AMB) (Signed)
Chronic Care Management Pharmacy Assistant   Name: Bryan [name]  MRN: [id number] DOB: [birth year]  Reason for Encounter: Disease State/ General Assessment Call.    Conditions to be addressed/monitored: HTN and and Osteoarthritis   Recent office visits:  None.   Recent consult visits:  None.   Hospital visits:  None in previous 6 months  Medications: Outpatient Encounter Medications as of 11/15/2020  Medication Sig  . acetaminophen (TYLENOL) 650 MG CR tablet Take 650 mg by mouth every 8 (eight) hours as needed for pain.  Marland Kitchen ALFALFA PO Take 1,200 mg by mouth 3 (three) times daily.  Marland Kitchen b complex vitamins tablet Take 1 tablet by mouth daily.  . Cholecalciferol (VITAMIN D3) 2000 units TABS Take by mouth.  . gabapentin (NEURONTIN) 100 MG capsule TAKE 2 CAPSULES AT BEDTIME.  . Ginger, Zingiber officinalis, (GINGER PO) Take by mouth.  Marland Kitchen lisinopril-hydrochlorothiazide (ZESTORETIC) 20-25 MG tablet TAKE (1) TABLET DAILY.  . meloxicam (MOBIC) 15 MG tablet TAKE 1 TABLET ONCE DAILY.  . Multiple Vitamin (MULTIVITAMIN) capsule Take 1 capsule by mouth daily.  . Multiple Vitamins-Minerals (HAIR SKIN AND NAILS FORMULA PO) Take by mouth.  . Omega-3 Fatty Acids (FISH OIL) 1200 MG CAPS Take 1 capsule by mouth daily.  Marland Kitchen OVER THE COUNTER MEDICATION Magnesium 400mg  daily  . OVER THE COUNTER MEDICATION Black elderberry daily  . tiZANidine (ZANAFLEX) 4 MG tablet TAKE 1 TABLET THREE TIMES DAILY AS NEEDED FOR MUSCLE SPASMS.  Marland Kitchen traMADol (ULTRAM) 50 MG tablet Take 1 tablet (50 mg total) by mouth every 6 (six) hours as needed for moderate pain.  . vitamin C (ASCORBIC ACID) 250 MG tablet Take 250 mg by mouth daily.   No facility-administered encounter medications on file as of 11/15/2020.    Reviewed chart prior to disease state call. Spoke with patient regarding BP  Recent Office Vitals: BP Readings from Last 3 Encounters:  06/06/20 118/68  05/29/20 116/70  03/05/20 118/80   Pulse Readings from  Last 3 Encounters:  06/06/20 66  03/05/20 68  02/28/20 82    Wt Readings from Last 3 Encounters:  06/06/20 216 lb 1.6 oz (98 kg)  05/29/20 213 lb (96.6 kg)  03/05/20 212 lb 1.6 oz (96.2 kg)     Kidney Function Lab Results  Component Value Date/Time   CREATININE 1.13 03/05/2020 08:39 AM   CREATININE 1.18 08/31/2019 08:45 AM   CREATININE 1.15 03/02/2019 08:52 AM   GFR 60.81 08/31/2019 08:45 AM   GFRNONAA >60 02/10/2018 04:53 AM   GFRAA >60 02/10/2018 04:53 AM    BMP Latest Ref Rng & Units 03/05/2020 08/31/2019 03/02/2019  Glucose 65 - 99 mg/dL 104(H) 102(H) 105(H)  BUN 7 - 25 mg/dL 24 26(H) 22  Creatinine 0.70 - 1.18 mg/dL 1.13 1.18 1.15  BUN/Creat Ratio 6 - 22 (calc) NOT APPLICABLE - -  Sodium 193 - 146 mmol/L 138 137 140  Potassium 3.5 - 5.3 mmol/L 4.4 4.0 4.3  Chloride 98 - 110 mmol/L 101 99 102  CO2 20 - 32 mmol/L 31 32 30  Calcium 8.6 - 10.3 mg/dL 10.2 10.4 10.1    . Current antihypertensive regimen:   Lisinopril-hydrochlorothiazide 20-25 mg 1 tablet daily . How often are you checking your Blood Pressure? infrequently . Current home BP readings: no readings to report.  . What recent interventions/DTPs have been made by any provider to improve Blood Pressure control since last CPP Visit: None.  . Any recent hospitalizations or ED visits since last visit  with CPP? No . What diet changes have been made to improve Blood Pressure Control?  o Patient stated that he does not eat a lot of salt or red meat like pork. Patient states he rinses all canned vegetables and tends to eat pretty healthy for the most part. Patient stated his wife cooks some.  . What exercise is being done to improve your Blood Pressure Control?  o Patient stated that he goes to the gym or swims everyday for at least 30 minutes. Patient stated he's going to play golf tomorrow and has no problems walking or being active. Patient stated he also does things around home with no issues.   Adherence Review: Is  the patient currently on ACE/ARB medication? Yes Does the patient have >5 day gap between last estimated fill dates? No   Reviewed chart prior to disease state call. Spoke with patient regarding BP  Recent Office Vitals: BP Readings from Last 3 Encounters:  06/06/20 118/68  05/29/20 116/70  03/05/20 118/80   Pulse Readings from Last 3 Encounters:  06/06/20 66  03/05/20 68  02/28/20 82    Wt Readings from Last 3 Encounters:  06/06/20 216 lb 1.6 oz (98 kg)  05/29/20 213 lb (96.6 kg)  03/05/20 212 lb 1.6 oz (96.2 kg)     Kidney Function Lab Results  Component Value Date/Time   CREATININE 1.13 03/05/2020 08:39 AM   CREATININE 1.18 08/31/2019 08:45 AM   CREATININE 1.15 03/02/2019 08:52 AM   GFR 60.81 08/31/2019 08:45 AM   GFRNONAA >60 02/10/2018 04:53 AM   GFRAA >60 02/10/2018 04:53 AM    BMP Latest Ref Rng & Units 03/05/2020 08/31/2019 03/02/2019  Glucose 65 - 99 mg/dL 104(H) 102(H) 105(H)  BUN 7 - 25 mg/dL 24 26(H) 22  Creatinine 0.70 - 1.18 mg/dL 1.13 1.18 1.15  BUN/Creat Ratio 6 - 22 (calc) NOT APPLICABLE - -  Sodium 564 - 146 mmol/L 138 137 140  Potassium 3.5 - 5.3 mmol/L 4.4 4.0 4.3  Chloride 98 - 110 mmol/L 101 99 102  CO2 20 - 32 mmol/L 31 32 30  Calcium 8.6 - 10.3 mg/dL 10.2 10.4 10.1    . Current Osteoarthritis regimen:   Meloxicam 15 mg 1 tablet daily  Tramadol 50 mg 1 tablet every 6 hours as needed for pain (2 per day)  Tizanidine 4 mg 1 tablet three times daily as needed (sometimes doesn't take it every day - seldom using)  Gabapentin 100 mg 2 capsules at bedtime  Acetaminophen 650 mg 1 tablet as needed for pain (2 times a day) . What recent interventions/DTPs have been made by any provider to improve Blood Pressure control since last CPP Visit: None.  . Any recent hospitalizations or ED visits since last visit with CPP? No . What diet changes have been made to improve Blood Pressure Control?  o Patient stated that he does not eat a lot of salt or red  meat like pork. Patient states he rinses all canned vegetables and tends to eat pretty healthy for the most part. Patient stated his wife cooks some.   . What exercise is being done to improve your Blood Pressure Control?  o Patient stated that he goes to the gym or swims everyday for at least 30 minutes. Patient stated he's going to play golf tomorrow and has no problems walking or being active. Patient stated he also does things around home with no issues.    Adherence Review: Is the patient currently on  ACE/ARB medication? Yes Does the patient have >5 day gap between last estimated fill dates? No  Notes:  Spoke with patient and reviewed medications as listed. Patient reports taking all medications as prescribed except for he has never taken ALFALFA or any medication three times a day. He would like for it to be removed from his list. Patient reports no issues with his medications at this time. Patient states he has not checked his blood pressure ion a while. I re encouraged patient to start checking his blood pressure at least once a weekly and write down the readings. Patient was agreeable and verbalized understanding. Patient stated while on phone he needs to cancel his 03/11/21 appointment with Jeni Salles and reschedule appointment 03/12/21 at 4pm by phone. Message sent to Jeni Salles to cancel and reschedule.   Star Rating Drugs:   Lisinopril- HCTZ 20-25mg  - last filled on 10/02/20 90DS at [facility].  [place]  Clinical Pharmacist Assistant [phone]

## 2020-11-26 ENCOUNTER — Other Ambulatory Visit: Payer: Self-pay | Admitting: Family Medicine

## 2020-11-28 ENCOUNTER — Ambulatory Visit (INDEPENDENT_AMBULATORY_CARE_PROVIDER_SITE_OTHER)
Admission: RE | Admit: 2020-11-28 | Discharge: 2020-11-28 | Disposition: A | Discharge status: Home / Self Care | Payer: Medicare Other | Source: Ambulatory Visit | Attending: Family Medicine | Admitting: Family Medicine

## 2020-11-28 ENCOUNTER — Other Ambulatory Visit: Payer: Self-pay

## 2020-11-28 DIAGNOSIS — Z8781 Personal history of (healed) traumatic fracture: Secondary | ICD-10-CM | POA: Diagnosis not present

## 2020-11-28 DIAGNOSIS — Z13820 Encounter for screening for osteoporosis: Secondary | ICD-10-CM

## 2020-12-10 ENCOUNTER — Encounter: Payer: Medicare Other | Admitting: Family Medicine

## 2020-12-19 ENCOUNTER — Ambulatory Visit (INDEPENDENT_AMBULATORY_CARE_PROVIDER_SITE_OTHER): Payer: Medicare Other | Admitting: Family Medicine

## 2020-12-19 ENCOUNTER — Other Ambulatory Visit: Payer: Self-pay

## 2020-12-19 VITALS — BP 108/60 | HR 86 | Temp 98.5°F | Ht 75.0 in | Wt 211.4 lb

## 2020-12-19 DIAGNOSIS — M1990 Unspecified osteoarthritis, unspecified site: Secondary | ICD-10-CM

## 2020-12-19 DIAGNOSIS — Z13220 Encounter for screening for lipoid disorders: Secondary | ICD-10-CM | POA: Diagnosis not present

## 2020-12-19 DIAGNOSIS — I10 Essential (primary) hypertension: Secondary | ICD-10-CM

## 2020-12-19 DIAGNOSIS — Z0000 Encounter for general adult medical examination without abnormal findings: Secondary | ICD-10-CM

## 2020-12-19 LAB — COMPREHENSIVE METABOLIC PANEL
ALT: 20 U/L (ref 0–53)
AST: 23 U/L (ref 0–37)
Albumin: 4.5 g/dL (ref 3.5–5.2)
Alkaline Phosphatase: 47 U/L (ref 39–117)
BUN: 31 mg/dL — ABNORMAL HIGH (ref 6–23)
CO2: 31 mEq/L (ref 19–32)
Calcium: 10.3 mg/dL (ref 8.4–10.5)
Chloride: 101 mEq/L (ref 96–112)
Creatinine, Ser: 1.35 mg/dL (ref 0.40–1.50)
GFR: 52.44 mL/min — ABNORMAL LOW (ref >60.00)
Glucose, Bld: 104 mg/dL — ABNORMAL HIGH (ref 70–99)
Potassium: 4.5 mEq/L (ref 3.5–5.1)
Sodium: 139 mEq/L (ref 135–145)
Total Bilirubin: 1 mg/dL (ref 0.2–1.2)
Total Protein: 6.9 g/dL (ref 6.0–8.3)

## 2020-12-19 LAB — CBC WITH DIFFERENTIAL/PLATELET
Basophils Absolute: 0 10*3/uL (ref 0.0–0.1)
Basophils Relative: 0.8 % (ref 0.0–3.0)
Eosinophils Absolute: 0.1 10*3/uL (ref 0.0–0.7)
Eosinophils Relative: 2.5 % (ref 0.0–5.0)
HCT: 51.3 % (ref 39.0–52.0)
Hemoglobin: 17.4 g/dL — ABNORMAL HIGH (ref 13.0–17.0)
Lymphocytes Relative: 21.6 % (ref 12.0–46.0)
Lymphs Abs: 1.1 10*3/uL (ref 0.7–4.0)
MCHC: 33.9 g/dL (ref 30.0–36.0)
MCV: 92.6 fl (ref 78.0–100.0)
Monocytes Absolute: 0.4 10*3/uL (ref 0.1–1.0)
Monocytes Relative: 7.6 % (ref 3.0–12.0)
Neutro Abs: 3.6 10*3/uL (ref 1.4–7.7)
Neutrophils Relative %: 67.5 % (ref 43.0–77.0)
Platelets: 229 10*3/uL (ref 150.0–400.0)
RBC: 5.55 Mil/uL (ref 4.22–5.81)
RDW: 13.4 % (ref 11.5–15.5)
WBC: 5.3 10*3/uL (ref 4.0–10.5)

## 2020-12-19 LAB — LIPID PANEL
Cholesterol: 206 mg/dL — ABNORMAL HIGH (ref 0–200)
HDL: 42.6 mg/dL (ref >39.00)
LDL Cholesterol: 124 mg/dL — ABNORMAL HIGH (ref 0–99)
NonHDL: 163.54
Total CHOL/HDL Ratio: 5
Triglycerides: 199 mg/dL — ABNORMAL HIGH (ref 0.0–149.0)
VLDL: 39.8 mg/dL (ref 0.0–40.0)

## 2020-12-19 MED ORDER — GABAPENTIN 100 MG PO CAPS: ORAL_CAPSULE | ORAL | 1 refills | Status: DC

## 2020-12-19 MED ORDER — TRAMADOL HCL 50 MG PO TABS: 50.0000 mg | ORAL_TABLET | Freq: Four times a day (QID) | ORAL | 2 refills | Status: DC | PRN

## 2020-12-19 NOTE — Addendum Note (Signed)
Addended by: Elmer Picker on: 12/19/2020 09:50 AM   Modules accepted: Orders

## 2020-12-19 NOTE — Progress Notes (Signed)
Bryan [name] DOB: [birth year] Encounter date: 12/19/2020  This is a 75 y.o. male who presents for complete physical   History of present illness/Additional concerns: HTN: lisinopril-hctz 20-25mg  daily. Doesn't check at home. Takes bp meds in morning; already had them today.   Had bone density study completed at [facility]. Normal distal radius bone density. Images limited in lower back/hips due to surgical hx.   Still with daily back and hip pain; worse after golf which he enjoys. Stays active. Tramadol helps with pain - takes this and meloxicam in morning and then takes another tramadol in evening. Sleeps through night. Takes XS tylenol as needed. Takes gabapentin in evening as well - closer to bedtime. Muscle relaxer just for occasional use. Swims daily.   Following with ophtho for vitreous degeneration Following with derm regularly (Dr. Ronnald Ramp) Following with urology for PSA monitoring (Dr. Junious Silk)  Last colonoscopy 10/2012. Follow up in 10 years.  Past Medical History:  Diagnosis Date   Anxiety    Cancer ([place])    facial skin cancer   Depression    GERD (gastroesophageal reflux disease)    takes tums rarely   Hematuria    1 time in july 2019   Hypertension    Osteoarthritis    Spinal stenosis    Past Surgical History:  Procedure Laterality Date   ACHILLES TENDON SURGERY  02/2006   ANTERIOR LAT LUMBAR FUSION N/A 11/11/2017   Procedure: LUMBAR TWO- LUMBAR THREE, LUMBAR THREE- LUMBAR FOUR, LUMBAR FOUR- LUMBAR FIVE ANTERIOLATERAL INTERBODY ARTHRODESIS;  Surgeon: Jovita Gamma, MD;  Location: [place];  Service: Neurosurgery;  Laterality: N/A;   COLONOSCOPY  2003   COLONOSCOPY     CYSTOSCOPY N/A 05/21/2018   Procedure: CYSTOSCOPY FLEXIBLE;  Surgeon: Festus Aloe, MD;  [facility];  Service: Urology;  Laterality: N/A;   KNEE SURGERY Left 1991 and 2001   X2 arthroscopy   LUMBAR PERCUTANEOUS PEDICLE SCREW 3 LEVEL N/A 11/11/2017   Procedure: LUMBAR  PERCUTANEOUS PEDICLE SCREW PLACEMENT LUMBAR TWO-THREE, LUMBER THREE-FOUR, LUMBAR FOUR-FIVE;  Surgeon: Jovita Gamma, MD;  Location: [place];  Service: Neurosurgery;  Laterality: N/A;   SPINE SURGERY  12-08, 5-10   TOTAL HIP ARTHROPLASTY     oct 2006,   TOTAL HIP ARTHROPLASTY Right 02/09/2018   Procedure: RIGHT TOTAL HIP ARTHROPLASTY ANTERIOR APPROACH;  Surgeon: Paralee Cancel, MD;  [facility];  Service: Orthopedics;  Laterality: Right;   Allergies  Allergen Reactions   Codeine Other (See Comments)    "made me sick"   Current Meds  Medication Sig   acetaminophen (TYLENOL) 650 MG CR tablet Take 650 mg by mouth every 8 (eight) hours as needed for pain.   b complex vitamins tablet Take 1 tablet by mouth daily.   lisinopril-hydrochlorothiazide (ZESTORETIC) 20-25 MG tablet TAKE (1) TABLET DAILY.   meloxicam (MOBIC) 15 MG tablet TAKE 1 TABLET ONCE DAILY.   Multiple Vitamins-Minerals (HAIR SKIN AND NAILS FORMULA PO) Take by mouth.   Omega-3 Fatty Acids (FISH OIL) 1200 MG CAPS Take 1 capsule by mouth daily.   tiZANidine (ZANAFLEX) 4 MG tablet TAKE 1 TABLET THREE TIMES DAILY AS NEEDED FOR MUSCLE SPASMS.   vitamin C (ASCORBIC ACID) 250 MG tablet Take 250 mg by mouth daily.   [DISCONTINUED] gabapentin (NEURONTIN) 100 MG capsule TAKE TWO CAPSULES AT BEDTIME. **NEED OFFICE VISIT**   [DISCONTINUED] traMADol (ULTRAM) 50 MG tablet Take 1 tablet (50 mg total) by mouth every 6 (six) hours as needed for moderate pain.  Social History   Tobacco Use   Smoking status: Never   Smokeless tobacco: Never  Substance Use Topics   Alcohol use: No   Family History  Problem Relation Age of Onset   Diabetes Mother    Coronary artery disease Mother 27   Hypertension Mother    Other Father 101   Colon polyps Neg Hx    Esophageal cancer Neg Hx    Rectal cancer Neg Hx    Stomach cancer Neg Hx      Review of Systems  Constitutional:  Negative for activity change, appetite change, chills, fatigue,  fever and unexpected weight change.  HENT:  Negative for congestion, ear pain, hearing loss, sinus pressure, sinus pain, sore throat and trouble swallowing.   Eyes:  Negative for pain and visual disturbance.  Respiratory:  Negative for cough, chest tightness, shortness of breath and wheezing.   Cardiovascular:  Negative for chest pain, palpitations and leg swelling.  Gastrointestinal:  Negative for abdominal distention, abdominal pain, blood in stool, constipation, diarrhea, nausea and vomiting.  Genitourinary:  Negative for decreased urine volume, difficulty urinating, dysuria, penile pain and testicular pain.  Musculoskeletal:  Positive for arthralgias (hips) and back pain. Negative for joint swelling.  Skin:  Negative for rash.  Neurological:  Negative for dizziness, weakness, numbness and headaches.  Hematological:  Negative for adenopathy. Does not bruise/bleed easily.  Psychiatric/Behavioral:  Negative for agitation, sleep disturbance and suicidal ideas. The patient is not nervous/anxious.    CBC:  Lab Results  Component Value Date   WBC 5.2 03/05/2020   HGB 17.7 (H) 03/05/2020   HCT 52.0 (H) 03/05/2020   MCH 31.6 03/05/2020   MCHC 34.0 03/05/2020   RDW 12.6 03/05/2020   PLT 237 03/05/2020   MPV 9.4 03/05/2020   MPV 7.2 07/29/2015   CMP: Lab Results  Component Value Date   NA 138 03/05/2020   K 4.4 03/05/2020   CL 101 03/05/2020   CO2 31 03/05/2020   ANIONGAP 7 02/10/2018   GLUCOSE 104 (H) 03/05/2020   GLUCOSE 98 05/26/2006   BUN 24 03/05/2020   CREATININE 1.13 03/05/2020   GFRAA >60 02/10/2018   CALCIUM 10.2 03/05/2020   PROT 6.7 03/05/2020   BILITOT 1.2 03/05/2020   ALKPHOS 54 08/31/2019   ALT 20 03/05/2020   AST 24 03/05/2020   LIPID: Lab Results  Component Value Date   CHOL 182 03/05/2020   TRIG 203 (H) 03/05/2020   TRIG 95 05/26/2006   HDL 43 03/05/2020   LDLCALC 107 (H) 03/05/2020    Objective:  BP 108/60 (BP Location: Right Arm, Patient Position:  Sitting, Cuff Size: Normal)   Pulse 86   Temp 98.5 F (36.9 C)   Ht 6\' 3"  (1.905 m)   Wt 211 lb 6.4 oz (95.9 kg)   SpO2 99%   BMI 26.42 kg/m   Weight: 211 lb 6.4 oz (95.9 kg)   BP Readings from Last 3 Encounters:  12/19/20 108/60  06/06/20 118/68  05/29/20 116/70   Wt Readings from Last 3 Encounters:  12/19/20 211 lb 6.4 oz (95.9 kg)  06/06/20 216 lb 1.6 oz (98 kg)  05/29/20 213 lb (96.6 kg)    Physical Exam Constitutional:      General: He is not in acute distress.    Appearance: He is well-developed.  HENT:     Head: Normocephalic and atraumatic.     Right Ear: External ear normal.     Left Ear: External ear normal.  Nose: Nose normal.     Mouth/Throat:     Pharynx: No oropharyngeal exudate.  Eyes:     Conjunctiva/sclera: Conjunctivae normal.     Pupils: Pupils are equal, round, and reactive to light.  Neck:     Thyroid: No thyromegaly.  Cardiovascular:     Rate and Rhythm: Normal rate and regular rhythm.     Heart sounds: Normal heart sounds. No murmur heard.   No friction rub. No gallop.  Pulmonary:     Effort: Pulmonary effort is normal. No respiratory distress.     Breath sounds: Normal breath sounds. No stridor. No wheezing or rales.  Abdominal:     General: Bowel sounds are normal.     Palpations: Abdomen is soft.  Musculoskeletal:        General: Normal range of motion.     Cervical back: Neck supple.  Skin:    General: Skin is warm and dry.  Neurological:     Mental Status: He is alert and oriented to person, place, and time.  Psychiatric:        Behavior: Behavior normal.        Thought Content: Thought content normal.        Judgment: Judgment normal.    Assessment/Plan: There are no preventive care reminders to display for this patient.  Health Maintenance reviewed - suggest 4th covid shot due to age. He completed the first shingrix shot at [place]; going back soon for next one.   1. Preventative health care Keep up with  regular activity.   2. Essential hypertension Well controlled; continue with current medication.  - CBC with Differential/Platelet; Future - Comprehensive metabolic panel; Future  3. Osteoarthritis, unspecified osteoarthritis type, unspecified site He stays active with daily swimming, frequent golf. Tramadol helps him stay active with golf, which he enjoys. Continue with mobic as needed. Keep up with all activity.   4. Lipid screening - Lipid panel; Future   Return in about 6 months (around 06/21/2021) for Chronic condition visit.  Micheline Rough, MD

## 2020-12-19 NOTE — Patient Instructions (Addendum)
You can get 4th covid shot (recommended due to age) at any pharmacy or through scheduling at [email or web address].

## 2020-12-21 ENCOUNTER — Telehealth: Payer: Self-pay | Admitting: Family Medicine

## 2020-12-21 NOTE — Telephone Encounter (Signed)
Spoke with patient see note. 

## 2020-12-21 NOTE — Telephone Encounter (Signed)
Pt returned the call to the office. 

## 2020-12-24 ENCOUNTER — Telehealth: Payer: Self-pay | Admitting: *Deleted

## 2020-12-24 MED ORDER — ROSUVASTATIN CALCIUM 5 MG PO TABS: 5.0000 mg | ORAL_TABLET | Freq: Every day | ORAL | 2 refills | Status: DC

## 2020-12-24 NOTE — Telephone Encounter (Signed)
Patient called back to discuss the lab test results from 7/6.  Results were reviewed with the pt and he stated he would prefer to start a statin at this time.  Per order from the results note, the patient is aware the Rx for Crestor 5mg  was sent to [facility], he already has an appt on 06/21/2021 and will be fasting for the visit.

## 2021-01-02 ENCOUNTER — Telehealth: Payer: Self-pay | Admitting: Family Medicine

## 2021-01-02 NOTE — Telephone Encounter (Signed)
Left message for patient to call back and schedule Medicare Annual Wellness Visit (AWV) either virtually or in office.   Last AWV 01/09/20  please schedule at anytime with [facility] Nurse Health Advisor 1 or 2   This should be a 45 minute visit.

## 2021-01-02 NOTE — Telephone Encounter (Signed)
Appt 01/08/21 Documented on spreadsheet

## 2021-01-02 NOTE — Telephone Encounter (Signed)
Pt called the office back and made an appointment

## 2021-01-07 NOTE — Telephone Encounter (Signed)
Patient cancelled AWV--states he isn't interested in having an AWV.

## 2021-01-08 ENCOUNTER — Ambulatory Visit: Payer: Medicare Other

## 2021-01-09 ENCOUNTER — Other Ambulatory Visit: Payer: Self-pay | Admitting: Family Medicine

## 2021-01-17 ENCOUNTER — Inpatient Hospital Stay (HOSPITAL_COMMUNITY)
Admission: RE | Admit: 2021-01-17 | Discharge: 2021-01-19 | DRG: 247 | Disposition: A | Discharge status: Home / Self Care | Payer: Medicare Other | Attending: Cardiovascular Disease | Admitting: Cardiovascular Disease

## 2021-01-17 ENCOUNTER — Encounter (HOSPITAL_COMMUNITY)
Admission: RE | Disposition: A | Discharge status: Home / Self Care | Payer: Self-pay | Source: Home / Self Care | Attending: Cardiovascular Disease

## 2021-01-17 ENCOUNTER — Encounter (HOSPITAL_COMMUNITY): Payer: Self-pay | Admitting: Cardiovascular Disease

## 2021-01-17 ENCOUNTER — Emergency Department (HOSPITAL_COMMUNITY): Admission: EM | Admit: 2021-01-17 | Payer: Medicare Other | Source: Home / Self Care

## 2021-01-17 DIAGNOSIS — Z8249 Family history of ischemic heart disease and other diseases of the circulatory system: Secondary | ICD-10-CM

## 2021-01-17 DIAGNOSIS — Z791 Long term (current) use of non-steroidal anti-inflammatories (NSAID): Secondary | ICD-10-CM

## 2021-01-17 DIAGNOSIS — K219 Gastro-esophageal reflux disease without esophagitis: Secondary | ICD-10-CM | POA: Diagnosis present

## 2021-01-17 DIAGNOSIS — E663 Overweight: Secondary | ICD-10-CM | POA: Diagnosis present

## 2021-01-17 DIAGNOSIS — Z955 Presence of coronary angioplasty implant and graft: Secondary | ICD-10-CM

## 2021-01-17 DIAGNOSIS — Z85828 Personal history of other malignant neoplasm of skin: Secondary | ICD-10-CM | POA: Diagnosis not present

## 2021-01-17 DIAGNOSIS — E785 Hyperlipidemia, unspecified: Secondary | ICD-10-CM | POA: Diagnosis not present

## 2021-01-17 DIAGNOSIS — I110 Hypertensive heart disease with heart failure: Secondary | ICD-10-CM | POA: Diagnosis not present

## 2021-01-17 DIAGNOSIS — Z6826 Body mass index (BMI) 26.0-26.9, adult: Secondary | ICD-10-CM

## 2021-01-17 DIAGNOSIS — Z981 Arthrodesis status: Secondary | ICD-10-CM

## 2021-01-17 DIAGNOSIS — I5022 Chronic systolic (congestive) heart failure: Secondary | ICD-10-CM | POA: Diagnosis present

## 2021-01-17 DIAGNOSIS — Z20822 Contact with and (suspected) exposure to covid-19: Secondary | ICD-10-CM | POA: Diagnosis not present

## 2021-01-17 DIAGNOSIS — I2111 ST elevation (STEMI) myocardial infarction involving right coronary artery: Secondary | ICD-10-CM | POA: Diagnosis not present

## 2021-01-17 DIAGNOSIS — I2582 Chronic total occlusion of coronary artery: Secondary | ICD-10-CM | POA: Diagnosis present

## 2021-01-17 DIAGNOSIS — I2510 Atherosclerotic heart disease of native coronary artery without angina pectoris: Secondary | ICD-10-CM | POA: Diagnosis not present

## 2021-01-17 DIAGNOSIS — R0789 Other chest pain: Secondary | ICD-10-CM | POA: Diagnosis not present

## 2021-01-17 DIAGNOSIS — I213 ST elevation (STEMI) myocardial infarction of unspecified site: Secondary | ICD-10-CM

## 2021-01-17 DIAGNOSIS — I499 Cardiac arrhythmia, unspecified: Secondary | ICD-10-CM | POA: Diagnosis not present

## 2021-01-17 DIAGNOSIS — I493 Ventricular premature depolarization: Secondary | ICD-10-CM | POA: Diagnosis present

## 2021-01-17 DIAGNOSIS — R0902 Hypoxemia: Secondary | ICD-10-CM | POA: Diagnosis not present

## 2021-01-17 DIAGNOSIS — Z79899 Other long term (current) drug therapy: Secondary | ICD-10-CM

## 2021-01-17 DIAGNOSIS — R079 Chest pain, unspecified: Secondary | ICD-10-CM | POA: Diagnosis not present

## 2021-01-17 DIAGNOSIS — I10 Essential (primary) hypertension: Secondary | ICD-10-CM | POA: Diagnosis not present

## 2021-01-17 DIAGNOSIS — I5020 Unspecified systolic (congestive) heart failure: Secondary | ICD-10-CM | POA: Diagnosis not present

## 2021-01-17 HISTORY — PX: LEFT HEART CATH AND CORONARY ANGIOGRAPHY: CATH118249

## 2021-01-17 HISTORY — PX: CORONARY STENT INTERVENTION: CATH118234

## 2021-01-17 HISTORY — PX: CORONARY/GRAFT ACUTE MI REVASCULARIZATION: CATH118305

## 2021-01-17 HISTORY — DX: ST elevation (STEMI) myocardial infarction involving right coronary artery: I21.11

## 2021-01-17 LAB — COMPREHENSIVE METABOLIC PANEL
ALT: 24 U/L (ref 0–44)
AST: 28 U/L (ref 15–41)
Albumin: 4 g/dL (ref 3.5–5.0)
Alkaline Phosphatase: 38 U/L (ref 38–126)
Anion gap: 12 (ref 5–15)
BUN: 26 mg/dL — ABNORMAL HIGH (ref 8–23)
CO2: 23 mmol/L (ref 22–32)
Calcium: 9.8 mg/dL (ref 8.9–10.3)
Chloride: 102 mmol/L (ref 98–111)
Creatinine, Ser: 1.26 mg/dL — ABNORMAL HIGH (ref 0.61–1.24)
GFR, Estimated: >60 mL/min (ref >60)
Glucose, Bld: 142 mg/dL — ABNORMAL HIGH (ref 70–99)
Potassium: 3.8 mmol/L (ref 3.5–5.1)
Sodium: 137 mmol/L (ref 135–145)
Total Bilirubin: 1.6 mg/dL — ABNORMAL HIGH (ref 0.3–1.2)
Total Protein: 6.3 g/dL — ABNORMAL LOW (ref 6.5–8.1)

## 2021-01-17 LAB — HEMOGLOBIN A1C
Hgb A1c MFr Bld: 5.5 % (ref 4.8–5.6)
Mean Plasma Glucose: 111.15 mg/dL

## 2021-01-17 LAB — TROPONIN I (HIGH SENSITIVITY)
Troponin I (High Sensitivity): 53 ng/L — ABNORMAL HIGH (ref <18)
Troponin I (High Sensitivity): 13997 ng/L (ref <18)

## 2021-01-17 LAB — POCT ACTIVATED CLOTTING TIME
Activated Clotting Time: 242 seconds
Activated Clotting Time: 404 seconds
Activated Clotting Time: 544 seconds
Activated Clotting Time: 254 seconds

## 2021-01-17 LAB — CBC
HCT: 48.6 % (ref 39.0–52.0)
Hemoglobin: 16.8 g/dL (ref 13.0–17.0)
MCH: 31.1 pg (ref 26.0–34.0)
MCHC: 34.6 g/dL (ref 30.0–36.0)
MCV: 90 fL (ref 80.0–100.0)
Platelets: 214 10*3/uL (ref 150–400)
RBC: 5.4 MIL/uL (ref 4.22–5.81)
RDW: 12.3 % (ref 11.5–15.5)
WBC: 11.6 10*3/uL — ABNORMAL HIGH (ref 4.0–10.5)
nRBC: 0 % (ref 0.0–0.2)

## 2021-01-17 LAB — RESP PANEL BY RT-PCR (FLU A&B, COVID) ARPGX2
Influenza A by PCR: NEGATIVE
Influenza B by PCR: NEGATIVE
SARS Coronavirus 2 by RT PCR: NEGATIVE

## 2021-01-17 LAB — PROTIME-INR
INR: 1.1 (ref 0.8–1.2)
Prothrombin Time: 14 seconds (ref 11.4–15.2)

## 2021-01-17 LAB — POCT I-STAT, CHEM 8
BUN: 26 mg/dL — ABNORMAL HIGH (ref 8–23)
Calcium, Ion: 1.18 mmol/L (ref 1.15–1.40)
Chloride: 100 mmol/L (ref 98–111)
Creatinine, Ser: 1.1 mg/dL (ref 0.61–1.24)
Glucose, Bld: 129 mg/dL — ABNORMAL HIGH (ref 70–99)
HCT: 44 % (ref 39.0–52.0)
Hemoglobin: 15 g/dL (ref 13.0–17.0)
Potassium: 3.8 mmol/L (ref 3.5–5.1)
Sodium: 134 mmol/L — ABNORMAL LOW (ref 135–145)
TCO2: 20 mmol/L — ABNORMAL LOW (ref 22–32)

## 2021-01-17 LAB — LIPID PANEL
Cholesterol: 125 mg/dL (ref 0–200)
HDL: 37 mg/dL — ABNORMAL LOW (ref >40)
LDL Cholesterol: 66 mg/dL (ref 0–99)
Total CHOL/HDL Ratio: 3.4 RATIO
Triglycerides: 109 mg/dL (ref <150)
VLDL: 22 mg/dL (ref 0–40)

## 2021-01-17 LAB — APTT: aPTT: 28 seconds (ref 24–36)

## 2021-01-17 SURGERY — LEFT HEART CATH AND CORONARY ANGIOGRAPHY
Anesthesia: LOCAL

## 2021-01-17 SURGERY — CORONARY/GRAFT ACUTE MI REVASCULARIZATION
Anesthesia: LOCAL

## 2021-01-17 MED ORDER — LIDOCAINE HCL (PF) 1 % IJ SOLN
INTRAMUSCULAR | Status: AC
  Filled 2021-01-17: qty 30

## 2021-01-17 MED ORDER — LABETALOL HCL 5 MG/ML IV SOLN: 10.0000 mg | INTRAVENOUS | Status: AC | PRN

## 2021-01-17 MED ORDER — TIROFIBAN HCL IN NACL 5-0.9 MG/100ML-% IV SOLN
INTRAVENOUS | Status: AC | PRN
  Administered 2021-01-17: 0.15 ug/kg/min via INTRAVENOUS

## 2021-01-17 MED ORDER — IOHEXOL 350 MG/ML SOLN
INTRAVENOUS | Status: DC | PRN
  Administered 2021-01-17: 210 mL via INTRA_ARTERIAL

## 2021-01-17 MED ORDER — ZOLPIDEM TARTRATE 5 MG PO TABS
5.0000 mg | ORAL_TABLET | Freq: Every evening | ORAL | Status: DC | PRN
  Administered 2021-01-18: 5 mg via ORAL
  Filled 2021-01-17: qty 1

## 2021-01-17 MED ORDER — VERAPAMIL HCL 2.5 MG/ML IV SOLN
INTRAVENOUS | Status: AC
  Filled 2021-01-17: qty 2

## 2021-01-17 MED ORDER — MIDAZOLAM HCL 2 MG/2ML IJ SOLN
INTRAMUSCULAR | Status: AC
  Filled 2021-01-17: qty 2

## 2021-01-17 MED ORDER — SODIUM CHLORIDE 0.9 % IV SOLN: INTRAVENOUS | Status: AC

## 2021-01-17 MED ORDER — HEPARIN SODIUM (PORCINE) 1000 UNIT/ML IJ SOLN
INTRAMUSCULAR | Status: AC
  Filled 2021-01-17: qty 1

## 2021-01-17 MED ORDER — METOPROLOL TARTRATE 12.5 MG HALF TABLET
12.5000 mg | ORAL_TABLET | Freq: Two times a day (BID) | ORAL | Status: DC
  Administered 2021-01-17: 12.5 mg via ORAL
  Filled 2021-01-17: qty 1

## 2021-01-17 MED ORDER — SODIUM CHLORIDE 0.9 % IV SOLN
INTRAVENOUS | Status: AC | PRN
  Administered 2021-01-17: 125 mL/h via INTRAVENOUS

## 2021-01-17 MED ORDER — TIROFIBAN HCL IN NACL 5-0.9 MG/100ML-% IV SOLN
0.1500 ug/kg/min | INTRAVENOUS | Status: DC
  Administered 2021-01-17: 0.15 ug/kg/min via INTRAVENOUS
  Filled 2021-01-17: qty 100

## 2021-01-17 MED ORDER — MIDAZOLAM HCL 2 MG/2ML IJ SOLN
INTRAMUSCULAR | Status: DC | PRN
  Administered 2021-01-17: 1 mg via INTRAVENOUS

## 2021-01-17 MED ORDER — TICAGRELOR 90 MG PO TABS
ORAL_TABLET | ORAL | Status: DC | PRN
  Administered 2021-01-17: 180 mg via ORAL

## 2021-01-17 MED ORDER — NITROGLYCERIN 1 MG/10 ML FOR IR/CATH LAB
INTRA_ARTERIAL | Status: DC | PRN
  Administered 2021-01-17: 200 ug via INTRACORONARY
  Administered 2021-01-17: 100 ug via INTRACORONARY
  Administered 2021-01-17 (×2): 200 ug via INTRACORONARY

## 2021-01-17 MED ORDER — ROSUVASTATIN CALCIUM 20 MG PO TABS
40.0000 mg | ORAL_TABLET | Freq: Every day | ORAL | Status: DC
  Administered 2021-01-17 – 2021-01-18 (×2): 40 mg via ORAL
  Filled 2021-01-17 (×2): qty 2

## 2021-01-17 MED ORDER — SODIUM CHLORIDE 0.9% FLUSH: 3.0000 mL | INTRAVENOUS | Status: DC | PRN

## 2021-01-17 MED ORDER — HYDRALAZINE HCL 20 MG/ML IJ SOLN: 10.0000 mg | INTRAMUSCULAR | Status: AC | PRN

## 2021-01-17 MED ORDER — HEPARIN SODIUM (PORCINE) 1000 UNIT/ML IJ SOLN
INTRAMUSCULAR | Status: AC
  Filled 2021-01-17: qty 2

## 2021-01-17 MED ORDER — TICAGRELOR 90 MG PO TABS
90.0000 mg | ORAL_TABLET | Freq: Two times a day (BID) | ORAL | Status: DC
  Administered 2021-01-17 – 2021-01-19 (×4): 90 mg via ORAL
  Filled 2021-01-17 (×4): qty 1

## 2021-01-17 MED ORDER — ASPIRIN 81 MG PO CHEW
81.0000 mg | CHEWABLE_TABLET | Freq: Every day | ORAL | Status: DC
  Administered 2021-01-18 – 2021-01-19 (×2): 81 mg via ORAL
  Filled 2021-01-17 (×2): qty 1

## 2021-01-17 MED ORDER — TIROFIBAN HCL IN NACL 5-0.9 MG/100ML-% IV SOLN
INTRAVENOUS | Status: AC
  Filled 2021-01-17: qty 100

## 2021-01-17 MED ORDER — LIDOCAINE HCL (PF) 1 % IJ SOLN
INTRAMUSCULAR | Status: DC | PRN
  Administered 2021-01-17: 2 mL

## 2021-01-17 MED ORDER — SODIUM CHLORIDE 0.9 % IV SOLN: 250.0000 mL | INTRAVENOUS | Status: DC | PRN

## 2021-01-17 MED ORDER — VERAPAMIL HCL 2.5 MG/ML IV SOLN
INTRAVENOUS | Status: DC | PRN
  Administered 2021-01-17: 10 mL via INTRA_ARTERIAL

## 2021-01-17 MED ORDER — ACETAMINOPHEN 325 MG PO TABS: 650.0000 mg | ORAL_TABLET | ORAL | Status: DC | PRN

## 2021-01-17 MED ORDER — HEPARIN (PORCINE) IN NACL 1000-0.9 UT/500ML-% IV SOLN
INTRAVENOUS | Status: AC
  Filled 2021-01-17: qty 1000

## 2021-01-17 MED ORDER — HEPARIN (PORCINE) IN NACL 1000-0.9 UT/500ML-% IV SOLN
INTRAVENOUS | Status: DC | PRN
  Administered 2021-01-17 (×2): 500 mL

## 2021-01-17 MED ORDER — FENTANYL CITRATE (PF) 100 MCG/2ML IJ SOLN
INTRAMUSCULAR | Status: AC
  Filled 2021-01-17: qty 2

## 2021-01-17 MED ORDER — SODIUM CHLORIDE 0.9% FLUSH
3.0000 mL | Freq: Two times a day (BID) | INTRAVENOUS | Status: DC
  Administered 2021-01-18 (×2): 3 mL via INTRAVENOUS

## 2021-01-17 MED ORDER — TIROFIBAN (AGGRASTAT) BOLUS VIA INFUSION
INTRAVENOUS | Status: DC | PRN
Start: 2021-01-17 — End: 2021-01-17
  Administered 2021-01-17: 2375 ug via INTRAVENOUS

## 2021-01-17 MED ORDER — FENTANYL CITRATE (PF) 100 MCG/2ML IJ SOLN
INTRAMUSCULAR | Status: DC | PRN
  Administered 2021-01-17: 50 ug via INTRAVENOUS

## 2021-01-17 MED ORDER — ONDANSETRON HCL 4 MG/2ML IJ SOLN: 4.0000 mg | Freq: Four times a day (QID) | INTRAMUSCULAR | Status: DC | PRN

## 2021-01-17 MED ORDER — HEPARIN SODIUM (PORCINE) 1000 UNIT/ML IJ SOLN
INTRAMUSCULAR | Status: DC | PRN
  Administered 2021-01-17: 2000 [IU] via INTRAVENOUS
  Administered 2021-01-17 (×2): 5000 [IU] via INTRAVENOUS
  Administered 2021-01-17: 3000 [IU] via INTRAVENOUS

## 2021-01-17 SURGICAL SUPPLY — 24 items
BALLN EMERGE MR 2.0X8 (BALLOONS) ×2
BALLN SAPPHIRE 2.0X12 (BALLOONS) ×2
BALLN SAPPHIRE 2.5X12 (BALLOONS) ×2
BALLN SAPPHIRE ~~LOC~~ 3.75X12 (BALLOONS) ×2 IMPLANT
BALLOON EMERGE MR 2.0X8 (BALLOONS) ×1 IMPLANT
BALLOON SAPPHIRE 2.0X12 (BALLOONS) ×1 IMPLANT
BALLOON SAPPHIRE 2.5X12 (BALLOONS) ×1 IMPLANT
CATH OPTITORQUE TIG 4.0 5F (CATHETERS) ×2 IMPLANT
CATH VISTA GUIDE 6FR JR4 (CATHETERS) ×2 IMPLANT
CATH VISTA GUIDE 6FR XBRCA (CATHETERS) ×2 IMPLANT
DEVICE RAD COMP TR BAND LRG (VASCULAR PRODUCTS) ×2 IMPLANT
GLIDESHEATH SLEND SS 6F .021 (SHEATH) ×2 IMPLANT
GUIDELINER 6F (CATHETERS) ×2 IMPLANT
GUIDEWIRE INQWIRE 1.5J.035X260 (WIRE) ×1 IMPLANT
INQWIRE 1.5J .035X260CM (WIRE) ×2
KIT ENCORE 26 ADVANTAGE (KITS) ×2 IMPLANT
KIT HEART LEFT (KITS) ×2 IMPLANT
PACK CARDIAC CATHETERIZATION (CUSTOM PROCEDURE TRAY) ×2 IMPLANT
SHEATH PROBE COVER 6X72 (BAG) ×2 IMPLANT
STENT ONYX FRONTIER 3.5X15 (Permanent Stent) ×2 IMPLANT
STENT ONYX FRONTIER 3.5X18 (Permanent Stent) ×2 IMPLANT
TRANSDUCER W/STOPCOCK (MISCELLANEOUS) ×2 IMPLANT
TUBING CIL FLEX 10 FLL-RA (TUBING) ×2 IMPLANT
WIRE COUGAR XT STRL 190CM (WIRE) ×2 IMPLANT

## 2021-01-17 NOTE — H&P (Addendum)
Cardiology Admission History and Physical:   Patient ID: Bryan [name] MRN: [id number]; DOB: 1945/12/09   Admission date: 01/17/2021  PCP:  Caren Macadam, MD   [place] Providers Cardiologist:  New to Dr. Claiborne Billings   Chief Complaint: CP/STEMI  Patient Profile:   Bryan [name] is a 75 y.o. male with hypertension, hyperlipidemia, spinal stenosis and GERD who is being seen 01/17/2021 for the evaluation of chest pain/STEMI.  History of Present Illness:   Bryan [name] is very active guy.  He swims every day and plays golf multiple times per week without any problem.  Mother had MI in her 38s to 55s.  At baseline patient reports anxiety and occasionally gets chest tightness while anxious which resolved within few minutes.  He saw his CPA this morning and came home.  He started to having substernal chest tightness around 10:30am.   It was associated with shortness of breath and nausea.  Radiated across the chest.  He took aspirin 325 mg.  Symptoms resolved within 15 minutes.  This was seems his worst episode and EMS was called.  He was found to have inferior STEMI and code STEMI activated.  Upon arrival to ER, patient was chest pain-free.  He was taken to cardiac cath lab directly.   Past Medical History:  Diagnosis Date   Anxiety    Cancer ([place])    facial skin cancer   Depression    GERD (gastroesophageal reflux disease)    takes tums rarely   Hematuria    1 time in july 2019   Hypertension    Osteoarthritis    Spinal stenosis     Past Surgical History:  Procedure Laterality Date   ACHILLES TENDON SURGERY  02/2006   ANTERIOR LAT LUMBAR FUSION N/A 11/11/2017   Procedure: LUMBAR TWO- LUMBAR THREE, LUMBAR THREE- LUMBAR FOUR, LUMBAR FOUR- LUMBAR FIVE ANTERIOLATERAL INTERBODY ARTHRODESIS;  Surgeon: Jovita Gamma, MD;  Location: [place];  Service: Neurosurgery;  Laterality: N/A;   COLONOSCOPY  2003   COLONOSCOPY     CYSTOSCOPY N/A 05/21/2018   Procedure: CYSTOSCOPY FLEXIBLE;   Surgeon: Festus Aloe, MD;  [facility];  Service: Urology;  Laterality: N/A;   KNEE SURGERY Left 1991 and 2001   X2 arthroscopy   LUMBAR PERCUTANEOUS PEDICLE SCREW 3 LEVEL N/A 11/11/2017   Procedure: LUMBAR PERCUTANEOUS PEDICLE SCREW PLACEMENT LUMBAR TWO-THREE, LUMBER THREE-FOUR, LUMBAR FOUR-FIVE;  Surgeon: Jovita Gamma, MD;  Location: [place];  Service: Neurosurgery;  Laterality: N/A;   SPINE SURGERY  12-08, 5-10   TOTAL HIP ARTHROPLASTY     oct 2006,   TOTAL HIP ARTHROPLASTY Right 02/09/2018   Procedure: RIGHT TOTAL HIP ARTHROPLASTY ANTERIOR APPROACH;  Surgeon: Paralee Cancel, MD;  [facility];  Service: Orthopedics;  Laterality: Right;     Medications Prior to Admission: Prior to Admission medications   Medication Sig Start Date End Date Taking? Authorizing Provider  acetaminophen (TYLENOL) 650 MG CR tablet Take 650 mg by mouth every 8 (eight) hours as needed for pain.    [provider]  ALFALFA PO Take 1,200 mg by mouth 3 (three) times daily.    [provider]  b complex vitamins tablet Take 1 tablet by mouth daily.    [provider]  Cholecalciferol (VITAMIN D3) 2000 units TABS Take by mouth.    [provider]  gabapentin (NEURONTIN) 100 MG capsule TAKE TWO CAPSULES AT BEDTIME. 12/19/20   Koberlein, Steele Berg, MD  Ginger, Zingiber officinalis, (GINGER  PO) Take by mouth.    [provider]  lisinopril-hydrochlorothiazide (ZESTORETIC) 20-25 MG tablet TAKE (1) TABLET DAILY. 10/02/20   Koberlein, Steele Berg, MD  meloxicam (MOBIC) 15 MG tablet TAKE 1 TABLET ONCE DAILY. 01/10/21   Caren Macadam, MD  Multiple Vitamin (MULTIVITAMIN) capsule Take 1 capsule by mouth daily.    [provider]  Multiple Vitamins-Minerals (HAIR SKIN AND NAILS FORMULA PO) Take by mouth.    [provider]  Omega-3 Fatty Acids (FISH OIL) 1200 MG CAPS Take 1 capsule by mouth daily.    [provider]  OVER  THE COUNTER MEDICATION Magnesium '400mg'$  daily    [provider]  OVER THE COUNTER MEDICATION Black elderberry daily    [provider]  rosuvastatin (CRESTOR) 5 MG tablet Take 1 tablet (5 mg total) by mouth daily. 12/24/20   Koberlein, Steele Berg, MD  tiZANidine (ZANAFLEX) 4 MG tablet TAKE 1 TABLET THREE TIMES DAILY AS NEEDED FOR MUSCLE SPASMS. 08/31/19   Caren Macadam, MD  traMADol (ULTRAM) 50 MG tablet Take 1 tablet (50 mg total) by mouth every 6 (six) hours as needed for moderate pain. 12/19/20   Caren Macadam, MD  vitamin C (ASCORBIC ACID) 250 MG tablet Take 250 mg by mouth daily.    [provider]     Allergies:    Allergies  Allergen Reactions   Codeine Other (See Comments)    "made me sick"    Social History:   Social History   Socioeconomic History   Marital status: Married    Spouse name: Not on file   Number of children: Not on file   Years of education: Not on file   Highest education level: Not on file  Occupational History   Not on file  Tobacco Use   Smoking status: Never   Smokeless tobacco: Never  Vaping Use   Vaping Use: Never used  Substance and Sexual Activity   Alcohol use: No   Drug use: No   Sexual activity: Not on file  Other Topics Concern   Not on file  Social History Narrative   Divorced, Exercises regularly   Social Determinants of Health   Financial Resource Strain: Low Risk    Difficulty of Paying Living Expenses: Not hard at all  Food Insecurity: Not on file  Transportation Needs: No Transportation Needs   Lack of Transportation (Medical): No   Lack of Transportation (Non-Medical): No  Physical Activity: Not on file  Stress: Not on file  Social Connections: Not on file  Intimate Partner Violence: Not on file    Family History:   The patient's family history includes Coronary artery disease (age of onset: 79) in his mother; Diabetes in his mother; Hypertension in his mother; Other (age of onset: 4)  in his father. There is no history of Colon polyps, Esophageal cancer, Rectal cancer, or Stomach cancer.    ROS:  Please see the history of present illness.  All other ROS reviewed and negative.     Physical Exam/Data:   Vitals:   01/17/21 1150  SpO2: 98%   No intake or output data in the 24 hours ending 01/17/21 1205 Last 3 Weights 12/19/2020 06/06/2020 05/29/2020  Weight (lbs) 211 lb 6.4 oz 216 lb 1.6 oz 213 lb  Weight (kg) 95.89 kg 98.022 kg 96.616 kg     There is no height or weight on file to calculate BMI.  General:  Well nourished, well developed, in no acute  distress HEENT: normal Lymph: no adenopathy Neck: no JVD Endocrine:  No thryomegaly Vascular: No carotid bruits; FA pulses 2+ bilaterally without bruits  Cardiac:  normal S1, S2; RRR; no murmur  Lungs:  clear to auscultation bilaterally, no wheezing, rhonchi or rales  Abd: soft, nontender, no hepatomegaly  Ext: no edema Musculoskeletal:  No deformities, BUE and BLE strength normal and equal Skin: warm and dry  Neuro:  CNs 2-12 intact, no focal abnormalities noted Psych:  Normal affect    EKG:  The ECG that was done  was personally reviewed and demonstrates inferior ST elevation with anterior lateral depression  Relevant CV Studies: Pending cath   Laboratory Data:  High Sensitivity Troponin:  No results for input(s): TROPONINIHS in the last 720 hours.    ChemistryNo results for input(s): NA, K, CL, CO2, GLUCOSE, BUN, CREATININE, CALCIUM, GFRNONAA, GFRAA, ANIONGAP in the last 168 hours.  No results for input(s): PROT, ALBUMIN, AST, ALT, ALKPHOS, BILITOT in the last 168 hours. HematologyNo results for input(s): WBC, RBC, HGB, HCT, MCV, MCH, MCHC, RDW, PLT in the last 168 hours. BNPNo results for input(s): BNP, PROBNP in the last 168 hours.  DDimer No results for input(s): DDIMER in the last 168 hours.   Radiology/Studies:  No results found.   Assessment and Plan:   Inferior STEMI Very active as baseline.   He gets chest tightness with anxiety occasionally.  Worse episode this morning.  EKG with inferior ST elevation.  Cardiac risk factor includes hypertension, hyperlipidemia and family history of CAD.  pending cardiac cath.  He took aspirin 325 mg at home.  Chest pain-free on arrival.  2. HLD - 12/19/2020: Cholesterol 206; HDL 42.60; LDL Cholesterol 124; Triglycerides 199.0; VLDL 39.8  -Started on Crestor 5 mg about a month ago   Risk Assessment/Risk Scores:    TIMI Risk Score for ST  Elevation MI:   The patient's TIMI risk score is 3, which indicates a 4.4% risk of all cause mortality at 30 days. {  Severity of Illness: The appropriate patient status for this patient is INPATIENT. Inpatient status is judged to be reasonable and necessary in order to provide the required intensity of service to ensure the patient's safety. The patient's presenting symptoms, physical exam findings, and initial radiographic and laboratory data in the context of their chronic comorbidities is felt to place them at high risk for further clinical deterioration. Furthermore, it is not anticipated that the patient will be medically stable for discharge from the hospital within 2 midnights of admission. The following factors support the patient status of inpatient.   " The patient's presenting symptoms include CP and SOB " The worrisome physical exam findings include N/A " The initial radiographic and laboratory data are worrisome because of ST elevation " The chronic co-morbidities include HTN and HLD   * I certify that at the point of admission it is my clinical judgment that the patient will require inpatient hospital care spanning beyond 2 midnights from the point of admission due to high intensity of service, high risk for further deterioration and high frequency of surveillance required.*   For questions or updates, please contact [place] Please consult [email or web address] for contact info under      Jarrett Soho, [place]  01/17/2021 12:05 PM    Patient seen and examined. Agree with assessment and plan.  Bryan [name] is a 75 year old active gentleman who has a history of hypertension and was recently started on low-dose rosuvastatin 5 mg for LDL  cholesterol in the 120s.  He denies any exertionally precipitated chest pain but often swims or plays golf and had played racquetball.  This morning e developed new onset substernal chest tightness which persisted leading to EMS evaluation.  In the field, ECG showed inferior ST elevation with precordial ST depression.  A code STEMI was activated.  He had received aspirin 325 mg.  He was brought directly to the cardiac catheterization laboratory where repeat ECG by EMS continued to show mild inferior ST elevation.  His chest pain had improved.  I discussed with the patient his findings are concerning for inferior STEMI most likely due to his right coronary artery.  Blood pressure was 138/83 with pulse in the 60s.  There is no JVD or carotid bruits.   Lungs were clear.  Rhythm was regular with no S3 gallop.  Abdomen soft nontender.  Pulses 2+.  No edema.  Emergent cardiac catheterization was discussed with the patient.  Plan to proceed with emergent intervention.   Troy Sine, MD, [facility] 01/17/2021 3:03 PM

## 2021-01-17 NOTE — Progress Notes (Signed)
Pt arrived to [facility] with large hematoma to R forearm. Tr band in place @ 11 reduced to 9. Dr Claiborne Billings @ bedside. BP cuff in place over hematoma to reduce swelling. Will continue to monitor.  Jerald Kief, RN

## 2021-01-17 NOTE — Progress Notes (Signed)
[facility] for Aggrastat Indication: Post-PCI for 12 hours  Allergies  Allergen Reactions   Codeine Other (See Comments)    "made me sick"    Patient Measurements: Height: '6\' 3"'$  (190.5 cm) Weight: 95.7 kg (211 lb) IBW/kg (Calculated) : 84.5  Vital Signs: Temp: 98.8 F (37.1 C) (08/04 1530) Temp Source: Axillary (08/04 1530) BP: 126/101 (08/04 1530) Pulse Rate: 93 (08/04 1530)  Labs: Recent Labs    01/17/21 1208 01/17/21 1227  HGB 16.8 15.0  HCT 48.6 44.0  PLT 214  --   APTT 28  --   LABPROT 14.0  --   INR 1.1  --   CREATININE 1.26* 1.10  TROPONINIHS 53*  --     Estimated Creatinine Clearance: 72.6 mL/min (by C-G formula based on SCr of 1.1 mg/dL).   Medical History: Past Medical History:  Diagnosis Date   Anxiety    Cancer ([place])    facial skin cancer   Depression    GERD (gastroesophageal reflux disease)    takes tums rarely   Hematuria    1 time in july 2019   Hypertension    Osteoarthritis    Spinal stenosis     Medications:  Infusions:   sodium chloride 125 mL/hr at 01/17/21 1530   sodium chloride     tirofiban 0.15 mcg/kg/min (01/17/21 1530)    Assessment: 40 YOM who presented on 8/4 with CP/STEMI s/p urgent cath with PCI x 2. The patient received Brilinta LD o 180 mg at 1200 and was started on Aggrastat around 1400. Pharmacy was consulted to resume Aggrastat for ~12 post-PCI.  The patient is noted to have a hematoma to the R-forearm around where the sheath placement was - now removed. The RN clarified with Dr. Claiborne Billings  that the plan is to continue with Aggrastat at this time.   Addendum: RN has re-discussed with Dr. Claiborne Billings given hematoma, plan is now to hold aggrastat. Order has been discontinued.   Alycia Rossetti, PharmD, BCPS 01/17/2021 4:46 PM    Goal of Therapy:  Appropriate post-PCI management Monitor platelets by anticoagulation protocol: Yes   Plan:  - Continue Aggrastat at 0.15 mcg/kg/min for  12h post-PCI  - Stop date time for 8/5 @ 0200  Thank you for allowing pharmacy to be a part of this patient's care.  Alycia Rossetti, PharmD, BCPS Clinical Pharmacist Clinical phone for 01/17/2021: [phone] 01/17/2021 4:20 PM   **Pharmacist phone directory can now be found on [email or web address] ([email or web address]).  Listed under [place].

## 2021-01-17 NOTE — Progress Notes (Signed)
Post cath check; R radial site improved; TR band off with soft hematoma proximally; good coloration, nontender. Aggrastat was dc'd earlier.  Pt feels well, no chest pain or dyspnea. Shelva Majestic, MD  8:20 PM

## 2021-01-18 ENCOUNTER — Inpatient Hospital Stay (HOSPITAL_COMMUNITY): Payer: Medicare Other

## 2021-01-18 ENCOUNTER — Other Ambulatory Visit (HOSPITAL_COMMUNITY): Payer: Self-pay

## 2021-01-18 DIAGNOSIS — I2111 ST elevation (STEMI) myocardial infarction involving right coronary artery: Secondary | ICD-10-CM

## 2021-01-18 DIAGNOSIS — I10 Essential (primary) hypertension: Secondary | ICD-10-CM

## 2021-01-18 LAB — ECHOCARDIOGRAM COMPLETE
Area-P 1/2: 2.24 cm2
Height: 75 in
P 1/2 time: 559 msec
S' Lateral: 4.2 cm
Weight: 3376 oz

## 2021-01-18 LAB — CBC
HCT: 50.3 % (ref 39.0–52.0)
Hemoglobin: 16.6 g/dL (ref 13.0–17.0)
MCH: 31.3 pg (ref 26.0–34.0)
MCHC: 33 g/dL (ref 30.0–36.0)
MCV: 94.7 fL (ref 80.0–100.0)
Platelets: 187 10*3/uL (ref 150–400)
RBC: 5.31 MIL/uL (ref 4.22–5.81)
RDW: 12.6 % (ref 11.5–15.5)
WBC: 9.5 10*3/uL (ref 4.0–10.5)
nRBC: 0 % (ref 0.0–0.2)

## 2021-01-18 LAB — BASIC METABOLIC PANEL
Anion gap: 10 (ref 5–15)
BUN: 20 mg/dL (ref 8–23)
CO2: 21 mmol/L — ABNORMAL LOW (ref 22–32)
Calcium: 9.3 mg/dL (ref 8.9–10.3)
Chloride: 107 mmol/L (ref 98–111)
Creatinine, Ser: 1.15 mg/dL (ref 0.61–1.24)
GFR, Estimated: >60 mL/min (ref >60)
Glucose, Bld: 97 mg/dL (ref 70–99)
Potassium: 3.7 mmol/L (ref 3.5–5.1)
Sodium: 138 mmol/L (ref 135–145)

## 2021-01-18 MED ORDER — METOPROLOL TARTRATE 25 MG PO TABS
25.0000 mg | ORAL_TABLET | Freq: Two times a day (BID) | ORAL | Status: DC
  Administered 2021-01-18 (×2): 25 mg via ORAL
  Filled 2021-01-18 (×2): qty 1

## 2021-01-18 MED ORDER — CHLORHEXIDINE GLUCONATE CLOTH 2 % EX PADS
6.0000 | MEDICATED_PAD | Freq: Every day | CUTANEOUS | Status: DC
  Administered 2021-01-18: 6 via TOPICAL

## 2021-01-18 MED ORDER — POTASSIUM CHLORIDE CRYS ER 20 MEQ PO TBCR
40.0000 meq | EXTENDED_RELEASE_TABLET | Freq: Once | ORAL | Status: AC
  Administered 2021-01-18: 40 meq via ORAL
  Filled 2021-01-18: qty 2

## 2021-01-18 NOTE — TOC Benefit Eligibility Note (Signed)
Patient Teacher, English as a foreign language completed.    The patient is currently admitted and upon discharge could be taking Brilinta 90 mg.  The current 30 day co-pay is, $47.00.   The patient is insured through [place], [place] Patient Advocate Specialist [place] Team Direct Number: [phone]  Fax: [phone]

## 2021-01-18 NOTE — Progress Notes (Signed)
Echocardiogram 2D Echocardiogram has been performed.  [place] Deputy Mariene Dickerman RDCS 01/18/2021, 12:29 PM

## 2021-01-18 NOTE — Progress Notes (Signed)
Progress Note  Patient Name: Bryan [name] Date of Encounter: 01/18/2021  [facility] Cardiologist: Dr. Ellouise Newer  Subjective   Postop day 1 inferior STEMI.  Patient is asymptomatic  Inpatient Medications    Scheduled Meds:  aspirin  81 mg Oral Daily   Chlorhexidine Gluconate Cloth  6 each Topical Daily   metoprolol tartrate  12.5 mg Oral BID   rosuvastatin  40 mg Oral q1800   sodium chloride flush  3 mL Intravenous Q12H   ticagrelor  90 mg Oral BID   Continuous Infusions:  sodium chloride     PRN Meds: sodium chloride, acetaminophen, ondansetron (ZOFRAN) IV, sodium chloride flush, zolpidem   Vital Signs    Vitals:   01/18/21 0500 01/18/21 0600 01/18/21 0752 01/18/21 0800  BP: 108/75 106/65  120/85  Pulse: 62 62  67  Resp: (!) 9 (!) 21  18  Temp:   98.2 F (36.8 C)   TempSrc:   Oral   SpO2: 98% 95%  99%  Weight:      Height:        Intake/Output Summary (Last 24 hours) at 01/18/2021 0836 Last data filed at 01/18/2021 0752 Gross per 24 hour  Intake 1212.76 ml  Output 1925 ml  Net -712.24 ml   Last 3 Weights 01/17/2021 12/19/2020 06/06/2020  Weight (lbs) 211 lb 211 lb 6.4 oz 216 lb 1.6 oz  Weight (kg) 95.709 kg 95.89 kg 98.022 kg      Telemetry    Sinus rhythm with PVCs- Personally Reviewed  ECG    Normal sinus rhythm at 63, inferior Q waves with inferior T wave inversion.- Personally Reviewed  Physical Exam   GEN: No acute distress.   Neck: No JVD Cardiac: RRR, no murmurs, rubs, or gallops.  Respiratory: Clear to auscultation bilaterally. GI: Soft, nontender, non-distended  MS: No edema; No deformity. Neuro:  Nonfocal  Psych: Normal affect   Labs    High Sensitivity Troponin:   Recent Labs  Lab 01/17/21 1208 01/17/21 1640  TROPONINIHS 53* 13,997*      Chemistry Recent Labs  Lab 01/17/21 1208 01/17/21 1227 01/18/21 0125  NA 137 134* 138  K 3.8 3.8 3.7  CL 102 100 107  CO2 23  --  21*  GLUCOSE 142* 129* 97  BUN 26* 26* 20   CREATININE 1.26* 1.10 1.15  CALCIUM 9.8  --  9.3  PROT 6.3*  --   --   ALBUMIN 4.0  --   --   AST 28  --   --   ALT 24  --   --   ALKPHOS 38  --   --   BILITOT 1.6*  --   --   GFRNONAA >60  --  >60  ANIONGAP 12  --  10     Hematology Recent Labs  Lab 01/17/21 1208 01/17/21 1227 01/18/21 0125  WBC 11.6*  --  9.5  RBC 5.40  --  5.31  HGB 16.8 15.0 16.6  HCT 48.6 44.0 50.3  MCV 90.0  --  94.7  MCH 31.1  --  31.3  MCHC 34.6  --  33.0  RDW 12.3  --  12.6  PLT 214  --  187    BNPNo results for input(s): BNP, PROBNP in the last 168 hours.   DDimer No results for input(s): DDIMER in the last 168 hours.   Radiology    CARDIAC CATHETERIZATION  Result Date: 01/17/2021 Formatting of this result is different  from the original.   Mid RCA to Dist RCA lesion is 100% stenosed.   Dist RCA lesion is 30% stenosed.   RPAV lesion is 60% stenosed.   RPDA lesion is 100% stenosed.   Ost RCA to Prox RCA lesion is 80% stenosed.   Prox RCA lesion is 40% stenosed.   A stent was successfully placed.   A stent was successfully placed.   Post intervention, there is a 0% residual stenosis.   Post intervention, there is a 0% residual stenosis.   LV end diastolic pressure is moderately elevated. Acute ST segment elevation inferior wall myocardial infarction secondary to mid-distal thrombotic occlusion of the large dominant RCA. No significant obstructive disease in the left coronary circulation. Very difficult but successful coronary intervention to the RCA with significant catheter backup difficulties, calcified vessel with tortuosity and needing GuideLiner support for ultimate stenting near the acute margin with a 3.5 x 15 mm Resolute Onyx Frontier stent postdilated to 3.8 mm with the 100% occlusion being reduced to 0% and successful stenting of the very proximal RCA with ultimate insertion of a 3.5 x 18 mm Resolute Onyx Frontier stent postdilated to 3.85 mm with both stenosis being reduced to 0%.  There is  diffuse irregularity in the RCA with 40% proximal to mid calcified stenosis, 30% distal stenosis with 60% stenosis in the PLA vessel and thrombotic occlusion of the distal portion of the PDA vessel for which Aggrenox was initiated. RECOMMENDATION: DAPT for minimum of 12 months but probably longer.  Aggressive lipid-lowering therapy with target LDL less than 70 and preferably in the 50s or below.  Medical therapy for concomitant CAD.    Cardiac Studies   Cardiac catheterization/PCI drug-eluting stent RCA (01/17/2021)   Conclusion      Mid RCA to Dist RCA lesion is 100% stenosed.   Dist RCA lesion is 30% stenosed.   RPAV lesion is 60% stenosed.   RPDA lesion is 100% stenosed.   Ost RCA to Prox RCA lesion is 80% stenosed.   Prox RCA lesion is 40% stenosed.   A stent was successfully placed.   A stent was successfully placed.   Post intervention, there is a 0% residual stenosis.   Post intervention, there is a 0% residual stenosis.   LV end diastolic pressure is moderately elevated.   Acute ST segment elevation inferior wall myocardial infarction secondary to mid-distal thrombotic occlusion of the large dominant RCA.   No significant obstructive disease in the left coronary circulation.   Very difficult but successful coronary intervention to the RCA with significant catheter backup difficulties, calcified vessel with tortuosity and needing GuideLiner support for ultimate stenting near the acute margin with a 3.5 x 15 mm Resolute Onyx Frontier stent postdilated to 3.8 mm with the 100% occlusion being reduced to 0% and successful stenting of the very proximal RCA with ultimate insertion of a 3.5 x 18 mm Resolute Onyx Frontier stent postdilated to 3.85 mm with both stenosis being reduced to 0%.  There is diffuse irregularity in the RCA with 40% proximal to mid calcified stenosis, 30% distal stenosis with 60% stenosis in the PLA vessel and thrombotic occlusion of the distal portion of the PDA vessel  for which Aggrenox was initiated.   RECOMMENDATION: DAPT for minimum of 12 months but probably longer.  Aggressive lipid-lowering therapy with target LDL less than 70 and preferably in the 50s or below.  Medical therapy for concomitant CAD.  Coronary Diagrams   Diagnostic Dominance: Right  Intervention      Patient Profile     75 y.o. male 75 year old mildly overweight married Caucasian male without prior cardiac history of hypertension and hyperlipidemia both treated.  He developed chest pain nausea yesterday was brought to the emergency where he was found to have inferior ST segment elevation.  He is brought urgently to the Cath Lab by Dr. Claiborne Billings who performed PCI and drug-eluting stenting.  Assessment & Plan    1: Inferior STEMI-postop day 1 inferior STEMI.  His left system was free of disease.  He had a difficult case with tortuosity and calcification requiring aggressive guide support and guide liner ultimately successfully implanting a drug-eluting stent at the "Genu " and proximal RCA with restoration of antegrade flow.  His peak troponin was 13,000.  He is on dual antiplatelet therapy with aspirin and Brilinta.  2D echo is pending.  2: Hyperlipidemia-recently started on statin therapy.  LDL fell from the 120 range down to the 60 range.  Maintain current dosing.  3: Essential hypertension-was on lisinopril hydrochlorothiazide as an outpatient.  Currently this is held.  Blood pressures are well controlled on low-dose beta-blocker which we will titrate.  He may be able to be discharged on beta-blocker alone  Postop day 1 inferior STEMI.  Hemodynamically stable.  Asymptomatic.  Slight ecchymosis right upper extremity but radial puncture site appears stable as well.  Okay to transfer to telemetry.  Cardiac rehab.  He is having a few PVCs on telemetry and we will titrate his beta-blocker.  Anticipate discharge tomorrow (fast-track).  For questions or updates, please contact [place] Please consult [email or web address] for contact info under        Signed, Quay Burow, MD  01/18/2021, 8:36 AM

## 2021-01-18 NOTE — Progress Notes (Signed)
CARDIAC REHAB PHASE I   PRE:  Rate/Rhythm: 64 SR  BP:  Supine: 123/77  Sitting:   Standing:    SaO2: 95%RA  MODE:  Ambulation: 600 ft   POST:  Rate/Rhythm: 74 SR  BP:  Supine:   Sitting: 132/83  Standing:    SaO2: 99%RA 1320-1430 Pt walked 600 ft on RA with steady gait and no CP. Tolerated well. MI education completed with pt and wife who voiced understanding. Stressed importance of brilinta with stent. Reviewed NTG use, MI restrictions, walking for ex, heart healthy food choices, and CRP 2. Referred to [facility] program.  Pt is very active with swimming and golf.   Graylon Good, RN BSN  01/18/2021 2:26 PM

## 2021-01-19 ENCOUNTER — Encounter (HOSPITAL_COMMUNITY): Payer: Self-pay | Admitting: Cardiovascular Disease

## 2021-01-19 DIAGNOSIS — I5020 Unspecified systolic (congestive) heart failure: Secondary | ICD-10-CM

## 2021-01-19 DIAGNOSIS — E785 Hyperlipidemia, unspecified: Secondary | ICD-10-CM

## 2021-01-19 MED ORDER — LOSARTAN POTASSIUM 25 MG PO TABS: 25.0000 mg | ORAL_TABLET | Freq: Every day | ORAL | 11 refills | Status: DC

## 2021-01-19 MED ORDER — METOPROLOL SUCCINATE ER 25 MG PO TB24
25.0000 mg | ORAL_TABLET | Freq: Every day | ORAL | Status: DC
  Administered 2021-01-19: 25 mg via ORAL
  Filled 2021-01-19: qty 1

## 2021-01-19 MED ORDER — TICAGRELOR 90 MG PO TABS: 90.0000 mg | ORAL_TABLET | Freq: Two times a day (BID) | ORAL | 11 refills | Status: DC

## 2021-01-19 MED ORDER — METOPROLOL SUCCINATE ER 50 MG PO TB24: 50.0000 mg | ORAL_TABLET | Freq: Every day | ORAL | Status: DC

## 2021-01-19 MED ORDER — FISH OIL 1200 MG PO CAPS: 1.0000 | ORAL_CAPSULE | Freq: Every day | ORAL | 1 refills | Status: AC

## 2021-01-19 MED ORDER — LOSARTAN POTASSIUM 25 MG PO TABS
25.0000 mg | ORAL_TABLET | Freq: Every day | ORAL | Status: DC
  Administered 2021-01-19: 25 mg via ORAL
  Filled 2021-01-19: qty 1

## 2021-01-19 MED ORDER — METOPROLOL SUCCINATE ER 25 MG PO TB24: 25.0000 mg | ORAL_TABLET | Freq: Every day | ORAL | 11 refills | Status: DC

## 2021-01-19 MED ORDER — ROSUVASTATIN CALCIUM 40 MG PO TABS: 40.0000 mg | ORAL_TABLET | Freq: Every day | ORAL | 11 refills | Status: DC

## 2021-01-19 MED ORDER — NITROGLYCERIN 0.4 MG SL SUBL: 0.4000 mg | SUBLINGUAL_TABLET | SUBLINGUAL | 3 refills | Status: AC | PRN

## 2021-01-19 MED ORDER — ASPIRIN 81 MG PO CHEW: 81.0000 mg | CHEWABLE_TABLET | Freq: Every day | ORAL | Status: AC

## 2021-01-19 NOTE — Progress Notes (Signed)
Progress Note  Patient Name: Bryan [name] Date of Encounter: 01/19/2021  [facility] Cardiologist: Dr. Ellouise Newer  Subjective   No CP   no  SOB     Inpatient Medications    Scheduled Meds:  aspirin  81 mg Oral Daily   Chlorhexidine Gluconate Cloth  6 each Topical Daily   metoprolol tartrate  25 mg Oral BID   rosuvastatin  40 mg Oral q1800   sodium chloride flush  3 mL Intravenous Q12H   ticagrelor  90 mg Oral BID   Continuous Infusions:  sodium chloride     PRN Meds: sodium chloride, acetaminophen, ondansetron (ZOFRAN) IV, sodium chloride flush, zolpidem   Vital Signs    Vitals:   01/18/21 1247 01/18/21 1618 01/18/21 2100 01/19/21 0604  BP: 124/77 122/82 110/83 119/85  Pulse: 66 60 62 62  Resp: '18 15 17 18  '$ Temp: 98.1 F (36.7 C) 98 F (36.7 C) 97.6 F (36.4 C) 98.1 F (36.7 C)  TempSrc: Oral Oral Oral Oral  SpO2: 99%  96% 99%  Weight:      Height:        Intake/Output Summary (Last 24 hours) at 01/19/2021 0817 Last data filed at 01/18/2021 1100 Gross per 24 hour  Intake --  Output 350 ml  Net -350 ml   Last 3 Weights 01/17/2021 12/19/2020 06/06/2020  Weight (lbs) 211 lb 211 lb 6.4 oz 216 lb 1.6 oz  Weight (kg) 95.709 kg 95.89 kg 98.022 kg      Telemetry    Sinus rhythm - Personally Reviewed  ECG   No new   Personally Reviewed  Physical Exam   GEN: No acute distress.   Neck: No JVD Cardiac: RRR, no murmurs,  Respiratory: Clear to auscultation bilaterally. GI: Soft, nontender, non-distended  MS: No edema; No deformity.  Bruise on R arm  Wrist soft   Neuro:  Nonfocal  Psych: Normal affect   Labs    High Sensitivity Troponin:   Recent Labs  Lab 01/17/21 1208 01/17/21 1640  TROPONINIHS 53* 13,997*      Chemistry Recent Labs  Lab 01/17/21 1208 01/17/21 1227 01/18/21 0125  NA 137 134* 138  K 3.8 3.8 3.7  CL 102 100 107  CO2 23  --  21*  GLUCOSE 142* 129* 97  BUN 26* 26* 20  CREATININE 1.26* 1.10 1.15  CALCIUM 9.8  --  9.3   PROT 6.3*  --   --   ALBUMIN 4.0  --   --   AST 28  --   --   ALT 24  --   --   ALKPHOS 38  --   --   BILITOT 1.6*  --   --   GFRNONAA >60  --  >60  ANIONGAP 12  --  10     Hematology Recent Labs  Lab 01/17/21 1208 01/17/21 1227 01/18/21 0125  WBC 11.6*  --  9.5  RBC 5.40  --  5.31  HGB 16.8 15.0 16.6  HCT 48.6 44.0 50.3  MCV 90.0  --  94.7  MCH 31.1  --  31.3  MCHC 34.6  --  33.0  RDW 12.3  --  12.6  PLT 214  --  187    BNPNo results for input(s): BNP, PROBNP in the last 168 hours.   DDimer No results for input(s): DDIMER in the last 168 hours.   Radiology    CARDIAC CATHETERIZATION  Result Date: 01/17/2021 Formatting of this  result is different from the original.   Mid RCA to Dist RCA lesion is 100% stenosed.   Dist RCA lesion is 30% stenosed.   RPAV lesion is 60% stenosed.   RPDA lesion is 100% stenosed.   Ost RCA to Prox RCA lesion is 80% stenosed.   Prox RCA lesion is 40% stenosed.   A stent was successfully placed.   A stent was successfully placed.   Post intervention, there is a 0% residual stenosis.   Post intervention, there is a 0% residual stenosis.   LV end diastolic pressure is moderately elevated. Acute ST segment elevation inferior wall myocardial infarction secondary to mid-distal thrombotic occlusion of the large dominant RCA. No significant obstructive disease in the left coronary circulation. Very difficult but successful coronary intervention to the RCA with significant catheter backup difficulties, calcified vessel with tortuosity and needing GuideLiner support for ultimate stenting near the acute margin with a 3.5 x 15 mm Resolute Onyx Frontier stent postdilated to 3.8 mm with the 100% occlusion being reduced to 0% and successful stenting of the very proximal RCA with ultimate insertion of a 3.5 x 18 mm Resolute Onyx Frontier stent postdilated to 3.85 mm with both stenosis being reduced to 0%.  There is diffuse irregularity in the RCA with 40% proximal to  mid calcified stenosis, 30% distal stenosis with 60% stenosis in the PLA vessel and thrombotic occlusion of the distal portion of the PDA vessel for which Aggrenox was initiated. RECOMMENDATION: DAPT for minimum of 12 months but probably longer.  Aggressive lipid-lowering therapy with target LDL less than 70 and preferably in the 50s or below.  Medical therapy for concomitant CAD.   ECHOCARDIOGRAM COMPLETE  Result Date: 01/18/2021    ECHOCARDIOGRAM REPORT   Patient Name:   Bryan [name] Date of Exam: 01/18/2021 Medical Rec #:  [id number]       Height:       75.0 in Accession #:    [id number]      Weight:       211.0 lb Date of Birth:  [birth year]      BSA:          2.245 m Patient Age:    75 years        BP:           129/92 mmHg Patient Gender: M               HR:           60 bpm. Exam Location:  Inpatient Procedure: 2D Echo, Color Doppler and Cardiac Doppler Indications:    Acute MI i21.9  History:        Patient has no prior history of Echocardiogram examinations.                 Risk Factors:Hypertension.  Sonographer:    Raquel Sarna Senior RDCS Referring Phys: [place]  1. Left ventricular ejection fraction, by estimation, is 35 to 40%. The left ventricle has moderately decreased function. The left ventricle demonstrates regional wall motion abnormalities (see scoring diagram/findings for description). The left ventricular internal cavity size was mildly dilated. Left ventricular diastolic parameters are consistent with Grade I diastolic dysfunction (impaired relaxation). There is severe hypokinesis of the left ventricular, mid inferoseptal wall.  2. Right ventricular systolic function is normal. The right ventricular size is normal.  3. Right atrial size was mildly dilated.  4. The mitral valve is normal in structure. No evidence of mitral  valve regurgitation.  5. The aortic valve is grossly normal. Aortic valve regurgitation is mild.  6. Aortic dilatation noted. There is moderate  dilatation of the ascending aorta, measuring 45 mm. FINDINGS  Left Ventricle: Left ventricular ejection fraction, by estimation, is 35 to 40%. The left ventricle has moderately decreased function. The left ventricle demonstrates regional wall motion abnormalities. Severe hypokinesis of the left ventricular, mid inferoseptal wall. The left ventricular internal cavity size was mildly dilated. There is no left ventricular hypertrophy. Left ventricular diastolic parameters are consistent with Grade I diastolic dysfunction (impaired relaxation). Right Ventricle: The right ventricular size is normal. Right vetricular wall thickness was not well visualized. Right ventricular systolic function is normal. Left Atrium: Left atrial size was normal in size. Right Atrium: Right atrial size was mildly dilated. Pericardium: There is no evidence of pericardial effusion. Mitral Valve: The mitral valve is normal in structure. No evidence of mitral valve regurgitation. Tricuspid Valve: The tricuspid valve is grossly normal. Tricuspid valve regurgitation is trivial. Aortic Valve: The aortic valve is grossly normal. Aortic valve regurgitation is mild. Aortic regurgitation PHT measures 559 msec. Pulmonic Valve: The pulmonic valve was normal in structure. Pulmonic valve regurgitation is not visualized. Aorta: Aortic dilatation noted. There is moderate dilatation of the ascending aorta, measuring 45 mm. IAS/Shunts: The atrial septum is grossly normal.  LEFT VENTRICLE PLAX 2D LVIDd:         5.10 cm  Diastology LVIDs:         4.20 cm  LV e' medial:    4.68 cm/s LV PW:         0.90 cm  LV E/e' medial:  8.1 LV IVS:        0.90 cm  LV e' lateral:   8.05 cm/s LVOT diam:     2.90 cm  LV E/e' lateral: 4.7 LV SV:         111 LV SV Index:   49 LVOT Area:     6.61 cm  RIGHT VENTRICLE RV S prime:     17.70 cm/s TAPSE (M-mode): 3.0 cm LEFT ATRIUM           Index       RIGHT ATRIUM           Index LA diam:      3.70 cm 1.65 cm/m  RA Area:     19.60  cm LA Vol (A2C): 26.4 ml 11.76 ml/m RA Volume:   53.60 ml  23.88 ml/m LA Vol (A4C): 22.6 ml 10.07 ml/m  AORTIC VALVE LVOT Vmax:   83.60 cm/s LVOT Vmean:  48.700 cm/s LVOT VTI:    0.168 m AI PHT:      559 msec  AORTA Ao Root diam: 4.50 cm MITRAL VALVE MV Area (PHT): 2.24 cm    SHUNTS MV Decel Time: 339 msec    Systemic VTI:  0.17 m MV E velocity: 37.70 cm/s  Systemic Diam: 2.90 cm MV A velocity: 48.40 cm/s MV E/A ratio:  0.78 Mertie Moores MD Electronically signed by Mertie Moores MD Signature Date/Time: 01/18/2021/3:29:41 PM    Final     Cardiac Studies    Echo 12/18/20    1. Left ventricular ejection fraction, by estimation, is 35 to 40%. The  left ventricle has moderately decreased function. The left ventricle  demonstrates regional wall motion abnormalities (see scoring  diagram/findings for description). The left  ventricular internal cavity size was mildly dilated. Left ventricular  diastolic parameters are consistent with Grade I  diastolic dysfunction  (impaired relaxation). There is severe hypokinesis of the left  ventricular, mid inferoseptal wall.   2. Right ventricular systolic function is normal. The right ventricular  size is normal.   3. Right atrial size was mildly dilated.   4. The mitral valve is normal in structure. No evidence of mitral valve  regurgitation.   5. The aortic valve is grossly normal. Aortic valve regurgitation is  mild.   6. Aortic dilatation noted. There is moderate dilatation of the ascending  aorta, measuring 45 mm.  Cardiac catheterization/PCI drug-eluting stent RCA (01/17/2021)   Conclusion      Mid RCA to Dist RCA lesion is 100% stenosed.   Dist RCA lesion is 30% stenosed.   RPAV lesion is 60% stenosed.   RPDA lesion is 100% stenosed.   Ost RCA to Prox RCA lesion is 80% stenosed.   Prox RCA lesion is 40% stenosed.   A stent was successfully placed.   A stent was successfully placed.   Post intervention, there is a 0% residual stenosis.    Post intervention, there is a 0% residual stenosis.   LV end diastolic pressure is moderately elevated.   Acute ST segment elevation inferior wall myocardial infarction secondary to mid-distal thrombotic occlusion of the large dominant RCA.   No significant obstructive disease in the left coronary circulation.   Very difficult but successful coronary intervention to the RCA with significant catheter backup difficulties, calcified vessel with tortuosity and needing GuideLiner support for ultimate stenting near the acute margin with a 3.5 x 15 mm Resolute Onyx Frontier stent postdilated to 3.8 mm with the 100% occlusion being reduced to 0% and successful stenting of the very proximal RCA with ultimate insertion of a 3.5 x 18 mm Resolute Onyx Frontier stent postdilated to 3.85 mm with both stenosis being reduced to 0%.  There is diffuse irregularity in the RCA with 40% proximal to mid calcified stenosis, 30% distal stenosis with 60% stenosis in the PLA vessel and thrombotic occlusion of the distal portion of the PDA vessel for which Aggrenox was initiated.   RECOMMENDATION: DAPT for minimum of 12 months but probably longer.  Aggressive lipid-lowering therapy with target LDL less than 70 and preferably in the 50s or below.  Medical therapy for concomitant CAD.  Coronary Diagrams   Diagnostic Dominance: Right    Intervention      Patient Profile     75 y.o. male 75 year old mildly overweight married Caucasian male without prior cardiac history of hypertension and hyperlipidemia both treated.  He developed chest pain nausea yesterday was brought to the emergency where he was found to have inferior ST segment elevation.  He is brought urgently to the Cath Lab by Dr. Claiborne Billings who performed PCI and drug-eluting stenting.  Assessment & Plan    1: Inferior STEMI-Day 2 from  inferior STEMI.  His left system was free of disease.  He had a difficult case with tortuosity and calcification requiring  aggressive guide support and guide liner ultimately successfully implanting a drug-eluting stent at the "Genu " and proximal RCA with restoration of antegrade flow.  His peak troponin was 13,000.  He is on dual antiplatelet therapy with aspirin and Brilinta.   2  Systolic CHF   Echo yesterday as noted above   Plan for f/u in 3 months to assess recovery    Volume is OK     3   Hyperlipidemia-recently started on statin therapy.  LDL fell from the 120 range  down to the 60 range.  Currently on high dose statin   Follow   When he comes to clinic I would recomm a lipomed panel with Lp(a) and Apo B     4: Essential hypertension-was on lisinopril hydrochlorothiazide as an outpatient.     On metoprolol   WIll switch to Toprol XL and add low dose Cozaar given LVEF down   Follow BP and HR aas outpt  5  Aorta   Dillated   45 mm   WIll need t obe followed   For questions or updates, please contact [place] Please consult [email or web address] for contact info under        Signed, Dorris Carnes, MD  01/19/2021, 8:17 AM

## 2021-01-19 NOTE — Discharge Summary (Signed)
Discharge Summary    Patient ID: ADAM RENTSCHLER MRN: [id number]; DOB: [birth year]  Admit date: 01/17/2021 Discharge date: 01/19/2021  PCP:  Caren Macadam, MD   [facility] Providers Cardiologist:  Shelva Majestic, MD   New    Discharge Diagnoses    Principal Problem:   ST elevation myocardial infarction involving right coronary artery [facility]) Active Problems:   STEMI involving right coronary artery ([place])    Diagnostic Studies/Procedures    ECHO: 01/19/2019  1. Left ventricular ejection fraction, by estimation, is 35 to 40%. The  left ventricle has moderately decreased function. The left ventricle  demonstrates regional wall motion abnormalities (see scoring  diagram/findings for description). The left ventricular internal cavity size was mildly dilated. Left ventricular diastolic parameters are consistent with Grade I diastolic dysfunction (impaired relaxation). There is severe hypokinesis of the left ventricular, and  mid-inferoseptal wall.   2. Right ventricular systolic function is normal. The right ventricular  size is normal.   3. Right atrial size was mildly dilated.   4. The mitral valve is normal in structure. No evidence of mitral valve regurgitation.   5. The aortic valve is grossly normal. Aortic valve regurgitation is  mild.   6. Aortic dilatation noted. There is moderate dilatation of the ascending aorta, measuring 45 mm.   CARDIAC CATH: 01/18/2019   Mid RCA to Dist RCA lesion is 100% stenosed.   Dist RCA lesion is 30% stenosed.   RPAV lesion is 60% stenosed.   RPDA lesion is 100% stenosed.   Ost RCA to Prox RCA lesion is 80% stenosed.   Prox RCA lesion is 40% stenosed.   A stent was successfully placed.   A stent was successfully placed.   Post intervention, there is a 0% residual stenosis.   Post intervention, there is a 0% residual stenosis.   LV end diastolic pressure is moderately elevated.   Acute ST segment elevation inferior wall myocardial  infarction secondary to mid-distal thrombotic occlusion of the large dominant RCA.   No significant obstructive disease in the left coronary circulation.   Very difficult but successful coronary intervention to the RCA with significant catheter backup difficulties, calcified vessel with tortuosity and needing GuideLiner support for ultimate stenting near the acute margin with a 3.5 x 15 mm Resolute Onyx Frontier stent postdilated to 3.8 mm with the 100% occlusion being reduced to 0% and successful stenting of the very proximal RCA with ultimate insertion of a 3.5 x 18 mm Resolute Onyx Frontier stent postdilated to 3.85 mm with both stenosis being reduced to 0%.  There is diffuse irregularity in the RCA with 40% proximal to mid calcified stenosis, 30% distal stenosis with 60% stenosis in the PLA vessel and thrombotic occlusion of the distal portion of the PDA vessel for which Aggrenox was initiated.   RECOMMENDATION: DAPT for minimum of 12 months but probably longer.  Aggressive lipid-lowering therapy with target LDL less than 70 and preferably in the 50s or below.  Medical therapy for concomitant CAD.  Diagnostic Dominance: Right    Intervention     _____________   History of Present Illness     JEF HOGUE is a 75 y.o. male with history of hypertension, hyperlipidemia, osteoarthritis, GERD, depression/anxiety, was admitted 01/17/2021 with an inferior STEMI.  Hospital Course     Consultants: None  1: Inferior STEMI-Day 2 from  inferior STEMI.  His left system was free of disease.  He had a difficult case with tortuosity and calcification requiring  aggressive guide support and guide liner.   Ultimately successful implantion of a drug-eluting stent at the "Genu" and proximal RCA with restoration of antegrade flow.  His peak troponin was 13,000.  He is on dual antiplatelet therapy with aspirin and Brilinta.    2  Systolic CHF   Echo yesterday as noted above   Plan for f/u in 3 months  to assess recovery. Volume is OK with no signs or symptoms of CHF on exam.   3   Hyperlipidemia-recently started on statin therapy.  LDL fell from the 120 range down to the 60 range.  Currently on high dose statin.  Follow   When he comes to clinic I would recomm a lipomed panel with Lp(a) and Apo B     4: Essential hypertension-was on lisinopril hydrochlorothiazide as an outpatient.  He is currently tolerating metoprolol   WIll switch to Toprol XL and add low dose Cozaar given LVEF down   Follow BP and HR aas outpt.  Tolerated both meds here.   5  Aorta   Dillated   45 mm   WIll need to be followed    On 01/19/2021, he was seen by Dr. Harrington Challenger and all data were reviewed.  He tolerated initial doses of beta-blocker and ARB.  He was seen by cardiac rehab and ambulated well.  No further inpatient work-up is indicated and he is considered stable for discharge, to follow-up as an outpatient.  Because of the aspirin and Brilinta, he is to stop the Mobic.  Because he is on beta-blocker and ARB, he is to stop the lisinopril/HCTZ.  On the prescription for his fish oil, it was entered as fish oil 1200 mg, 1200 capsules daily.  The prescription was changed to 1200 mg, 1 capsule daily.  Other home medications are as prior to admission.  Did the patient have an acute coronary syndrome (MI, NSTEMI, STEMI, etc) this admission?:  Yes                               AHA/ACC Clinical Performance & Quality Measures: Aspirin prescribed? - Yes ADP Receptor Inhibitor (Plavix/Clopidogrel, Brilinta/Ticagrelor or Effient/Prasugrel) prescribed (includes medically managed patients)? - Yes Beta Blocker prescribed? - Yes High Intensity Statin (Lipitor 40-'80mg'$  or Crestor 20-'40mg'$ ) prescribed? - Yes EF assessed during THIS hospitalization? - Yes For EF <40%, was ACEI/ARB prescribed? - Yes For EF <40%, Aldosterone Antagonist (Spironolactone or Eplerenone) prescribed? - No - Reason: Do not believe blood pressure would  tolerate Cardiac Rehab Phase II ordered (including medically managed patients)? - Yes      _____________  Discharge Vitals Blood pressure 123/84, pulse 62, temperature 98.3 F (36.8 C), temperature source Oral, resp. rate 18, height '6\' 3"'$  (1.905 m), weight 95.7 kg, SpO2 96 %.  Filed Weights   01/17/21 1537  Weight: 95.7 kg    Labs & Radiologic Studies    CBC Recent Labs    01/17/21 1208 01/17/21 1227 01/18/21 0125  WBC 11.6*  --  9.5  HGB 16.8 15.0 16.6  HCT 48.6 44.0 50.3  MCV 90.0  --  94.7  PLT 214  --  [id number]   Basic Metabolic Panel Recent Labs    01/17/21 1208 01/17/21 1227 01/18/21 0125  NA 137 134* 138  K 3.8 3.8 3.7  CL 102 100 107  CO2 23  --  21*  GLUCOSE 142* 129* 97  BUN 26* 26* 20  CREATININE 1.26*  1.10 1.15  CALCIUM 9.8  --  9.3   Liver Function Tests Recent Labs    01/17/21 1208  AST 28  ALT 24  ALKPHOS 38  BILITOT 1.6*  PROT 6.3*  ALBUMIN 4.0   No results for input(s): LIPASE, AMYLASE in the last 72 hours. High Sensitivity Troponin:   Recent Labs  Lab 01/17/21 1208 01/17/21 1640  TROPONINIHS 53* 13,997*    BNP Invalid input(s): POCBNP D-Dimer No results for input(s): DDIMER in the last 72 hours. Hemoglobin A1C Recent Labs    01/17/21 1208  HGBA1C 5.5   Fasting Lipid Panel Recent Labs    01/17/21 1208  CHOL 125  HDL 37*  LDLCALC 66  TRIG 109  CHOLHDL 3.4   Thyroid Function Tests No results for input(s): TSH, T4TOTAL, T3FREE, THYROIDAB in the last 72 hours.  Invalid input(s): FREET3 _____________  CARDIAC CATHETERIZATION  Result Date: 01/17/2021 Formatting of this result is different from the original.   Mid RCA to Dist RCA lesion is 100% stenosed.   Dist RCA lesion is 30% stenosed.   RPAV lesion is 60% stenosed.   RPDA lesion is 100% stenosed.   Ost RCA to Prox RCA lesion is 80% stenosed.   Prox RCA lesion is 40% stenosed.   A stent was successfully placed.   A stent was successfully placed.   Post intervention, there  is a 0% residual stenosis.   Post intervention, there is a 0% residual stenosis.   LV end diastolic pressure is moderately elevated. Acute ST segment elevation inferior wall myocardial infarction secondary to mid-distal thrombotic occlusion of the large dominant RCA. No significant obstructive disease in the left coronary circulation. Very difficult but successful coronary intervention to the RCA with significant catheter backup difficulties, calcified vessel with tortuosity and needing GuideLiner support for ultimate stenting near the acute margin with a 3.5 x 15 mm Resolute Onyx Frontier stent postdilated to 3.8 mm with the 100% occlusion being reduced to 0% and successful stenting of the very proximal RCA with ultimate insertion of a 3.5 x 18 mm Resolute Onyx Frontier stent postdilated to 3.85 mm with both stenosis being reduced to 0%.  There is diffuse irregularity in the RCA with 40% proximal to mid calcified stenosis, 30% distal stenosis with 60% stenosis in the PLA vessel and thrombotic occlusion of the distal portion of the PDA vessel for which Aggrenox was initiated. RECOMMENDATION: DAPT for minimum of 12 months but probably longer.  Aggressive lipid-lowering therapy with target LDL less than 70 and preferably in the 50s or below.  Medical therapy for concomitant CAD.   ECHOCARDIOGRAM COMPLETE  Result Date: 01/18/2021    ECHOCARDIOGRAM REPORT   Patient Name:   KEONTAE EBSEN Date of Exam: 01/18/2021 Medical Rec #:  [id number]       Height:       75.0 in Accession #:    [id number]      Weight:       211.0 lb Date of Birth:  1946-01-23      BSA:          2.245 m Patient Age:    44 years        BP:           129/92 mmHg Patient Gender: M               HR:           60 bpm. Exam Location:  Inpatient Procedure: 2D Echo, Color Doppler and  Cardiac Doppler Indications:    Acute MI i21.9  History:        Patient has no prior history of Echocardiogram examinations.                 Risk Factors:Hypertension.   Sonographer:    Raquel Sarna Senior RDCS Referring Phys: [place]  1. Left ventricular ejection fraction, by estimation, is 35 to 40%. The left ventricle has moderately decreased function. The left ventricle demonstrates regional wall motion abnormalities (see scoring diagram/findings for description). The left ventricular internal cavity size was mildly dilated. Left ventricular diastolic parameters are consistent with Grade I diastolic dysfunction (impaired relaxation). There is severe hypokinesis of the left ventricular, mid inferoseptal wall.  2. Right ventricular systolic function is normal. The right ventricular size is normal.  3. Right atrial size was mildly dilated.  4. The mitral valve is normal in structure. No evidence of mitral valve regurgitation.  5. The aortic valve is grossly normal. Aortic valve regurgitation is mild.  6. Aortic dilatation noted. There is moderate dilatation of the ascending aorta, measuring 45 mm. FINDINGS  Left Ventricle: Left ventricular ejection fraction, by estimation, is 35 to 40%. The left ventricle has moderately decreased function. The left ventricle demonstrates regional wall motion abnormalities. Severe hypokinesis of the left ventricular, mid inferoseptal wall. The left ventricular internal cavity size was mildly dilated. There is no left ventricular hypertrophy. Left ventricular diastolic parameters are consistent with Grade I diastolic dysfunction (impaired relaxation). Right Ventricle: The right ventricular size is normal. Right vetricular wall thickness was not well visualized. Right ventricular systolic function is normal. Left Atrium: Left atrial size was normal in size. Right Atrium: Right atrial size was mildly dilated. Pericardium: There is no evidence of pericardial effusion. Mitral Valve: The mitral valve is normal in structure. No evidence of mitral valve regurgitation. Tricuspid Valve: The tricuspid valve is grossly normal. Tricuspid valve  regurgitation is trivial. Aortic Valve: The aortic valve is grossly normal. Aortic valve regurgitation is mild. Aortic regurgitation PHT measures 559 msec. Pulmonic Valve: The pulmonic valve was normal in structure. Pulmonic valve regurgitation is not visualized. Aorta: Aortic dilatation noted. There is moderate dilatation of the ascending aorta, measuring 45 mm. IAS/Shunts: The atrial septum is grossly normal.  LEFT VENTRICLE PLAX 2D LVIDd:         5.10 cm  Diastology LVIDs:         4.20 cm  LV e' medial:    4.68 cm/s LV PW:         0.90 cm  LV E/e' medial:  8.1 LV IVS:        0.90 cm  LV e' lateral:   8.05 cm/s LVOT diam:     2.90 cm  LV E/e' lateral: 4.7 LV SV:         111 LV SV Index:   49 LVOT Area:     6.61 cm  RIGHT VENTRICLE RV S prime:     17.70 cm/s TAPSE (M-mode): 3.0 cm LEFT ATRIUM           Index       RIGHT ATRIUM           Index LA diam:      3.70 cm 1.65 cm/m  RA Area:     19.60 cm LA Vol (A2C): 26.4 ml 11.76 ml/m RA Volume:   53.60 ml  23.88 ml/m LA Vol (A4C): 22.6 ml 10.07 ml/m  AORTIC VALVE LVOT Vmax:  83.60 cm/s LVOT Vmean:  48.700 cm/s LVOT VTI:    0.168 m AI PHT:      559 msec  AORTA Ao Root diam: 4.50 cm MITRAL VALVE MV Area (PHT): 2.24 cm    SHUNTS MV Decel Time: 339 msec    Systemic VTI:  0.17 m MV E velocity: 37.70 cm/s  Systemic Diam: 2.90 cm MV A velocity: 48.40 cm/s MV E/A ratio:  0.78 Mertie Moores MD Electronically signed by Mertie Moores MD Signature Date/Time: 01/18/2021/3:29:41 PM    Final    Disposition   Pt is being discharged home today in good condition.  Follow-up Plans & Appointments     Discharge Instructions     Amb Referral to Cardiac Rehabilitation   Complete by: As directed    Diagnosis:  Coronary Stents STEMI     After initial evaluation and assessments completed: Virtual Based Care may be provided alone or in conjunction with Phase 2 Cardiac Rehab based on patient barriers.: Yes   Call MD for:  redness, tenderness, or signs of infection (pain,  swelling, redness, odor or green/yellow discharge around incision site)   Complete by: As directed    Diet - low sodium heart healthy   Complete by: As directed    Increase activity slowly   Complete by: As directed        Discharge Medications   Allergies as of 01/19/2021       Reactions   Codeine Other (See Comments)   "made me sick"        Medication List     STOP taking these medications    lisinopril-hydrochlorothiazide 20-25 MG tablet Commonly known as: ZESTORETIC   meloxicam 15 MG tablet Commonly known as: MOBIC       TAKE these medications    acetaminophen 500 MG tablet Commonly known as: TYLENOL Take 500 mg by mouth daily as needed for moderate pain.   aspirin 81 MG chewable tablet Chew 1 tablet (81 mg total) by mouth daily. Start taking on: January 20, 2021   calcium carbonate 500 MG chewable tablet Commonly known as: TUMS - dosed in mg elemental calcium Chew 2 tablets by mouth daily as needed for indigestion or heartburn.   Fish Oil 1200 MG Caps Take 1 capsule (1,200 mg total) by mouth daily. What changed: how much to take   HAIR SKIN AND NAILS FORMULA PO Take 1 tablet by mouth daily.   losartan 25 MG tablet Commonly known as: COZAAR Take 1 tablet (25 mg total) by mouth daily. Start taking on: January 20, 2021   metoprolol succinate 25 MG 24 hr tablet Commonly known as: TOPROL-XL Take 1 tablet (25 mg total) by mouth daily. Start taking on: January 20, 2021   nitroGLYCERIN 0.4 MG SL tablet Commonly known as: Nitrostat Place 1 tablet (0.4 mg total) under the tongue every 5 (five) minutes as needed for chest pain.   rosuvastatin 40 MG tablet Commonly known as: Crestor Take 1 tablet (40 mg total) by mouth daily. What changed:  medication strength how much to take   ticagrelor 90 MG Tabs tablet Commonly known as: BRILINTA Take 1 tablet (90 mg total) by mouth 2 (two) times daily.   traMADol 50 MG tablet Commonly known as: ULTRAM Take 1  tablet (50 mg total) by mouth every 6 (six) hours as needed for moderate pain.   vitamin B-12 1000 MCG tablet Commonly known as: CYANOCOBALAMIN Take 1,000 mcg by mouth daily.   vitamin C 250 MG  tablet Commonly known as: ASCORBIC ACID Take 250 mg by mouth daily.   Vitamin D3 50 MCG (2000 UT) Tabs Take 2,000 Units by mouth daily.       ASK your doctor about these medications    gabapentin 100 MG capsule Commonly known as: NEURONTIN TAKE TWO CAPSULES AT BEDTIME.   tiZANidine 4 MG tablet Commonly known as: ZANAFLEX TAKE 1 TABLET THREE TIMES DAILY AS NEEDED FOR MUSCLE SPASMS.           Outstanding Labs/Studies   None  Duration of Discharge Encounter   Greater than 30 minutes including physician time.  Signed, Rosaria Ferries, PA-C 01/19/2021, 4:22 PM

## 2021-01-19 NOTE — Progress Notes (Signed)
CARDIAC REHAB PHASE I   PRE:  Rate/Rhythm: 72 SR PVCs  BP:  Supine:   Sitting: 131/83  Standing:    SaO2: 99%RA  MODE:  Ambulation: 800 ft   POST:  Rate/Rhythm: 77 SR PVCs  BP:  Supine:   Sitting: 126/80  Standing:    SaO2: 99%RA [id number] Pt walked 800 ft on RA with steady gait and no CP. Tolerated well. Few PVCs. Hoping to go home today.   Graylon Good, RN BSN  01/19/2021 8:20 AM

## 2021-01-19 NOTE — Plan of Care (Signed)
  Problem: Education: Goal: Understanding of CV disease, CV risk reduction, and recovery process will improve Outcome: Completed/Met Goal: Individualized Educational Video(s) Outcome: Completed/Met   Problem: Activity: Goal: Ability to return to baseline activity level will improve Outcome: Completed/Met   Problem: Cardiovascular: Goal: Ability to achieve and maintain adequate cardiovascular perfusion will improve Outcome: Completed/Met Goal: Vascular access site(s) Level 0-1 will be maintained Outcome: Completed/Met   Problem: Health Behavior/Discharge Planning: Goal: Ability to safely manage health-related needs after discharge will improve Outcome: Completed/Met   Problem: Education: Goal: Knowledge of General Education information will improve Description: Including pain rating scale, medication(s)/side effects and non-pharmacologic comfort measures Outcome: Completed/Met   Problem: Health Behavior/Discharge Planning: Goal: Ability to manage health-related needs will improve Outcome: Completed/Met   Problem: Clinical Measurements: Goal: Ability to maintain clinical measurements within normal limits will improve Outcome: Completed/Met Goal: Will remain free from infection Outcome: Completed/Met Goal: Diagnostic test results will improve Outcome: Completed/Met Goal: Respiratory complications will improve Outcome: Completed/Met Goal: Cardiovascular complication will be avoided Outcome: Completed/Met   Problem: Activity: Goal: Risk for activity intolerance will decrease Outcome: Completed/Met   Problem: Nutrition: Goal: Adequate nutrition will be maintained Outcome: Completed/Met   Problem: Coping: Goal: Level of anxiety will decrease Outcome: Completed/Met   Problem: Elimination: Goal: Will not experience complications related to bowel motility Outcome: Completed/Met Goal: Will not experience complications related to urinary retention Outcome: Completed/Met    Problem: Pain Managment: Goal: General experience of comfort will improve Outcome: Completed/Met   Problem: Safety: Goal: Ability to remain free from injury will improve Outcome: Completed/Met   Problem: Skin Integrity: Goal: Risk for impaired skin integrity will decrease Outcome: Completed/Met  Discharge instructions reviewed with patient and with wife.  These included, but were not limited to, the following:  follow-up appointments, when to call the MD, review of new medications with potential side effects, dietary and activity recommendations, prescriptions, use of nitroglycerin in treatment of chest pain, etc.  Comprehension of information ascertained via use of "teach-back" technique.  Pt discharged to private residence via private vehicle with spouse.  Escorted to exit via wheelchair by nurse tech.

## 2021-01-21 ENCOUNTER — Telehealth: Payer: Self-pay | Admitting: Cardiovascular Disease

## 2021-01-21 NOTE — Telephone Encounter (Signed)
Patient wants to know if he can drive. He states that it will be short stops, like to the gym and out to lunch with friends. He will not be driving long distances. Please advise.

## 2021-01-21 NOTE — Telephone Encounter (Signed)
Patient's heart attack on August 4 and difficult but successful intervention, it is advisable that he back to the gym until his initial office visit.  The patient can travel in a car but least until seen I would prefer he not drive himself to make certain he is stable cardiovascular

## 2021-01-21 NOTE — Telephone Encounter (Signed)
Attempted to call patient, left message for patient to call back when able.   Will forward to MD to advise on driving.

## 2021-01-22 NOTE — Telephone Encounter (Signed)
Called patient, advised of message from Dr.Kelly to not drive.  Patient verbalized understanding.  Thankful for call back.

## 2021-01-24 ENCOUNTER — Telehealth (HOSPITAL_COMMUNITY): Payer: Self-pay

## 2021-01-24 NOTE — Telephone Encounter (Signed)
Called patient to see if he is interested in the Cardiac Rehab Program. Patient expressed interest. Explained scheduling process and went over insurance, patient verbalized understanding. Will contact patient for scheduling once f/u has been completed. Adv of 1-3 months backlog.

## 2021-01-24 NOTE — Telephone Encounter (Signed)
Pt insurance is active and benefits verified through [facility]. Co-pay $0.00, DED $0.00/$0.00 met, out of pocket $4,500.00/$70.00 met, co-insurance 0%. No pre-authorization required. Passport, 01/24/21 @ 9:28AM, VIF#[id number]   Will contact patient to see if he is interested in the Cardiac Rehab Program. If interested, patient will need to complete follow up appt. Once completed, patient will be contacted for scheduling upon review by the RN Navigator.

## 2021-01-29 ENCOUNTER — Telehealth: Payer: Self-pay | Admitting: Cardiovascular Disease

## 2021-01-29 NOTE — Telephone Encounter (Signed)
Left message to call back for further questions

## 2021-01-29 NOTE — Telephone Encounter (Signed)
Returned call to Wife she states that during the procedure the the site "backed up" (per Dr Claiborne Billings) and was told "not to move it foe the night" they did as instructed. She states that there are no other sx. She states that the bruising was worse and now it is getting better, the bruising is getting lighter in color. No erythremia or swelling. Just the constant pain when goes to bed, lying down.

## 2021-01-29 NOTE — Telephone Encounter (Signed)
Is the pain in the arm, hand, or fingers?  Any weakness or loss of sensation?

## 2021-01-29 NOTE — Telephone Encounter (Signed)
Pt called back he states that it does not hurt all day. It just hurts if he say... if he gets up to go to the bathroom during the night pain is maybe a 2/10. When he wakes up in the am it starts at 2/10 but "after he has gotten dressed maybe a 1/10 "its just a little sore". And inn a short period it is "mostly gone" it does hurt "when I mash on it". Informed him not to do any mashing. Pt sill call back if any worsening pain, weakness or decreased sensation. Verbalized understanding. Pt has an appointment with TK 02-05-21 he will discuss further then, if needed.

## 2021-01-29 NOTE — Telephone Encounter (Signed)
If worsening pain or any weakness/loss of sensation, would recommend RUE arterial duplex for further evaluation

## 2021-01-29 NOTE — Telephone Encounter (Signed)
Bryan [name], Wife of the patient called. The patient has been experiencing R Arm pain since his surgery with Dr. Claiborne Billings 01/17/21. The wife states that the pain is only present when he lays down to go to bed. The wife states that during the surgery the blood went back into his arm and he was told to keep the arm still. She wanted to know if this was normal and what she should do.

## 2021-01-29 NOTE — Telephone Encounter (Signed)
Wife called back an is unable to answer questions she will have pt call back.

## 2021-01-30 DIAGNOSIS — L820 Inflamed seborrheic keratosis: Secondary | ICD-10-CM | POA: Diagnosis not present

## 2021-01-30 DIAGNOSIS — L821 Other seborrheic keratosis: Secondary | ICD-10-CM | POA: Diagnosis not present

## 2021-01-30 DIAGNOSIS — D1801 Hemangioma of skin and subcutaneous tissue: Secondary | ICD-10-CM | POA: Diagnosis not present

## 2021-01-30 DIAGNOSIS — D692 Other nonthrombocytopenic purpura: Secondary | ICD-10-CM | POA: Diagnosis not present

## 2021-01-30 DIAGNOSIS — Z85828 Personal history of other malignant neoplasm of skin: Secondary | ICD-10-CM | POA: Diagnosis not present

## 2021-01-30 DIAGNOSIS — L570 Actinic keratosis: Secondary | ICD-10-CM | POA: Diagnosis not present

## 2021-02-05 ENCOUNTER — Ambulatory Visit: Payer: Medicare Other | Admitting: Cardiovascular Disease

## 2021-02-05 ENCOUNTER — Encounter: Payer: Self-pay | Admitting: Cardiovascular Disease

## 2021-02-05 ENCOUNTER — Other Ambulatory Visit: Payer: Self-pay

## 2021-02-05 VITALS — BP 130/72 | Ht 75.0 in | Wt 204.6 lb

## 2021-02-05 DIAGNOSIS — I2111 ST elevation (STEMI) myocardial infarction involving right coronary artery: Secondary | ICD-10-CM | POA: Diagnosis not present

## 2021-02-05 DIAGNOSIS — I10 Essential (primary) hypertension: Secondary | ICD-10-CM

## 2021-02-05 DIAGNOSIS — Z8719 Personal history of other diseases of the digestive system: Secondary | ICD-10-CM | POA: Diagnosis not present

## 2021-02-05 DIAGNOSIS — I255 Ischemic cardiomyopathy: Secondary | ICD-10-CM

## 2021-02-05 DIAGNOSIS — E785 Hyperlipidemia, unspecified: Secondary | ICD-10-CM

## 2021-02-05 MED ORDER — LOSARTAN POTASSIUM 25 MG PO TABS: 25.0000 mg | ORAL_TABLET | Freq: Two times a day (BID) | ORAL | 3 refills | Status: DC

## 2021-02-05 NOTE — Progress Notes (Signed)
Cardiology Office Note    Date:  02/07/2021   ID:  Bryan [name], Bryan [name] Mar 08, 1946, MRN [id number]  PCP:  Bryan Macadam, MD  Cardiologist:  Shelva Majestic, MD   Initial office visit following his inferior STEMI  History of Present Illness:  Bryan [name] is a 75 y.o. male who has a history of hypertension, hyperlipidemia, spinal stenosis and GERD.  He has remained fairly healthy and had exercised regularly swimming almost every day and playing golf multiple times per week.  He apparently has had issues with some anxiety.  On January 17, 2021 he had seen his CPA in the morning.  Upon going home he began to notice substernal chest tightness at around 10:30 AM associated with shortness of breath and nausea.  Symptoms initially resolved but ultimately symptoms returned.  EMS was called and he was found to have inferior STEMI on ECG.  Code STEMI was activated and he presented to [facility] and brought directly to the catheterization laboratory.  I performed emergent cardiac catheterization which demonstrated his acute ST segment elevation of his inferior wall secondary to mid-distal thrombotic occlusion of a large dominant calcified RCA.  There was no significant obstruction in the left coronary circulation.  His intervention was very difficult but ultimately successful.  There were significant catheter backup difficulties with his calcified and tortuous vessel needing GuideLiner support for ultimate stenting near the acute margin with a 3.5 x 15 mm Medtronic Onyx frontier stent postdilated to 3.8 mm with 100% occlusion being reduced to 0%.  He also had 80% proximal RCA stenosis which was successfully stented with a 3.5 x 18 mm stent postdilated to 3.85 mm.  He had additional diffuse irregularity in the RCA of 4030% in the proximal and distal segment with 60% stenosis in the PLA vessel.  There was thrombotic occlusion of the very distal portion of the PDA vessel for which Aggrenox was  initiated.  A subsequent echo Doppler study showed an ejection fraction reduced at 35 to 40% on January 18, 2021.  His hospital course was uneventful and he was discharged on January 19, 2021.  Prior to admission he was on lisinopril and hydrochlorothiazide as an outpatient for hypertension.  He was treated with beta-blocker therapy and currently is now on aspirin/Brilinta for DAPT, losartan 25 mg in addition to metoprolol succinate 25 mg daily post MI.  He is on rosuvastatin 40 mg and fish oil antiplatelet therapy.  Since hospital discharge he feels well.  He denies chest pain or shortness of breath.  He is anxious to get back to swimming pool for swimming.  He is unaware of any palpitations.  He denies exertional dyspnea.  He presents for his initial office evaluation with me.  Past Medical History:  Diagnosis Date   Anxiety    Cancer (Bryan [name])    facial skin cancer   Depression    GERD (gastroesophageal reflux disease)    takes tums rarely   Hematuria    1 time in july 2019   Hypertension    Osteoarthritis    Spinal stenosis    ST elevation myocardial infarction involving right coronary artery [facility])     Past Surgical History:  Procedure Laterality Date   ACHILLES TENDON SURGERY  02/2006   ANTERIOR LAT LUMBAR FUSION N/A 11/11/2017   Procedure: LUMBAR TWO- LUMBAR THREE, LUMBAR THREE- LUMBAR FOUR, LUMBAR FOUR- LUMBAR FIVE ANTERIOLATERAL INTERBODY ARTHRODESIS;  Surgeon: Jovita Gamma, MD;  Location: [place];  Service: Neurosurgery;  Laterality:  N/A;   COLONOSCOPY  2003   COLONOSCOPY     CORONARY STENT INTERVENTION N/A 01/17/2021   Procedure: CORONARY STENT INTERVENTION;  Surgeon: Troy Sine, MD;  Location: [place] [facility];  Service: Cardiovascular;  Laterality: N/A;   CORONARY/GRAFT ACUTE MI REVASCULARIZATION N/A 01/17/2021   Procedure: Coronary/Graft Acute MI Revascularization;  Surgeon: Troy Sine, MD;  Location: [place] [facility];  Service: Cardiovascular;  Laterality: N/A;    CYSTOSCOPY N/A 05/21/2018   Procedure: CYSTOSCOPY FLEXIBLE;  Surgeon: Festus Aloe, MD;  Location: [facility];  Service: Urology;  Laterality: N/A;   KNEE SURGERY Left 1991 and 2001   X2 arthroscopy   LEFT HEART CATH AND CORONARY ANGIOGRAPHY N/A 01/17/2021   Procedure: LEFT HEART CATH AND CORONARY ANGIOGRAPHY;  Surgeon: Troy Sine, MD;  Location: [place] [facility];  Service: Cardiovascular;  Laterality: N/A;   LUMBAR PERCUTANEOUS PEDICLE SCREW 3 LEVEL N/A 11/11/2017   Procedure: LUMBAR PERCUTANEOUS PEDICLE SCREW PLACEMENT LUMBAR TWO-THREE, LUMBER THREE-FOUR, LUMBAR FOUR-FIVE;  Surgeon: Jovita Gamma, MD;  Location: [place];  Service: Neurosurgery;  Laterality: N/A;   SPINE SURGERY  12-08, 5-10   TOTAL HIP ARTHROPLASTY     oct 2006,   TOTAL HIP ARTHROPLASTY Right 02/09/2018   Procedure: RIGHT TOTAL HIP ARTHROPLASTY ANTERIOR APPROACH;  Surgeon: Paralee Cancel, MD;  Location: [facility];  Service: Orthopedics;  Laterality: Right;    Current Medications: Outpatient Medications Prior to Visit  Medication Sig Dispense Refill   acetaminophen (TYLENOL) 500 MG tablet Take 500 mg by mouth daily as needed for moderate pain.     aspirin 81 MG chewable tablet Chew 1 tablet (81 mg total) by mouth daily.     calcium carbonate (TUMS - DOSED IN MG ELEMENTAL CALCIUM) 500 MG chewable tablet Chew 2 tablets by mouth daily as needed for indigestion or heartburn.     Cholecalciferol (VITAMIN D3) 2000 units TABS Take 2,000 Units by mouth daily.     gabapentin (NEURONTIN) 100 MG capsule TAKE TWO CAPSULES AT BEDTIME. (Patient taking differently: Take 200 mg by mouth at bedtime.) 180 capsule 1   metoprolol succinate (TOPROL-XL) 25 MG 24 hr tablet Take 1 tablet (25 mg total) by mouth daily. 30 tablet 11   Multiple Vitamins-Minerals (HAIR SKIN AND NAILS FORMULA PO) Take 1 tablet by mouth daily.     nitroGLYCERIN (NITROSTAT) 0.4 MG SL tablet Place 1 tablet (0.4 mg total) under the tongue every 5 (five)  minutes as needed for chest pain. 25 tablet 3   Omega-3 Fatty Acids (FISH OIL) 1200 MG CAPS Take 1 capsule (1,200 mg total) by mouth daily. 30 capsule 1   rosuvastatin (CRESTOR) 40 MG tablet Take 1 tablet (40 mg total) by mouth daily. 30 tablet 11   ticagrelor (BRILINTA) 90 MG TABS tablet Take 1 tablet (90 mg total) by mouth 2 (two) times daily. 60 tablet 11   tiZANidine (ZANAFLEX) 4 MG tablet TAKE 1 TABLET THREE TIMES DAILY AS NEEDED FOR MUSCLE SPASMS. (Patient taking differently: Take 4 mg by mouth every 8 (eight) hours as needed for muscle spasms.) 90 tablet 1   traMADol (ULTRAM) 50 MG tablet Take 1 tablet (50 mg total) by mouth every 6 (six) hours as needed for moderate pain. 120 tablet 2   vitamin B-12 (CYANOCOBALAMIN) 1000 MCG tablet Take 1,000 mcg by mouth daily.     vitamin C (ASCORBIC ACID) 250 MG tablet Take 250 mg by mouth daily.     losartan (COZAAR) 25  MG tablet Take 1 tablet (25 mg total) by mouth daily. 30 tablet 11   No facility-administered medications prior to visit.     Allergies:   Codeine   Social History   Socioeconomic History   Marital status: Married    Spouse name: Not on file   Number of children: Not on file   Years of education: Not on file   Highest education level: Not on file  Occupational History   Not on file  Tobacco Use   Smoking status: Never   Smokeless tobacco: Never  Vaping Use   Vaping Use: Never used  Substance and Sexual Activity   Alcohol use: No   Drug use: No   Sexual activity: Not on file  Other Topics Concern   Not on file  Social History Narrative   Divorced, Exercises regularly   Social Determinants of Health   Financial Resource Strain: Low Risk    Difficulty of Paying Living Expenses: Not hard at all  Food Insecurity: Not on file  Transportation Needs: No Transportation Needs   Lack of Transportation (Medical): No   Lack of Transportation (Non-Medical): No  Physical Activity: Not on file  Stress: Not on file  Social  Connections: Not on file    Socially he is married.  There is no tobacco use or alcohol use.  He exercises regularly.  Family History:  The patient's family history includes Coronary artery disease (age of onset: 35) in his mother; Diabetes in his mother; Hypertension in his mother; Other (age of onset: 44) in his father.   ROS General: Negative; No fevers, chills, or night sweats;  HEENT: Negative; No changes in vision or hearing, sinus congestion, difficulty swallowing Pulmonary: Negative; No cough, wheezing, shortness of breath, hemoptysis Cardiovascular: Negative; No chest pain, presyncope, syncope, palpitations GI: Negative; No nausea, vomiting, diarrhea, or abdominal pain GU: Negative; No dysuria, hematuria, or difficulty voiding Musculoskeletal: Negative; no myalgias, joint pain, or weakness Hematologic/Oncology: Negative; no easy bruising, bleeding Endocrine: Negative; no heat/cold intolerance; no diabetes Neuro: Negative; no changes in balance, headaches Skin: Negative; No rashes or skin lesions Psychiatric: Negative; No behavioral problems, depression Sleep: Negative; No snoring, daytime sleepiness, hypersomnolence, bruxism, restless legs, hypnogognic hallucinations, no cataplexy Other comprehensive 14 point system review is negative.   PHYSICAL EXAM:   VS:  BP 130/72 (BP Location: Right Arm, Patient Position: Sitting, Cuff Size: Normal)   Ht 6' 3"  (1.905 m)   Wt 204 lb 9.6 oz (92.8 kg)   SpO2 97%   BMI 25.57 kg/m     Repeat blood pressure by me was 130/70.  Wt Readings from Last 3 Encounters:  02/05/21 204 lb 9.6 oz (92.8 kg)  01/17/21 211 lb (95.7 kg)  12/19/20 211 lb 6.4 oz (95.9 kg)    General: Alert, oriented, no distress.  Skin: normal turgor, no rashes, warm and dry HEENT: Normocephalic, atraumatic. Pupils equal round and reactive to light; sclera anicteric; extraocular muscles intact;  Nose without nasal septal hypertrophy Mouth/Parynx benign; Mallinpatti  scale Neck: No JVD, no carotid bruits; normal carotid upstroke Lungs: clear to ausculatation and percussion; no wheezing or rales Chest wall: without tenderness to palpitation Heart: PMI not displaced, RRR, s1 s2 normal, 1/6 systolic murmur, no diastolic murmur, no rubs, gallops, thrills, or heaves Abdomen: soft, nontender; no hepatosplenomehaly, BS+; abdominal aorta nontender and not dilated by palpation. Back: no CVA tenderness Pulses 2+ right radial cath site stable. Musculoskeletal: full range of motion, normal strength, no joint deformities Extremities: no  clubbing cyanosis or edema, Homan's sign negative  Neurologic: grossly nonfocal; Cranial nerves grossly wnl Psychologic: Normal mood and affect   Studies/Labs Reviewed:   EKG:  EKG is ordered today.  ECG (independently read by me):  Sinus rhythm, 1st degree AV block; PVCs, inferior Q wave and T wave inversion III, aVF, QTc 421 msec  Recent Labs: BMP Latest Ref Rng & Units 01/18/2021 01/17/2021 01/17/2021  Glucose 70 - 99 mg/dL 97 129(H) 142(H)  BUN 8 - 23 mg/dL 20 26(H) 26(H)  Creatinine 0.61 - 1.24 mg/dL 1.15 1.10 1.26(H)  BUN/Creat Ratio 6 - 22 (calc) - - -  Sodium 135 - 145 mmol/L 138 134(L) 137  Potassium 3.5 - 5.1 mmol/L 3.7 3.8 3.8  Chloride 98 - 111 mmol/L 107 100 102  CO2 22 - 32 mmol/L 21(L) - 23  Calcium 8.9 - 10.3 mg/dL 9.3 - 9.8     Hepatic Function Latest Ref Rng & Units 01/17/2021 12/19/2020 03/05/2020  Total Protein 6.5 - 8.1 g/dL 6.3(L) 6.9 6.7  Albumin 3.5 - 5.0 g/dL 4.0 4.5 -  AST 15 - 41 U/L 28 23 24   ALT 0 - 44 U/L 24 20 20   Alk Phosphatase 38 - 126 U/L 38 47 -  Total Bilirubin 0.3 - 1.2 mg/dL 1.6(H) 1.0 1.2  Bilirubin, Direct 0.0 - 0.3 mg/dL - - -    CBC Latest Ref Rng & Units 01/18/2021 01/17/2021 01/17/2021  WBC 4.0 - 10.5 K/uL 9.5 - 11.6(H)  Hemoglobin 13.0 - 17.0 g/dL 16.6 15.0 16.8  Hematocrit 39.0 - 52.0 % 50.3 44.0 48.6  Platelets 150 - 400 K/uL 187 - 214   Lab Results  Component Value Date   MCV  94.7 01/18/2021   MCV 90.0 01/17/2021   MCV 92.6 12/19/2020   Lab Results  Component Value Date   TSH 2.17 03/02/2019   Lab Results  Component Value Date   HGBA1C 5.5 01/17/2021     BNP No results found for: BNP  ProBNP No results found for: PROBNP   Lipid Panel     Component Value Date/Time   CHOL 125 01/17/2021 1208   TRIG 109 01/17/2021 1208   TRIG 95 05/26/2006 0848   HDL 37 (L) 01/17/2021 1208   CHOLHDL 3.4 01/17/2021 1208   VLDL 22 01/17/2021 1208   LDLCALC 66 01/17/2021 1208   LDLCALC 107 (H) 03/05/2020 0839   LDLDIRECT 128.0 03/02/2019 0852     RADIOLOGY: CARDIAC CATHETERIZATION  Result Date: 01/17/2021 Formatting of this result is different from the original.   Mid RCA to Dist RCA lesion is 100% stenosed.   Dist RCA lesion is 30% stenosed.   RPAV lesion is 60% stenosed.   RPDA lesion is 100% stenosed.   Ost RCA to Prox RCA lesion is 80% stenosed.   Prox RCA lesion is 40% stenosed.   A stent was successfully placed.   A stent was successfully placed.   Post intervention, there is a 0% residual stenosis.   Post intervention, there is a 0% residual stenosis.   LV end diastolic pressure is moderately elevated. Acute ST segment elevation inferior wall myocardial infarction secondary to mid-distal thrombotic occlusion of the large dominant RCA. No significant obstructive disease in the left coronary circulation. Very difficult but successful coronary intervention to the RCA with significant catheter backup difficulties, calcified vessel with tortuosity and needing GuideLiner support for ultimate stenting near the acute margin with a 3.5 x 15 mm Resolute Onyx Frontier stent postdilated to 3.8 mm  with the 100% occlusion being reduced to 0% and successful stenting of the very proximal RCA with ultimate insertion of a 3.5 x 18 mm Resolute Onyx Frontier stent postdilated to 3.85 mm with both stenosis being reduced to 0%.  There is diffuse irregularity in the RCA with 40% proximal  to mid calcified stenosis, 30% distal stenosis with 60% stenosis in the PLA vessel and thrombotic occlusion of the distal portion of the PDA vessel for which Aggrenox was initiated. RECOMMENDATION: DAPT for minimum of 12 months but probably longer.  Aggressive lipid-lowering therapy with target LDL less than 70 and preferably in the 50s or below.  Medical therapy for concomitant CAD.   ECHOCARDIOGRAM COMPLETE  Result Date: 01/18/2021    ECHOCARDIOGRAM REPORT   Patient Name:   Bryan [name] Date of Exam: 01/18/2021 Medical Rec #:  [id number]       Height:       75.0 in Accession #:    [id number]      Weight:       211.0 lb Date of Birth:  [birth year]      BSA:          2.245 m Patient Age:    89 years        BP:           129/92 mmHg Patient Gender: M               HR:           60 bpm. Exam Location:  Inpatient Procedure: 2D Echo, Color Doppler and Cardiac Doppler Indications:    Acute MI i21.9  History:        Patient has no prior history of Echocardiogram examinations.                 Risk Factors:Hypertension.  Sonographer:    Raquel Sarna Senior RDCS Referring Phys: [place]  1. Left ventricular ejection fraction, by estimation, is 35 to 40%. The left ventricle has moderately decreased function. The left ventricle demonstrates regional wall motion abnormalities (see scoring diagram/findings for description). The left ventricular internal cavity size was mildly dilated. Left ventricular diastolic parameters are consistent with Grade I diastolic dysfunction (impaired relaxation). There is severe hypokinesis of the left ventricular, mid inferoseptal wall.  2. Right ventricular systolic function is normal. The right ventricular size is normal.  3. Right atrial size was mildly dilated.  4. The mitral valve is normal in structure. No evidence of mitral valve regurgitation.  5. The aortic valve is grossly normal. Aortic valve regurgitation is mild.  6. Aortic dilatation noted. There is moderate  dilatation of the ascending aorta, measuring 45 mm. FINDINGS  Left Ventricle: Left ventricular ejection fraction, by estimation, is 35 to 40%. The left ventricle has moderately decreased function. The left ventricle demonstrates regional wall motion abnormalities. Severe hypokinesis of the left ventricular, mid inferoseptal wall. The left ventricular internal cavity size was mildly dilated. There is no left ventricular hypertrophy. Left ventricular diastolic parameters are consistent with Grade I diastolic dysfunction (impaired relaxation). Right Ventricle: The right ventricular size is normal. Right vetricular wall thickness was not well visualized. Right ventricular systolic function is normal. Left Atrium: Left atrial size was normal in size. Right Atrium: Right atrial size was mildly dilated. Pericardium: There is no evidence of pericardial effusion. Mitral Valve: The mitral valve is normal in structure. No evidence of mitral valve regurgitation. Tricuspid Valve: The tricuspid valve is grossly normal. Tricuspid valve regurgitation  is trivial. Aortic Valve: The aortic valve is grossly normal. Aortic valve regurgitation is mild. Aortic regurgitation PHT measures 559 msec. Pulmonic Valve: The pulmonic valve was normal in structure. Pulmonic valve regurgitation is not visualized. Aorta: Aortic dilatation noted. There is moderate dilatation of the ascending aorta, measuring 45 mm. IAS/Shunts: The atrial septum is grossly normal.  LEFT VENTRICLE PLAX 2D LVIDd:         5.10 cm  Diastology LVIDs:         4.20 cm  LV e' medial:    4.68 cm/s LV PW:         0.90 cm  LV E/e' medial:  8.1 LV IVS:        0.90 cm  LV e' lateral:   8.05 cm/s LVOT diam:     2.90 cm  LV E/e' lateral: 4.7 LV SV:         111 LV SV Index:   49 LVOT Area:     6.61 cm  RIGHT VENTRICLE RV S prime:     17.70 cm/s TAPSE (M-mode): 3.0 cm LEFT ATRIUM           Index       RIGHT ATRIUM           Index LA diam:      3.70 cm 1.65 cm/m  RA Area:     19.60  cm LA Vol (A2C): 26.4 ml 11.76 ml/m RA Volume:   53.60 ml  23.88 ml/m LA Vol (A4C): 22.6 ml 10.07 ml/m  AORTIC VALVE LVOT Vmax:   83.60 cm/s LVOT Vmean:  48.700 cm/s LVOT VTI:    0.168 m AI PHT:      559 msec  AORTA Ao Root diam: 4.50 cm MITRAL VALVE MV Area (PHT): 2.24 cm    SHUNTS MV Decel Time: 339 msec    Systemic VTI:  0.17 m MV E velocity: 37.70 cm/s  Systemic Diam: 2.90 cm MV A velocity: 48.40 cm/s MV E/A ratio:  0.78 Mertie Moores MD Electronically signed by Mertie Moores MD Signature Date/Time: 01/18/2021/3:29:41 PM    Final      Additional studies/ records that were reviewed today include:   EMERGENT CATH/PCI: 01/17/2021   Mid RCA to Dist RCA lesion is 100% stenosed.   Dist RCA lesion is 30% stenosed.   RPAV lesion is 60% stenosed.   RPDA lesion is 100% stenosed.   Ost RCA to Prox RCA lesion is 80% stenosed.   Prox RCA lesion is 40% stenosed.   A stent was successfully placed.   A stent was successfully placed.   Post intervention, there is a 0% residual stenosis.   Post intervention, there is a 0% residual stenosis.   LV end diastolic pressure is moderately elevated.   Acute ST segment elevation inferior wall myocardial infarction secondary to mid-distal thrombotic occlusion of the large dominant RCA.   No significant obstructive disease in the left coronary circulation.   Very difficult but successful coronary intervention to the RCA with significant catheter backup difficulties, calcified vessel with tortuosity and needing GuideLiner support for ultimate stenting near the acute margin with a 3.5 x 15 mm Resolute Onyx Frontier stent postdilated to 3.8 mm with the 100% occlusion being reduced to 0% and successful stenting of the very proximal RCA with ultimate insertion of a 3.5 x 18 mm Resolute Onyx Frontier stent postdilated to 3.85 mm with both stenosis being reduced to 0%.  There is diffuse irregularity in the RCA with 40%  proximal to mid calcified stenosis, 30% distal stenosis  with 60% stenosis in the PLA vessel and thrombotic occlusion of the distal portion of the PDA vessel for which Aggrenox was initiated.   RECOMMENDATION: DAPT for minimum of 12 months but probably longer.  Aggressive lipid-lowering therapy with target LDL less than 70 and preferably in the 50s or below.  Medical therapy for concomitant CAD.       Intervention     ECHO 01/18/2021 IMPRESSIONS   1. Left ventricular ejection fraction, by estimation, is 35 to 40%. The  left ventricle has moderately decreased function. The left ventricle  demonstrates regional wall motion abnormalities (see scoring  diagram/findings for description). The left  ventricular internal cavity size was mildly dilated. Left ventricular  diastolic parameters are consistent with Grade I diastolic dysfunction  (impaired relaxation). There is severe hypokinesis of the left  ventricular, mid inferoseptal wall.   2. Right ventricular systolic function is normal. The right ventricular  size is normal.   3. Right atrial size was mildly dilated.   4. The mitral valve is normal in structure. No evidence of mitral valve  regurgitation.   5. The aortic valve is grossly normal. Aortic valve regurgitation is  mild.   6. Aortic dilatation noted. There is moderate dilatation of the ascending  aorta, measuring 45 mm.  ASSESSMENT:    1. STEMI involving right coronary artery ([place]): 01/17/2021: DES stent to mid and proxinmal RCA   2. Ischemic cardiomyopathy   3. Hyperlipidemia with target LDL less than 70   4. Essential hypertension   5. History of gastroesophageal reflux (GERD)     PLAN:  Bryan [name] is a 75 year old gentleman who has a history of hypertension, hyperlipidemia, spinal stenosis, and GERD.  He has been active for most of his life and has been swimming daily and was playing golf several days per week.  There is no tobacco history or alcohol use.  Recently he had been more aggressive with dietary change  and had not been eating any meat since early July 2022.  He presented with an acute inferior wall STEMI and was found to have mid-distal thrombotic occlusion of a large dominant RCA.  He underwent difficult but successful coronary intervention to his calcified tortuous vessel with ultimate stenting with a 3.5 x 15 mm Medtronic Onyx frontiers stent postdilated to 3.8 mm at the occlusion site with resumption of brisk TIMI-3 flow and due to proximal 80% stenosis underwent stenting with a 3.5 x 18 mm stent postdilated to 3.85 mm.  His initial EF the following day did show reduced LV function with an EF at approximately 35 to 40%.  Of note, he also had moderate dilatation of his ascending aorta at 45 mm.  He had an uneventful postoperative course and was discharged 2 days following his MI.  With his reduced LV function, I have recommended additional titration of losartan to 25 mg twice a day from its present dose of 25 mg daily.  He continues to be on metoprolol succinate 25 mg daily.  I am hopeful that his LV will have further recovery since his initial echo may also have been in the setting of continued stunned myocardium.  For this reason I have recommended a follow-up echo Doppler study in approximately 2 months.  If LV function remains depressed at that time I will transition him to [facility].  He may also benefit from initiation of aldosterone blockade or SGLT2 inhibition.  He is now on  rosuvastatin 40 mg.  LDL cholesterol on August 4 was 66.  He has not had any anginal symptoms or palpitations.  His ECG shows sinus rhythm with first-degree heart block with inferior Q waves and T wave inversion.  I will see him in 2 months in follow-up of his echo Doppler study and further recommendations will be made at that time.   Medication Adjustments/Labs and Tests Ordered: Current medicines are reviewed at length with the patient today.  Concerns regarding medicines are outlined above.  Medication changes, Labs and Tests  ordered today are listed in the Patient Instructions below. Patient Instructions  Medication Instructions:  INCREASE LOSARTAN TO 45m TWICE DAILY  *If you need a refill on your cardiac medications before your next appointment, please call your pharmacy*  Testing/Procedures: Your physician has requested that you have an echocardiogram in 6-8 WEEKS. Echocardiography is a painless test that uses sound waves to create images of your heart. It provides your doctor with information about the size and shape of your heart and how well your heart's chambers and valves are working. You may receive an ultrasound enhancing agent through an IV if needed to better visualize your heart during the echo.This procedure takes approximately one hour. There are no restrictions for this procedure. This will take place at the [address].   Follow-Up: [facility] our [facility].  As part of our continuing mission to provide you with exceptional heart care, we have created designated Provider Care Teams.  These Care Teams include your primary Cardiologist (physician) and Advanced Practice Providers (APPs -  Physician Assistants and Nurse Practitioners) who all work together to provide you with the care you need, when you need it.  We recommend signing up for the patient portal called "MyChart".  Sign up information is provided on this After Visit Summary.  MyChart is used to connect with patients for Virtual Visits (Telemedicine).  Patients are able to view lab/test results, encounter notes, upcoming appointments, etc.  Non-urgent messages can be sent to your provider as well.   To learn more about what you can do with MyChart, go to [email or web address]    Your next appointment:   8 week(s)  The format for your next appointment:   In Person  Provider:   TShelva Majestic MD   Signed, TShelva Majestic MD  02/07/2021 7:36 PM    [place] [place]  [id number]: [phone]

## 2021-02-05 NOTE — Patient Instructions (Signed)
Medication Instructions:  INCREASE LOSARTAN TO '25mg'$  TWICE DAILY  *If you need a refill on your cardiac medications before your next appointment, please call your pharmacy*  Testing/Procedures: Your physician has requested that you have an echocardiogram in 6-8 WEEKS. Echocardiography is a painless test that uses sound waves to create images of your heart. It provides your doctor with information about the size and shape of your heart and how well your heart's chambers and valves are working. You may receive an ultrasound enhancing agent through an IV if needed to better visualize your heart during the echo.This procedure takes approximately one hour. There are no restrictions for this procedure. This will take place at the [address].   Follow-Up: At [facility], you and your health needs are our priority.  As part of our continuing mission to provide you with exceptional heart care, we have created designated Provider Care Teams.  These Care Teams include your primary Cardiologist (physician) and Advanced Practice Providers (APPs -  Physician Assistants and Nurse Practitioners) who all work together to provide you with the care you need, when you need it.  We recommend signing up for the patient portal called "MyChart".  Sign up information is provided on this After Visit Summary.  MyChart is used to connect with patients for Virtual Visits (Telemedicine).  Patients are able to view lab/test results, encounter notes, upcoming appointments, etc.  Non-urgent messages can be sent to your provider as well.   To learn more about what you can do with MyChart, go to [email or web address].    Your next appointment:   8 week(s)  The format for your next appointment:   In Person  Provider:   Shelva Majestic, MD

## 2021-02-07 ENCOUNTER — Encounter: Payer: Self-pay | Admitting: Cardiovascular Disease

## 2021-02-15 ENCOUNTER — Telehealth: Payer: Self-pay

## 2021-02-15 NOTE — Telephone Encounter (Signed)
**Note De-Identified Fey Coghill Obfuscation** Glean Salvo Key: [id number] - PA Case ID: [id number] Outcome: This medication or product is on your plan's list of covered drugs. Prior authorization is not required at this time. . Drug: Brilinta '90MG'$  tablets Form: [employer] Medicare Part D Electronic Prior Authorization Form (2017 NCPDP)  I have notified [facility] of this approval.

## 2021-02-15 NOTE — Telephone Encounter (Signed)
Received notification that patient's Brilinta 90 mg has been rejected by insurance and requires PA.   Key: [facility] Last name: Lenon DOB: September 03, 1945

## 2021-02-19 ENCOUNTER — Other Ambulatory Visit: Payer: Self-pay | Admitting: Family Medicine

## 2021-02-19 DIAGNOSIS — M48062 Spinal stenosis, lumbar region with neurogenic claudication: Secondary | ICD-10-CM

## 2021-02-26 ENCOUNTER — Telehealth: Payer: Self-pay | Admitting: Family Medicine

## 2021-02-26 NOTE — Telephone Encounter (Signed)
Left message for patient to call back and schedule Medicare Annual Wellness Visit (AWV) either virtually or in office. Left  my Herbie Drape number [phone]   Last AWV ;01/09/20 please schedule at anytime with [facility] Nurse Health Advisor 1 or 2   This should be a 45 minute visit.

## 2021-03-11 ENCOUNTER — Telehealth: Payer: Medicare Other

## 2021-03-11 ENCOUNTER — Telehealth: Payer: Self-pay | Admitting: Pharmacist

## 2021-03-11 NOTE — Chronic Care Management (AMB) (Signed)
    Chronic Care Management Pharmacy Assistant   Name: Bryan [name]  MRN: [id number] DOB: 29-Sep-1945  03/12/2021 APPOINTMENT REMINDER   Called [name], No answer, left message of appointment on 03/12/2021 at 4:00 via telephone visit with Jeni Salles, Pharm D. Notified to have all medications, supplements, blood pressure and/or blood sugar logs available during appointment and to return call if need to reschedule.   Care Gaps:  AWV - scheduled for 03/13/2021 Hepatitis C Screening - never done Tetanus/TDAP - overdue 07/20/2019 Covid 19 vaccine booster 4 - overdue 08/01/2020 Flu vaccine - 01/14/2021  Star Rating Drug:  Losartan 25mg  - last filled 01/19/2021 30DS at [facility] Rosuvastatin 40mg  - last filled 01/19/2021 30DS at [place] [phone]

## 2021-03-12 ENCOUNTER — Telehealth (HOSPITAL_COMMUNITY): Payer: Self-pay

## 2021-03-12 ENCOUNTER — Encounter (HOSPITAL_COMMUNITY): Payer: Self-pay

## 2021-03-12 ENCOUNTER — Ambulatory Visit (INDEPENDENT_AMBULATORY_CARE_PROVIDER_SITE_OTHER): Payer: Medicare Other | Admitting: Pharmacist

## 2021-03-12 DIAGNOSIS — I10 Essential (primary) hypertension: Secondary | ICD-10-CM

## 2021-03-12 DIAGNOSIS — E785 Hyperlipidemia, unspecified: Secondary | ICD-10-CM

## 2021-03-12 NOTE — Progress Notes (Signed)
Chronic Care Management Pharmacy Note  03/12/2021 Name:  Bryan [name] MRN:  [id number] DOB:  [birth year]  Summary: BP is at goal < 130/80 per office readings Pt does not check BP at home LDL is at goal < 70   Recommendations/Changes made from today's visit: -Recommended purchasing BP arm cuff to monitor at home -Recommended bringing cuff to next office visit to compare to manual readings   Plan: Follow up BP assessment in 1-2 months  Subjective: Bryan [name] is an 75 y.o. year old male who is a primary patient of Koberlein, Steele Berg, MD.  The CCM team was consulted for assistance with disease management and care coordination needs.    Engaged with patient by telephone for follow up visit in response to provider referral for pharmacy case management and/or care coordination services.   Consent to Services:  The patient was given information about Chronic Care Management services, agreed to services, and gave verbal consent prior to initiation of services.  Please see initial visit note for detailed documentation.   Patient Care Team: Caren Macadam, MD as PCP - General (Family Medicine) Troy Sine, MD as PCP - Cardiology (Cardiology) Festus Aloe, MD as Consulting Physician (Urology) Danella Sensing, MD as Consulting Physician (Dermatology) Michael Boston, MD as Consulting Physician (General Surgery) Paralee Cancel, MD as Consulting Physician (Orthopedic Surgery) Jovita Gamma, MD as Consulting Physician (Neurosurgery) Viona Gilmore, [facility] as Pharmacist (Pharmacist)  Recent office visits: 12/19/20  Micheline Rough, MD: Patient presented for annual exam. Prescribed Crestor 5 mg daily.   Recent consult visits: 02/05/21 Shelva Majestic, MD (cardiology): Patient presented for initial visit for STEMI. Increased losartan to 25 mg BID. Ordered echo for 6-8 weeks.  Hospital visits: 01/17/65-01/19/21 Patient admitted to [facility] for STEMI. Started on  DAPT therapy. Switched to Toprol XL and added low dose Cozaar and stopped lisinopril/HCTZ.  Objective:  Lab Results  Component Value Date   CREATININE 1.15 01/18/2021   BUN 20 01/18/2021   GFR 52.44 (L) 12/19/2020   GFRNONAA >60 01/18/2021   GFRAA >60 02/10/2018   NA 138 01/18/2021   K 3.7 01/18/2021   CALCIUM 9.3 01/18/2021   CO2 21 (L) 01/18/2021   GLUCOSE 97 01/18/2021    Lab Results  Component Value Date/Time   HGBA1C 5.5 01/17/2021 12:08 PM   HGBA1C 5.4 03/05/2020 08:39 AM   GFR 52.44 (L) 12/19/2020 09:50 AM   GFR 60.81 08/31/2019 08:45 AM    Last diabetic Eye exam: No results found for: HMDIABEYEEXA  Last diabetic Foot exam: No results found for: HMDIABFOOTEX   Lab Results  Component Value Date   CHOL 125 01/17/2021   HDL 37 (L) 01/17/2021   LDLCALC 66 01/17/2021   LDLDIRECT 128.0 03/02/2019   TRIG 109 01/17/2021   CHOLHDL 3.4 01/17/2021    Hepatic Function Latest Ref Rng & Units 01/17/2021 12/19/2020 03/05/2020  Total Protein 6.5 - 8.1 g/dL 6.3(L) 6.9 6.7  Albumin 3.5 - 5.0 g/dL 4.0 4.5 -  AST 15 - 41 U/L 28 23 24   ALT 0 - 44 U/L 24 20 20   Alk Phosphatase 38 - 126 U/L 38 47 -  Total Bilirubin 0.3 - 1.2 mg/dL 1.6(H) 1.0 1.2  Bilirubin, Direct 0.0 - 0.3 mg/dL - - -    Lab Results  Component Value Date/Time   TSH 2.17 03/02/2019 08:52 AM   TSH 1.69 01/25/2018 10:04 AM    CBC Latest Ref Rng & Units 01/18/2021 01/17/2021 01/17/2021  WBC 4.0 - 10.5 K/uL 9.5 - 11.6(H)  Hemoglobin 13.0 - 17.0 g/dL 16.6 15.0 16.8  Hematocrit 39.0 - 52.0 % 50.3 44.0 48.6  Platelets 150 - 400 K/uL 187 - 214    Lab Results  Component Value Date/Time   VD25OH 44.89 10/12/2019 09:11 AM   VD25OH 41.57 03/02/2019 08:52 AM    Clinical ASCVD: No  The ASCVD Risk score (Arnett DK, et al., 2019) failed to calculate for the following reasons:   The patient has a prior MI or stroke diagnosis    Depression screen [facility] 2/9 12/19/2020 03/05/2020 01/09/2020  Decreased Interest 0 0 0  Down,  Depressed, Hopeless 0 0 0  PHQ - 2 Score 0 0 0  Altered sleeping 0 - 0  Tired, decreased energy 0 - 0  Change in appetite 0 - 0  Feeling bad or failure about yourself  1 - 0  Trouble concentrating 0 - 0  Moving slowly or fidgety/restless 0 - 0  Suicidal thoughts 0 - 0  PHQ-9 Score 1 - 0  Difficult doing work/chores Not difficult at all - Not difficult at all      Social History   Tobacco Use  Smoking Status Never  Smokeless Tobacco Never   BP Readings from Last 3 Encounters:  02/05/21 130/72  01/19/21 123/84  12/19/20 108/60   Pulse Readings from Last 3 Encounters:  01/19/21 62  12/19/20 86  06/06/20 66   Wt Readings from Last 3 Encounters:  02/05/21 204 lb 9.6 oz (92.8 kg)  01/17/21 211 lb (95.7 kg)  12/19/20 211 lb 6.4 oz (95.9 kg)   BMI Readings from Last 3 Encounters:  02/05/21 25.57 kg/m  01/17/21 26.37 kg/m  12/19/20 26.42 kg/m    Assessment/Interventions: Review of patient past medical history, allergies, medications, health status, including review of consultants reports, laboratory and other test data, was performed as part of comprehensive evaluation and provision of chronic care management services.   SDOH:  (Social Determinants of Health) assessments and interventions performed: No   SDOH Screenings   Alcohol Screen: Not on file  Depression (PHQ2-9): Low Risk    PHQ-2 Score: 1  Financial Resource Strain: Low Risk    Difficulty of Paying Living Expenses: Not hard at all  Food Insecurity: Not on file  Housing: Not on file  Physical Activity: Not on file  Social Connections: Not on file  Stress: Not on file  Tobacco Use: Low Risk    Smoking Tobacco Use: Never   Smokeless Tobacco Use: Never  Transportation Needs: No Transportation Needs   Lack of Transportation (Medical): No   Lack of Transportation (Non-Medical): No      CCM Care Plan  Allergies  Allergen Reactions   Codeine Other (See Comments)    "made me sick"    Medications  Reviewed Today     Reviewed by Troy Sine, MD (Physician) on 02/07/21 at [place] List Status: <None>   Medication Order Taking? Sig Documenting Provider Last Dose Status Informant  acetaminophen (TYLENOL) 500 MG tablet [id number] No Take 500 mg by mouth daily as needed for moderate pain. [provider] 01/17/2021 Active Multiple Informants  aspirin 81 MG chewable tablet [id number]  Chew 1 tablet (81 mg total) by mouth daily. Barrett, Evelene Croon, PA-C  Active   calcium carbonate (TUMS - DOSED IN MG ELEMENTAL CALCIUM) 500 MG chewable tablet [id number] No Chew 2 tablets by mouth daily as needed for indigestion or heartburn. [provider] 01/17/2021  Active Multiple Informants  Cholecalciferol (VITAMIN D3) 2000 units TABS [id number] No Take 2,000 Units by mouth daily. [provider] 01/16/2021 Active Multiple Informants  gabapentin (NEURONTIN) 100 MG capsule [id number] No TAKE TWO CAPSULES AT BEDTIME.  Patient taking differently: Take 200 mg by mouth at bedtime.   Caren Macadam, MD 01/16/2021 Active Multiple Informants  losartan (COZAAR) 25 MG tablet [id number]  Take 1 tablet (25 mg total) by mouth 2 (two) times daily. Troy Sine, MD  Active   metoprolol succinate (TOPROL-XL) 25 MG 24 hr tablet [id number]  Take 1 tablet (25 mg total) by mouth daily. Barrett, Evelene Croon, PA-C  Active   Multiple Vitamins-Minerals (HAIR SKIN AND NAILS FORMULA PO) [id number] No Take 1 tablet by mouth daily. [provider] 01/16/2021 Active Multiple Informants  nitroGLYCERIN (NITROSTAT) 0.4 MG SL tablet [id number]  Place 1 tablet (0.4 mg total) under the tongue every 5 (five) minutes as needed for chest pain. Barrett, Evelene Croon, PA-C  Active   Omega-3 Fatty Acids (FISH OIL) 1200 MG CAPS [id number]  Take 1 capsule (1,200 mg total) by mouth daily. Barrett, Evelene Croon, PA-C  Active   rosuvastatin (CRESTOR) 40 MG tablet [id number]  Take 1 tablet (40 mg total) by mouth daily. Barrett, Evelene Croon, PA-C   Active   ticagrelor (BRILINTA) 90 MG TABS tablet [id number]  Take 1 tablet (90 mg total) by mouth 2 (two) times daily. Barrett, Evelene Croon, PA-C  Active   tiZANidine (ZANAFLEX) 4 MG tablet [id number] No TAKE 1 TABLET THREE TIMES DAILY AS NEEDED FOR MUSCLE SPASMS.  Patient taking differently: Take 4 mg by mouth every 8 (eight) hours as needed for muscle spasms.   Caren Macadam, MD 01/14/2021 Active Multiple Informants  traMADol (ULTRAM) 50 MG tablet [id number] No Take 1 tablet (50 mg total) by mouth every 6 (six) hours as needed for moderate pain. Caren Macadam, MD 01/17/2021 Active Multiple Informants  vitamin B-12 (CYANOCOBALAMIN) 1000 MCG tablet [id number] No Take 1,000 mcg by mouth daily. [provider] 01/16/2021 Active Multiple Informants  vitamin C (ASCORBIC ACID) 250 MG tablet [id number] No Take 250 mg by mouth daily. [provider] 01/16/2021 Active Multiple Informants            Patient Active Problem List   Diagnosis Date Noted   STEMI involving right coronary artery ([place]) 01/17/2021   ST elevation myocardial infarction involving right coronary artery ([place])    Right groin pain 09/07/2019   S/P right THA, AA 02/09/2018   S/P hip replacement 02/09/2018   Lumbar stenosis with neurogenic claudication 11/11/2017   PROSTATE SPECIFIC ANTIGEN, ELEVATED 07/19/2009   BACK PAIN 06/11/2007   Essential hypertension 05/21/2007   Osteoarthritis 05/21/2007    Immunization History  Administered Date(s) Administered   Fluad Quad(high Dose 65+) 03/02/2019, 03/05/2020   Influenza Split 03/30/2013   Influenza, High Dose Seasonal PF 03/26/2015, 03/25/2016, 04/13/2017, 03/23/2018   Influenza,inj,Quad PF,6+ Mos 03/21/2014   PFIZER(Purple Top)SARS-COV-2 Vaccination 07/10/2019, 08/01/2019, 03/31/2020   Pneumococcal Conjugate-13 03/21/2014   Pneumococcal Polysaccharide-23 03/26/2015   Td 07/19/2009   Patient reports he is doing much better and going back to the gym and in the  pool. He walks in the pool a lot as well uses water weights. He can swim on his back better because his head is out of the water. Patient reports he has improved his diet now as well and stopped eating regular ice cream and is having diary free with almond milk in it.  Conditions to be addressed/monitored:  Hypertension, Hyperlipidemia, Osteoarthritis, and History of STEMI  Conditions addressed this visit: Hypertension, history of STEMI, hyperlipidemia  Care Plan : CCM Pharmacy Care  Plan  Updates made by Viona Gilmore, [place] since 03/12/2021 12:00 AM     Problem: Problem: hypertension, hyperlipidemia, history of STEMI, osteoarthritis, pain      Long-Range Goal: Patient-Specific Goal   Start Date: 09/11/2020  Expected End Date: 09/11/2021  Recent Progress: On track  Priority: High  Note:   Current Barriers:  Unable to independently monitor therapeutic efficacy  Pharmacist Clinical Goal(s):  Patient will achieve adherence to monitoring guidelines and medication adherence to achieve therapeutic efficacy through collaboration with PharmD and provider.    Interventions: 1:1 collaboration with Caren Macadam, MD regarding development and update of comprehensive plan of care as evidenced by provider attestation and co-signature Inter-disciplinary care team collaboration (see longitudinal plan of care) Comprehensive medication review performed; medication list updated in electronic medical record  Hypertension (BP goal <130/80) -Controlled -Current treatment: Losartan 25 mg 1 tablet twice daily Metoprolol succinate 25 mg 1 tablet daily -Medications previously tried: lisinopril-HCTZ (switched to losartan) -Current home readings: does not check at home; needs to purchase a new machine -Current dietary habits: does not use salt at all except for watermelon and cantaloupe  -Current exercise habits: swimming and at the gym daily -Denies hypotensive/hypertensive symptoms -Educated  on Exercise goal of 150 minutes per week; Importance of home blood pressure monitoring; Proper BP monitoring technique; -Counseled to monitor BP at home weekly, document, and provide log at future appointments -Counseled on diet and exercise extensively Recommended to continue current medication Recommended purchasing an [employer] BP arm cuff to monitor BP at home.  Osteoarthritis/pain (Goal: minimize pain) -Not ideally controlled -Current treatment  Tramadol 50 mg 1 tablet every 6 hours as needed for pain (2 per day) Tizanidine 4 mg 1 tablet three times daily as needed (sometimes doesn't take it every day - seldom using) Gabapentin 100 mg 2 capsules at bedtime Acetaminophen 650 mg 1 tablet as needed for pain (2 times a day) -Medications previously tried: meloxicam (stopped due to risk for bleeding) -Counseled on risk for bleeding with meloxicam.  Hyperlipidemia: (LDL goal < 70) -Controlled -Current treatment: Rosuvastatin 40 mg 1 tablet daily Fish oil 1200 mg 1 capsule daily -Medications previously tried: none  -Current dietary patterns: limiting fried foods and eating lots of fiber in diet -Current exercise habits: swimming and walking in the pool -Educated on Cholesterol goals;  Benefits of statin for ASCVD risk reduction; Importance of limiting foods high in cholesterol; -Counseled on diet and exercise extensively Recommended to continue current medication  History of NSTEMI (Goal: prevention of heart events) -Controlled -Current treatment  Brilinta 90 mg 1 tablet twice daily Aspirin 81 mg 1 tablet daily Rosuvastatin 40 mg 1 tablet daily -Medications previously tried: none  -Recommended to continue current medication Assessed patient finances. Patient would not qualify for Brilinta patient assistance.   Health Maintenance -Vaccine gaps: shinges -Current therapy:  Multivitamin 1 capsule daily Hair skin and nails vitamin 1 tablet daily Vitamin C 250 mg 1 tablet  daily Vitamin B complex 1 tablet daily Vitamin D 2000 units 1 tablet daily Ginger 1 tablet daily Alfalfa 1200 mg daily Magnesium 400 mg daily Black elderberry daily -Educated on Herbal supplement research is limited and benefits usually cannot be proven Cost vs benefit of each product must be carefully weighed by individual consumer -Patient is satisfied with current therapy and  denies issues -Recommended to continue current medication  Patient Goals/Self-Care Activities Patient will:  - take medications as prescribed check blood pressure weekly, document, and provide at future appointments target a minimum of 150 minutes of moderate intensity exercise weekly  Follow Up Plan: Telephone follow up appointment with care management team member scheduled for: 6 months      Medication Assistance: None required.  Patient affirms current coverage meets needs.  Compliance/Adherence/Medication fill history: Care Gaps: Tetanus, influenza, COVID booster, Hep C screening   Star-Rating Drugs: Losartan 62m - [facility]:  [id number]: [phone]: [phone] pill box? No - doesn't need one Pt endorses 100% compliance  We discussed: Current pharmacy is preferred with insurance plan and patient is satisfied with pharmacy services Patient decided to: Continue current medication management strategy  Care Plan and Follow Up Patient Decision:  Patient agrees to Care Plan and Follow-up.  Plan: Telephone follow up appointment with care management team member scheduled for:  6 months  MaJeni SallesPharmD BCOoliticharmacist [place] [phone]

## 2021-03-12 NOTE — Telephone Encounter (Signed)
Attempted to call patient in regards to Cardiac Rehab - LM on VM Mailed letter 

## 2021-03-13 ENCOUNTER — Ambulatory Visit (INDEPENDENT_AMBULATORY_CARE_PROVIDER_SITE_OTHER): Payer: Medicare Other

## 2021-03-13 ENCOUNTER — Other Ambulatory Visit: Payer: Self-pay

## 2021-03-13 DIAGNOSIS — Z0000 Encounter for general adult medical examination without abnormal findings: Secondary | ICD-10-CM | POA: Diagnosis not present

## 2021-03-13 NOTE — Patient Instructions (Signed)
Bryan [name] , Thank you for taking time to come for your Medicare Wellness Visit. I appreciate your ongoing commitment to your health goals. Please review the following plan we discussed and let me know if I can assist you in the future.   Screening recommendations/referrals: Colonoscopy: Done 10/25/12 repeat every 10 years due 10/26/22 Recommended yearly ophthalmology/optometry visit for glaucoma screening and checkup Recommended yearly dental visit for hygiene and checkup  Vaccinations: Influenza vaccine: Due and discussed Pneumococcal vaccine: Completed  Tdap vaccine: Done 05/24/20 repeat every 10 years  Shingles vaccine: Completed 4/1 & 02/15/21    Covid-19: Completed 1/24, 2/15, & 03/31/20  Advanced directives: Advance directive discussed with you today. Even though you declined this today please call our office should you change your mind and we can give you the proper paperwork for you to fill out.  Conditions/risks identified: Stay healthy  Next appointment: Follow up in one year for your annual wellness visit.   Preventive Care 73 Years and Older, Male Preventive care refers to lifestyle choices and visits with your health care provider that can promote health and wellness. What does preventive care include? A yearly physical exam. This is also called an annual well check. Dental exams once or twice a year. Routine eye exams. Ask your health care provider how often you should have your eyes checked. Personal lifestyle choices, including: Daily care of your teeth and gums. Regular physical activity. Eating a healthy diet. Avoiding tobacco and drug use. Limiting alcohol use. Practicing safe sex. Taking low doses of aspirin every day. Taking vitamin and mineral supplements as recommended by your health care provider. What happens during an annual well check? The services and screenings done by your health care provider during your annual well check will depend on your age,  overall health, lifestyle risk factors, and family history of disease. Counseling  Your health care provider may ask you questions about your: Alcohol use. Tobacco use. Drug use. Emotional well-being. Home and relationship well-being. Sexual activity. Eating habits. History of falls. Memory and ability to understand (cognition). Work and work Statistician. Screening  You may have the following tests or measurements: Height, weight, and BMI. Blood pressure. Lipid and cholesterol levels. These may be checked every 5 years, or more frequently if you are over 87 years old. Skin check. Lung cancer screening. You may have this screening every year starting at age 43 if you have a 30-pack-year history of smoking and currently smoke or have quit within the past 15 years. Fecal occult blood test (FOBT) of the stool. You may have this test every year starting at age 34. Flexible sigmoidoscopy or colonoscopy. You may have a sigmoidoscopy every 5 years or a colonoscopy every 10 years starting at age 53. Prostate cancer screening. Recommendations will vary depending on your family history and other risks. Hepatitis C blood test. Hepatitis B blood test. Sexually transmitted disease (STD) testing. Diabetes screening. This is done by checking your blood sugar (glucose) after you have not eaten for a while (fasting). You may have this done every 1-3 years. Abdominal aortic aneurysm (AAA) screening. You may need this if you are a current or former smoker. Osteoporosis. You may be screened starting at age 51 if you are at high risk. Talk with your health care provider about your test results, treatment options, and if necessary, the need for more tests. Vaccines  Your health care provider may recommend certain vaccines, such as: Influenza vaccine. This is recommended every year. Tetanus, diphtheria, and  acellular pertussis (Tdap, Td) vaccine. You may need a Td booster every 10 years. Zoster vaccine.  You may need this after age 70. Pneumococcal 13-valent conjugate (PCV13) vaccine. One dose is recommended after age 12. Pneumococcal polysaccharide (PPSV23) vaccine. One dose is recommended after age 23. Talk to your health care provider about which screenings and vaccines you need and how often you need them. This information is not intended to replace advice given to you by your health care provider. Make sure you discuss any questions you have with your health care provider. Document Released: 06/29/2015 Document Revised: 02/20/2016 Document Reviewed: 04/03/2015 Elsevier Interactive Patient Education  2017 [employer] Prevention in the Home Falls can cause injuries. They can happen to people of all ages. There are many things you can do to make your home safe and to help prevent falls. What can I do on the outside of my home? Regularly fix the edges of walkways and driveways and fix any cracks. Remove anything that might make you trip as you walk through a door, such as a raised step or threshold. Trim any bushes or trees on the path to your home. Use bright outdoor lighting. Clear any walking paths of anything that might make someone trip, such as rocks or tools. Regularly check to see if handrails are loose or broken. Make sure that both sides of any steps have handrails. Any raised decks and porches should have guardrails on the edges. Have any leaves, snow, or ice cleared regularly. Use sand or salt on walking paths during winter. Clean up any spills in your garage right away. This includes oil or grease spills. What can I do in the bathroom? Use night lights. Install grab bars by the toilet and in the tub and shower. Do not use towel bars as grab bars. Use non-skid mats or decals in the tub or shower. If you need to sit down in the shower, use a plastic, non-slip stool. Keep the floor dry. Clean up any water that spills on the floor as soon as it happens. Remove soap  buildup in the tub or shower regularly. Attach bath mats securely with double-sided non-slip rug tape. Do not have throw rugs and other things on the floor that can make you trip. What can I do in the bedroom? Use night lights. Make sure that you have a light by your bed that is easy to reach. Do not use any sheets or blankets that are too big for your bed. They should not hang down onto the floor. Have a firm chair that has side arms. You can use this for support while you get dressed. Do not have throw rugs and other things on the floor that can make you trip. What can I do in the kitchen? Clean up any spills right away. Avoid walking on wet floors. Keep items that you use a lot in easy-to-reach places. If you need to reach something above you, use a strong step stool that has a grab bar. Keep electrical cords out of the way. Do not use floor polish or wax that makes floors slippery. If you must use wax, use non-skid floor wax. Do not have throw rugs and other things on the floor that can make you trip. What can I do with my stairs? Do not leave any items on the stairs. Make sure that there are handrails on both sides of the stairs and use them. Fix handrails that are broken or loose. Make sure that  handrails are as long as the stairways. Check any carpeting to make sure that it is firmly attached to the stairs. Fix any carpet that is loose or worn. Avoid having throw rugs at the top or bottom of the stairs. If you do have throw rugs, attach them to the floor with carpet tape. Make sure that you have a light switch at the top of the stairs and the bottom of the stairs. If you do not have them, ask someone to add them for you. What else can I do to help prevent falls? Wear shoes that: Do not have high heels. Have rubber bottoms. Are comfortable and fit you well. Are closed at the toe. Do not wear sandals. If you use a stepladder: Make sure that it is fully opened. Do not climb a closed  stepladder. Make sure that both sides of the stepladder are locked into place. Ask someone to hold it for you, if possible. Clearly mark and make sure that you can see: Any grab bars or handrails. First and last steps. Where the edge of each step is. Use tools that help you move around (mobility aids) if they are needed. These include: Canes. Walkers. Scooters. Crutches. Turn on the lights when you go into a dark area. Replace any light bulbs as soon as they burn out. Set up your furniture so you have a clear path. Avoid moving your furniture around. If any of your floors are uneven, fix them. If there are any pets around you, be aware of where they are. Review your medicines with your doctor. Some medicines can make you feel dizzy. This can increase your chance of falling. Ask your doctor what other things that you can do to help prevent falls. This information is not intended to replace advice given to you by your health care provider. Make sure you discuss any questions you have with your health care provider. Document Released: 03/29/2009 Document Revised: 11/08/2015 Document Reviewed: 07/07/2014 Elsevier Interactive Patient Education  2017 [employer].

## 2021-03-13 NOTE — Progress Notes (Addendum)
Virtual Visit via Telephone Note  I connected with  Bryan [name] on 03/13/21 at  3:15 PM EDT by telephone and verified that I am speaking with the correct person using two identifiers.  Medicare Annual Wellness visit completed telephonically due to Covid-19 pandemic.   Persons participating in this call: This Health Coach and this patient.   Location: Patient: home Provider: office   I discussed the limitations, risks, security and privacy concerns of performing an evaluation and management service by telephone and the availability of in person appointments. The patient expressed understanding and agreed to proceed.  Unable to perform video visit due to video visit attempted and failed and/or patient does not have video capability.   Some vital signs may be absent or patient reported.   Willette Brace, LPN   Subjective:   Bryan [name] is a 75 y.o. male who presents for Medicare Annual/Subsequent preventive examination.  Review of Systems     Cardiac Risk Factors include: advanced age (>65men, >1 women);hypertension;male gender     Objective:    There were no vitals filed for this visit. There is no height or weight on file to calculate BMI.  Advanced Directives 03/13/2021 01/09/2020 05/21/2018 02/09/2018 02/02/2018 11/30/2017 11/10/2017  Does Patient Have a Medical Advance Directive? No No No No No No No  Does patient want to make changes to medical advance directive? No - Patient declined - - - - - -  Would patient like information on creating a medical advance directive? - No - Patient declined No - Patient declined No - Patient declined No - Patient declined No - Patient declined No - Patient declined    Current Medications (verified) Outpatient Encounter Medications as of 03/13/2021  Medication Sig   acetaminophen (TYLENOL) 500 MG tablet Take 500 mg by mouth daily as needed for moderate pain.   aspirin 81 MG chewable tablet Chew 1 tablet (81 mg total) by mouth daily.    calcium carbonate (TUMS - DOSED IN MG ELEMENTAL CALCIUM) 500 MG chewable tablet Chew 2 tablets by mouth daily as needed for indigestion or heartburn.   Cholecalciferol (VITAMIN D3) 2000 units TABS Take 2,000 Units by mouth daily.   gabapentin (NEURONTIN) 100 MG capsule TAKE TWO CAPSULES AT BEDTIME. (Patient taking differently: Take 200 mg by mouth at bedtime.)   losartan (COZAAR) 25 MG tablet Take 1 tablet (25 mg total) by mouth 2 (two) times daily.   metoprolol succinate (TOPROL-XL) 25 MG 24 hr tablet Take 1 tablet (25 mg total) by mouth daily.   Multiple Vitamins-Minerals (HAIR SKIN AND NAILS FORMULA PO) Take 1 tablet by mouth daily.   nitroGLYCERIN (NITROSTAT) 0.4 MG SL tablet Place 1 tablet (0.4 mg total) under the tongue every 5 (five) minutes as needed for chest pain.   Omega-3 Fatty Acids (FISH OIL) 1200 MG CAPS Take 1 capsule (1,200 mg total) by mouth daily.   rosuvastatin (CRESTOR) 40 MG tablet Take 1 tablet (40 mg total) by mouth daily.   ticagrelor (BRILINTA) 90 MG TABS tablet Take 1 tablet (90 mg total) by mouth 2 (two) times daily.   tiZANidine (ZANAFLEX) 4 MG tablet Take 1 tablet (4 mg total) by mouth every 8 (eight) hours as needed for muscle spasms.   traMADol (ULTRAM) 50 MG tablet Take 1 tablet (50 mg total) by mouth every 6 (six) hours as needed for moderate pain.   vitamin B-12 (CYANOCOBALAMIN) 1000 MCG tablet Take 1,000 mcg by mouth daily.   vitamin C (ASCORBIC  ACID) 250 MG tablet Take 250 mg by mouth daily.   [DISCONTINUED] amoxicillin (AMOXIL) 500 MG capsule amoxicillin 500 mg capsule  4 po 1 hr prior to dental procedure (Patient not taking: Reported on 03/13/2021)   No facility-administered encounter medications on file as of 03/13/2021.    Allergies (verified) Codeine   History: Past Medical History:  Diagnosis Date   Anxiety    Cancer ([place])    facial skin cancer   Depression    GERD (gastroesophageal reflux disease)    takes tums rarely   Hematuria    1 time  in july 2019   Hypertension    Osteoarthritis    Spinal stenosis    ST elevation myocardial infarction involving right coronary artery [facility])    Past Surgical History:  Procedure Laterality Date   ACHILLES TENDON SURGERY  02/2006   ANTERIOR LAT LUMBAR FUSION N/A 11/11/2017   Procedure: LUMBAR TWO- LUMBAR THREE, LUMBAR THREE- LUMBAR FOUR, LUMBAR FOUR- LUMBAR FIVE ANTERIOLATERAL INTERBODY ARTHRODESIS;  Surgeon: Jovita Gamma, MD;  Location: [facility];  Service: Neurosurgery;  Laterality: N/A;   COLONOSCOPY  2003   COLONOSCOPY     CORONARY STENT INTERVENTION N/A 01/17/2021   Procedure: CORONARY STENT INTERVENTION;  Surgeon: Troy Sine, MD;  Location: [place] [facility];  Service: Cardiovascular;  Laterality: N/A;   CORONARY/GRAFT ACUTE MI REVASCULARIZATION N/A 01/17/2021   Procedure: Coronary/Graft Acute MI Revascularization;  Surgeon: Troy Sine, MD;  Location: [place] [facility];  Service: Cardiovascular;  Laterality: N/A;   CYSTOSCOPY N/A 05/21/2018   Procedure: CYSTOSCOPY FLEXIBLE;  Surgeon: Festus Aloe, MD;  Location: [facility];  Service: Urology;  Laterality: N/A;   KNEE SURGERY Left 1991 and 2001   X2 arthroscopy   LEFT HEART CATH AND CORONARY ANGIOGRAPHY N/A 01/17/2021   Procedure: LEFT HEART CATH AND CORONARY ANGIOGRAPHY;  Surgeon: Troy Sine, MD;  Location: [place] [facility];  Service: Cardiovascular;  Laterality: N/A;   LUMBAR PERCUTANEOUS PEDICLE SCREW 3 LEVEL N/A 11/11/2017   Procedure: LUMBAR PERCUTANEOUS PEDICLE SCREW PLACEMENT LUMBAR TWO-THREE, LUMBER THREE-FOUR, LUMBAR FOUR-FIVE;  Surgeon: Jovita Gamma, MD;  Location: [facility];  Service: Neurosurgery;  Laterality: N/A;   SPINE SURGERY  12-08, 5-10   TOTAL HIP ARTHROPLASTY     oct 2006,   TOTAL HIP ARTHROPLASTY Right 02/09/2018   Procedure: RIGHT TOTAL HIP ARTHROPLASTY ANTERIOR APPROACH;  Surgeon: Paralee Cancel, MD;  Location: [facility];  Service: Orthopedics;  Laterality: Right;   Family History   Problem Relation Age of Onset   Diabetes Mother    Coronary artery disease Mother 39   Hypertension Mother    Other Father 49   Colon polyps Neg Hx    Esophageal cancer Neg Hx    Rectal cancer Neg Hx    Stomach cancer Neg Hx    Social History   Socioeconomic History   Marital status: Married    Spouse name: Not on file   Number of children: Not on file   Years of education: Not on file   Highest education level: Not on file  Occupational History   Not on file  Tobacco Use   Smoking status: Never   Smokeless tobacco: Never  Vaping Use   Vaping Use: Never used  Substance and Sexual Activity   Alcohol use: No   Drug use: No   Sexual activity: Not on file  Other Topics Concern   Not on file  Social History Narrative   Divorced, Exercises  regularly   Social Determinants of Health   Financial Resource Strain: Low Risk    Difficulty of Paying Living Expenses: Not hard at all  Food Insecurity: No Food Insecurity   Worried About Charity fundraiser in the Last Year: Never true   Arboriculturist in the Last Year: Never true  Transportation Needs: No Transportation Needs   Lack of Transportation (Medical): No   Lack of Transportation (Non-Medical): No  Physical Activity: Sufficiently Active   Days of Exercise per Week: 5 days   Minutes of Exercise per Session: 30 min  Stress: No Stress Concern Present   Feeling of Stress : Not at all  Social Connections: Moderately Isolated   Frequency of Communication with Friends and Family: More than three times a week   Frequency of Social Gatherings with Friends and Family: More than three times a week   Attends Religious Services: Never   Marine scientist or Organizations: No   Attends Music therapist: Never   Marital Status: Married    Tobacco Counseling Counseling given: Not Answered   Clinical Intake:  Pre-visit preparation completed: Yes  Pain : No/denies pain     BMI - recorded:  25.57 Nutritional Status: BMI 25 -29 Overweight Nutritional Risks: None Diabetes: No  How often do you need to have someone help you when you read instructions, pamphlets, or other written materials from your doctor or pharmacy?: 1 - Never  Diabetic?no  Interpreter Needed?: No  Information entered by :: Charlott Rakes, LPN   Activities of Daily Living In your present state of health, do you have any difficulty performing the following activities: 03/13/2021  Hearing? N  Vision? N  Difficulty concentrating or making decisions? N  Walking or climbing stairs? N  Dressing or bathing? N  Doing errands, shopping? N  Preparing Food and eating ? N  Using the Toilet? N  In the past six months, have you accidently leaked urine? N  Do you have problems with loss of bowel control? N  Managing your Medications? N  Managing your Finances? N  Housekeeping or managing your Housekeeping? N  Some recent data might be hidden    Patient Care Team: Caren Macadam, MD as PCP - General (Family Medicine) Troy Sine, MD as PCP - Cardiology (Cardiology) Festus Aloe, MD as Consulting Physician (Urology) Danella Sensing, MD as Consulting Physician (Dermatology) Michael Boston, MD as Consulting Physician (General Surgery) Paralee Cancel, MD as Consulting Physician (Orthopedic Surgery) Jovita Gamma, MD as Consulting Physician (Neurosurgery) Viona Gilmore, [facility] as Pharmacist (Pharmacist)  Indicate any recent Medical Services you may have received from other than [facility] providers in the past year (date may be approximate).     Assessment:   This is a routine wellness examination for [place].  Hearing/Vision screen Hearing Screening - Comments:: Pt denies any hearing issues  Vision Screening - Comments:: Pt follows up with dr Katy Fitch for annual eye exams   Dietary issues and exercise activities discussed: Current Exercise Habits: Home exercise routine, Type of exercise: walking, Time  (Minutes): 30, Frequency (Times/Week): 5, Weekly Exercise (Minutes/Week): 150   Goals Addressed             This Visit's Progress    Patient Stated       stay healthy       Depression Screen PHQ 2/9 Scores 03/13/2021 12/19/2020 03/05/2020 01/09/2020 03/02/2019 01/25/2018 01/20/2017  PHQ - 2 Score 0 0 0 0 0 2 0  PHQ- 9 Score - 1 - 0 - 2 -    Fall Risk Fall Risk  03/13/2021 12/19/2020 03/05/2020 01/09/2020 03/02/2019  Falls in the past year? 0 0 0 0 0  Number falls in past yr: 0 0 0 0 0  Injury with Fall? 0 0 0 0 0  Risk for fall due to : Impaired vision - - Medication side effect;Orthopedic patient -  Follow up Falls prevention discussed - - Falls evaluation completed;Falls prevention discussed -    FALL RISK PREVENTION PERTAINING TO THE HOME:  Any stairs in or around the home? Yes  If so, are there any without handrails? Yes  Home free of loose throw rugs in walkways, pet beds, electrical cords, etc? No  Adequate lighting in your home to reduce risk of falls? No   ASSISTIVE DEVICES UTILIZED TO PREVENT FALLS:  Life alert? No  Use of a cane, walker or w/c? No  Grab bars in the bathroom? No  Shower chair or bench in shower? Yes  Elevated toilet seat or a handicapped toilet? No   TIMED UP AND GO:  Was the test performed? No .   Cognitive Function:     6CIT Screen 03/13/2021  What Year? 0 points  What month? 0 points  What time? 0 points  Count back from 20 0 points  Months in reverse 0 points  Repeat phrase 0 points  Total Score 0    Immunizations Immunization History  Administered Date(s) Administered   Fluad Quad(high Dose 65+) 03/02/2019, 03/05/2020   Influenza Split 03/30/2013   Influenza, High Dose Seasonal PF 03/26/2015, 03/25/2016, 04/13/2017, 03/23/2018   Influenza,inj,Quad PF,6+ Mos 03/21/2014   PFIZER(Purple Top)SARS-COV-2 Vaccination 07/10/2019, 08/01/2019, 03/31/2020   Pneumococcal Conjugate-13 03/21/2014   Pneumococcal Polysaccharide-23 03/26/2015   Td  07/19/2009, 05/24/2020   Zoster Recombinat (Shingrix) 09/14/2020, 02/15/2021    TDAP status: Due, Education has been provided regarding the importance of this vaccine. Advised may receive this vaccine at local pharmacy or Health Dept. Aware to provide a copy of the vaccination record if obtained from local pharmacy or Health Dept. Verbalized acceptance and understanding.  Flu Vaccine status: Due, Education has been provided regarding the importance of this vaccine. Advised may receive this vaccine at local pharmacy or Health Dept. Aware to provide a copy of the vaccination record if obtained from local pharmacy or Health Dept. Verbalized acceptance and understanding.  Pneumococcal vaccine status: Up to date  Covid-19 vaccine status: Completed vaccines  Qualifies for Shingles Vaccine? Yes   Zostavax completed No   Shingrix Completed?: No.    Education has been provided regarding the importance of this vaccine. Patient has been advised to call insurance company to determine out of pocket expense if they have not yet received this vaccine. Advised may also receive vaccine at local pharmacy or Health Dept. Verbalized acceptance and understanding.  Screening Tests Health Maintenance  Topic Date Due   Hepatitis C Screening  Never done   COVID-19 Vaccine (4 - Booster for Pfizer series) 08/01/2020   INFLUENZA VACCINE  01/14/2021   COLONOSCOPY (Pts 45-2yrs Insurance coverage will need to be confirmed)  10/26/2022   TETANUS/TDAP  05/24/2030   Zoster Vaccines- Shingrix  Completed   HPV VACCINES  Aged Out    Health Maintenance  Health Maintenance Due  Topic Date Due   Hepatitis C Screening  Never done   COVID-19 Vaccine (4 - Booster for Pfizer series) 08/01/2020   INFLUENZA VACCINE  01/14/2021    Colorectal cancer  screening: Type of screening: Colonoscopy. Completed 10/25/12. Repeat every 10 years  Additional Screening:  Hepatitis C Screening: does qualify  Vision Screening:  Recommended annual ophthalmology exams for early detection of glaucoma and other disorders of the eye. Is the patient up to date with their annual eye exam?  Yes  Who is the provider or what is the name of the office in which the patient attends annual eye exams? Dr Katy Fitch  If pt is not established with a provider, would they like to be referred to a provider to establish care? No .   Dental Screening: Recommended annual dental exams for proper oral hygiene  Community Resource Referral / Chronic Care Management: CRR required this visit?  No   CCM required this visit?  No      Plan:     I have personally reviewed and noted the following in the patient's chart:   Medical and social history Use of alcohol, tobacco or illicit drugs  Current medications and supplements including opioid prescriptions. Patient is currently taking opioid prescriptions. Information provided to patient regarding non-opioid alternatives. Patient advised to discuss non-opioid treatment plan with their provider. Functional ability and status Nutritional status Physical activity Advanced directives List of other physicians Hospitalizations, surgeries, and ER visits in previous 12 months Vitals Screenings to include cognitive, depression, and falls Referrals and appointments  In addition, I have reviewed and discussed with patient certain preventive protocols, quality metrics, and best practice recommendations. A written personalized care plan for preventive services as well as general preventive health recommendations were provided to patient.     Willette Brace, LPN   0/51/1021   Nurse Notes: None

## 2021-03-15 DIAGNOSIS — I10 Essential (primary) hypertension: Secondary | ICD-10-CM

## 2021-03-15 DIAGNOSIS — E785 Hyperlipidemia, unspecified: Secondary | ICD-10-CM

## 2021-03-20 ENCOUNTER — Ambulatory Visit (INDEPENDENT_AMBULATORY_CARE_PROVIDER_SITE_OTHER): Payer: Medicare Other | Admitting: *Deleted

## 2021-03-20 ENCOUNTER — Other Ambulatory Visit: Payer: Self-pay

## 2021-03-20 DIAGNOSIS — Z23 Encounter for immunization: Secondary | ICD-10-CM

## 2021-03-21 ENCOUNTER — Telehealth (HOSPITAL_COMMUNITY): Payer: Self-pay

## 2021-03-21 NOTE — Telephone Encounter (Signed)
Pt called back and stated that he is interested in the cardiac rehab program. I advised pt that we were booked for the month of Oct and that we would call him back to schedule for Nov.

## 2021-03-26 ENCOUNTER — Other Ambulatory Visit: Payer: Self-pay

## 2021-03-26 ENCOUNTER — Encounter (HOSPITAL_COMMUNITY): Payer: Self-pay

## 2021-03-26 ENCOUNTER — Ambulatory Visit (HOSPITAL_COMMUNITY): Payer: Medicare Other | Attending: Cardiovascular Disease

## 2021-03-26 DIAGNOSIS — I2111 ST elevation (STEMI) myocardial infarction involving right coronary artery: Secondary | ICD-10-CM | POA: Insufficient documentation

## 2021-03-26 LAB — ECHOCARDIOGRAM COMPLETE
Area-P 1/2: 3.17 cm2
P 1/2 time: 674 msec
S' Lateral: 3.6 cm

## 2021-03-26 NOTE — Progress Notes (Signed)
Urgent findings reported to Dr. Acie Fredrickson Urgent findings reported time 09:30 Urgent findings acknowledge Yes Disposition of patient at discharge (if patient is discharged) Patient was discharged

## 2021-03-28 ENCOUNTER — Encounter (HOSPITAL_COMMUNITY): Payer: Self-pay

## 2021-03-28 NOTE — Telephone Encounter (Signed)
Called pt to see if he was ready to schedule for cardiac rehab because there was a cancellation, left a message for pt to call back.

## 2021-03-28 NOTE — Telephone Encounter (Signed)
Pt returned phone call in regards to scheduling for cardiac rehab, pt stated that he still wants to participate in the cardiac rehab but he is busy the rest of the month of October and that he wants to schedule for November. I advised pt that we will contact him once November schedule is complete.

## 2021-03-29 DIAGNOSIS — R350 Frequency of micturition: Secondary | ICD-10-CM | POA: Diagnosis not present

## 2021-04-01 ENCOUNTER — Telehealth: Payer: Self-pay | Admitting: Cardiovascular Disease

## 2021-04-01 NOTE — Telephone Encounter (Signed)
   [facility] Pre-operative Risk Assessment    Patient Name: Bryan [name]  DOB: [birth year] MRN: [id number]  HEARTCARE STAFF:  - IMPORTANT!!!!!! Under Visit Info/Reason for Call, type in Other and utilize the format Clearance MM/DD/YY or Clearance TBD. Do not use dashes or single digits. - Please review there is not already an duplicate clearance open for this procedure. - If request is for dental extraction, please clarify the # of teeth to be extracted. - If the patient is currently at the dentist's office, call Pre-Op Callback Staff (MA/nurse) to input urgent request.  - If the patient is not currently in the dentist office, please route to the Pre-Op pool.  Request for surgical clearance:  What type of surgery is being performed? Possible root canal  When is this surgery scheduled? 04/02/21  What type of clearance is required (medical clearance vs. Pharmacy clearance to hold med vs. Both)? Both   Are there any medications that need to be held prior to surgery and how long? TBD by cardiologist  Practice name and name of physician performing surgery? [facility]; Dr. Edwyna Ready [place]  What is the office phone number? [phone]   7.   What is the office fax number? [phone]  8.   Anesthesia type (None, local, MAC, general) ? Local   Durel Salts 04/01/2021, 10:25 AM  _________________________________________________________________   (provider comments below)

## 2021-04-01 NOTE — Telephone Encounter (Signed)
I s/w Jeani Hawking with [facility]; Dr. Edwyna Ready [place]. Per lynn, they do not do any teeth extractions only root canals. Dr. Loyal Gambler is asking if pt needs SBE? Pt is on Brilinta and ASA though was stated don't normally hold blood thinners for this procedure as there is minimal bleeding involved. No teeth are being extracted.

## 2021-04-01 NOTE — Telephone Encounter (Signed)
Need to know how many teeth are involved?

## 2021-04-01 NOTE — Telephone Encounter (Signed)
   Patient Name: Bryan [name]  DOB: 1945/11/28 MRN: [id number]  Primary Cardiologist: Shelva Majestic, MD  Chart reviewed as part of pre-operative protocol coverage.   Root Canal surgery is considered low risk. Patient just had heart attack in August, given high risk of stent thrombosis, we do NOT recommend holding aspirin or Brilinta. He may proceed with the root canal procedure on the aspirin and Brilinta.   SBE prophylaxis is not required for the patient from a cardiac standpoint.  I will route this recommendation to the requesting party via Epic fax function and remove from pre-op pool.  Please call with questions.  Almyra Deforest, [place] 04/01/2021, 4:36 PM

## 2021-04-05 ENCOUNTER — Ambulatory Visit: Payer: Medicare Other | Admitting: Cardiovascular Disease

## 2021-04-10 ENCOUNTER — Encounter (HOSPITAL_COMMUNITY): Payer: Self-pay

## 2021-04-10 NOTE — Telephone Encounter (Signed)
Attempted to call patient in regards to Cardiac Rehab - LM on VM Mailed letter 

## 2021-04-12 ENCOUNTER — Ambulatory Visit: Payer: Medicare Other | Admitting: Cardiovascular Disease

## 2021-04-12 ENCOUNTER — Encounter: Payer: Self-pay | Admitting: Cardiovascular Disease

## 2021-04-12 ENCOUNTER — Other Ambulatory Visit: Payer: Self-pay

## 2021-04-12 VITALS — BP 128/80 | HR 63 | Ht 76.0 in | Wt 203.6 lb

## 2021-04-12 DIAGNOSIS — E785 Hyperlipidemia, unspecified: Secondary | ICD-10-CM | POA: Diagnosis not present

## 2021-04-12 DIAGNOSIS — I7121 Aneurysm of the ascending aorta, without rupture: Secondary | ICD-10-CM

## 2021-04-12 DIAGNOSIS — I2111 ST elevation (STEMI) myocardial infarction involving right coronary artery: Secondary | ICD-10-CM

## 2021-04-12 DIAGNOSIS — Z8719 Personal history of other diseases of the digestive system: Secondary | ICD-10-CM

## 2021-04-12 DIAGNOSIS — I10 Essential (primary) hypertension: Secondary | ICD-10-CM

## 2021-04-12 MED ORDER — LOSARTAN POTASSIUM 25 MG PO TABS: ORAL_TABLET | ORAL | 3 refills | Status: DC

## 2021-04-12 NOTE — Progress Notes (Signed)
Cardiology Office Note    Date:  04/14/2021   ID:  STANFORD STRAUCH, DOB [birth year], MRN [id number]  PCP:  Caren Macadam, MD  Cardiologist:  Shelva Majestic, MD   2 month F/U office visit   History of Present Illness:  CLIDE REMMERS is a 75 y.o. male who who I saw for his initial office evaluation on February 05, 2021 following his inferior STEMI.  He presents for 16-monthfollow-up evaluation.    Mr. BKubisiakhas a history of hypertension, hyperlipidemia, spinal stenosis and GERD.  He has remained fairly healthy and had exercised regularly swimming almost every day and playing golf multiple times per week.  He apparently has had issues with some anxiety.  On January 17, 2021 he had seen his CPA in the morning.  Upon going home he began to notice substernal chest tightness at around 10:30 AM associated with shortness of breath and nausea.  Symptoms initially resolved but ultimately symptoms returned.  EMS was called and he was found to have inferior STEMI on ECG.  Code STEMI was activated [facility].  I performed emergent cardiac catheterization which demonstrated his acute ST segment elevation of his inferior wall secondary to mid-distal thrombotic occlusion of a large dominant calcified RCA.  There was no significant obstruction in the left coronary circulation.  His intervention was very difficult but ultimately successful.  There were significant catheter backup difficulties with his calcified and tortuous vessel needing GuideLiner support for ultimate stenting near the acute margin with a 3.5 x 15 mm Medtronic Onyx frontier stent postdilated to 3.8 mm with 100% occlusion being reduced to 0%.  He also had 80% proximal RCA stenosis which was successfully stented with a 3.5 x 18 mm stent postdilated to 3.85 mm.  He had additional diffuse irregularity in the RCA of 4030% in the proximal and distal segment with 60% stenosis  in the PLA vessel.  There was thrombotic occlusion of the very distal portion of the PDA vessel for which Aggrenox was initiated.  A subsequent echo Doppler study on January 18, 2021 showed an ejection fraction reduced at 35 to 40%.  His hospital course was uneventful and he was discharged on January 19, 2021.  Prior to admission he was on lisinopril and hydrochlorothiazide as an outpatient for hypertension.  He was treated with beta-blocker therapy and currently is now on aspirin/Brilinta for DAPT, losartan 25 mg in addition to metoprolol succinate 25 mg daily post MI.  He is on rosuvastatin 40 mg and fish oil antiplatelet therapy.  I saw him for his initial post hospital office evaluation with me on February 05, 2021.  Since hospital discharge she had felt well and denied any chest pain or shortness of breath.  He denied any palpitations, presyncope or syncope.  Previously he had gone to the pool to swim almost on a daily basis to do exercises.  He was anxious to get back to the pool.  During that evaluation, with his EF of 35 to 40% I recommended titration of losartan from 25 mg daily to 25 mg twice a day.  He continues to be on metoprolol succinate 25 mg daily.  I recommended that he have a follow-up echo Doppler study in several months and if LV remains depressed at that time I discussed [facility].  He continues to be on rosuvastatin 40 mg.  LDL cholesterol  in August for was 71.  His ECG showed sinus rhythm with first-degree AV block with inferior Q waves and T wave inversion.  Mr. Choyce underwent his follow-up echo Doppler study March 26, 2021.  LV function had improved and was now 45 to 50% with only mild inferior posterior hypokinesis.  He had normal RV systolic function and normal PA pressures.  Of note, on his prior echo Doppler study his ascending aorta measured 45 mm, but on this echo it was measured as 49 mm.  Presently, Mr. Pember  continues to feel well.  He admits that he is a very nervous gentleman and gets anxious often.  He is back at the pool and doing exercises and swimming.  He also has been playing golf without chest pain.  He denies any palpitations.  He presents for reevaluation. Past Medical History:  Diagnosis Date   Anxiety    Cancer ([place])    facial skin cancer   Depression    GERD (gastroesophageal reflux disease)    takes tums rarely   Hematuria    1 time in july 2019   Hypertension    Osteoarthritis    Spinal stenosis    ST elevation myocardial infarction involving right coronary artery [facility])     Past Surgical History:  Procedure Laterality Date   ACHILLES TENDON SURGERY  02/2006   ANTERIOR LAT LUMBAR FUSION N/A 11/11/2017   Procedure: LUMBAR TWO- LUMBAR THREE, LUMBAR THREE- LUMBAR FOUR, LUMBAR FOUR- LUMBAR FIVE ANTERIOLATERAL INTERBODY ARTHRODESIS;  Surgeon: Jovita Gamma, MD;  [facility];  Service: Neurosurgery;  Laterality: N/A;   COLONOSCOPY  2003   COLONOSCOPY     CORONARY STENT INTERVENTION N/A 01/17/2021   Procedure: CORONARY STENT INTERVENTION;  Surgeon: Troy Sine, MD;  Location: [place] [facility];  Service: Cardiovascular;  Laterality: N/A;   CORONARY/GRAFT ACUTE MI REVASCULARIZATION N/A 01/17/2021   Procedure: Coronary/Graft Acute MI Revascularization;  Surgeon: Troy Sine, MD;  Location: [place] [facility];  Service: Cardiovascular;  Laterality: N/A;   CYSTOSCOPY N/A 05/21/2018   Procedure: CYSTOSCOPY FLEXIBLE;  Surgeon: Festus Aloe, MD;  Location: [facility];  Service: Urology;  Laterality: N/A;   KNEE SURGERY Left 1991 and 2001   X2 arthroscopy   LEFT HEART CATH AND CORONARY ANGIOGRAPHY N/A 01/17/2021   Procedure: LEFT HEART CATH AND CORONARY ANGIOGRAPHY;  Surgeon: Troy Sine, MD;  Location: [place] [facility];  Service: Cardiovascular;  Laterality: N/A;   LUMBAR PERCUTANEOUS PEDICLE SCREW 3 LEVEL N/A 11/11/2017   Procedure: LUMBAR  PERCUTANEOUS PEDICLE SCREW PLACEMENT LUMBAR TWO-THREE, LUMBER THREE-FOUR, LUMBAR FOUR-FIVE;  Surgeon: Jovita Gamma, MD;  Location: [place];  Service: Neurosurgery;  Laterality: N/A;   SPINE SURGERY  12-08, 5-10   TOTAL HIP ARTHROPLASTY     oct 2006,   TOTAL HIP ARTHROPLASTY Right 02/09/2018   Procedure: RIGHT TOTAL HIP ARTHROPLASTY ANTERIOR APPROACH;  Surgeon: Paralee Cancel, MD;  Location: [facility];  Service: Orthopedics;  Laterality: Right;    Current Medications: Outpatient Medications Prior to Visit  Medication Sig Dispense Refill   acetaminophen (TYLENOL) 500 MG tablet Take 500 mg by mouth daily as needed for moderate pain.     aspirin 81 MG chewable tablet Chew 1 tablet (81 mg total) by mouth daily.     calcium carbonate (TUMS - DOSED IN MG ELEMENTAL CALCIUM) 500 MG chewable tablet Chew 2 tablets by mouth daily as needed for indigestion or heartburn.     Cholecalciferol (VITAMIN D3) 2000  units TABS Take 2,000 Units by mouth daily.     gabapentin (NEURONTIN) 100 MG capsule TAKE TWO CAPSULES AT BEDTIME. (Patient taking differently: Take 200 mg by mouth at bedtime.) 180 capsule 1   metoprolol succinate (TOPROL-XL) 25 MG 24 hr tablet Take 1 tablet (25 mg total) by mouth daily. 30 tablet 11   Multiple Vitamins-Minerals (HAIR SKIN AND NAILS FORMULA PO) Take 1 tablet by mouth daily.     nitroGLYCERIN (NITROSTAT) 0.4 MG SL tablet Place 1 tablet (0.4 mg total) under the tongue every 5 (five) minutes as needed for chest pain. 25 tablet 3   Omega-3 Fatty Acids (FISH OIL) 1200 MG CAPS Take 1 capsule (1,200 mg total) by mouth daily. 30 capsule 1   rosuvastatin (CRESTOR) 40 MG tablet Take 1 tablet (40 mg total) by mouth daily. 30 tablet 11   ticagrelor (BRILINTA) 90 MG TABS tablet Take 1 tablet (90 mg total) by mouth 2 (two) times daily. 60 tablet 11   tiZANidine (ZANAFLEX) 4 MG tablet Take 1 tablet (4 mg total) by mouth every 8 (eight) hours as needed for muscle spasms. 90 tablet 1   traMADol (ULTRAM)  50 MG tablet Take 1 tablet (50 mg total) by mouth every 6 (six) hours as needed for moderate pain. 120 tablet 2   vitamin B-12 (CYANOCOBALAMIN) 1000 MCG tablet Take 1,000 mcg by mouth daily.     vitamin C (ASCORBIC ACID) 250 MG tablet Take 250 mg by mouth daily.     losartan (COZAAR) 25 MG tablet Take 1 tablet (25 mg total) by mouth 2 (two) times daily. 60 tablet 3   No facility-administered medications prior to visit.     Allergies:   Codeine   Social History   Socioeconomic History   Marital status: Married    Spouse name: Not on file   Number of children: Not on file   Years of education: Not on file   Highest education level: Not on file  Occupational History   Not on file  Tobacco Use   Smoking status: Never   Smokeless tobacco: Never  Vaping Use   Vaping Use: Never used  Substance and Sexual Activity   Alcohol use: No   Drug use: No   Sexual activity: Not on file  Other Topics Concern   Not on file  Social History Narrative   Divorced, Exercises regularly   Social Determinants of Health   Financial Resource Strain: Low Risk    Difficulty of Paying Living Expenses: Not hard at all  Food Insecurity: No Food Insecurity   Worried About Charity fundraiser in the Last Year: Never true   Arboriculturist in the Last Year: Never true  Transportation Needs: No Transportation Needs   Lack of Transportation (Medical): No   Lack of Transportation (Non-Medical): No  Physical Activity: Sufficiently Active   Days of Exercise per Week: 5 days   Minutes of Exercise per Session: 30 min  Stress: No Stress Concern Present   Feeling of Stress : Not at all  Social Connections: Moderately Isolated   Frequency of Communication with Friends and Family: More than three times a week   Frequency of Social Gatherings with Friends and Family: More than three times a week   Attends Religious Services: Never   Marine scientist or Organizations: No   Attends Archivist  Meetings: Never   Marital Status: Married    Socially he is married.  There is no  tobacco use or alcohol use.  He exercises regularly.  Family History:  The patient's family history includes Coronary artery disease (age of onset: 37) in his mother; Diabetes in his mother; Hypertension in his mother; Other (age of onset: 47) in his father.   ROS General: Negative; No fevers, chills, or night sweats;  HEENT: Negative; No changes in vision or hearing, sinus congestion, difficulty swallowing Pulmonary: Negative; No cough, wheezing, shortness of breath, hemoptysis Cardiovascular: Negative; No chest pain, presyncope, syncope, palpitations GI: Negative; No nausea, vomiting, diarrhea, or abdominal pain GU: Negative; No dysuria, hematuria, or difficulty voiding Musculoskeletal: Negative; no myalgias, joint pain, or weakness Hematologic/Oncology: Negative; no easy bruising, bleeding Endocrine: Negative; no heat/cold intolerance; no diabetes Neuro: Negative; no changes in balance, headaches Skin: Negative; No rashes or skin lesions Psychiatric: He admits to some anxiety Sleep: Negative; No snoring, daytime sleepiness, hypersomnolence, bruxism, restless legs, hypnogognic hallucinations, no cataplexy Other comprehensive 14 point system review is negative.   PHYSICAL EXAM:   VS:  BP 128/80   Pulse 63   Ht _0  (1.93 m)   Wt 203 lb 9.6 oz (92.4 kg)   SpO2 99%   BMI 24.78 kg/m     Repeat blood pressure by me was 132/78.  Wt Readings from Last 3 Encounters:  04/12/21 203 lb 9.6 oz (92.4 kg)  02/05/21 204 lb 9.6 oz (92.8 kg)  01/17/21 211 lb (95.7 kg)    General: Alert, oriented, no distress.  Skin: normal turgor, no rashes, warm and dry HEENT: Normocephalic, atraumatic. Pupils equal round and reactive to light; sclera anicteric; extraocular muscles intact;  Nose without nasal septal hypertrophy Mouth/Parynx benign; Mallinpatti scale 3 Neck: No JVD, no carotid bruits; normal carotid  upstroke Lungs: clear to ausculatation and percussion; no wheezing or rales Chest wall: without tenderness to palpitation Heart: PMI not displaced, RRR, s1 s2 normal, 1/6 systolic murmur, no diastolic murmur, no rubs, gallops, thrills, or heaves Abdomen: soft, nontender; no hepatosplenomehaly, BS+; abdominal aorta nontender and not dilated by palpation. Back: no CVA tenderness Pulses 2+ Musculoskeletal: full range of motion, normal strength, no joint deformities Extremities: no clubbing cyanosis or edema, Homan's sign negative  Neurologic: grossly nonfocal; Cranial nerves grossly wnl Psychologic: Normal mood and affect   Studies/Labs Reviewed:   EKG:  EKG is ordered today.  ECG (independently read by me): Normal sinus rhythm at 63 bpm, first-degree block with a PR 220 msec; Nonspecific T changes in lead III.  February 05, 2021 23, 2022 ECG (independently read by me):  Sinus rhythm, 1st degree AV block; PVCs, inferior Q wave and T wave inversion III, aVF, QTc 421 msec  Recent Labs: BMP Latest Ref Rng & Units 04/12/2021 01/18/2021 01/17/2021  Glucose 70 - 99 mg/dL 76 97 129(H)  BUN 8 - 27 mg/dL 16 20 26(H)  Creatinine 0.76 - 1.27 mg/dL 1.07 1.15 1.10  BUN/Creat Ratio 10 - 24 15 - -  Sodium 134 - 144 mmol/L 147(H) 138 134(L)  Potassium 3.5 - 5.2 mmol/L 4.7 3.7 3.8  Chloride 96 - 106 mmol/L 106 107 100  CO2 20 - 29 mmol/L 27 21(L) -  Calcium 8.6 - 10.2 mg/dL 10.5(H) 9.3 -     Hepatic Function Latest Ref Rng & Units 01/17/2021 12/19/2020 03/05/2020  Total Protein 6.5 - 8.1 g/dL 6.3(L) 6.9 6.7  Albumin 3.5 - 5.0 g/dL 4.0 4.5 -  AST 15 - 41 U/L _1 ALT 0 - 44 U/L _2 Alk Phosphatase  38 - 126 U/L 38 47 -  Total Bilirubin 0.3 - 1.2 mg/dL 1.6(H) 1.0 1.2  Bilirubin, Direct 0.0 - 0.3 mg/dL - - -    CBC Latest Ref Rng & Units 01/18/2021 01/17/2021 01/17/2021  WBC 4.0 - 10.5 K/uL 9.5 - 11.6(H)  Hemoglobin 13.0 - 17.0 g/dL 16.6 15.0 16.8  Hematocrit 39.0 - 52.0 % 50.3 44.0 48.6  Platelets  150 - 400 K/uL 187 - 214   Lab Results  Component Value Date   MCV 94.7 01/18/2021   MCV 90.0 01/17/2021   MCV 92.6 12/19/2020   Lab Results  Component Value Date   TSH 2.17 03/02/2019   Lab Results  Component Value Date   HGBA1C 5.5 01/17/2021     BNP No results found for: BNP  ProBNP No results found for: PROBNP   Lipid Panel     Component Value Date/Time   CHOL 125 01/17/2021 1208   TRIG 109 01/17/2021 1208   TRIG 95 05/26/2006 0848   HDL 37 (L) 01/17/2021 1208   CHOLHDL 3.4 01/17/2021 1208   VLDL 22 01/17/2021 1208   LDLCALC 66 01/17/2021 1208   LDLCALC 107 (H) 03/05/2020 0839   LDLDIRECT 128.0 03/02/2019 0852     RADIOLOGY: ECHOCARDIOGRAM COMPLETE  Result Date: 03/26/2021    ECHOCARDIOGRAM REPORT   Patient Name:   ARISTON GRANDISON Date of Exam: 03/26/2021 Medical Rec #:  [id number]       Height:       75.0 in Accession #:    [id number]      Weight:       204.6 lb Date of Birth:  [birth year]      BSA:          2.216 m Patient Age:    32 years        BP:           130/72 mmHg Patient Gender: M               HR:           69 bpm. Exam Location:  [place] Procedure: 2D Echo, 3D Echo, Color Doppler and Cardiac Doppler Indications:    I21.11 MI  History:        Patient has prior history of Echocardiogram examinations, most                 recent 01/18/2021. Previous Myocardial Infarction; Risk                 Factors:Hypertension.  Sonographer:    Basilia Jumbo BS, RDCS Referring Phys: [place]  1. Left ventricular ejection fraction, by estimation, is 45 to 50%. The left ventricle has mildly decreased function. The left ventricle demonstrates regional wall motion abnormalities (see scoring diagram/findings for description). Left ventricular diastolic parameters are consistent with Grade I diastolic dysfunction (impaired relaxation).  2. Right ventricular systolic function is normal. The right ventricular size is normal. Tricuspid regurgitation signal  is inadequate for assessing PA pressure.  3. The mitral valve is grossly normal. Trivial mitral valve regurgitation. No evidence of mitral stenosis.  4. The aortic valve is tricuspid. Aortic valve regurgitation is mild. No aortic stenosis is present.  5. Ascending aorta is aneurysmal up to 49 mm. Would recommend cross sectional imaging for better characterization. Aneurysm of the ascending aorta, measuring 49 mm.  6. The inferior vena cava is normal in size with greater than 50% respiratory variability, suggesting right atrial pressure of  3 mmHg. Comparison(s): Changes from prior study are noted. EF has improved. Inferior/posterior walls remain hypokinetic. FINDINGS  Left Ventricle: Left ventricular ejection fraction, by estimation, is 45 to 50%. The left ventricle has mildly decreased function. The left ventricle demonstrates regional wall motion abnormalities. 3D left ventricular ejection fraction analysis performed but not reported based on interpreter judgement due to suboptimal quality. The left ventricular internal cavity size was normal in size. There is no left ventricular hypertrophy. Left ventricular diastolic parameters are consistent with Grade I  diastolic dysfunction (impaired relaxation).  LV Wall Scoring: The entire inferior wall and posterior wall are hypokinetic. Right Ventricle: The right ventricular size is normal. No increase in right ventricular wall thickness. Right ventricular systolic function is normal. Tricuspid regurgitation signal is inadequate for assessing PA pressure. Left Atrium: Left atrial size was normal in size. Right Atrium: Right atrial size was normal in size. Pericardium: Trivial pericardial effusion is present. Presence of pericardial fat pad. Mitral Valve: The mitral valve is grossly normal. Trivial mitral valve regurgitation. No evidence of mitral valve stenosis. Tricuspid Valve: The tricuspid valve is grossly normal. Tricuspid valve regurgitation is trivial. No evidence  of tricuspid stenosis. Aortic Valve: The aortic valve is tricuspid. Aortic valve regurgitation is mild. Aortic regurgitation PHT measures 674 msec. No aortic stenosis is present. Pulmonic Valve: The pulmonic valve was grossly normal. Pulmonic valve regurgitation is not visualized. No evidence of pulmonic stenosis. Aorta: Ascending aorta is aneurysmal up to 49 mm. Would recommend cross sectional imaging for better characterization. The aortic root is normal in size and structure. There is an aneurysm involving the ascending aorta measuring 49 mm. Venous: The inferior vena cava is normal in size with greater than 50% respiratory variability, suggesting right atrial pressure of 3 mmHg. IAS/Shunts: The atrial septum is grossly normal.  LEFT VENTRICLE PLAX 2D LVIDd:         5.70 cm   Diastology LVIDs:         3.60 cm   LV e' medial:    5.96 cm/s LV PW:         1.00 cm   LV E/e' medial:  7.6 LV IVS:        0.80 cm   LV e' lateral:   6.75 cm/s LVOT diam:     2.80 cm   LV E/e' lateral: 6.7 LV SV:         114 LV SV Index:   51 LVOT Area:     6.16 cm                           3D Volume EF:                          3D EF:        53 %                          LV EDV:       168 ml                          LV ESV:       79 ml                          LV SV:        89 ml RIGHT  VENTRICLE RV Basal diam:  2.90 cm RV S prime:     10.30 cm/s TAPSE (M-mode): 2.0 cm LEFT ATRIUM             Index        RIGHT ATRIUM           Index LA diam:        4.20 cm 1.90 cm/m   RA Pressure: 3.00 mmHg LA Vol (A2C):   39.6 ml 17.87 ml/m  RA Area:     17.60 cm LA Vol (A4C):   32.3 ml 14.58 ml/m  RA Volume:   49.30 ml  22.25 ml/m LA Biplane Vol: 38.6 ml 17.42 ml/m  AORTIC VALVE LVOT Vmax:   82.30 cm/s LVOT Vmean:  49.300 cm/s LVOT VTI:    0.185 m AI PHT:      674 msec  AORTA Ao Root diam: 4.20 cm Ao Asc diam:  4.90 cm MITRAL VALVE               TRICUSPID VALVE                            Estimated RAP:  3.00 mmHg MV Decel Time: 239 msec MV E  velocity: 45.40 cm/s  SHUNTS MV A velocity: 60.10 cm/s  Systemic VTI:  0.18 m MV E/A ratio:  0.76        Systemic Diam: 2.80 cm Eleonore Chiquito MD Electronically signed by Eleonore Chiquito MD Signature Date/Time: 03/26/2021/1:28:48 PM    Final      Additional studies/ records that were reviewed today include:   EMERGENT CATH/PCI: 01/17/2021   Mid RCA to Dist RCA lesion is 100% stenosed.   Dist RCA lesion is 30% stenosed.   RPAV lesion is 60% stenosed.   RPDA lesion is 100% stenosed.   Ost RCA to Prox RCA lesion is 80% stenosed.   Prox RCA lesion is 40% stenosed.   A stent was successfully placed.   A stent was successfully placed.   Post intervention, there is a 0% residual stenosis.   Post intervention, there is a 0% residual stenosis.   LV end diastolic pressure is moderately elevated.   Acute ST segment elevation inferior wall myocardial infarction secondary to mid-distal thrombotic occlusion of the large dominant RCA.   No significant obstructive disease in the left coronary circulation.   Very difficult but successful coronary intervention to the RCA with significant catheter backup difficulties, calcified vessel with tortuosity and needing GuideLiner support for ultimate stenting near the acute margin with a 3.5 x 15 mm Resolute Onyx Frontier stent postdilated to 3.8 mm with the 100% occlusion being reduced to 0% and successful stenting of the very proximal RCA with ultimate insertion of a 3.5 x 18 mm Resolute Onyx Frontier stent postdilated to 3.85 mm with both stenosis being reduced to 0%.  There is diffuse irregularity in the RCA with 40% proximal to mid calcified stenosis, 30% distal stenosis with 60% stenosis in the PLA vessel and thrombotic occlusion of the distal portion of the PDA vessel for which Aggrenox was initiated.   RECOMMENDATION: DAPT for minimum of 12 months but probably longer.  Aggressive lipid-lowering therapy with target LDL less than 70 and preferably in the 50s or  below.  Medical therapy for concomitant CAD.       Intervention     ECHO 01/18/2021 IMPRESSIONS   1. Left ventricular ejection fraction, by estimation, is 35 to  40%. The  left ventricle has moderately decreased function. The left ventricle  demonstrates regional wall motion abnormalities (see scoring  diagram/findings for description). The left  ventricular internal cavity size was mildly dilated. Left ventricular  diastolic parameters are consistent with Grade I diastolic dysfunction  (impaired relaxation). There is severe hypokinesis of the left  ventricular, mid inferoseptal wall.   2. Right ventricular systolic function is normal. The right ventricular  size is normal.   3. Right atrial size was mildly dilated.   4. The mitral valve is normal in structure. No evidence of mitral valve  regurgitation.   5. The aortic valve is grossly normal. Aortic valve regurgitation is  mild.   6. Aortic dilatation noted. There is moderate dilatation of the ascending  aorta, measuring 45 mm.    ECHO: 03/26/2021 IMPRESSIONS   1. Left ventricular ejection fraction, by estimation, is 45 to 50%. The  left ventricle has mildly decreased function. The left ventricle  demonstrates regional wall motion abnormalities (see scoring  diagram/findings for description). Left ventricular  diastolic parameters are consistent with Grade I diastolic dysfunction  (impaired relaxation).   2. Right ventricular systolic function is normal. The right ventricular  size is normal. Tricuspid regurgitation signal is inadequate for assessing  PA pressure.   3. The mitral valve is grossly normal. Trivial mitral valve  regurgitation. No evidence of mitral stenosis.   4. The aortic valve is tricuspid. Aortic valve regurgitation is mild. No  aortic stenosis is present.   5. Ascending aorta is aneurysmal up to 49 mm. Would recommend cross  sectional imaging for better characterization. Aneurysm of the ascending   aorta, measuring 49 mm.   6. The inferior vena cava is normal in size with greater than 50%  respiratory variability, suggesting right atrial pressure of 3 mmHg.   Comparison(s): Changes from prior study are noted. EF has improved.  Inferior/posterior walls remain hypokinetic.    ASSESSMENT:    1. Aneurysm of ascending aorta without rupture   2. STEMI involving right coronary artery ([place]): 01/17/2021 with the mid and proximal RCA   3. Hyperlipidemia with target LDL less than 70   4. Essential hypertension   5. History of gastroesophageal reflux (GERD)    PLAN:  Mr. Faraaz "Lanny Hurst "Bouwens is a 75 year old gentleman who has a history of hypertension, hyperlipidemia, spinal stenosis, and GERD.  He has been active for most of his life and has been swimming daily and was playing golf several days per week.  There is no tobacco history or alcohol use.  Recently he had been more aggressive with dietary change and had not been eating any meat since early July 2022.  He presented with an acute inferior wall STEMI and was found to have mid-distal thrombotic occlusion of a large dominant RCA.  He underwent difficult but successful coronary intervention to his calcified tortuous vessel with ultimate stenting with a 3.5 x 15 mm Medtronic Onyx frontiers stent postdilated to 3.8 mm at the occlusion site with resumption of brisk TIMI-3 flow and due to proximal 80% stenosis underwent stenting with a 3.5 x 18 mm stent postdilated to 3.85 mm.  His initial EF the following day did show reduced LV function with an EF at approximately 35 to 40%.  Of note, he also had moderate dilatation of his ascending aorta at 45 mm.  He had an uneventful postoperative course and was discharged 2 days following his MI.  When I saw him for his initial post hospital  office evaluation in August 2022 with his reduced LV function, I have recommended titration of losartan to 25 mg twice a day from its present dose of 25 mg daily.  He was on  metoprolol succinate 25 mg daily.  Presently, he feels well and denies any chest pain or shortness of breath.  He is back exercising in the pool and swimming as well as playing golf.  I thoroughly reviewed his follow-up echo Doppler study with he and his wife today in detail.  LV function has improved and is now only mildly impaired at 45 to 50%.  With this improvement and class I symptoms, I will not initiate Entresto but rather further titrate losartan to 50 mg in the morning and 25 mg in the evening.  He will continue on metoprolol succinate 50 mg.  His most recent echo now suggest progressive ascending aorta dilation at 49 mm.  I recommended that he undergo a CT angio of his aorta for more definitive evaluation.  I suspect I will then refer him to establish care for follow-up with our cardiovascular surgeons.  He continues to be on rosuvastatin 40 mg for hyperlipidemia and is tolerating this well.  He has improved his diet.  He continues to be on DAPT with aspirin/Brilinta.  His ECG remained stable with improvement in prior T wave abnormalities.  I will contact him regarding his CTA of his aorta and see him in several months for follow-up evaluation or sooner as needed.    Medication Adjustments/Labs and Tests Ordered: Current medicines are reviewed at length with the patient today.  Concerns regarding medicines are outlined above.  Medication changes, Labs and Tests ordered today are listed in the Patient Instructions below. Patient Instructions  Medication Instructions:   - Increase Losartan to 54m in the morning and 227min the evening.   *If you need a refill on your cardiac medications before your next appointment, please call your pharmacy*   Lab Work: Your physician recommends that you have labs drawn today: BMET  If you have labs (blood work) drawn today and your tests are completely normal, you will receive your results only by: [place] you have MyChart) OR A paper copy  in the mail If you have any lab test that is abnormal or we need to change your treatment, we will call you to review the results.   Testing/Procedures: Non-Cardiac CT Angiography (CTA), is a special type of CT scan that uses a computer to produce multi-dimensional views of major blood vessels throughout the body. In CT angiography, a contrast material is injected through an IV to help visualize the blood vessels     Follow-Up: [facility].  As part of our continuing mission to provide you with exceptional heart care, we have created designated Provider Care Teams.  These Care Teams include your primary Cardiologist (physician) and Advanced Practice Providers (APPs -  Physician Assistants and Nurse Practitioners) who all work together to provide you with the care you need, when you need it.  We recommend signing up for the patient portal called "MyChart".  Sign up information is provided on this After Visit Summary.  MyChart is used to connect with patients for Virtual Visits (Telemedicine).  Patients are able to view lab/test results, encounter notes, upcoming appointments, etc.  Non-urgent messages can be sent to your provider as well.   To learn more about what you can do with MyChart, go to [email or web address]  Your next appointment:   3 month(s)  The format for your next appointment:   In Person  Provider:   Shelva Majestic, MD   Signed, Shelva Majestic, MD  04/14/2021 9:31 AM    [facility] [address] [place], [place], [place]  [address] Phone: [phone]

## 2021-04-12 NOTE — Patient Instructions (Signed)
Medication Instructions:   - Increase Losartan to 50mg  in the morning and 25mg  in the evening.   *If you need a refill on your cardiac medications before your next appointment, please call your pharmacy*   Lab Work: Your physician recommends that you have labs drawn today: BMET  If you have labs (blood work) drawn today and your tests are completely normal, you will receive your results only by: [place] (if you have MyChart) OR A paper copy in the mail If you have any lab test that is abnormal or we need to change your treatment, we will call you to review the results.   Testing/Procedures: Non-Cardiac CT Angiography (CTA), is a special type of CT scan that uses a computer to produce multi-dimensional views of major blood vessels throughout the body. In CT angiography, a contrast material is injected through an IV to help visualize the blood vessels     Follow-Up: At [facility], you and your health needs are our priority.  As part of our continuing mission to provide you with exceptional heart care, we have created designated Provider Care Teams.  These Care Teams include your primary Cardiologist (physician) and Advanced Practice Providers (APPs -  Physician Assistants and Nurse Practitioners) who all work together to provide you with the care you need, when you need it.  We recommend signing up for the patient portal called "MyChart".  Sign up information is provided on this After Visit Summary.  MyChart is used to connect with patients for Virtual Visits (Telemedicine).  Patients are able to view lab/test results, encounter notes, upcoming appointments, etc.  Non-urgent messages can be sent to your provider as well.   To learn more about what you can do with MyChart, go to [email or web address].    Your next appointment:   3 month(s)  The format for your next appointment:   In Person  Provider:   Shelva Majestic, MD

## 2021-04-13 LAB — BASIC METABOLIC PANEL
BUN/Creatinine Ratio: 15 (ref 10–24)
BUN: 16 mg/dL (ref 8–27)
CO2: 27 mmol/L (ref 20–29)
Calcium: 10.5 mg/dL — ABNORMAL HIGH (ref 8.6–10.2)
Chloride: 106 mmol/L (ref 96–106)
Creatinine, Ser: 1.07 mg/dL (ref 0.76–1.27)
Glucose: 76 mg/dL (ref 70–99)
Potassium: 4.7 mmol/L (ref 3.5–5.2)
Sodium: 147 mmol/L — ABNORMAL HIGH (ref 134–144)
eGFR: 74 mL/min/{1.73_m2} (ref >59)

## 2021-04-14 ENCOUNTER — Encounter: Payer: Self-pay | Admitting: Cardiovascular Disease

## 2021-04-16 ENCOUNTER — Telehealth: Payer: Self-pay | Admitting: Pharmacist

## 2021-04-16 NOTE — Chronic Care Management (AMB) (Signed)
Chronic Care Management Pharmacy Assistant   Name: Bryan [name]  MRN: [id number] DOB: [birth year]  Reason for Encounter: Disease State / Hypertension Assessment Call   Conditions to be addressed/monitored: HTN  Recent office visits:  None  Recent consult visits:  04/12/2021 Shelva Majestic MD (cardiology) - Patient was seen for Aneurysm of ascending aorta without rupture and additional issues. Increased Losartan from 25mg  BID to 50mg  AM and 25mg  PM. Follow up in 3 months.  Hospital visits:  None  Medications: Outpatient Encounter Medications as of 04/16/2021  Medication Sig   acetaminophen (TYLENOL) 500 MG tablet Take 500 mg by mouth daily as needed for moderate pain.   aspirin 81 MG chewable tablet Chew 1 tablet (81 mg total) by mouth daily.   calcium carbonate (TUMS - DOSED IN MG ELEMENTAL CALCIUM) 500 MG chewable tablet Chew 2 tablets by mouth daily as needed for indigestion or heartburn.   Cholecalciferol (VITAMIN D3) 2000 units TABS Take 2,000 Units by mouth daily.   gabapentin (NEURONTIN) 100 MG capsule TAKE TWO CAPSULES AT BEDTIME. (Patient taking differently: Take 200 mg by mouth at bedtime.)   losartan (COZAAR) 25 MG tablet Take 50mg  in the morning and 25mg  in the evening.   metoprolol succinate (TOPROL-XL) 25 MG 24 hr tablet Take 1 tablet (25 mg total) by mouth daily.   Multiple Vitamins-Minerals (HAIR SKIN AND NAILS FORMULA PO) Take 1 tablet by mouth daily.   nitroGLYCERIN (NITROSTAT) 0.4 MG SL tablet Place 1 tablet (0.4 mg total) under the tongue every 5 (five) minutes as needed for chest pain.   Omega-3 Fatty Acids (FISH OIL) 1200 MG CAPS Take 1 capsule (1,200 mg total) by mouth daily.   rosuvastatin (CRESTOR) 40 MG tablet Take 1 tablet (40 mg total) by mouth daily.   ticagrelor (BRILINTA) 90 MG TABS tablet Take 1 tablet (90 mg total) by mouth 2 (two) times daily.   tiZANidine (ZANAFLEX) 4 MG tablet Take 1 tablet (4 mg total) by mouth every 8 (eight) hours as needed  for muscle spasms.   traMADol (ULTRAM) 50 MG tablet Take 1 tablet (50 mg total) by mouth every 6 (six) hours as needed for moderate pain.   vitamin B-12 (CYANOCOBALAMIN) 1000 MCG tablet Take 1,000 mcg by mouth daily.   vitamin C (ASCORBIC ACID) 250 MG tablet Take 250 mg by mouth daily.   No facility-administered encounter medications on file as of 04/16/2021.   Fill History: GABAPENTIN  100 MG CAPS 11/26/2020 90   LOSARTAN POTASSIUM  25 MG TABS 01/19/2021 30   MELOXICAM  15 MG TABS 01/10/2021 90   METOPROLOL SUCCINATE ER  25 MG TB24 01/19/2021 30   NITROGLYCERIN  0.4 MG SUBL 01/19/2021 30   ROSUVASTATIN CALCIUM  40 MG TABS 01/19/2021 30  Reviewed chart prior to disease state call. Spoke with patient regarding BP  Recent Office Vitals: BP Readings from Last 3 Encounters:  04/12/21 128/80  02/05/21 130/72  01/19/21 123/84   Pulse Readings from Last 3 Encounters:  04/12/21 63  01/19/21 62  12/19/20 86    Wt Readings from Last 3 Encounters:  04/12/21 203 lb 9.6 oz (92.4 kg)  02/05/21 204 lb 9.6 oz (92.8 kg)  01/17/21 211 lb (95.7 kg)     Kidney Function Lab Results  Component Value Date/Time   CREATININE 1.07 04/12/2021 12:20 PM   CREATININE 1.15 01/18/2021 01:25 AM   CREATININE 1.13 03/05/2020 08:39 AM   GFR 52.44 (L) 12/19/2020 09:50 AM   GFRNONAA >  60 01/18/2021 01:25 AM   GFRAA >60 02/10/2018 04:53 AM    BMP Latest Ref Rng & Units 04/12/2021 01/18/2021 01/17/2021  Glucose 70 - 99 mg/dL 76 97 129(H)  BUN 8 - 27 mg/dL 16 20 26(H)  Creatinine 0.76 - 1.27 mg/dL 1.07 1.15 1.10  BUN/Creat Ratio 10 - 24 15 - -  Sodium 134 - 144 mmol/L 147(H) 138 134(L)  Potassium 3.5 - 5.2 mmol/L 4.7 3.7 3.8  Chloride 96 - 106 mmol/L 106 107 100  CO2 20 - 29 mmol/L 27 21(L) -  Calcium 8.6 - 10.2 mg/dL 10.5(H) 9.3 -    Current antihypertensive regimen:  Losartan 50 mg take 1 tablets in the AM and 1/2 tablet in the PM Metoprolol 25 mg daily  How often are you checking your Blood  Pressure?  Patient has purchased a monitor and has taken his blood pressure a few times a week.  He is not recording them and states they are in good range, does not recall the actual readings. He was asked to start recording them so we can see the actual readings in the future, patient agrees.   Current home BP readings: Patient states his last BP with his cardiologist was 128/80.  What recent interventions/DTPs have been made by any provider to improve Blood Pressure control since last CPP Visit: None  Any recent hospitalizations or ED visits since last visit with CPP? No  What diet changes have been made to improve Blood Pressure Control? Patient is no longer eating fried foods and rarely eating meat, states he is eating mostly vegetables and fruits, he does have cereal in the morning and is adding fruit to his cereal.  What exercise is being done to improve your Blood Pressure Control?  Patient states he is going to the pool daily and exercising in the pool.  Adherence Review: Is the patient currently on ACE/ARB medication? Yes  Does the patient have >5 day gap between last estimated fill dates? No   Care Gaps: AWV - completed 03/13/2021 Last BP - 128/80 on 04/12/2021 Hep C screen - never done Covid vaccine - overdue  Star Rating Drugs: Losartan 50 mg - last filled 04/12/2021 30 DS at [facility]  Rosuvastatin 40 mg - last filled 03/18/2021 30 DS at [facility] dates with [facility] Pharmacist Assistant [phone]

## 2021-04-24 ENCOUNTER — Telehealth: Payer: Self-pay | Admitting: Cardiovascular Disease

## 2021-04-24 DIAGNOSIS — I7122 Aneurysm of the aortic arch, without rupture: Secondary | ICD-10-CM

## 2021-04-24 DIAGNOSIS — C44629 Squamous cell carcinoma of skin of left upper limb, including shoulder: Secondary | ICD-10-CM | POA: Diagnosis not present

## 2021-04-24 NOTE — Telephone Encounter (Signed)
Patient was calling to get his CT scheduled. Please advise

## 2021-04-24 NOTE — Telephone Encounter (Signed)
Spoke with pt, I gave him the number to [facility] to call and schedule CTA.

## 2021-04-25 ENCOUNTER — Telehealth (HOSPITAL_COMMUNITY): Payer: Self-pay

## 2021-04-25 ENCOUNTER — Telehealth: Payer: Self-pay | Admitting: *Deleted

## 2021-04-25 NOTE — Telephone Encounter (Signed)
Pt called stating when he last saw Dr. Tamala Julian 05/2020 he was told to call back when he wanted to move forward with an epidural injection. Pt has now called back & would like an injection. Since its been a year does he need to be seen first?

## 2021-04-25 NOTE — Telephone Encounter (Signed)
3 slots came available for cardiac rehab, attempted to call pt to see if he wanted to schedule, advised pt on the VM that the slots are going to go pretty quick and that there is no guarantee that the slots will be available when he calls back.

## 2021-04-26 ENCOUNTER — Telehealth: Payer: Self-pay | Admitting: Cardiovascular Disease

## 2021-04-26 NOTE — Telephone Encounter (Signed)
Troy Sine, MD  04/26/2021 12:00 PM EST     Calcium minimally increased to 10.5.  Consider checking parathyroid hormone.  Renal function stable.    Spoke with pt, aware results forwarded to his medical doctor.

## 2021-04-26 NOTE — Telephone Encounter (Signed)
Patient is returning call to discuss lab results. 

## 2021-04-29 NOTE — Telephone Encounter (Signed)
Spoke to pt & scheduled appt for 11/17.

## 2021-04-29 NOTE — Telephone Encounter (Signed)
Lmovm for pt to return call.

## 2021-04-29 NOTE — Telephone Encounter (Signed)
I think we do need to see him He has had a MI since we saw him and also I do not see where I order the epidural, maybe I am missing it

## 2021-05-01 NOTE — Progress Notes (Signed)
[name] Phone: [phone] Subjective:   Bryan [name], am serving as a scribe for Dr. Hulan Saas.  This visit occurred during the SARS-CoV-2 public health emergency.  Safety protocols were in place, including screening questions prior to the visit, additional usage of staff PPE, and extensive cleaning of exam room while observing appropriate contact time as indicated for disinfecting solutions.   I'm seeing this patient by the request  of:  Caren Macadam, MD  CC: Low back pain and pelvic pain follow-up  [id number]  05/29/2020 Patient does have the lumbar spinal stenosis.  Patient does have the hip replacement.  The seem to be giving her more the discomfort.  We discussed with patient in great length.  Patient states that the pain is controlled with the tramadol as well as with the Tylenol.  Not taking other medicines regularly.  Patient is doing well with this and will continue at this time to continue the management.  Did not want to try anything more aggressive.  MRIs have ruled out anything else and we did discuss the epidurals in the back otherwise.  Patient can follow-up with me as needed  Update 05/02/2021 Bryan [name] is a 75 y.o. male coming in with complaint of lumbar spine pain. Patient states that his back pain is the same as last visit. Also having pain in B hips. Patient states that he was ready to get an injection but his pain has improved so he would like to wait. Using Tylenol, Tramadol, gabapentin. Zanaflex prn. [name] longer takes Meloxicam due to heart attack on August 4th.  Patient states that he is back in the gym now.  He states that as long as he stays active the back pain seems to get better.  Increasing significant tightness that was noted in the morning.  MRI lumbar 02/19/2020 IMPRESSION: 1. Interval L2-L5 fusion without residual compressive stenosis. 2. Progressive adjacent segment  disease at L1-2 with mild spinal stenosis and moderate lateral recess stenosis. 3. Progressive, moderate bilateral neural foraminal stenosis at L5-S1.       Past Medical History:  Diagnosis Date   Anxiety    Cancer ([place])    facial skin cancer   Depression    GERD (gastroesophageal reflux disease)    takes tums rarely   Hematuria    1 time in july 2019   Hypertension    Osteoarthritis    Spinal stenosis    ST elevation myocardial infarction involving right coronary artery [facility])    Past Surgical History:  Procedure Laterality Date   ACHILLES TENDON SURGERY  02/2006   ANTERIOR LAT LUMBAR FUSION N/A 11/11/2017   Procedure: LUMBAR TWO- LUMBAR THREE, LUMBAR THREE- LUMBAR FOUR, LUMBAR FOUR- LUMBAR FIVE ANTERIOLATERAL INTERBODY ARTHRODESIS;  Surgeon: Jovita Gamma, MD;  Location: [facility];  Service: Neurosurgery;  Laterality: N/A;   COLONOSCOPY  2003   COLONOSCOPY     CORONARY STENT INTERVENTION N/A 01/17/2021   Procedure: CORONARY STENT INTERVENTION;  Surgeon: Troy Sine, MD;  Location: [facility];  Service: Cardiovascular;  Laterality: N/A;   CORONARY/GRAFT ACUTE MI REVASCULARIZATION N/A 01/17/2021   Procedure: Coronary/Graft Acute MI Revascularization;  Surgeon: Troy Sine, MD;  Location: [facility];  Service: Cardiovascular;  Laterality: N/A;   CYSTOSCOPY N/A 05/21/2018   Procedure: CYSTOSCOPY FLEXIBLE;  Surgeon: Festus Aloe, MD;  Location: [facility];  Service: Urology;  Laterality: N/A;   KNEE  SURGERY Left 1991 and 2001   X2 arthroscopy   LEFT HEART CATH AND CORONARY ANGIOGRAPHY N/A 01/17/2021   Procedure: LEFT HEART CATH AND CORONARY ANGIOGRAPHY;  Surgeon: Troy Sine, MD;  Location: [facility];  Service: Cardiovascular;  Laterality: N/A;   LUMBAR PERCUTANEOUS PEDICLE SCREW 3 LEVEL N/A 11/11/2017   Procedure: LUMBAR PERCUTANEOUS PEDICLE SCREW PLACEMENT LUMBAR TWO-THREE, LUMBER THREE-FOUR, LUMBAR FOUR-FIVE;  Surgeon: Jovita Gamma, MD;  Location: [facility];  Service: Neurosurgery;  Laterality: N/A;   SPINE SURGERY  12-08, 5-10   TOTAL HIP ARTHROPLASTY     oct 2006,   TOTAL HIP ARTHROPLASTY Right 02/09/2018   Procedure: RIGHT TOTAL HIP ARTHROPLASTY ANTERIOR APPROACH;  Surgeon: Paralee Cancel, MD;  Location: [facility];  Service: Orthopedics;  Laterality: Right;   Social History   Socioeconomic History   Marital status: Married    Spouse name: Not on file   Number of children: Not on file   Years of education: Not on file   Highest education level: Not on file  Occupational History   Not on file  Tobacco Use   Smoking status: Never   Smokeless tobacco: Never  Vaping Use   Vaping Use: Never used  Substance and Sexual Activity   Alcohol use: [name]   Drug use: [name]   Sexual activity: Not on file  Other Topics Concern   Not on file  Social History Narrative   Divorced, Exercises regularly   Social Determinants of Health   Financial Resource Strain: Low Risk    Difficulty of Paying Living Expenses: Not hard at all  Food Insecurity: [name] Food Insecurity   Worried About Charity fundraiser in the Last Year: Never true   [place] in the Last Year: Never true  Transportation Needs: [name] Transportation Needs   Lack of Transportation (Medical): [name]   Lack of Transportation (Non-Medical): [name]  Physical Activity: Sufficiently Active   Days of Exercise per Week: 5 days   Minutes of Exercise per Session: 30 min  Stress: [name] Stress Concern Present   Feeling of Stress : Not at all  Social Connections: Moderately Isolated   Frequency of Communication with Friends and Family: More than three times a week   Frequency of Social Gatherings with Friends and Family: More than three times a week   Attends Religious Services: Never   Marine scientist or Organizations: [name]   Attends Music therapist: Never   Marital Status: Married   Allergies  Allergen Reactions   Codeine Other (See Comments)    "made me  sick"   Family History  Problem Relation Age of Onset   Diabetes Mother    Coronary artery disease Mother 1   Hypertension Mother    Other Father 73   Colon polyps Neg Hx    Esophageal cancer Neg Hx    Rectal cancer Neg Hx    Stomach cancer Neg Hx      Current Outpatient Medications (Cardiovascular):    losartan (COZAAR) 25 MG tablet, Take 50mg  in the morning and 25mg  in the evening.   metoprolol succinate (TOPROL-XL) 25 MG 24 hr tablet, Take 1 tablet (25 mg total) by mouth daily.   nitroGLYCERIN (NITROSTAT) 0.4 MG SL tablet, Place 1 tablet (0.4 mg total) under the tongue every 5 (five) minutes as needed for chest pain.   rosuvastatin (CRESTOR) 40 MG tablet, Take 1 tablet (40 mg total) by mouth daily.   Current Outpatient Medications (  Analgesics):    acetaminophen (TYLENOL) 500 MG tablet, Take 500 mg by mouth daily as needed for moderate pain.   aspirin 81 MG chewable tablet, Chew 1 tablet (81 mg total) by mouth daily.   traMADol (ULTRAM) 50 MG tablet, Take 1 tablet (50 mg total) by mouth every 6 (six) hours as needed for moderate pain.  Current Outpatient Medications (Hematological):    ticagrelor (BRILINTA) 90 MG TABS tablet, Take 1 tablet (90 mg total) by mouth 2 (two) times daily.   vitamin B-12 (CYANOCOBALAMIN) 1000 MCG tablet, Take 1,000 mcg by mouth daily.  Current Outpatient Medications (Other):    calcium carbonate (TUMS - DOSED IN MG ELEMENTAL CALCIUM) 500 MG chewable tablet, Chew 2 tablets by mouth daily as needed for indigestion or heartburn.   Cholecalciferol (VITAMIN D3) 2000 units TABS, Take 2,000 Units by mouth daily.   gabapentin (NEURONTIN) 100 MG capsule, TAKE TWO CAPSULES AT BEDTIME. (Patient taking differently: Take 200 mg by mouth at bedtime.)   Multiple Vitamins-Minerals (HAIR SKIN AND NAILS FORMULA PO), Take 1 tablet by mouth daily.   Omega-3 Fatty Acids (FISH OIL) 1200 MG CAPS, Take 1 capsule (1,200 mg total) by mouth daily.   tiZANidine (ZANAFLEX) 4 MG  tablet, Take 1 tablet (4 mg total) by mouth every 8 (eight) hours as needed for muscle spasms.   vitamin C (ASCORBIC ACID) 250 MG tablet, Take 250 mg by mouth daily.   Reviewed prior external information including notes and imaging from  primary care provider As well as notes that were available from care everywhere and other healthcare systems.  Past medical history, social, surgical and family history all reviewed in electronic medical record.  [name] pertanent information unless stated regarding to the chief complaint.   Review of Systems:  [name] headache, visual changes, nausea, vomiting, diarrhea, constipation, dizziness, abdominal pain, skin rash, fevers, chills, night sweats, weight loss, swollen lymph nodes, body aches, joint swelling, chest pain, shortness of breath, mood changes. POSITIVE muscle aches  Objective  Blood pressure 124/82, pulse 62, height 6\' 4"  (1.93 m), weight 204 lb (92.5 kg), SpO2 97 %.   General: [name] apparent distress alert and oriented x3 mood and affect normal, dressed appropriately.  HEENT: Pupils equal, extraocular movements intact  Respiratory: Patient's speak in full sentences and does not appear short of breath  Cardiovascular: [name] lower extremity edema, non tender, [name] erythema  Gait normal with good balance and coordination.  MSK: Patient is able to get out of the chair without any significant discomfort today.  Patient does have some tenderness to palpation of the paraspinal musculature.  Tightness noted with FABER test bilaterally. Seems to be neurovascularly intact distally.   Impression and Recommendations:    The above documentation has been reviewed and is accurate and complete Lyndal Pulley, DO

## 2021-05-02 ENCOUNTER — Telehealth: Payer: Self-pay | Admitting: Family Medicine

## 2021-05-02 ENCOUNTER — Encounter: Payer: Self-pay | Admitting: Family Medicine

## 2021-05-02 ENCOUNTER — Other Ambulatory Visit: Payer: Self-pay

## 2021-05-02 ENCOUNTER — Ambulatory Visit: Payer: Medicare Other | Admitting: Family Medicine

## 2021-05-02 DIAGNOSIS — M48062 Spinal stenosis, lumbar region with neurogenic claudication: Secondary | ICD-10-CM | POA: Diagnosis not present

## 2021-05-02 NOTE — Assessment & Plan Note (Signed)
Patient does have lumbar stenosis with neurogenic claudication.  Still has intermittent pain.  Seems to be more tightness.  Still the things such as standing causes more discomfort.  Discussed gabapentin and will continue to prescribe at 200 mg.  Discussed with patient about the muscle relaxers.  Patient does get tramadol from his primary care provider.  In addition to this we discussed how epidurals could be beneficial with patient having the adjacent segment disease at L1-L2 as well as talked about the L5-S1 possible epidurals as well.  Depending on his symptoms it seems to be more secondary to the L5-S1 at this moment.  Patient will continue to stay active and will follow-up with me on an as-needed basis.  Total time with patient today greater than 32 minutes.

## 2021-05-02 NOTE — Telephone Encounter (Signed)
Per a verbal from Dr. Tamala Julian he recommends Cymbalta.

## 2021-05-02 NOTE — Patient Instructions (Signed)
Glad you are doing well Continue gabapentin Write [place] when you think you need injection Here if you need me Stay active!

## 2021-05-02 NOTE — Telephone Encounter (Signed)
Patient called asking what pain medication Dr Tamala Julian mentioned would be a potential option down the road? It was not noted in his appointment notes.

## 2021-05-06 ENCOUNTER — Encounter (HOSPITAL_COMMUNITY): Payer: Self-pay

## 2021-05-06 NOTE — Telephone Encounter (Signed)
Attempted to call patient in regards to Cardiac Rehab - LM on VM Mailed letter 

## 2021-05-07 ENCOUNTER — Telehealth (HOSPITAL_COMMUNITY): Payer: Self-pay

## 2021-05-07 NOTE — Telephone Encounter (Signed)
Patient called and was interested in participating in the Cardiac Rehab Program. Patient will come in for orientation on 05/30/2021@1 :15pm and will attend the 3:00pm exercise class.  Tourist information centre manager.

## 2021-05-13 ENCOUNTER — Other Ambulatory Visit: Payer: Self-pay | Admitting: Family Medicine

## 2021-05-16 ENCOUNTER — Ambulatory Visit
Admission: RE | Admit: 2021-05-16 | Discharge: 2021-05-16 | Disposition: A | Discharge status: Home / Self Care | Payer: Medicare Other | Source: Ambulatory Visit | Attending: Cardiovascular Disease | Admitting: Cardiovascular Disease

## 2021-05-16 ENCOUNTER — Other Ambulatory Visit: Payer: Self-pay

## 2021-05-16 DIAGNOSIS — R911 Solitary pulmonary nodule: Secondary | ICD-10-CM | POA: Diagnosis not present

## 2021-05-16 DIAGNOSIS — I712 Thoracic aortic aneurysm, without rupture, unspecified: Secondary | ICD-10-CM | POA: Diagnosis not present

## 2021-05-16 DIAGNOSIS — I700 Atherosclerosis of aorta: Secondary | ICD-10-CM | POA: Diagnosis not present

## 2021-05-16 DIAGNOSIS — I7122 Aneurysm of the aortic arch, without rupture: Secondary | ICD-10-CM

## 2021-05-16 MED ORDER — IOPAMIDOL (ISOVUE-370) INJECTION 76%
75.0000 mL | Freq: Once | INTRAVENOUS | Status: AC | PRN
  Administered 2021-05-16: 75 mL via INTRAVENOUS

## 2021-05-21 ENCOUNTER — Telehealth: Payer: Self-pay | Admitting: Cardiovascular Disease

## 2021-05-21 ENCOUNTER — Other Ambulatory Visit: Payer: Self-pay

## 2021-05-21 DIAGNOSIS — I7121 Aneurysm of the ascending aorta, without rupture: Secondary | ICD-10-CM

## 2021-05-21 NOTE — Telephone Encounter (Signed)
Patient was returning call for results. Please advise °

## 2021-05-21 NOTE — Telephone Encounter (Signed)
Called pt. Made aware of his CT scan results. Verbalized understanding. No questions expressed at this time.

## 2021-05-23 ENCOUNTER — Telehealth: Payer: Self-pay

## 2021-05-23 NOTE — Telephone Encounter (Signed)
Pt called back in regards to his CT results. He was not sure what terminology I used when we spoke the other day. He wrote down the name and will keep his appt in Jan to see Dr. Claiborne Billings.

## 2021-05-29 NOTE — Telephone Encounter (Signed)
Pt called and stating that he no longer wants to participate in cardiac rehab. He states that he goes to the gym everyday and that he doesn't feel as if he needs it anymore. Canceled pt orientation and sessions. Closed referral.

## 2021-05-30 ENCOUNTER — Inpatient Hospital Stay (HOSPITAL_COMMUNITY): Admission: RE | Admit: 2021-05-30 | Payer: Medicare Other | Source: Ambulatory Visit

## 2021-06-03 ENCOUNTER — Ambulatory Visit (HOSPITAL_COMMUNITY): Payer: Medicare Other

## 2021-06-05 ENCOUNTER — Ambulatory Visit (HOSPITAL_COMMUNITY): Payer: Medicare Other

## 2021-06-07 ENCOUNTER — Ambulatory Visit (HOSPITAL_COMMUNITY): Payer: Medicare Other

## 2021-06-12 ENCOUNTER — Ambulatory Visit (HOSPITAL_COMMUNITY): Payer: Medicare Other

## 2021-06-14 ENCOUNTER — Ambulatory Visit (HOSPITAL_COMMUNITY): Payer: Medicare Other

## 2021-06-19 ENCOUNTER — Ambulatory Visit (HOSPITAL_COMMUNITY): Payer: Medicare Other

## 2021-06-21 ENCOUNTER — Encounter: Payer: Self-pay | Admitting: Family Medicine

## 2021-06-21 ENCOUNTER — Ambulatory Visit (INDEPENDENT_AMBULATORY_CARE_PROVIDER_SITE_OTHER): Payer: Medicare Other | Admitting: Family Medicine

## 2021-06-21 ENCOUNTER — Ambulatory Visit (HOSPITAL_COMMUNITY): Payer: Medicare Other

## 2021-06-21 VITALS — BP 102/60 | HR 64 | Temp 98.0°F | Ht 76.0 in | Wt 208.9 lb

## 2021-06-21 DIAGNOSIS — I2510 Atherosclerotic heart disease of native coronary artery without angina pectoris: Secondary | ICD-10-CM

## 2021-06-21 DIAGNOSIS — E538 Deficiency of other specified B group vitamins: Secondary | ICD-10-CM

## 2021-06-21 DIAGNOSIS — E559 Vitamin D deficiency, unspecified: Secondary | ICD-10-CM | POA: Diagnosis not present

## 2021-06-21 DIAGNOSIS — E785 Hyperlipidemia, unspecified: Secondary | ICD-10-CM | POA: Diagnosis not present

## 2021-06-21 DIAGNOSIS — I10 Essential (primary) hypertension: Secondary | ICD-10-CM | POA: Diagnosis not present

## 2021-06-21 DIAGNOSIS — Z23 Encounter for immunization: Secondary | ICD-10-CM

## 2021-06-21 DIAGNOSIS — M48062 Spinal stenosis, lumbar region with neurogenic claudication: Secondary | ICD-10-CM | POA: Diagnosis not present

## 2021-06-21 LAB — CBC WITH DIFFERENTIAL/PLATELET
Basophils Absolute: 0 10*3/uL (ref 0.0–0.1)
Basophils Relative: 0.5 % (ref 0.0–3.0)
Eosinophils Absolute: 0.2 10*3/uL (ref 0.0–0.7)
Eosinophils Relative: 3.2 % (ref 0.0–5.0)
HCT: 50.6 % (ref 39.0–52.0)
Hemoglobin: 16.5 g/dL (ref 13.0–17.0)
Lymphocytes Relative: 15.7 % (ref 12.0–46.0)
Lymphs Abs: 1 10*3/uL (ref 0.7–4.0)
MCHC: 32.7 g/dL (ref 30.0–36.0)
MCV: 90.7 fl (ref 78.0–100.0)
Monocytes Absolute: 0.5 10*3/uL (ref 0.1–1.0)
Monocytes Relative: 7.9 % (ref 3.0–12.0)
Neutro Abs: 4.7 10*3/uL (ref 1.4–7.7)
Neutrophils Relative %: 72.7 % (ref 43.0–77.0)
Platelets: 221 10*3/uL (ref 150.0–400.0)
RBC: 5.57 Mil/uL (ref 4.22–5.81)
RDW: 14.5 % (ref 11.5–15.5)
WBC: 6.5 10*3/uL (ref 4.0–10.5)

## 2021-06-21 LAB — COMPREHENSIVE METABOLIC PANEL
ALT: 21 U/L (ref 0–53)
AST: 26 U/L (ref 0–37)
Albumin: 4.4 g/dL (ref 3.5–5.2)
Alkaline Phosphatase: 49 U/L (ref 39–117)
BUN: 22 mg/dL (ref 6–23)
CO2: 27 mEq/L (ref 19–32)
Calcium: 10 mg/dL (ref 8.4–10.5)
Chloride: 104 mEq/L (ref 96–112)
Creatinine, Ser: 1.09 mg/dL (ref 0.40–1.50)
GFR: 67.55 mL/min (ref >60.00)
Glucose, Bld: 73 mg/dL (ref 70–99)
Potassium: 4.2 mEq/L (ref 3.5–5.1)
Sodium: 140 mEq/L (ref 135–145)
Total Bilirubin: 1.5 mg/dL — ABNORMAL HIGH (ref 0.2–1.2)
Total Protein: 7 g/dL (ref 6.0–8.3)

## 2021-06-21 LAB — FOLATE: Folate: >24.2 ng/mL (ref >5.9)

## 2021-06-21 LAB — VITAMIN D 25 HYDROXY (VIT D DEFICIENCY, FRACTURES): VITD: 39.93 ng/mL (ref 30.00–100.00)

## 2021-06-21 LAB — VITAMIN B12: Vitamin B-12: 361 pg/mL (ref 211–911)

## 2021-06-21 NOTE — Progress Notes (Signed)
Bryan [name] DOB: 1946-03-08 Encounter date: 06/21/2021  This is a 76 y.o. male who presents with Chief Complaint  Patient presents with   Follow-up    History of present illness: Last visit with me was 12/19/2020 for complete physical. In august(8/4) he was admitted to hospital with STEMI and had RCA stenting. Started getting sick, nauseated, sweating. Called 911. Cardiology recommended lipomed panel.bp medication changed to toprol xl and low dose cozor due to LVEF. Mobic stopped since he is on asa and bilinta. Follow up with cardio10/28 losartan increased to 50mg  in morning, 25mg  in evening. They are following aortic aneurysm as well with regular imaging.he has next cardio visit 1/23 with Dr.Kelly.   Hypertension: losartan 50mg  in morning, 25mg  in evening. Has been checking at home- typically around 120's/80's. Thinks low pressure is related to muscle relaxer.   Chronic lowback/hip pain: tramadol  Swimming regularly still. Still doesn't feel that breathing is fully back yet. Still doing everything that he was doing before and swimming more. Energy level is good. Appetite is good.  HL: crestor 40mg  daily  Allergies  Allergen Reactions   Codeine Other (See Comments)    "made me sick"   Current Meds  Medication Sig   acetaminophen (TYLENOL) 500 MG tablet Take 500 mg by mouth daily as needed for moderate pain.   aspirin 81 MG chewable tablet Chew 1 tablet (81 mg total) by mouth daily.   calcium carbonate (TUMS - DOSED IN MG ELEMENTAL CALCIUM) 500 MG chewable tablet Chew 2 tablets by mouth daily as needed for indigestion or heartburn.   Cholecalciferol (VITAMIN D3) 2000 units TABS Take 2,000 Units by mouth daily.   gabapentin (NEURONTIN) 100 MG capsule TAKE TWO CAPSULES AT BEDTIME. (Patient taking differently: Take 200 mg by mouth at bedtime.)   losartan (COZAAR) 25 MG tablet Take 50mg  in the morning and 25mg  in the evening.   Menthol, Topical Analgesic, (BIOFREEZE) 4 % GEL Apply 1  application topically daily as needed (pain).   metoprolol succinate (TOPROL-XL) 25 MG 24 hr tablet Take 1 tablet (25 mg total) by mouth daily.   Multiple Vitamins-Minerals (HAIR SKIN AND NAILS FORMULA PO) Take 1 tablet by mouth daily.   nitroGLYCERIN (NITROSTAT) 0.4 MG SL tablet Place 1 tablet (0.4 mg total) under the tongue every 5 (five) minutes as needed for chest pain.   Omega-3 Fatty Acids (FISH OIL) 1200 MG CAPS Take 1 capsule (1,200 mg total) by mouth daily.   rosuvastatin (CRESTOR) 40 MG tablet Take 1 tablet (40 mg total) by mouth daily.   ticagrelor (BRILINTA) 90 MG TABS tablet Take 1 tablet (90 mg total) by mouth 2 (two) times daily.   tiZANidine (ZANAFLEX) 4 MG tablet Take 1 tablet (4 mg total) by mouth every 8 (eight) hours as needed for muscle spasms.   traMADol (ULTRAM) 50 MG tablet Take 1 tablet (50 mg total) by mouth every 6 (six) hours as needed for moderate pain.   vitamin B-12 (CYANOCOBALAMIN) 1000 MCG tablet Take 1,000 mcg by mouth daily.   vitamin C (ASCORBIC ACID) 250 MG tablet Take 250 mg by mouth daily.    Review of Systems  Constitutional:  Negative for chills, fatigue and fever.  Respiratory:  Negative for cough, chest tightness, shortness of breath and wheezing.   Cardiovascular:  Negative for chest pain, palpitations and leg swelling.  Musculoskeletal:  Positive for back pain.   Objective:  BP 102/60 (BP Location: Left Arm, Patient Position: Sitting, Cuff Size: Large)  Pulse 64    Temp 98 F (36.7 C) (Oral)    Ht 6\' 4"  (1.93 m)    Wt 208 lb 14.4 oz (94.8 kg)    SpO2 97%    BMI 25.43 kg/m   Weight: 208 lb 14.4 oz (94.8 kg)   BP Readings from Last 3 Encounters:  06/21/21 102/60  05/02/21 124/82  04/12/21 128/80   Wt Readings from Last 3 Encounters:  06/21/21 208 lb 14.4 oz (94.8 kg)  05/02/21 204 lb (92.5 kg)  04/12/21 203 lb 9.6 oz (92.4 kg)    Physical Exam Constitutional:      General: He is not in acute distress.    Appearance: He is  well-developed.  Cardiovascular:     Rate and Rhythm: Normal rate and regular rhythm.     Heart sounds: Normal heart sounds. No murmur heard.   No friction rub.  Pulmonary:     Effort: Pulmonary effort is normal. No respiratory distress.     Breath sounds: Normal breath sounds. No wheezing or rales.  Musculoskeletal:     Right lower leg: No edema.     Left lower leg: No edema.  Neurological:     Mental Status: He is alert and oriented to person, place, and time.  Psychiatric:        Behavior: Behavior normal.    Assessment/Plan 1. Essential hypertension Well controlled. Advised continuing to check at home and bring recordings to cardiology at follow up.  - Comprehensive metabolic panel; Future - CBC with Differential/Platelet; Future  2. Lumbar stenosis with neurogenic claudication Ultram asneeded. Keep up with exercise.   3. B12 deficiency - Vitamin B12; Future - Folate; Future  4. Vitamin D deficiency - VITAMIN D 25 Hydroxy (Vit-D Deficiency, Fractures); Future  5. Hyperlipidemia, unspecified hyperlipidemia type Crestor 40mg .  - NMR, lipoprofile; Future - Comprehensive metabolic panel; Future  6. Coronary artery disease involving native heart without angina pectoris, unspecified vessel or lesion type Following with cardiology. Recheck lipids today, continue with good bp control.   7. Hypercalcemia - Comprehensive metabolic panel; Future - PTH, Intact and Calcium; Future     Return in about 6 months (around 12/19/2021) for physical exam.    Micheline Rough, MD

## 2021-06-21 NOTE — Addendum Note (Signed)
Addended by: Agnes Lawrence on: 06/21/2021 09:22 AM   Modules accepted: Orders

## 2021-06-23 LAB — NMR, LIPOPROFILE
Cholesterol, Total: 106 mg/dL (ref 100–199)
HDL Particle Number: 32.5 umol/L (ref >=30.5)
HDL-C: 54 mg/dL (ref >39)
LDL Particle Number: 430 nmol/L (ref <1000)
LDL Size: 20 nm — ABNORMAL LOW (ref >20.5)
LDL-C (NIH Calc): 34 mg/dL (ref 0–99)
LP-IR Score: 27 (ref <=45)
Small LDL Particle Number: 246 nmol/L (ref <=527)
Triglycerides: 96 mg/dL (ref 0–149)

## 2021-06-24 ENCOUNTER — Ambulatory Visit (HOSPITAL_COMMUNITY): Payer: Medicare Other

## 2021-06-24 LAB — PTH, INTACT AND CALCIUM
Calcium: 9.8 mg/dL (ref 8.6–10.3)
PTH: 32 pg/mL (ref 16–77)

## 2021-06-26 ENCOUNTER — Ambulatory Visit (HOSPITAL_COMMUNITY): Payer: Medicare Other

## 2021-06-28 ENCOUNTER — Ambulatory Visit (HOSPITAL_COMMUNITY): Payer: Medicare Other

## 2021-06-28 ENCOUNTER — Telehealth: Payer: Self-pay | Admitting: Family Medicine

## 2021-06-28 NOTE — Telephone Encounter (Signed)
Patient called back because he needs his lab results again. He said that he was around a lot of people and he would like to write them down for his records. Patient would like another call back for those results.      Please advise

## 2021-07-01 ENCOUNTER — Ambulatory Visit (HOSPITAL_COMMUNITY): Payer: Medicare Other

## 2021-07-01 NOTE — Telephone Encounter (Signed)
I left a detailed message with the results at the patient's voicemail.

## 2021-07-03 ENCOUNTER — Ambulatory Visit (HOSPITAL_COMMUNITY): Payer: Medicare Other

## 2021-07-05 ENCOUNTER — Ambulatory Visit (HOSPITAL_COMMUNITY): Payer: Medicare Other

## 2021-07-08 ENCOUNTER — Other Ambulatory Visit: Payer: Self-pay

## 2021-07-08 ENCOUNTER — Encounter: Payer: Self-pay | Admitting: Cardiovascular Disease

## 2021-07-08 ENCOUNTER — Ambulatory Visit (HOSPITAL_COMMUNITY): Payer: Medicare Other

## 2021-07-08 ENCOUNTER — Ambulatory Visit: Payer: Medicare Other | Admitting: Cardiovascular Disease

## 2021-07-08 VITALS — BP 122/80 | HR 66 | Ht 76.0 in | Wt 206.4 lb

## 2021-07-08 DIAGNOSIS — I2111 ST elevation (STEMI) myocardial infarction involving right coronary artery: Secondary | ICD-10-CM | POA: Diagnosis not present

## 2021-07-08 DIAGNOSIS — E785 Hyperlipidemia, unspecified: Secondary | ICD-10-CM

## 2021-07-08 DIAGNOSIS — I255 Ischemic cardiomyopathy: Secondary | ICD-10-CM

## 2021-07-08 DIAGNOSIS — Z8719 Personal history of other diseases of the digestive system: Secondary | ICD-10-CM

## 2021-07-08 DIAGNOSIS — Q2549 Other congenital malformations of aorta: Secondary | ICD-10-CM | POA: Diagnosis not present

## 2021-07-08 NOTE — Patient Instructions (Signed)
Medication Instructions:  NOT CHANGES  *If you need a refill on your cardiac medications before your next appointment, please call your pharmacy*   Lab Work: NOT NEEDED If you have labs (blood work) drawn today and your tests are completely normal, you will receive your results only by: [facility] (if you have MyChart) OR A paper copy in the mail If you have any lab test that is abnormal or we need to change your treatment, we will call you to review the results.   Testing/Procedures:  NOT NEEDED  Follow-Up: At [facility], you and your health needs are our priority.  As part of our continuing mission to provide you with exceptional heart care, we have created designated Provider Care Teams.  These Care Teams include your primary Cardiologist (physician) and Advanced Practice Providers (APPs -  Physician Assistants and Nurse Practitioners) who all work together to provide you with the care you need, when you need it.     Your next appointment:   6 month(s)  The format for your next appointment:   In Person  Provider:   Shelva Majestic, MD

## 2021-07-10 ENCOUNTER — Ambulatory Visit (HOSPITAL_COMMUNITY): Payer: Medicare Other

## 2021-07-12 ENCOUNTER — Ambulatory Visit (HOSPITAL_COMMUNITY): Payer: Medicare Other

## 2021-07-15 ENCOUNTER — Ambulatory Visit (HOSPITAL_COMMUNITY): Payer: Medicare Other

## 2021-07-15 ENCOUNTER — Encounter: Payer: Self-pay | Admitting: Cardiovascular Disease

## 2021-07-15 NOTE — Progress Notes (Signed)
Cardiology Office Note    Date:  07/15/2021   ID:  Bryan [name], DOB [birth year], MRN [id number]  PCP:  Caren Macadam, MD  Cardiologist:  Shelva Majestic, MD   2 month F/U office visit   History of Present Illness:  Bryan [name] is a 76 y.o. male who who I saw for his initial office evaluation on February 05, 2021 following his inferior STEMI.  I last saw him April 12, 2021.  He presents for 67-monthfollow-up evaluation.  Mr. BPodolskyhas a history of hypertension, hyperlipidemia, spinal stenosis and GERD.  He has remained fairly healthy and had exercised regularly swimming almost every day and playing golf multiple times per week.  He apparently has had issues with some anxiety.  On January 17, 2021 he had seen his CPA in the morning.  Upon going home he began to notice substernal chest tightness at around 10:30 AM associated with shortness of breath and nausea.  Symptoms initially resolved but ultimately symptoms returned.  EMS was called and he was found to have inferior STEMI on ECG.  Code [facility] activated [facility].  I performed emergent cardiac catheterization which demonstrated his acute ST segment elevation of his inferior wall secondary to mid-distal thrombotic occlusion of a large dominant calcified RCA.  There was no significant obstruction in the left coronary circulation.  His intervention was very difficult but ultimately successful.  There were significant catheter backup difficulties with his calcified and tortuous vessel needing GuideLiner support for ultimate stenting near the acute margin with a 3.5 x 15 mm Medtronic Onyx frontier stent postdilated to 3.8 mm with 100% occlusion being reduced to 0%.  He also had 80% proximal RCA stenosis which was successfully stented with a 3.5 x 18 mm stent postdilated to 3.85 mm.  He had additional diffuse irregularity in the RCA of 4030% in the proximal and  distal segment with 60% stenosis in the PLA vessel.  There was thrombotic occlusion of the very distal portion of the PDA vessel for which Aggrenox was initiated.  A subsequent echo Doppler study on January 18, 2021 showed an ejection fraction reduced at 35 to 40%.  His hospital course was uneventful and he was discharged on January 19, 2021.  Prior to admission he was on lisinopril and hydrochlorothiazide as an outpatient for hypertension.  He was treated with beta-blocker therapy and currently is now on aspirin/Brilinta for DAPT, losartan 25 mg in addition to metoprolol succinate 25 mg daily post MI.  He is on rosuvastatin 40 mg and fish oil antiplatelet therapy.  I saw him for his initial post hospital office evaluation with me on February 05, 2021.  Since hospital discharge she had felt well and denied any chest pain or shortness of breath.  He denied any palpitations, presyncope or syncope.  Previously he had gone to the pool to swim almost on a daily basis to do exercises.  He was anxious to get back to the pool.  During that evaluation, with his EF of 35 to 40% I recommended titration of losartan from 25 mg daily to 25 mg twice a day.  He continues to be on metoprolol succinate 25 mg daily.  I recommended that he have a follow-up echo Doppler study in several months and if LV remains depressed at that time I [facility].  He continues to be on  rosuvastatin 40 mg.  LDL cholesterol in August for was 66.  His ECG showed sinus rhythm with first-degree AV block with inferior Q waves and T wave inversion.  Bryan [name] underwent his follow-up echo Doppler study March 26, 2021.  LV function had improved and was now 45 to 50% with only mild inferior posterior hypokinesis.  He had normal RV systolic function and normal PA pressures.  Of note, on his prior echo Doppler study his ascending aorta measured 45 mm, but on this echo it was measured as 49  mm.  I last saw Bryan [name] on April 12, 2021.  At that time he continued to feel well.    He admited that he is a very nervous gentleman and gets anxious often.  He is back at the pool and doing exercises and swimming and also was playing golf without chest pain.  He denied any palpitations.  During that evaluation, I reviewed his most recent echo Doppler study which showed significant improvement in his LV function.  With his improvement in class I symptoms I did not initiate Entresto but recommended further titration of losartan to 50 mg in the morning and 25 mg in the evening and recommend he continue metoprolol succinate 50 mg daily.  On his recent echo Doppler there was suggestion of progressive a sending aortic dilatation at 49 mm.  I recommended he undergo CT angio of his aortic for more definitive evaluation.  He continued to be on rosuvastatin 40 mg for hyperlipidemia and was maintained on DAPT.  Over the past several months, Bryan [name] has felt well.  On May 16, 2021 he underwent CT angio of his chest and aorta which showed only mild dilation of his sinus of Valsalva measuring 45 mm and the remainder of his thoracic aorta was normal in caliber.  He was noted to have an 8 mm right lower lobe pulmonary nodule and it was recommended that noncontrast chest CT at 6 to 12 months be undertaken.  Presently, Bryan [name] remains asymptomatic without chest pain exertional dyspnea and remains active.  He does have arthritic symptoms and is status post bilateral hip replacements.  He presents for reevaluation.   Past Medical History:  Diagnosis Date   Anxiety    Cancer ([place])    facial skin cancer   Depression    GERD (gastroesophageal reflux disease)    takes tums rarely   Hematuria    1 time in july 2019   Hypertension    Osteoarthritis    Spinal stenosis    ST elevation myocardial infarction involving right coronary artery [facility])     Past Surgical History:  Procedure Laterality Date    ACHILLES TENDON SURGERY  02/2006   ANTERIOR LAT LUMBAR FUSION N/A 11/11/2017   Procedure: LUMBAR TWO- LUMBAR THREE, LUMBAR THREE- LUMBAR FOUR, LUMBAR FOUR- LUMBAR FIVE ANTERIOLATERAL INTERBODY ARTHRODESIS;  Surgeon: Jovita Gamma, MD;  Location: [facility];  Service: Neurosurgery;  Laterality: N/A;   COLONOSCOPY  2003   COLONOSCOPY     CORONARY STENT INTERVENTION N/A 01/17/2021   Procedure: CORONARY STENT INTERVENTION;  Surgeon: Troy Sine, MD;  Location: [place] [facility];  Service: Cardiovascular;  Laterality: N/A;   CORONARY/GRAFT ACUTE MI REVASCULARIZATION N/A 01/17/2021   Procedure: Coronary/Graft Acute MI Revascularization;  Surgeon: Troy Sine, MD;  Location: [place] [facility];  Service: Cardiovascular;  Laterality: N/A;   CYSTOSCOPY N/A 05/21/2018   Procedure: CYSTOSCOPY FLEXIBLE;  Surgeon: Festus Aloe, MD;  Location: [facility];  Service: Urology;  Laterality: N/A;   KNEE SURGERY Left 1991 and 2001   X2 arthroscopy   LEFT HEART CATH AND CORONARY ANGIOGRAPHY N/A 01/17/2021   Procedure: LEFT HEART CATH AND CORONARY ANGIOGRAPHY;  Surgeon: Troy Sine, MD;  Location: [place] [facility];  Service: Cardiovascular;  Laterality: N/A;   LUMBAR PERCUTANEOUS PEDICLE SCREW 3 LEVEL N/A 11/11/2017   Procedure: LUMBAR PERCUTANEOUS PEDICLE SCREW PLACEMENT LUMBAR TWO-THREE, LUMBER THREE-FOUR, LUMBAR FOUR-FIVE;  Surgeon: Jovita Gamma, MD;  Location: [place];  Service: Neurosurgery;  Laterality: N/A;   SPINE SURGERY  12-08, 5-10   TOTAL HIP ARTHROPLASTY     oct 2006,   TOTAL HIP ARTHROPLASTY Right 02/09/2018   Procedure: RIGHT TOTAL HIP ARTHROPLASTY ANTERIOR APPROACH;  Surgeon: Paralee Cancel, MD;  Location: [facility];  Service: Orthopedics;  Laterality: Right;    Current Medications: Outpatient Medications Prior to Visit  Medication Sig Dispense Refill   acetaminophen (TYLENOL) 500 MG tablet Take 500 mg by mouth daily as needed for moderate pain.     aspirin 81 MG chewable  tablet Chew 1 tablet (81 mg total) by mouth daily.     calcium carbonate (TUMS - DOSED IN MG ELEMENTAL CALCIUM) 500 MG chewable tablet Chew 2 tablets by mouth daily as needed for indigestion or heartburn.     Cholecalciferol (VITAMIN D3) 2000 units TABS Take 2,000 Units by mouth daily.     gabapentin (NEURONTIN) 100 MG capsule TAKE TWO CAPSULES AT BEDTIME. (Patient taking differently: Take 200 mg by mouth at bedtime.) 180 capsule 1   losartan (COZAAR) 25 MG tablet Take 9m in the morning and 25min the evening. 270 tablet 3   Menthol, Topical Analgesic, (BIOFREEZE) 4 % GEL Apply 1 application topically daily as needed (pain).     metoprolol succinate (TOPROL-XL) 25 MG 24 hr tablet Take 1 tablet (25 mg total) by mouth daily. 30 tablet 11   Multiple Vitamins-Minerals (HAIR SKIN AND NAILS FORMULA PO) Take 1 tablet by mouth daily.     nitroGLYCERIN (NITROSTAT) 0.4 MG SL tablet Place 1 tablet (0.4 mg total) under the tongue every 5 (five) minutes as needed for chest pain. 25 tablet 3   Omega-3 Fatty Acids (FISH OIL) 1200 MG CAPS Take 1 capsule (1,200 mg total) by mouth daily. 30 capsule 1   rosuvastatin (CRESTOR) 40 MG tablet Take 1 tablet (40 mg total) by mouth daily. 30 tablet 11   ticagrelor (BRILINTA) 90 MG TABS tablet Take 1 tablet (90 mg total) by mouth 2 (two) times daily. 60 tablet 11   tiZANidine (ZANAFLEX) 4 MG tablet Take 1 tablet (4 mg total) by mouth every 8 (eight) hours as needed for muscle spasms. 90 tablet 1   traMADol (ULTRAM) 50 MG tablet Take 1 tablet (50 mg total) by mouth every 6 (six) hours as needed for moderate pain. 120 tablet 2   vitamin B-12 (CYANOCOBALAMIN) 1000 MCG tablet Take 1,000 mcg by mouth daily.     vitamin C (ASCORBIC ACID) 250 MG tablet Take 250 mg by mouth daily.     No facility-administered medications prior to visit.     Allergies:   Codeine   Social History   Socioeconomic History   Marital status: Married    Spouse name: Not on file   Number of  children: Not on file   Years of education: Not on file   Highest education level: Not on file  Occupational History   Not on file  Tobacco Use  Smoking status: Never   Smokeless tobacco: Never  Vaping Use   Vaping Use: Never used  Substance and Sexual Activity   Alcohol use: No   Drug use: No   Sexual activity: Not on file  Other Topics Concern   Not on file  Social History Narrative   Divorced, Exercises regularly   Social Determinants of Health   Financial Resource Strain: Low Risk    Difficulty of Paying Living Expenses: Not hard at all  Food Insecurity: No Food Insecurity   Worried About Charity fundraiser in the Last Year: Never true   Arboriculturist in the Last Year: Never true  Transportation Needs: No Transportation Needs   Lack of Transportation (Medical): No   Lack of Transportation (Non-Medical): No  Physical Activity: Sufficiently Active   Days of Exercise per Week: 5 days   Minutes of Exercise per Session: 30 min  Stress: No Stress Concern Present   Feeling of Stress : Not at all  Social Connections: Moderately Isolated   Frequency of Communication with Friends and Family: More than three times a week   Frequency of Social Gatherings with Friends and Family: More than three times a week   Attends Religious Services: Never   Marine scientist or Organizations: No   Attends Archivist Meetings: Never   Marital Status: Married    Socially he is married.  There is no tobacco use or alcohol use.  He exercises regularly.  Family History:  The patient's family history includes Coronary artery disease (age of onset: 2) in his mother; Diabetes in his mother; Hypertension in his mother; Other (age of onset: 3) in his father.   ROS General: Negative; No fevers, chills, or night sweats;  HEENT: Negative; No changes in vision or hearing, sinus congestion, difficulty swallowing Pulmonary: Negative; No cough, wheezing, shortness of breath,  hemoptysis Cardiovascular: Negative; No chest pain, presyncope, syncope, palpitations GI: Negative; No nausea, vomiting, diarrhea, or abdominal pain GU: Negative; No dysuria, hematuria, or difficulty voiding Musculoskeletal: Arthritic symptoms, bilateral hip replacement Hematologic/Oncology: Negative; no easy bruising, bleeding Endocrine: Negative; no heat/cold intolerance; no diabetes Neuro: Negative; no changes in balance, headaches Skin: Negative; No rashes or skin lesions Psychiatric: He admits to some anxiety Sleep: Negative; No snoring, daytime sleepiness, hypersomnolence, bruxism, restless legs, hypnogognic hallucinations, no cataplexy Other comprehensive 14 point system review is negative.   PHYSICAL EXAM:   VS:  BP 122/80    Pulse 66    Ht _0  (1.93 m)    Wt 206 lb 6.4 oz (93.6 kg)    SpO2 98%    BMI 25.12 kg/m     Repeat blood pressure by me was 114/68  Wt Readings from Last 3 Encounters:  07/08/21 206 lb 6.4 oz (93.6 kg)  06/21/21 208 lb 14.4 oz (94.8 kg)  05/02/21 204 lb (92.5 kg)    General: Alert, oriented, no distress.  Skin: normal turgor, no rashes, warm and dry HEENT: Normocephalic, atraumatic. Pupils equal round and reactive to light; sclera anicteric; extraocular muscles intact; Nose without nasal septal hypertrophy Mouth/Parynx benign; Mallinpatti scale 3 Neck: No JVD, no carotid bruits; normal carotid upstroke Lungs: clear to ausculatation and percussion; no wheezing or rales Chest wall: without tenderness to palpitation Heart: PMI not displaced, RRR, s1 s2 normal, 1/6 systolic murmur, no diastolic murmur, no rubs, gallops, thrills, or heaves Abdomen: soft, nontender; no hepatosplenomehaly, BS+; abdominal aorta nontender and not dilated by palpation. Back: no CVA tenderness  Pulses 2+ Musculoskeletal: full range of motion, normal strength, no joint deformities Extremities: no clubbing cyanosis or edema, Homan's sign negative  Neurologic: grossly nonfocal;  Cranial nerves grossly wnl Psychologic: Normal mood and affect   Studies/Labs Reviewed:   July 13, 2020 ECG (independently read by me): Normal sinus rhythm at 63 bpm, first-degree block with a PR 220 msec; Nonspecific T changes in lead III.  February 05, 2021 ECG (independently read by me):  Sinus rhythm, 1st degree AV block; PVCs, inferior Q wave and T wave inversion III, aVF, QTc 421 msec  Recent Labs: BMP Latest Ref Rng & Units 06/21/2021 06/21/2021 04/12/2021  Glucose 70 - 99 mg/dL - 73 76  BUN 6 - 23 mg/dL - 22 16  Creatinine 0.40 - 1.50 mg/dL - 1.09 1.07  BUN/Creat Ratio 10 - 24 - - 15  Sodium 135 - 145 mEq/L - 140 147(H)  Potassium 3.5 - 5.1 mEq/L - 4.2 4.7  Chloride 96 - 112 mEq/L - 104 106  CO2 19 - 32 mEq/L - 27 27  Calcium 8.6 - 10.3 mg/dL 9.8 10.0 10.5(H)     Hepatic Function Latest Ref Rng & Units 06/21/2021 01/17/2021 12/19/2020  Total Protein 6.0 - 8.3 g/dL 7.0 6.3(L) 6.9  Albumin 3.5 - 5.2 g/dL 4.4 4.0 4.5  AST 0 - 37 U/L _0 ALT 0 - 53 U/L _1 Alk Phosphatase 39 - 117 U/L 49 38 47  Total Bilirubin 0.2 - 1.2 mg/dL 1.5(H) 1.6(H) 1.0  Bilirubin, Direct 0.0 - 0.3 mg/dL - - -    CBC Latest Ref Rng & Units 06/21/2021 01/18/2021 01/17/2021  WBC 4.0 - 10.5 K/uL 6.5 9.5 -  Hemoglobin 13.0 - 17.0 g/dL 16.5 16.6 15.0  Hematocrit 39.0 - 52.0 % 50.6 50.3 44.0  Platelets 150.0 - 400.0 K/uL 221.0 187 -   Lab Results  Component Value Date   MCV 90.7 06/21/2021   MCV 94.7 01/18/2021   MCV 90.0 01/17/2021   Lab Results  Component Value Date   TSH 2.17 03/02/2019   Lab Results  Component Value Date   HGBA1C 5.5 01/17/2021     BNP No results found for: BNP  ProBNP No results found for: PROBNP   Lipid Panel     Component Value Date/Time   CHOL 125 01/17/2021 1208   TRIG 109 01/17/2021 1208   TRIG 95 05/26/2006 0848   HDL 37 (L) 01/17/2021 1208   CHOLHDL 3.4 01/17/2021 1208   VLDL 22 01/17/2021 1208   LDLCALC 66 01/17/2021 1208   LDLCALC 107 (H)  03/05/2020 0839   LDLDIRECT 128.0 03/02/2019 0852     RADIOLOGY: No results found.   Additional studies/ records that were reviewed today include:   EMERGENT CATH/PCI: 01/17/2021   Mid RCA to Dist RCA lesion is 100% stenosed.   Dist RCA lesion is 30% stenosed.   RPAV lesion is 60% stenosed.   RPDA lesion is 100% stenosed.   Ost RCA to Prox RCA lesion is 80% stenosed.   Prox RCA lesion is 40% stenosed.   A stent was successfully placed.   A stent was successfully placed.   Post intervention, there is a 0% residual stenosis.   Post intervention, there is a 0% residual stenosis.   LV end diastolic pressure is moderately elevated.   Acute ST segment elevation inferior wall myocardial infarction secondary to mid-distal thrombotic occlusion of the large dominant RCA.   No significant obstructive disease in the left  coronary circulation.   Very difficult but successful coronary intervention to the RCA with significant catheter backup difficulties, calcified vessel with tortuosity and needing GuideLiner support for ultimate stenting near the acute margin with a 3.5 x 15 mm Resolute Onyx Frontier stent postdilated to 3.8 mm with the 100% occlusion being reduced to 0% and successful stenting of the very proximal RCA with ultimate insertion of a 3.5 x 18 mm Resolute Onyx Frontier stent postdilated to 3.85 mm with both stenosis being reduced to 0%.  There is diffuse irregularity in the RCA with 40% proximal to mid calcified stenosis, 30% distal stenosis with 60% stenosis in the PLA vessel and thrombotic occlusion of the distal portion of the PDA vessel for which Aggrenox was initiated.   RECOMMENDATION: DAPT for minimum of 12 months but probably longer.  Aggressive lipid-lowering therapy with target LDL less than 70 and preferably in the 50s or below.  Medical therapy for concomitant CAD.       Intervention     ECHO 01/18/2021 IMPRESSIONS   1. Left ventricular ejection fraction, by  estimation, is 35 to 40%. The  left ventricle has moderately decreased function. The left ventricle  demonstrates regional wall motion abnormalities (see scoring  diagram/findings for description). The left  ventricular internal cavity size was mildly dilated. Left ventricular  diastolic parameters are consistent with Grade I diastolic dysfunction  (impaired relaxation). There is severe hypokinesis of the left  ventricular, mid inferoseptal wall.   2. Right ventricular systolic function is normal. The right ventricular  size is normal.   3. Right atrial size was mildly dilated.   4. The mitral valve is normal in structure. No evidence of mitral valve  regurgitation.   5. The aortic valve is grossly normal. Aortic valve regurgitation is  mild.   6. Aortic dilatation noted. There is moderate dilatation of the ascending  aorta, measuring 45 mm.    ECHO: 03/26/2021 IMPRESSIONS   1. Left ventricular ejection fraction, by estimation, is 45 to 50%. The  left ventricle has mildly decreased function. The left ventricle  demonstrates regional wall motion abnormalities (see scoring  diagram/findings for description). Left ventricular  diastolic parameters are consistent with Grade I diastolic dysfunction  (impaired relaxation).   2. Right ventricular systolic function is normal. The right ventricular  size is normal. Tricuspid regurgitation signal is inadequate for assessing  PA pressure.   3. The mitral valve is grossly normal. Trivial mitral valve  regurgitation. No evidence of mitral stenosis.   4. The aortic valve is tricuspid. Aortic valve regurgitation is mild. No  aortic stenosis is present.   5. Ascending aorta is aneurysmal up to 49 mm. Would recommend cross  sectional imaging for better characterization. Aneurysm of the ascending  aorta, measuring 49 mm.   6. The inferior vena cava is normal in size with greater than 50%  respiratory variability, suggesting right atrial pressure  of 3 mmHg.   Comparison(s): Changes from prior study are noted. EF has improved.  Inferior/posterior walls remain hypokinetic.   CT ANGIO CHEST: 05/16/2021 FINDINGS: Cardiovascular: Intact thoracic aorta. Negative for acute aortic process or dissection. Patent 3 vessel arch anatomy with minor atherosclerotic change.   Mild dilatation of the sinus of Valsalva measuring 45 mm.   Sinotubular junction 33 mm   Ascending thoracic aorta 36 mm   Aortic arch 30 mm   Descending thoracic aorta 30 mm.   No mediastinal hemorrhage or hematoma. Limited assessment of the pulmonary arteries. Normal heart size.  No pericardial effusion. Native coronary atherosclerosis.   Central venous structures are patent. No veno-occlusive process or anomalous pulmonary venous return.   Mediastinum/Nodes: No enlarged mediastinal, hilar, or axillary lymph nodes. Thyroid gland, trachea, and esophagus demonstrate no significant findings.   Lungs/Pleura: Right lower lobe peripheral 8 mm nodule, image 101/11.   No acute airspace process, collapse or consolidation. No interstitial disease or edema. Trachea and central airways are patent. No pleural abnormality, effusion, or pneumothorax.   Upper Abdomen: No acute upper abdominal finding. Right upper pole renal cyst measures 5.5 cm.   Musculoskeletal: Fusion hardware noted of the upper lumbar spine. Degenerative changes present. No acute osseous finding.   Review of the MIP images confirms the above findings.   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 Remainder of the thoracic aorta is normal in caliber.   No other acute intrathoracic process.   8 mm right lower lobe pulmonary nodule. Non-contrast chest CT at 6-12 months is recommended. If the nodule is stable at time of repeat CT, then future CT at 18-24 months (from today's scan) is considered optional for low-risk patients, but is recommended  for high-risk patients. This recommendation follows the consensus statement: Guidelines for Management of Incidental Pulmonary Nodules Detected on CT Images: From the Fleischner Society 2017; Radiology 2017; 284:228-243.   Native coronary atherosclerosis   Aortic Atherosclerosis (ICD10-I70.0).  ASSESSMENT:    1. STEMI involving right coronary artery ([place]): 01/17/2021 with the mid and proximal RCA   2. Hyperlipidemia with target LDL less than 70   3. Ischemic cardiomyopathy: Improved, EF now 45 to 50% on March 26, 2021 echo   4. Dilatation of aortic sinus of Valsalva   5. History of gastroesophageal reflux (GERD)     PLAN:  Mr. Jibri "Lanny Hurst "[name] is a 76 year old gentleman who has a history of hypertension, hyperlipidemia, spinal stenosis, and GERD.  He has been active for most of his life and has been swimming daily and was playing golf several days per week.  There is no tobacco history or alcohol use.  Recently he had been more aggressive with dietary change and had not been eating any meat since early July 2022.  He presented with an acute inferior wall STEMI on January 17, 2021 and was found to have mid-distal thrombotic occlusion of a large dominant RCA.  He underwent difficult but successful coronary intervention to his calcified tortuous vessel with ultimate stenting with a 3.5 x 15 mm Medtronic Onyx frontiers stent postdilated to 3.8 mm at the occlusion site with resumption of brisk TIMI-3 flow and due to proximal 80% stenosis underwent stenting with a 3.5 x 18 mm stent postdilated to 3.85 mm.  His initial EF the following day did show reduced LV function with an EF at approximately 35 to 40%.  Of note, he also had moderate dilatation of his ascending aorta at 45 mm.  He had an uneventful postoperative course and was discharged 2 days following his MI.  When I saw him for his initial post hospital office evaluation in August 2022 with his reduced LV function, I recommended titration of  losartan to 25 mg twice a day from its present dose of 25 mg daily.  He was on metoprolol succinate 25 mg daily.  He has continued to feel well.  His subsequent echo Doppler study from March 26, 2021 demonstrated improved LV function with EF now at 45 to 50%.  At that time with class  I symptoms rather than initiate Entresto I further titrated losartan to 50 mg in the morning and 25 mg in the evening for more optimal blood pressure control.  He has continued to be on metoprolol succinate.  Due to concerns of possible progressive a sending aortic dilatation at 49 mm on the October 2022 echo, he was referred for CT angio of his chest and aorta which was done on May 16, 2021.  I thoroughly reviewed this study with him in detail.  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  Incidentally he was found to have an 8 mm right lower lobe pulmonary nodule and noncontrast chest CT has been recommended in 6 to 12 months.  Presently, Bryan [name] blood pressure is well controlled on his current regimen now consisting of losartan 50 mg in the morning and 25 mg in the evening, and metoprolol succinate 25 mg daily.  He continues to be on DAPT with aspirin/Brilinta 90 mg twice daily.  He is on rosuvastatin 40 mg in addition to omega-3 fatty acid.  LDL cholesterol on June 21, 2021 was excellent at 34 with total cholesterol 106 and triglycerides 96.  Hemoglobin A1c was excellent at 5.5.  LFTs were normal and he had normal renal function with creatinine of 1.09.  He does have some arthritic issues and is status post bilateral hip replacement.  Clinically he is doing well and remains active swimming as well as playing golf.  I will see him in 6 months for reevaluation or sooner as needed.   Medication Adjustments/Labs and Tests Ordered: Current medicines are reviewed at length with the patient today.   Concerns regarding medicines are outlined above.  Medication changes, Labs and Tests ordered today are listed in the Patient Instructions below. Patient Instructions  Medication Instructions:  NOT CHANGES  *If you need a refill on your cardiac medications before your next appointment, please call your pharmacy*   Lab Work: NOT NEEDED If you have labs (blood work) drawn today and your tests are completely normal, you will receive your results only by: [place] (if you have MyChart) OR A paper copy in the mail If you have any lab test that is abnormal or we need to change your treatment, we will call you to review the results.   Testing/Procedures:  NOT NEEDED  Follow-Up: At [facility], you and your health needs are our priority.  As part of our continuing mission to provide you with exceptional heart care, we have created designated Provider Care Teams.  These Care Teams include your primary Cardiologist (physician) and Advanced Practice Providers (APPs -  Physician Assistants and Nurse Practitioners) who all work together to provide you with the care you need, when you need it.     Your next appointment:   6 month(s)  The format for your next appointment:   In Person  Provider:   Shelva Majestic, MD     Signed, Shelva Majestic, MD  07/15/2021 5:54 PM    [facility] [address] [place], [place], [place]  [address] Phone: [phone]

## 2021-07-17 ENCOUNTER — Ambulatory Visit (HOSPITAL_COMMUNITY): Payer: Medicare Other

## 2021-07-19 ENCOUNTER — Ambulatory Visit (HOSPITAL_COMMUNITY): Payer: Medicare Other

## 2021-07-22 ENCOUNTER — Ambulatory Visit (HOSPITAL_COMMUNITY): Payer: Medicare Other

## 2021-07-24 ENCOUNTER — Ambulatory Visit (HOSPITAL_COMMUNITY): Payer: Medicare Other

## 2021-07-26 ENCOUNTER — Ambulatory Visit (HOSPITAL_COMMUNITY): Payer: Medicare Other

## 2021-08-02 DIAGNOSIS — H2513 Age-related nuclear cataract, bilateral: Secondary | ICD-10-CM | POA: Diagnosis not present

## 2021-08-02 DIAGNOSIS — D3132 Benign neoplasm of left choroid: Secondary | ICD-10-CM | POA: Diagnosis not present

## 2021-08-02 DIAGNOSIS — H43813 Vitreous degeneration, bilateral: Secondary | ICD-10-CM | POA: Diagnosis not present

## 2021-08-02 DIAGNOSIS — H40013 Open angle with borderline findings, low risk, bilateral: Secondary | ICD-10-CM | POA: Diagnosis not present

## 2021-08-02 DIAGNOSIS — H02834 Dermatochalasis of left upper eyelid: Secondary | ICD-10-CM | POA: Diagnosis not present

## 2021-08-02 DIAGNOSIS — H02831 Dermatochalasis of right upper eyelid: Secondary | ICD-10-CM | POA: Diagnosis not present

## 2021-08-05 DIAGNOSIS — L570 Actinic keratosis: Secondary | ICD-10-CM | POA: Diagnosis not present

## 2021-08-28 ENCOUNTER — Other Ambulatory Visit: Payer: Self-pay | Admitting: Family Medicine

## 2021-09-06 ENCOUNTER — Telehealth: Payer: Self-pay | Admitting: Pharmacist

## 2021-09-06 NOTE — Chronic Care Management (AMB) (Signed)
? ? ?  Chronic Care Management ?Pharmacy Assistant  ? ?Name: Bryan [name]  MRN: [id number] DOB: 02/27/1946 ? ?09/06/21 APPOINTMENT REMINDER ? ? ?Called Patient No answer, left message of appointment on 09/09/21 at 4 via telephone visit with Jeni Salles, Pharm D.  ? ?Notified to have all medications, supplements, blood pressure and/or blood sugar logs available during appointment and to return call if need to reschedule. ? ?  ? ? ? ?Care Gaps: ?COVID Booster - Overdue ?BP- 122/80 ( 07/08/21) ?AWV- 9/22 ? ?Star Rating Drug: ?Losartan 50 mg - last filled 08/09/21 30 DS at [facility]  ?Rosuvastatin 40 mg - last filled 07/19/21 90 DS at [facility] ? ?Any gaps in medications fill history? ? ?None ? ?  ? ? ?Medications: ?Outpatient Encounter Medications as of 09/06/2021  ?Medication Sig  ? acetaminophen (TYLENOL) 500 MG tablet Take 500 mg by mouth daily as needed for moderate pain.  ? aspirin 81 MG chewable tablet Chew 1 tablet (81 mg total) by mouth daily.  ? calcium carbonate (TUMS - DOSED IN MG ELEMENTAL CALCIUM) 500 MG chewable tablet Chew 2 tablets by mouth daily as needed for indigestion or heartburn.  ? Cholecalciferol (VITAMIN D3) 2000 units TABS Take 2,000 Units by mouth daily.  ? gabapentin (NEURONTIN) 100 MG capsule TAKE TWO CAPSULES AT BEDTIME.  ? losartan (COZAAR) 25 MG tablet Take '50mg'$  in the morning and '25mg'$  in the evening.  ? Menthol, Topical Analgesic, (BIOFREEZE) 4 % GEL Apply 1 application topically daily as needed (pain).  ? metoprolol succinate (TOPROL-XL) 25 MG 24 hr tablet Take 1 tablet (25 mg total) by mouth daily.  ? Multiple Vitamins-Minerals (HAIR SKIN AND NAILS FORMULA PO) Take 1 tablet by mouth daily.  ? nitroGLYCERIN (NITROSTAT) 0.4 MG SL tablet Place 1 tablet (0.4 mg total) under the tongue every 5 (five) minutes as needed for chest pain.  ? Omega-3 Fatty Acids (FISH OIL) 1200 MG CAPS Take 1 capsule (1,200 mg total) by mouth daily.  ? rosuvastatin (CRESTOR) 40 MG tablet Take 1 tablet (40 mg total)  by mouth daily.  ? ticagrelor (BRILINTA) 90 MG TABS tablet Take 1 tablet (90 mg total) by mouth 2 (two) times daily.  ? tiZANidine (ZANAFLEX) 4 MG tablet Take 1 tablet (4 mg total) by mouth every 8 (eight) hours as needed for muscle spasms.  ? traMADol (ULTRAM) 50 MG tablet Take 1 tablet (50 mg total) by mouth every 6 (six) hours as needed for moderate pain.  ? vitamin B-12 (CYANOCOBALAMIN) 1000 MCG tablet Take 1,000 mcg by mouth daily.  ? vitamin C (ASCORBIC ACID) 250 MG tablet Take 250 mg by mouth daily.  ? ?No facility-administered encounter medications on file as of 09/06/2021.  ? ? ? ? ?Ned Clines CMA ?Clinical Pharmacist Assistant ?[phone] ? ?

## 2021-09-08 ENCOUNTER — Other Ambulatory Visit: Payer: Self-pay | Admitting: Family Medicine

## 2021-09-09 ENCOUNTER — Ambulatory Visit (INDEPENDENT_AMBULATORY_CARE_PROVIDER_SITE_OTHER): Payer: Medicare Other | Admitting: Pharmacist

## 2021-09-09 DIAGNOSIS — I10 Essential (primary) hypertension: Secondary | ICD-10-CM

## 2021-09-09 DIAGNOSIS — E785 Hyperlipidemia, unspecified: Secondary | ICD-10-CM

## 2021-09-09 NOTE — Patient Instructions (Signed)
Hi Bryan [name], ? ?It was great to catch up with you again! ? ?We talked about capsiacin cream and you may want to try this.  ? ?Please reach out to me if you have any questions or need anything before our follow up! ? ?Best, ?Maddie ? ?Jeni Salles, PharmD, BCACP ?Clinical Pharmacist ?Therapist, music at [facility] ?[phone] ? ? Visit Information ? ? Goals Addressed   ?None ?  ? ?Patient Care Plan: [place]  ?  ? ?Problem Identified: Problem: hypertension, hyperlipidemia, history of STEMI, osteoarthritis, pain   ?  ? ?Long-Range Goal: Patient-Specific Goal   ?Start Date: 09/11/2020  ?Expected End Date: 09/11/2021  ?Recent Progress: On track  ?Priority: High  ?Note:   ?Current Barriers:  ?Unable to independently monitor therapeutic efficacy ? ?Pharmacist Clinical Goal(s):  ?Patient will achieve adherence to monitoring guidelines and medication adherence to achieve therapeutic efficacy through collaboration with PharmD and provider.  ? ? ?Interventions: ?1:1 collaboration with Caren Macadam, MD regarding development and update of comprehensive plan of care as evidenced by provider attestation and co-signature ?Inter-disciplinary care team collaboration (see longitudinal plan of care) ?Comprehensive medication review performed; medication list updated in electronic medical record ? ?Hypertension (BP goal <130/80) ?-Controlled ?-Current treatment: ?Losartan 25 mg 1 tablet twice daily - Appropriate, Effective, Safe, Accessible ?Metoprolol succinate 25 mg 1 tablet daily - Appropriate, Effective, Safe, Accessible ?-Medications previously tried: lisinopril-HCTZ (switched to losartan) ?-Current home readings: 120/76, 122/53, 102/60 (office), 122/80 (office), 105/60 (dentist office)  ?-Current dietary habits: does not use salt at all except for watermelon and cantaloupe  ?-Current exercise habits: swimming and at the gym daily ?-Denies hypotensive/hypertensive symptoms ?-Educated on Exercise goal of 150  minutes per week; ?Importance of home blood pressure monitoring; ?Proper BP monitoring technique; ?-Counseled to monitor BP at home weekly, document, and provide log at future appointments ?-Counseled on diet and exercise extensively ?Recommended to continue current medication ?Recommended purchasing an [employer] BP arm cuff to monitor BP at home. ? ?Osteoarthritis/pain (Goal: minimize pain) ?-Not ideally controlled ?-Current treatment  ?Tramadol 50 mg 1 tablet every 6 hours as needed for pain (2 per day) - Appropriate, Effective, Safe, Accessible ?Tizanidine 4 mg 1 tablet three times daily as needed (sometimes doesn't take it every day - seldom using) - Appropriate, Effective, Safe, Accessible ?Gabapentin 100 mg 2 capsules at bedtime - Appropriate, Effective, Safe, Accessible ?Acetaminophen 650 mg 1 tablet as needed for pain (2 times a day) - Appropriate, Effective, Safe, Accessible ?-Medications previously tried: meloxicam (stopped due to risk for bleeding) ?-Counseled on risk for bleeding with meloxicam. ? ?Hyperlipidemia: (LDL goal < 70) ?-Controlled ?-Current treatment: ?Rosuvastatin 40 mg 1 tablet daily - Appropriate, Effective, Safe, Accessible ?Fish oil 1200 mg 1 capsule daily - Appropriate, Effective, Safe, Accessible ?-Medications previously tried: none  ?-Current dietary patterns: limiting fried foods and eating lots of fiber in diet ?-Current exercise habits: swimming and walking in the pool ?-Educated on Cholesterol goals;  ?Benefits of statin for ASCVD risk reduction; ?Importance of limiting foods high in cholesterol; ?-Counseled on diet and exercise extensively ?Recommended to continue current medication ? ?History of NSTEMI (Goal: prevention of heart events) ?-Controlled ?-Current treatment  ?Brilinta 90 mg 1 tablet twice daily - Appropriate, Effective, Safe, Accessible ?Aspirin 81 mg 1 tablet daily - Appropriate, Effective, Safe, Accessible ?Rosuvastatin 40 mg 1 tablet daily - Appropriate, Effective,  Safe, Accessible ?-Medications previously tried: none  ?-Recommended to continue current medication ?Assessed patient finances. Patient would not qualify for  Brilinta patient assistance. ? ?Health Maintenance ?-Vaccine gaps: shingles ?-Current therapy:  ?Multivitamin 1 capsule daily ?Hair skin and nails vitamin 1 tablet daily ?Vitamin C 250 mg 1 tablet daily ?Vitamin B complex 1 tablet daily ?Vitamin D 2000 units 1 tablet daily ?Ginger 1 tablet daily ?Alfalfa 1200 mg daily ?Magnesium 400 mg daily ?Black elderberry daily ?-Educated on Herbal supplement research is limited and benefits usually cannot be proven ?Cost vs benefit of each product must be carefully weighed by individual consumer ?-Patient is satisfied with current therapy and denies issues ?-Recommended to continue current medication ? ?Patient Goals/Self-Care Activities ?Patient will:  ?- take medications as prescribed ?check blood pressure weekly, document, and provide at future appointments ?target a minimum of 150 minutes of moderate intensity exercise weekly ? ?Follow Up Plan: Telephone follow up appointment with care management team member scheduled for: 1 year ? ?  ?  ? ?Patient verbalizes understanding of instructions and care plan provided today and agrees to view in [place]. Active MyChart status confirmed with patient.   ?Telephone follow up appointment with pharmacy team member scheduled for: ? ?Viona Gilmore, RPH  ?

## 2021-09-09 NOTE — Progress Notes (Signed)
? ?Chronic Care Management ?Pharmacy Note ? ?09/09/2021 ?Name:  Bryan [name] MRN:  [id number] DOB:  [birth year] ? ?Summary: ?BP is at goal < 130/80 per office readings ?Pt BP machine stopped working ?  ?Recommendations/Changes made from today's visit: ?-Recommended returning BP cuff and purchasing new one as previous only worked for 2 months ?-Consider trial of capsaicin for pain ? ?Plan: ?Follow up BP assessment in 3-4 months ? ?Subjective: ?Bryan [name] is an 76 y.o. year old male who is a primary patient of Koberlein, Steele Berg, MD.  The CCM team was consulted for assistance with disease management and care coordination needs.   ? ?Engaged with patient by telephone for follow up visit in response to provider referral for pharmacy case management and/or care coordination services.  ? ?Consent to Services:  ?The patient was given information about Chronic Care Management services, agreed to services, and gave verbal consent prior to initiation of services.  Please see initial visit note for detailed documentation.  ? ?Patient Care Team: ?Caren Macadam, MD as PCP - General (Family Medicine) ?Troy Sine, MD as PCP - Cardiology (Cardiology) ?Festus Aloe, MD as Consulting Physician (Urology) ?Danella Sensing, MD as Consulting Physician (Dermatology) ?Michael Boston, MD as Consulting Physician (General Surgery) ?Paralee Cancel, MD as Consulting Physician (Orthopedic Surgery) ?Jovita Gamma, MD as Consulting Physician (Neurosurgery) ?Viona Gilmore, [facility] as Pharmacist (Pharmacist) ? ?Recent office visits: ?06/21/21  Micheline Rough, MD: Patient presented for chronic conditions follow up. Follow up in 6 months for physical. ? ?03/13/21 Charlott Rakes, LPN: Patient presented for AWV. ? ?Recent consult visits: ?07/08/21 Shelva Majestic, MD (cardiology): Patient presented for for STEMI follow up. Plan for DAPT for 12 months or longer. Follow up in 6 months. ? ?05/02/21 Hulan Saas, DO (sports medicine):  Patient presented for lumbar stenosis follow up. No medication changes. ? ?04/24/21 Danella Sensing, MD (dermatology): Patient presented for squamous cell carcinoma follow up. Unable to access notes. ? ?04/07/22 Shelva Majestic, MD (cardiology): Patient presented for for STEMI follow up. Increased Losartan from 27m BID to 528mAM and 2510mM. Follow up in 3 months. ? ?03/29/21 MatFestus AloeD (urology): Patient presented for frequency of micturition follow up. Unable to access notes. ? ?Hospital visits: ?None in previous 6 months. ? ?Objective: ? ?Lab Results  ?Component Value Date  ? CREATININE 1.09 06/21/2021  ? BUN 22 06/21/2021  ? GFR 67.55 06/21/2021  ? GFRNONAA >60 01/18/2021  ? GFRAA >60 02/10/2018  ? NA 140 06/21/2021  ? K 4.2 06/21/2021  ? CALCIUM 9.8 06/21/2021  ? CALCIUM 10.0 06/21/2021  ? CO2 27 06/21/2021  ? GLUCOSE 73 06/21/2021  ? ? ?Lab Results  ?Component Value Date/Time  ? HGBA1C 5.5 01/17/2021 12:08 PM  ? HGBA1C 5.4 03/05/2020 08:39 AM  ? GFR 67.55 06/21/2021 09:26 AM  ? GFR 52.44 (L) 12/19/2020 09:50 AM  ?  ?Last diabetic Eye exam: No results found for: HMDIABEYEEXA  ?Last diabetic Foot exam: No results found for: HMDIABFOOTEX  ? ?Lab Results  ?Component Value Date  ? CHOL 125 01/17/2021  ? HDL 37 (L) 01/17/2021  ? LDLJim Thorpe 01/17/2021  ? LDLDIRECT 128.0 03/02/2019  ? TRIG 109 01/17/2021  ? CHOLHDL 3.4 01/17/2021  ? ? ? ?  Latest Ref Rng & Units 06/21/2021  ?  9:26 AM 01/17/2021  ? 12:08 PM 12/19/2020  ?  9:50 AM  ?Hepatic Function  ?Total Protein 6.0 - 8.3 g/dL 7.0   6.3   6.9    ?  Albumin 3.5 - 5.2 g/dL 4.4   4.0   4.5    ?AST 0 - 37 U/L 26   28   23     ?ALT 0 - 53 U/L 21   24   20     ?Alk Phosphatase 39 - 117 U/L 49   38   47    ?Total Bilirubin 0.2 - 1.2 mg/dL 1.5   1.6   1.0    ? ? ?Lab Results  ?Component Value Date/Time  ? TSH 2.17 03/02/2019 08:52 AM  ? TSH 1.69 01/25/2018 10:04 AM  ? ? ? ?  Latest Ref Rng & Units 06/21/2021  ?  9:26 AM 01/18/2021  ?  1:25 AM 01/17/2021  ? 12:27 PM  ?CBC  ?WBC 4.0 -  10.5 K/uL 6.5   9.5     ?Hemoglobin 13.0 - 17.0 g/dL 16.5   16.6   15.0    ?Hematocrit 39.0 - 52.0 % 50.6   50.3   44.0    ?Platelets 150.0 - 400.0 K/uL 221.0   187     ? ? ?Lab Results  ?Component Value Date/Time  ? VD25OH 39.93 06/21/2021 09:26 AM  ? VD25OH 44.89 10/12/2019 09:11 AM  ? ? ?Clinical ASCVD: Yes  ?The ASCVD Risk score (Arnett DK, et al., 2019) failed to calculate for the following reasons: ?  The patient has a prior MI or stroke diagnosis   ? ? ?  06/21/2021  ?  8:16 AM 03/13/2021  ?  3:46 PM 12/19/2020  ? 10:00 AM  ?Depression screen PHQ 2/9  ?Decreased Interest 0 0 0  ?Down, Depressed, Hopeless 0 0 0  ?PHQ - 2 Score 0 0 0  ?Altered sleeping 0  0  ?Tired, decreased energy 0  0  ?Change in appetite 0  0  ?Feeling bad or failure about yourself  0  1  ?Trouble concentrating 0  0  ?Moving slowly or fidgety/restless 0  0  ?Suicidal thoughts 0  0  ?PHQ-9 Score 0  1  ?Difficult doing work/chores   Not difficult at all  ?  ? ? ?Social History  ? ?Tobacco Use  ?Smoking Status Never  ?Smokeless Tobacco Never  ? ?BP Readings from Last 3 Encounters:  ?07/08/21 122/80  ?06/21/21 102/60  ?05/02/21 124/82  ? ?Pulse Readings from Last 3 Encounters:  ?07/08/21 66  ?06/21/21 64  ?05/02/21 62  ? ?Wt Readings from Last 3 Encounters:  ?07/08/21 206 lb 6.4 oz (93.6 kg)  ?06/21/21 208 lb 14.4 oz (94.8 kg)  ?05/02/21 204 lb (92.5 kg)  ? ?BMI Readings from Last 3 Encounters:  ?07/08/21 25.12 kg/m?  ?06/21/21 25.43 kg/m?  ?05/02/21 24.83 kg/m?  ? ? ?Assessment/Interventions: Review of patient past medical history, allergies, medications, health status, including review of consultants reports, laboratory and other test data, was performed as part of comprehensive evaluation and provision of chronic care management services.  ? ?SDOH:  (Social Determinants of Health) assessments and interventions performed: No ? ? ?SDOH Screenings  ? ?Alcohol Screen: Not on file  ?Depression (PHQ2-9): Low Risk   ? PHQ-2 Score: 0  ?Financial Resource  Strain: Low Risk   ? Difficulty of Paying Living Expenses: Not hard at all  ?Food Insecurity: No Food Insecurity  ? Worried About Charity fundraiser in the Last Year: Never true  ? Ran Out of Food in the Last Year: Never true  ?Housing: Low Risk   ? Last Housing Risk Score: 0  ?Physical  Activity: Sufficiently Active  ? Days of Exercise per Week: 5 days  ? Minutes of Exercise per Session: 30 min  ?Social Connections: Moderately Isolated  ? Frequency of Communication with Friends and Family: More than three times a week  ? Frequency of Social Gatherings with Friends and Family: More than three times a week  ? Attends Religious Services: Never  ? Active Member of Clubs or Organizations: No  ? Attends Archivist Meetings: Never  ? Marital Status: Married  ?Stress: No Stress Concern Present  ? Feeling of Stress : Not at all  ?Tobacco Use: Low Risk   ? Smoking Tobacco Use: Never  ? Smokeless Tobacco Use: Never  ? Passive Exposure: Not on file  ?Transportation Needs: No Transportation Needs  ? Lack of Transportation (Medical): No  ? Lack of Transportation (Non-Medical): No  ? ?  ? ?CCM Care Plan ? ?Allergies  ?Allergen Reactions  ? Codeine Other (See Comments)  ?  "made me sick"  ? ? ?Medications Reviewed Today   ? ? Reviewed by Viona Gilmore, [place] (Pharmacist) on 09/09/21 at 1613  Med List Status: <None>  ? ?Medication Order Taking? Sig Documenting Provider Last Dose Status Informant  ?acetaminophen (TYLENOL) 500 MG tablet [id number] No Take 500 mg by mouth daily as needed for moderate pain. [provider] Taking Active Self  ?aspirin 81 MG chewable tablet [id number] No Chew 1 tablet (81 mg total) by mouth daily. Barrett, Evelene Croon, PA-C Taking Active Self  ?calcium carbonate (TUMS - DOSED IN MG ELEMENTAL CALCIUM) 500 MG chewable tablet [id number] No Chew 2 tablets by mouth daily as needed for indigestion or heartburn. [provider] Taking Active Self  ?Cholecalciferol (VITAMIN D3) 2000  units TABS [id number] No Take 2,000 Units by mouth daily. [provider] Taking Active Self  ?gabapentin (NEURONTIN) 100 MG capsule [id number]  TAKE TWO CAPSULES AT BEDTIME. Caren Macadam, MD  A

## 2021-09-13 DIAGNOSIS — I10 Essential (primary) hypertension: Secondary | ICD-10-CM

## 2021-09-13 DIAGNOSIS — E785 Hyperlipidemia, unspecified: Secondary | ICD-10-CM | POA: Diagnosis not present

## 2021-11-29 ENCOUNTER — Other Ambulatory Visit: Payer: Self-pay | Admitting: *Deleted

## 2021-12-01 MED ORDER — GABAPENTIN 100 MG PO CAPS: ORAL_CAPSULE | ORAL | 0 refills | Status: DC

## 2022-01-03 DIAGNOSIS — I2510 Atherosclerotic heart disease of native coronary artery without angina pectoris: Secondary | ICD-10-CM | POA: Insufficient documentation

## 2022-01-03 NOTE — Progress Notes (Unsigned)
Established Patient Office Visit  Subjective   Patient ID: Bryan [name], male    DOB: 11-27-1945  Age: 76 y.o. MRN: [id number]  No chief complaint on file.   #Hypertension His high blood pressure was first noted {time:17447}. Home blood pressure readings: {home bp readings:17448}. Salt intake and diet: {salt intake/diet:17449}. Usual weight: ***. Associated signs and symptoms: {symptoms hypertension:17452}. Patient denies: {symptoms hypertension:19611}. Use of agents associated with hypertension: {meds:511}. Medication compliance: {med compliance:10573}.  {Common ambulatory SmartLinks:19316}  Coronary Artery Disease:  Patient presents for routine coronary artery disease follow-up.  Current symptoms: non-existent Pain radiation: none Patient also complains of no other symptoms. Pain aggravating factors: {chest pain provocation:11147} Pain relieved by {cad pain relief:11502}.  Cardiac risk factors include advanced age (older than 15 for men, 2 for women), dyslipidemia, hypertension, and male gender.        {History (Optional):23778}  Review of Systems  All other systems reviewed and are negative.     Objective:     There were no vitals taken for this visit. BP Readings from Last 3 Encounters:  07/08/21 122/80  06/21/21 102/60  05/02/21 124/82      Physical Exam Vitals reviewed.  Constitutional:      Appearance: Normal appearance. He is well-groomed and normal weight.  HENT:     Head: Normocephalic and atraumatic.  Eyes:     Extraocular Movements: Extraocular movements intact.     Conjunctiva/sclera: Conjunctivae normal.     Pupils: Pupils are equal, round, and reactive to light.  Cardiovascular:     Rate and Rhythm: Normal rate and regular rhythm.     Pulses: Normal pulses.     Heart sounds: S1 normal and S2 normal. No murmur heard. Pulmonary:     Effort: Pulmonary effort is normal.     Breath sounds: Normal breath sounds and air entry. No rales.   Abdominal:     General: Abdomen is flat. Bowel sounds are normal.     Palpations: Abdomen is soft.     Tenderness: There is no abdominal tenderness.  Musculoskeletal:     Right lower leg: No edema.     Left lower leg: No edema.  Neurological:     General: No focal deficit present.     Mental Status: He is alert and oriented to person, place, and time.     Gait: Gait is intact.     Deep Tendon Reflexes: Reflexes are normal and symmetric.  Psychiatric:        Mood and Affect: Mood and affect normal.      No results found for any visits on 01/06/22.  Last metabolic panel Lab Results  Component Value Date   GLUCOSE 73 06/21/2021   NA 140 06/21/2021   K 4.2 06/21/2021   CL 104 06/21/2021   CO2 27 06/21/2021   BUN 22 06/21/2021   CREATININE 1.09 06/21/2021   EGFR 74 04/12/2021   CALCIUM 9.8 06/21/2021   CALCIUM 10.0 06/21/2021   PROT 7.0 06/21/2021   ALBUMIN 4.4 06/21/2021   BILITOT 1.5 (H) 06/21/2021   ALKPHOS 49 06/21/2021   AST 26 06/21/2021   ALT 21 06/21/2021   ANIONGAP 10 01/18/2021   Last lipids Lab Results  Component Value Date   CHOL 125 01/17/2021   HDL 37 (L) 01/17/2021   LDLCALC 66 01/17/2021   LDLDIRECT 128.0 03/02/2019   TRIG 109 01/17/2021   CHOLHDL 3.4 01/17/2021      The ASCVD Risk score (Arnett DK, et al.,  2019) failed to calculate for the following reasons:   The patient has a prior MI or stroke diagnosis    Assessment & Plan:   Problem List Items Addressed This Visit       Cardiovascular and Mediastinum   Coronary atherosclerosis of native coronary artery (Chronic)    Patient reports stable symptoms, no chest pain episodes recently. On maximal medical therapy. Patient continues to follow with his cardiologist regularly. I reviewed his last set of labs and his las ECHO.       Essential hypertension - Primary    Blood pressure currently stable on losartan and metoprolol.       No follow-ups on file.    Farrel Conners, MD

## 2022-01-03 NOTE — Assessment & Plan Note (Signed)
Patient reports stable symptoms, no chest pain episodes recently. On maximal medical therapy. Patient continues to follow with his cardiologist regularly. I reviewed his last set of labs and his las ECHO.

## 2022-01-03 NOTE — Assessment & Plan Note (Signed)
Blood pressure currently stable on losartan and metoprolol.

## 2022-01-06 ENCOUNTER — Ambulatory Visit (INDEPENDENT_AMBULATORY_CARE_PROVIDER_SITE_OTHER): Payer: Medicare Other | Admitting: Family Medicine

## 2022-01-06 ENCOUNTER — Encounter: Payer: Self-pay | Admitting: Family Medicine

## 2022-01-06 VITALS — BP 108/60 | HR 67 | Temp 98.6°F | Ht 76.0 in | Wt 209.0 lb

## 2022-01-06 DIAGNOSIS — M48062 Spinal stenosis, lumbar region with neurogenic claudication: Secondary | ICD-10-CM

## 2022-01-06 DIAGNOSIS — I10 Essential (primary) hypertension: Secondary | ICD-10-CM | POA: Diagnosis not present

## 2022-01-06 DIAGNOSIS — I5022 Chronic systolic (congestive) heart failure: Secondary | ICD-10-CM | POA: Diagnosis not present

## 2022-01-06 DIAGNOSIS — I2510 Atherosclerotic heart disease of native coronary artery without angina pectoris: Secondary | ICD-10-CM | POA: Diagnosis not present

## 2022-01-06 NOTE — Assessment & Plan Note (Addendum)
No signs of fluid overload on exam, heart sounds WNL and there is no LE edema or crackles on exam. This is secondary to his AMI he sustained in August of 2022, he is already on ARB, BB and aspirin. He does not take diuretics currently. He will likely be getting a new ECHO and EKG at his cardiologist's office on Wednesday. Continue to monitor for signs of acute CHF.

## 2022-01-06 NOTE — Assessment & Plan Note (Signed)
Patient has had multiple surgeries on his back, last one was in 2019. He is taking tramadol every 6 hours and gabapentin at bedtime. He reports the medication is helping with his pain level. Will continue to refill his muscle relaxer for him as well. He cannot take NSAIDS due to hx of CAD/ AMI.  

## 2022-01-08 ENCOUNTER — Ambulatory Visit: Payer: Medicare Other | Admitting: Cardiovascular Disease

## 2022-01-08 ENCOUNTER — Encounter: Payer: Self-pay | Admitting: Cardiovascular Disease

## 2022-01-08 DIAGNOSIS — I2111 ST elevation (STEMI) myocardial infarction involving right coronary artery: Secondary | ICD-10-CM | POA: Diagnosis not present

## 2022-01-08 DIAGNOSIS — I10 Essential (primary) hypertension: Secondary | ICD-10-CM | POA: Diagnosis not present

## 2022-01-08 DIAGNOSIS — I7121 Aneurysm of the ascending aorta, without rupture: Secondary | ICD-10-CM | POA: Diagnosis not present

## 2022-01-08 DIAGNOSIS — E785 Hyperlipidemia, unspecified: Secondary | ICD-10-CM

## 2022-01-08 DIAGNOSIS — Z8719 Personal history of other diseases of the digestive system: Secondary | ICD-10-CM

## 2022-01-08 DIAGNOSIS — I255 Ischemic cardiomyopathy: Secondary | ICD-10-CM | POA: Diagnosis not present

## 2022-01-08 DIAGNOSIS — F419 Anxiety disorder, unspecified: Secondary | ICD-10-CM

## 2022-01-08 NOTE — Patient Instructions (Addendum)
Medication Instructions:  The current medical regimen is effective;  continue present plan and medications.  *If you need a refill on your cardiac medications before your next appointment, please call your pharmacy*   Lab Work: Come back tomorrow, fasting (nothing to eat or drink), no lab appointment needed -CBC, CMET, TSH, LIPID, LPa   If you have labs (blood work) drawn today and your tests are completely normal, you will receive your results only by: [facility] (if you have MyChart) OR A paper copy in the mail If you have any lab test that is abnormal or we need to change your treatment, we will call you to review the results.   Testing/Procedures: CTA Chest Aorta (they will call to get you scheduled) in January 2024   Follow-Up: At [facility], you and your health needs are our priority.  As part of our continuing mission to provide you with exceptional heart care, we have created designated Provider Care Teams.  These Care Teams include your primary Cardiologist (physician) and Advanced Practice Providers (APPs -  Physician Assistants and Nurse Practitioners) who all work together to provide you with the care you need, when you need it.  We recommend signing up for the patient portal called "MyChart".  Sign up information is provided on this After Visit Summary.  MyChart is used to connect with patients for Virtual Visits (Telemedicine).  Patients are able to view lab/test results, encounter notes, upcoming appointments, etc.  Non-urgent messages can be sent to your provider as well.   To learn more about what you can do with MyChart, go to [email or web address].    Your next appointment:   7 month(s)  The format for your next appointment:   In Person  Provider:   Shelva Majestic, MD
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
## 2022-01-09 ENCOUNTER — Other Ambulatory Visit: Payer: Self-pay | Admitting: Physician Assistant

## 2022-01-09 ENCOUNTER — Telehealth: Payer: Self-pay | Admitting: Pharmacist

## 2022-01-09 DIAGNOSIS — I7121 Aneurysm of the ascending aorta, without rupture: Secondary | ICD-10-CM | POA: Diagnosis not present

## 2022-01-09 DIAGNOSIS — E785 Hyperlipidemia, unspecified: Secondary | ICD-10-CM | POA: Diagnosis not present

## 2022-01-09 NOTE — Chronic Care Management (AMB) (Signed)
    Chronic Care Management Pharmacy Assistant    Moved hypertension assessment call, patient was seen with Dr. Legrand Como on 01/06/2022.  [place] Pharmacist Assistant [phone]

## 2022-01-10 LAB — COMPREHENSIVE METABOLIC PANEL
ALT: 24 IU/L (ref 0–44)
AST: 28 IU/L (ref 0–40)
Albumin/Globulin Ratio: 2 (ref 1.2–2.2)
Albumin: 4.3 g/dL (ref 3.8–4.8)
Alkaline Phosphatase: 54 IU/L (ref 44–121)
BUN/Creatinine Ratio: 16 (ref 10–24)
BUN: 19 mg/dL (ref 8–27)
Bilirubin Total: 1.7 mg/dL — ABNORMAL HIGH (ref 0.0–1.2)
CO2: 24 mmol/L (ref 20–29)
Calcium: 9.9 mg/dL (ref 8.6–10.2)
Chloride: 104 mmol/L (ref 96–106)
Creatinine, Ser: 1.19 mg/dL (ref 0.76–1.27)
Globulin, Total: 2.1 g/dL (ref 1.5–4.5)
Glucose: 95 mg/dL (ref 70–99)
Potassium: 4.7 mmol/L (ref 3.5–5.2)
Sodium: 141 mmol/L (ref 134–144)
Total Protein: 6.4 g/dL (ref 6.0–8.5)
eGFR: 64 mL/min/{1.73_m2} (ref >59)

## 2022-01-10 LAB — CBC
Hematocrit: 50.8 % (ref 37.5–51.0)
Hemoglobin: 17.3 g/dL (ref 13.0–17.7)
MCH: 31.2 pg (ref 26.6–33.0)
MCHC: 34.1 g/dL (ref 31.5–35.7)
MCV: 92 fL (ref 79–97)
Platelets: 194 10*3/uL (ref 150–450)
RBC: 5.55 x10E6/uL (ref 4.14–5.80)
RDW: 12.9 % (ref 11.6–15.4)
WBC: 5.6 10*3/uL (ref 3.4–10.8)

## 2022-01-10 LAB — LIPOPROTEIN A (LPA): Lipoprotein (a): <8.4 nmol/L (ref <75.0)

## 2022-01-10 LAB — LIPID PANEL
Chol/HDL Ratio: 1.8 ratio (ref 0.0–5.0)
Cholesterol, Total: 85 mg/dL — ABNORMAL LOW (ref 100–199)
HDL: 47 mg/dL (ref >39)
LDL Chol Calc (NIH): 20 mg/dL (ref 0–99)
Triglycerides: 89 mg/dL (ref 0–149)
VLDL Cholesterol Cal: 18 mg/dL (ref 5–40)

## 2022-01-10 LAB — TSH: TSH: 2.7 u[IU]/mL (ref 0.450–4.500)

## 2022-01-11 ENCOUNTER — Encounter: Payer: Self-pay | Admitting: Cardiovascular Disease

## 2022-01-13 ENCOUNTER — Other Ambulatory Visit: Payer: Self-pay | Admitting: Physician Assistant

## 2022-01-23 ENCOUNTER — Telehealth: Payer: Self-pay | Admitting: Cardiovascular Disease

## 2022-01-23 NOTE — Telephone Encounter (Signed)
Patient returned call for lab results.  

## 2022-01-23 NOTE — Telephone Encounter (Signed)
Patient made aware of results and verbalized understanding.  

## 2022-02-03 ENCOUNTER — Telehealth: Payer: Self-pay | Admitting: Family Medicine

## 2022-02-03 DIAGNOSIS — M48062 Spinal stenosis, lumbar region with neurogenic claudication: Secondary | ICD-10-CM

## 2022-02-03 MED ORDER — TRAMADOL HCL 50 MG PO TABS: 50.0000 mg | ORAL_TABLET | Freq: Four times a day (QID) | ORAL | 2 refills | Status: DC | PRN

## 2022-02-03 NOTE — Telephone Encounter (Signed)
Done

## 2022-02-03 NOTE — Telephone Encounter (Signed)
Pt is calling and would like a refill on traMADol Veatrice Bourbon) 50 MG tablet  [place], [place] Phone:  [phone]  Fax:  [phone]    Pt said he called on Saturday and  phone rung 3 times or 4 times and answering service did not pick up and he would like a callback also callback as to why the delay in getting a refill

## 2022-02-03 NOTE — Telephone Encounter (Signed)
Spoke with the patient and informed him the Rx was sent as below.  I advised the patient I am not sure what happened with the phone on Saturday; however requested he call during office hours Mon-Fri for refills if possible.  Patient stated he initially called his pharmacy for refills on Wednesday and was advised I did not see any refills that were sent electronically and the refill was sent today.

## 2022-02-04 DIAGNOSIS — L570 Actinic keratosis: Secondary | ICD-10-CM | POA: Diagnosis not present

## 2022-02-04 DIAGNOSIS — C4441 Basal cell carcinoma of skin of scalp and neck: Secondary | ICD-10-CM | POA: Diagnosis not present

## 2022-02-22 ENCOUNTER — Other Ambulatory Visit: Payer: Self-pay | Admitting: Family

## 2022-03-04 ENCOUNTER — Telehealth: Payer: Self-pay | Admitting: Cardiovascular Disease

## 2022-03-04 NOTE — Telephone Encounter (Signed)
Yes, he can continue to use Voltaren gel.  Risk of stroke is low.

## 2022-03-04 NOTE — Telephone Encounter (Signed)
Spoke to patient. Informed patient will send to  [facility] pharmacy team . Dr Claiborne Billings is out  of office . Patient states his using Voltaren gel at least twice a day .  If he can not use Voltaren what can he use and for how long

## 2022-03-04 NOTE — Telephone Encounter (Signed)
Patient is returning call.  °

## 2022-03-04 NOTE — Telephone Encounter (Signed)
  Pt c/o medication issue:  1. Name of Medication: voltaren gel  2. How are you currently taking this medication (dosage and times per day)? topical  3. Are you having a reaction (difficulty breathing--STAT)? No   4. What is your medication issue? Pt said, his knee has been hurting and he's been using voltaren half size of a lima bean 2x a day and been using several days now. However, he was just reading the medication and it says to call heart doctor because it may cause stroke. He is calling to check in with Dr. Claiborne Billings if its ok to continue using it.

## 2022-03-04 NOTE — Telephone Encounter (Signed)
Left detail message on patient 's voicemail .   Per Karren Cobble, [place]    Yes, he can continue to use Voltaren gel.  Risk of stroke is low.   Any question  may call back

## 2022-03-13 ENCOUNTER — Ambulatory Visit (INDEPENDENT_AMBULATORY_CARE_PROVIDER_SITE_OTHER): Payer: Medicare Other | Admitting: *Deleted

## 2022-03-13 DIAGNOSIS — Z23 Encounter for immunization: Secondary | ICD-10-CM | POA: Diagnosis not present

## 2022-03-17 ENCOUNTER — Ambulatory Visit (INDEPENDENT_AMBULATORY_CARE_PROVIDER_SITE_OTHER): Payer: Medicare Other

## 2022-03-17 VITALS — Ht 76.0 in | Wt 210.0 lb

## 2022-03-17 DIAGNOSIS — Z0000 Encounter for general adult medical examination without abnormal findings: Secondary | ICD-10-CM

## 2022-03-17 NOTE — Progress Notes (Signed)
I connected with Bryan [name] today by telephone and verified that I am speaking with the correct person using two identifiers. Location patient: home Location provider: work Persons participating in the virtual visit: Dorance Spink, Glenna Durand LPN.   I discussed the limitations, risks, security and privacy concerns of performing an evaluation and management service by telephone and the availability of in person appointments. I also discussed with the patient that there may be a patient responsible charge related to this service. The patient expressed understanding and verbally consented to this telephonic visit.    Interactive audio and video telecommunications were attempted between this provider and patient, however failed, due to patient having technical difficulties OR patient did not have access to video capability.  We continued and completed visit with audio only.     Vital signs may be patient reported or missing.  Subjective:   Bryan [name] is a 76 y.o. male who presents for Medicare Annual/Subsequent preventive examination.  Review of Systems     Cardiac Risk Factors include: advanced age (>75mn, >>59women);hypertension;male gender     Objective:    Today's Vitals   03/17/22 1611  Weight: 210 lb (95.3 kg)  Height: '6\' 4"'$  (1.93 m)   Body mass index is 25.56 kg/m.     03/17/2022    4:18 PM 03/13/2021    3:47 PM 01/09/2020    8:45 AM 05/21/2018    9:48 AM 02/09/2018    3:00 PM 02/02/2018    9:21 AM 11/30/2017   11:06 AM  Advanced Directives  Does Patient Have a Medical Advance Directive? No No No No No No No  Does patient want to make changes to medical advance directive?  No - Patient declined       Would patient like information on creating a medical advance directive?   No - Patient declined No - Patient declined No - Patient declined No - Patient declined No - Patient declined    Current Medications (verified) Outpatient Encounter Medications as of  03/17/2022  Medication Sig   acetaminophen (TYLENOL) 500 MG tablet Take 500 mg by mouth daily as needed for moderate pain.   aspirin 81 MG chewable tablet Chew 1 tablet (81 mg total) by mouth daily.   BRILINTA 90 MG TABS tablet TAKE ONE TABLET BY MOUTH TWICE DAILY   calcium carbonate (TUMS - DOSED IN MG ELEMENTAL CALCIUM) 500 MG chewable tablet Chew 2 tablets by mouth daily as needed for indigestion or heartburn.   Cholecalciferol (VITAMIN D3) 2000 units TABS Take 2,000 Units by mouth daily.   gabapentin (NEURONTIN) 100 MG capsule TAKE TWO CAPSULES BY MOUTH AT BEDTIME   losartan (COZAAR) 25 MG tablet Take '50mg'$  in the morning and '25mg'$  in the evening.   MAGNESIUM CITRATE PO Take by mouth daily.   Menthol, Topical Analgesic, (BIOFREEZE) 4 % GEL Apply 1 application topically daily as needed (pain).   metoprolol succinate (TOPROL-XL) 25 MG 24 hr tablet TAKE ONE TABLET BY MOUTH DAILY   Multiple Vitamins-Minerals (HAIR SKIN AND NAILS FORMULA PO) Take 1 tablet by mouth daily.   nitroGLYCERIN (NITROSTAT) 0.4 MG SL tablet Place 1 tablet (0.4 mg total) under the tongue every 5 (five) minutes as needed for chest pain.   Omega-3 Fatty Acids (FISH OIL) 1200 MG CAPS Take 1 capsule (1,200 mg total) by mouth daily.   rosuvastatin (CRESTOR) 40 MG tablet TAKE ONE TABLET BY MOUTH DAILY   tiZANidine (ZANAFLEX) 4 MG tablet Take 1 tablet (4 mg total)  by mouth every 8 (eight) hours as needed for muscle spasms.   traMADol (ULTRAM) 50 MG tablet Take 1 tablet (50 mg total) by mouth every 6 (six) hours as needed for moderate pain.   vitamin B-12 (CYANOCOBALAMIN) 1000 MCG tablet Take 1,000 mcg by mouth daily.   vitamin C (ASCORBIC ACID) 250 MG tablet Take 250 mg by mouth daily.   No facility-administered encounter medications on file as of 03/17/2022.    Allergies (verified) Codeine   History: Past Medical History:  Diagnosis Date   Anxiety    Cancer ([place])    facial skin cancer   Depression    GERD  (gastroesophageal reflux disease)    takes tums rarely   Hematuria    1 time in july 2019   Hypertension    Osteoarthritis    Spinal stenosis    ST elevation myocardial infarction involving right coronary artery [facility])    Past Surgical History:  Procedure Laterality Date   ACHILLES TENDON SURGERY  02/2006   ANTERIOR LAT LUMBAR FUSION N/A 11/11/2017   Procedure: LUMBAR TWO- LUMBAR THREE, LUMBAR THREE- LUMBAR FOUR, LUMBAR FOUR- LUMBAR FIVE ANTERIOLATERAL INTERBODY ARTHRODESIS;  Surgeon: Jovita Gamma, MD;  Location: [place];  Service: Neurosurgery;  Laterality: N/A;   COLONOSCOPY  2003   COLONOSCOPY     CORONARY STENT INTERVENTION N/A 01/17/2021   Procedure: CORONARY STENT INTERVENTION;  Surgeon: Troy Sine, MD;  Location: [place] CV LAB;  Service: Cardiovascular;  Laterality: N/A;   CORONARY/GRAFT ACUTE MI REVASCULARIZATION N/A 01/17/2021   Procedure: Coronary/Graft Acute MI Revascularization;  Surgeon: Troy Sine, MD;  Location: [place] CV LAB;  Service: Cardiovascular;  Laterality: N/A;   CYSTOSCOPY N/A 05/21/2018   Procedure: CYSTOSCOPY FLEXIBLE;  Surgeon: Festus Aloe, MD;  Location: [facility];  Service: Urology;  Laterality: N/A;   KNEE SURGERY Left 1991 and 2001   X2 arthroscopy   LEFT HEART CATH AND CORONARY ANGIOGRAPHY N/A 01/17/2021   Procedure: LEFT HEART CATH AND CORONARY ANGIOGRAPHY;  Surgeon: Troy Sine, MD;  Location: [place] CV LAB;  Service: Cardiovascular;  Laterality: N/A;   LUMBAR PERCUTANEOUS PEDICLE SCREW 3 LEVEL N/A 11/11/2017   Procedure: LUMBAR PERCUTANEOUS PEDICLE SCREW PLACEMENT LUMBAR TWO-THREE, LUMBER THREE-FOUR, LUMBAR FOUR-FIVE;  Surgeon: Jovita Gamma, MD;  Location: [place];  Service: Neurosurgery;  Laterality: N/A;   SPINE SURGERY  12-08, 5-10   TOTAL HIP ARTHROPLASTY     oct 2006,   TOTAL HIP ARTHROPLASTY Right 02/09/2018   Procedure: RIGHT TOTAL HIP ARTHROPLASTY ANTERIOR APPROACH;  Surgeon: Paralee Cancel, MD;   Location: [facility];  Service: Orthopedics;  Laterality: Right;   Family History  Problem Relation Age of Onset   Diabetes Mother    Coronary artery disease Mother 22   Hypertension Mother    Other Father 29   Colon polyps Neg Hx    Esophageal cancer Neg Hx    Rectal cancer Neg Hx    Stomach cancer Neg Hx    Social History   Socioeconomic History   Marital status: Married    Spouse name: Not on file   Number of children: Not on file   Years of education: Not on file   Highest education level: Not on file  Occupational History   Not on file  Tobacco Use   Smoking status: Never   Smokeless tobacco: Never  Vaping Use   Vaping Use: Never used  Substance and Sexual Activity   Alcohol use: No  Drug use: No   Sexual activity: Not on file  Other Topics Concern   Not on file  Social History Narrative   Divorced, Exercises regularly   Social Determinants of Health   Financial Resource Strain: Low Risk  (03/17/2022)   Overall Financial Resource Strain (CARDIA)    Difficulty of Paying Living Expenses: Not hard at all  Food Insecurity: No Food Insecurity (03/17/2022)   Hunger Vital Sign    Worried About Running Out of Food in the Last Year: Never true    Ran Out of Food in the Last Year: Never true  Transportation Needs: No Transportation Needs (03/17/2022)   PRAPARE - Hydrologist (Medical): No    Lack of Transportation (Non-Medical): No  Physical Activity: Sufficiently Active (03/17/2022)   Exercise Vital Sign    Days of Exercise per Week: 5 days    Minutes of Exercise per Session: 30 min  Stress: No Stress Concern Present (03/17/2022)   [place]    Feeling of Stress : Not at all  Social Connections: Moderately Isolated (03/13/2021)   Social Connection and Isolation Panel [NHANES]    Frequency of Communication with Friends and Family: More than three times a week    Frequency of  Social Gatherings with Friends and Family: More than three times a week    Attends Religious Services: Never    Marine scientist or Organizations: No    Attends Music therapist: Never    Marital Status: Married    Tobacco Counseling Counseling given: Not Answered   Clinical Intake:  Pre-visit preparation completed: Yes  Pain : No/denies pain     Nutritional Status: BMI 25 -29 Overweight Nutritional Risks: None Diabetes: No  How often do you need to have someone help you when you read instructions, pamphlets, or other written materials from your doctor or pharmacy?: 1 - Never  Diabetic?no  Interpreter Needed?: No  Information entered by :: NAllen LPN   Activities of Daily Living    03/17/2022    4:19 PM  In your present state of health, do you have any difficulty performing the following activities:  Hearing? 0  Vision? 0  Difficulty concentrating or making decisions? 0  Walking or climbing stairs? 0  Dressing or bathing? 0  Doing errands, shopping? 0  Preparing Food and eating ? N  Using the Toilet? N  In the past six months, have you accidently leaked urine? N  Do you have problems with loss of bowel control? N  Managing your Medications? N  Managing your Finances? N  Housekeeping or managing your Housekeeping? N    Patient Care Team: Farrel Conners, MD as PCP - General (Family Medicine) Troy Sine, MD as PCP - Cardiology (Cardiology) Festus Aloe, MD as Consulting Physician (Urology) Danella Sensing, MD as Consulting Physician (Dermatology) Michael Boston, MD as Consulting Physician (General Surgery) Paralee Cancel, MD as Consulting Physician (Orthopedic Surgery) Jovita Gamma, MD as Consulting Physician (Neurosurgery) Viona Gilmore, [facility] as Pharmacist (Pharmacist)  Indicate any recent Medical Services you may have received from other than [facility] providers in the past year (date may be approximate).     Assessment:    This is a routine wellness examination for Conner.  Hearing/Vision screen Vision Screening - Comments:: Regular eye exams, Dr. Katy Fitch   Dietary issues and exercise activities discussed: Current Exercise Habits: Home exercise routine, Type of exercise: Other - see  comments (swimming), Time (Minutes): 30, Frequency (Times/Week): 5, Weekly Exercise (Minutes/Week): 150   Goals Addressed             This Visit's Progress    Patient Stated       03/17/2022, stay healthy       Depression Screen    03/17/2022    4:19 PM 01/06/2022    7:58 AM 06/21/2021    8:16 AM 03/13/2021    3:46 PM 12/19/2020   10:00 AM 03/05/2020    7:55 AM 01/09/2020    8:47 AM  PHQ 2/9 Scores  PHQ - 2 Score 0 1 0 0 0 0 0  PHQ- 9 Score  1 0  1  0    Fall Risk    03/17/2022    4:19 PM 03/13/2021    3:49 PM 12/19/2020    8:44 AM 03/05/2020    7:55 AM 01/09/2020    8:46 AM  Fall Risk   Falls in the past year? 0 0 0 0 0  Number falls in past yr: 0 0 0 0 0  Injury with Fall? 0 0 0 0 0  Risk for fall due to : Medication side effect Impaired vision   Medication side effect;Orthopedic patient  Follow up Falls prevention discussed;Education provided;Falls evaluation completed Falls prevention discussed   Falls evaluation completed;Falls prevention discussed    FALL RISK PREVENTION PERTAINING TO THE HOME:  Any stairs in or around the home? Yes  If so, are there any without handrails? Yes  Home free of loose throw rugs in walkways, pet beds, electrical cords, etc? Yes  Adequate lighting in your home to reduce risk of falls? Yes   ASSISTIVE DEVICES UTILIZED TO PREVENT FALLS:  Life alert? No  Use of a cane, walker or w/c? No  Grab bars in the bathroom? No  Shower chair or bench in shower? Yes  Elevated toilet seat or a handicapped toilet? No   TIMED UP AND GO:  Was the test performed? No .      Cognitive Function:        03/17/2022    4:21 PM 03/13/2021    3:51 PM  6CIT Screen  What Year? 0 points 0  points  What month? 0 points 0 points  What time? 0 points 0 points  Count back from 20 0 points 0 points  Months in reverse 0 points 0 points  Repeat phrase 2 points 0 points  Total Score 2 points 0 points    Immunizations Immunization History  Administered Date(s) Administered   Fluad Quad(high Dose 65+) 03/02/2019, 03/05/2020, 03/20/2021, 03/13/2022   Influenza Split 03/30/2013   Influenza, High Dose Seasonal PF 03/26/2015, 03/25/2016, 04/13/2017, 03/23/2018   Influenza,inj,Quad PF,6+ Mos 03/21/2014   PFIZER(Purple Top)SARS-COV-2 Vaccination 07/10/2019, 08/01/2019, 03/31/2020   PNEUMOCOCCAL CONJUGATE-20 06/21/2021   Pneumococcal Conjugate-13 03/21/2014   Pneumococcal Polysaccharide-23 03/26/2015   Td 07/19/2009, 05/24/2020   Zoster Recombinat (Shingrix) 09/14/2020, 02/15/2021    TDAP status: Up to date  Flu Vaccine status: Up to date  Pneumococcal vaccine status: Up to date  Covid-19 vaccine status: Completed vaccines  Qualifies for Shingles Vaccine? Yes   Zostavax completed Yes   Shingrix Completed?: Yes  Screening Tests Health Maintenance  Topic Date Due   COVID-19 Vaccine (4 - Pfizer series) 05/26/2020   Hepatitis C Screening  06/21/2022 (Originally 06/05/1964)   COLONOSCOPY (Pts 45-34yr Insurance coverage will need to be confirmed)  10/26/2022   TETANUS/TDAP  05/24/2030   Pneumonia Vaccine  43+ Years old  Completed   INFLUENZA VACCINE  Completed   Zoster Vaccines- Shingrix  Completed   HPV VACCINES  Aged Out    Health Maintenance  Health Maintenance Due  Topic Date Due   COVID-19 Vaccine (4 - Pfizer series) 05/26/2020    Colorectal cancer screening: Type of screening: Colonoscopy. Completed 10/25/2012. Repeat every 10 years  Lung Cancer Screening: (Low Dose CT Chest recommended if Age 74-80 years, 30 pack-year currently smoking OR have quit w/in 15years.) does not qualify.   Lung Cancer Screening Referral: no  Additional Screening:  Hepatitis C  Screening: does qualify;   Vision Screening: Recommended annual ophthalmology exams for early detection of glaucoma and other disorders of the eye. Is the patient up to date with their annual eye exam?  Yes  Who is the provider or what is the name of the office in which the patient attends annual eye exams? Dr. Katy Fitch If pt is not established with a provider, would they like to be referred to a provider to establish care? No .   Dental Screening: Recommended annual dental exams for proper oral hygiene  Community Resource Referral / Chronic Care Management: CRR required this visit?  No   CCM required this visit?  No      Plan:     I have personally reviewed and noted the following in the patient's chart:   Medical and social history Use of alcohol, tobacco or illicit drugs  Current medications and supplements including opioid prescriptions. Patient is not currently taking opioid prescriptions. Functional ability and status Nutritional status Physical activity Advanced directives List of other physicians Hospitalizations, surgeries, and ER visits in previous 12 months Vitals Screenings to include cognitive, depression, and falls Referrals and appointments  In addition, I have reviewed and discussed with patient certain preventive protocols, quality metrics, and best practice recommendations. A written personalized care plan for preventive services as well as general preventive health recommendations were provided to patient.     Kellie Simmering, LPN   03/0/0923   Nurse Notes: none  Due to this being a virtual visit, the after visit summary with patients personalized plan was offered to patient via mail or my-chart.  to pick up at office at next visit

## 2022-03-17 NOTE — Patient Instructions (Signed)
Bryan [name] , Thank you for taking time to come for your Medicare Wellness Visit. I appreciate your ongoing commitment to your health goals. Please review the following plan we discussed and let me know if I can assist you in the future.   Screening recommendations/referrals: Colonoscopy: completed 10/25/2012, due 10/26/2022 Recommended yearly ophthalmology/optometry visit for glaucoma screening and checkup Recommended yearly dental visit for hygiene and checkup  Vaccinations: Influenza vaccine: completed 03/13/2022 Pneumococcal vaccine: completed 06/21/2021 Tdap vaccine: completed 05/24/2020, due 05/24/2030 Shingles vaccine: completed   Covid-19:  03/31/2020, 08/01/2019, 07/10/2019  Advanced directives: Advance directive discussed with you today.   Conditions/risks identified: none  Next appointment: Follow up in one year for your annual wellness visit.   Preventive Care 19 Years and Older, Male Preventive care refers to lifestyle choices and visits with your health care provider that can promote health and wellness. What does preventive care include? A yearly physical exam. This is also called an annual well check. Dental exams once or twice a year. Routine eye exams. Ask your health care provider how often you should have your eyes checked. Personal lifestyle choices, including: Daily care of your teeth and gums. Regular physical activity. Eating a healthy diet. Avoiding tobacco and drug use. Limiting alcohol use. Practicing safe sex. Taking low doses of aspirin every day. Taking vitamin and mineral supplements as recommended by your health care provider. What happens during an annual well check? The services and screenings done by your health care provider during your annual well check will depend on your age, overall health, lifestyle risk factors, and family history of disease. Counseling  Your health care provider may ask you questions about your: Alcohol use. Tobacco use. Drug  use. Emotional well-being. Home and relationship well-being. Sexual activity. Eating habits. History of falls. Memory and ability to understand (cognition). Work and work Statistician. Screening  You may have the following tests or measurements: Height, weight, and BMI. Blood pressure. Lipid and cholesterol levels. These may be checked every 5 years, or more frequently if you are over 4 years old. Skin check. Lung cancer screening. You may have this screening every year starting at age 69 if you have a 30-pack-year history of smoking and currently smoke or have quit within the past 15 years. Fecal occult blood test (FOBT) of the stool. You may have this test every year starting at age 38. Flexible sigmoidoscopy or colonoscopy. You may have a sigmoidoscopy every 5 years or a colonoscopy every 10 years starting at age 65. Prostate cancer screening. Recommendations will vary depending on your family history and other risks. Hepatitis C blood test. Hepatitis B blood test. Sexually transmitted disease (STD) testing. Diabetes screening. This is done by checking your blood sugar (glucose) after you have not eaten for a while (fasting). You may have this done every 1-3 years. Abdominal aortic aneurysm (AAA) screening. You may need this if you are a current or former smoker. Osteoporosis. You may be screened starting at age 25 if you are at high risk. Talk with your health care provider about your test results, treatment options, and if necessary, the need for more tests. Vaccines  Your health care provider may recommend certain vaccines, such as: Influenza vaccine. This is recommended every year. Tetanus, diphtheria, and acellular pertussis (Tdap, Td) vaccine. You may need a Td booster every 10 years. Zoster vaccine. You may need this after age 76. Pneumococcal 13-valent conjugate (PCV13) vaccine. One dose is recommended after age 11. Pneumococcal polysaccharide (PPSV23) vaccine. One dose  is  recommended after age 63. Talk to your health care provider about which screenings and vaccines you need and how often you need them. This information is not intended to replace advice given to you by your health care provider. Make sure you discuss any questions you have with your health care provider. Document Released: 06/29/2015 Document Revised: 02/20/2016 Document Reviewed: 04/03/2015 Elsevier Interactive Patient Education  2017 [place] Prevention in the Home Falls can cause injuries. They can happen to people of all ages. There are many things you can do to make your home safe and to help prevent falls. What can I do on the outside of my home? Regularly fix the edges of walkways and driveways and fix any cracks. Remove anything that might make you trip as you walk through a door, such as a raised step or threshold. Trim any bushes or trees on the path to your home. Use bright outdoor lighting. Clear any walking paths of anything that might make someone trip, such as rocks or tools. Regularly check to see if handrails are loose or broken. Make sure that both sides of any steps have handrails. Any raised decks and porches should have guardrails on the edges. Have any leaves, snow, or ice cleared regularly. Use sand or salt on walking paths during winter. Clean up any spills in your garage right away. This includes oil or grease spills. What can I do in the bathroom? Use night lights. Install grab bars by the toilet and in the tub and shower. Do not use towel bars as grab bars. Use non-skid mats or decals in the tub or shower. If you need to sit down in the shower, use a plastic, non-slip stool. Keep the floor dry. Clean up any water that spills on the floor as soon as it happens. Remove soap buildup in the tub or shower regularly. Attach bath mats securely with double-sided non-slip rug tape. Do not have throw rugs and other things on the floor that can make you  trip. What can I do in the bedroom? Use night lights. Make sure that you have a light by your bed that is easy to reach. Do not use any sheets or blankets that are too big for your bed. They should not hang down onto the floor. Have a firm chair that has side arms. You can use this for support while you get dressed. Do not have throw rugs and other things on the floor that can make you trip. What can I do in the kitchen? Clean up any spills right away. Avoid walking on wet floors. Keep items that you use a lot in easy-to-reach places. If you need to reach something above you, use a strong step stool that has a grab bar. Keep electrical cords out of the way. Do not use floor polish or wax that makes floors slippery. If you must use wax, use non-skid floor wax. Do not have throw rugs and other things on the floor that can make you trip. What can I do with my stairs? Do not leave any items on the stairs. Make sure that there are handrails on both sides of the stairs and use them. Fix handrails that are broken or loose. Make sure that handrails are as long as the stairways. Check any carpeting to make sure that it is firmly attached to the stairs. Fix any carpet that is loose or worn. Avoid having throw rugs at the top or bottom of the stairs.  If you do have throw rugs, attach them to the floor with carpet tape. Make sure that you have a light switch at the top of the stairs and the bottom of the stairs. If you do not have them, ask someone to add them for you. What else can I do to help prevent falls? Wear shoes that: Do not have high heels. Have rubber bottoms. Are comfortable and fit you well. Are closed at the toe. Do not wear sandals. If you use a stepladder: Make sure that it is fully opened. Do not climb a closed stepladder. Make sure that both sides of the stepladder are locked into place. Ask someone to hold it for you, if possible. Clearly mark and make sure that you can  see: Any grab bars or handrails. First and last steps. Where the edge of each step is. Use tools that help you move around (mobility aids) if they are needed. These include: Canes. Walkers. Scooters. Crutches. Turn on the lights when you go into a dark area. Replace any light bulbs as soon as they burn out. Set up your furniture so you have a clear path. Avoid moving your furniture around. If any of your floors are uneven, fix them. If there are any pets around you, be aware of where they are. Review your medicines with your doctor. Some medicines can make you feel dizzy. This can increase your chance of falling. Ask your doctor what other things that you can do to help prevent falls. This information is not intended to replace advice given to you by your health care provider. Make sure you discuss any questions you have with your health care provider. Document Released: 03/29/2009 Document Revised: 11/08/2015 Document Reviewed: 07/07/2014 Elsevier Interactive Patient Education  2017 [employer].

## 2022-04-11 ENCOUNTER — Telehealth: Payer: Self-pay | Admitting: Pharmacist

## 2022-04-11 DIAGNOSIS — R350 Frequency of micturition: Secondary | ICD-10-CM | POA: Diagnosis not present

## 2022-04-11 NOTE — Chronic Care Management (AMB) (Signed)
Chronic Care Management Pharmacy Assistant   Name: Bryan [name]  MRN: [id number] DOB: 1946/04/05  Reason for Encounter: Disease State / Hypertension Assessment Call   Conditions to be addressed/monitored: HTN  Recent office visits:  03/17/2022 Glenna Durand LPN - Medicare annual wellness exam  Recent consult visits:  None  Hospital visits:  None  Medications: Outpatient Encounter Medications as of 04/11/2022  Medication Sig   acetaminophen (TYLENOL) 500 MG tablet Take 500 mg by mouth daily as needed for moderate pain.   aspirin 81 MG chewable tablet Chew 1 tablet (81 mg total) by mouth daily.   BRILINTA 90 MG TABS tablet TAKE ONE TABLET BY MOUTH TWICE DAILY   calcium carbonate (TUMS - DOSED IN MG ELEMENTAL CALCIUM) 500 MG chewable tablet Chew 2 tablets by mouth daily as needed for indigestion or heartburn.   Cholecalciferol (VITAMIN D3) 2000 units TABS Take 2,000 Units by mouth daily.   gabapentin (NEURONTIN) 100 MG capsule TAKE TWO CAPSULES BY MOUTH AT BEDTIME   losartan (COZAAR) 25 MG tablet Take '50mg'$  in the morning and '25mg'$  in the evening.   MAGNESIUM CITRATE PO Take by mouth daily.   Menthol, Topical Analgesic, (BIOFREEZE) 4 % GEL Apply 1 application topically daily as needed (pain).   metoprolol succinate (TOPROL-XL) 25 MG 24 hr tablet TAKE ONE TABLET BY MOUTH DAILY   Multiple Vitamins-Minerals (HAIR SKIN AND NAILS FORMULA PO) Take 1 tablet by mouth daily.   nitroGLYCERIN (NITROSTAT) 0.4 MG SL tablet Place 1 tablet (0.4 mg total) under the tongue every 5 (five) minutes as needed for chest pain.   Omega-3 Fatty Acids (FISH OIL) 1200 MG CAPS Take 1 capsule (1,200 mg total) by mouth daily.   rosuvastatin (CRESTOR) 40 MG tablet TAKE ONE TABLET BY MOUTH DAILY   tiZANidine (ZANAFLEX) 4 MG tablet Take 1 tablet (4 mg total) by mouth every 8 (eight) hours as needed for muscle spasms.   traMADol (ULTRAM) 50 MG tablet Take 1 tablet (50 mg total) by mouth every 6 (six) hours as  needed for moderate pain.   vitamin B-12 (CYANOCOBALAMIN) 1000 MCG tablet Take 1,000 mcg by mouth daily.   vitamin C (ASCORBIC ACID) 250 MG tablet Take 250 mg by mouth daily.   No facility-administered encounter medications on file as of 04/11/2022.  Fill History:  GABAPENTIN  100 MG CAPS 02/26/2022 90   LOSARTAN POTASSIUM  50 MG TABS 02/16/2022 100   METOPROLOL SUCCINATE ER  25 MG TB24 01/14/2022 90   NITROGLYCERIN  0.4 MG SUBL 01/19/2021 30   ROSUVASTATIN CALCIUM  40 MG TABS 01/14/2022 90   TIZANIDINE HYDROCHLORIDE  4 MG TABS 11/23/2021 30   TRAMADOL HYDROCHLORIDE  50 MG TABS 03/18/2022 30   Reviewed chart prior to disease state call. Spoke with patient regarding BP  Recent Office Vitals: BP Readings from Last 3 Encounters:  01/08/22 130/78  01/06/22 108/60  07/08/21 122/80   Pulse Readings from Last 3 Encounters:  01/08/22 63  01/06/22 67  07/08/21 66    Wt Readings from Last 3 Encounters:  03/17/22 210 lb (95.3 kg)  01/08/22 210 lb (95.3 kg)  01/06/22 209 lb (94.8 kg)     Kidney Function Lab Results  Component Value Date/Time   CREATININE 1.19 01/09/2022 09:40 AM   CREATININE 1.09 06/21/2021 09:26 AM   CREATININE 1.13 03/05/2020 08:39 AM   GFR 67.55 06/21/2021 09:26 AM   GFRNONAA >60 01/18/2021 01:25 AM   GFRAA >60 02/10/2018 04:53 AM  Latest Ref Rng & Units 01/09/2022    9:40 AM 06/21/2021    9:26 AM 04/12/2021   12:20 PM  BMP  Glucose 70 - 99 mg/dL 95  73  76   BUN 8 - 27 mg/dL '19  22  16   '$ Creatinine 0.76 - 1.27 mg/dL 1.19  1.09  1.07   BUN/Creat Ratio 10 - '24 16   15   '$ Sodium 134 - 144 mmol/L 141  140  147   Potassium 3.5 - 5.2 mmol/L 4.7  4.2  4.7   Chloride 96 - 106 mmol/L 104  104  106   CO2 20 - 29 mmol/L '24  27  27   '$ Calcium 8.6 - 10.2 mg/dL 9.9  9.8    10.0  10.5     Current antihypertensive regimen:  Losartan 25 mg 2 tablets every AM and 1 tablet every PM Metoprolol 25 mg daily  How often are you checking your Blood Pressure?  Patient is checking blood pressures about once weekly  Current home BP readings: Blood pressure reading today, 04/11/22 was 120/70, he doesn't keep a log however, he states his readings are always between 110/75 and 130/80.  What recent interventions/DTPs have been made by any provider to improve Blood Pressure control since last CPP Visit: No recent interventions  Any recent hospitalizations or ED visits since last visit with CPP? No recent hospital visits.  What diet changes have been made to improve Blood Pressure Control?  Patient doesn't eat fried food Breakfast - patient will have cheerios and almond milk  Lunch - patient will have a meat and vegetable Dinner - patient will have a variety containing a meat and vegetable  What exercise is being done to improve your Blood Pressure Control?  Patient swims / water exercise daily for 30 minutes.   Adherence Review: Is the patient currently on ACE/ARB medication? Yes Does the patient have >5 day gap between last estimated fill dates? No   Care Gaps: AWV - completed 03/17/2022 Last BP - 130/78 01/08/2022 Covid - overdue Hep C Screen - postponed  Star Rating Drugs: Rosuvastatin 40 mg - last filled 01/14/2022 90 DS at [facility] Losartan 50 mg - last filled 02/16/2022 100 DS at [place] Pharmacist Assistant [phone]

## 2022-05-01 DIAGNOSIS — L923 Foreign body granuloma of the skin and subcutaneous tissue: Secondary | ICD-10-CM | POA: Diagnosis not present

## 2022-05-20 ENCOUNTER — Other Ambulatory Visit: Payer: Self-pay | Admitting: Family Medicine

## 2022-05-28 ENCOUNTER — Other Ambulatory Visit: Payer: Self-pay | Admitting: Cardiovascular Disease

## 2022-06-11 ENCOUNTER — Other Ambulatory Visit: Payer: Self-pay | Admitting: Family Medicine

## 2022-06-11 DIAGNOSIS — M48062 Spinal stenosis, lumbar region with neurogenic claudication: Secondary | ICD-10-CM

## 2022-06-19 ENCOUNTER — Ambulatory Visit
Admission: RE | Admit: 2022-06-19 | Discharge: 2022-06-19 | Disposition: A | Discharge status: Home / Self Care | Payer: Medicare Other | Source: Ambulatory Visit | Attending: Cardiovascular Disease | Admitting: Cardiovascular Disease

## 2022-06-19 DIAGNOSIS — I2510 Atherosclerotic heart disease of native coronary artery without angina pectoris: Secondary | ICD-10-CM | POA: Diagnosis not present

## 2022-06-19 DIAGNOSIS — R911 Solitary pulmonary nodule: Secondary | ICD-10-CM | POA: Diagnosis not present

## 2022-06-19 DIAGNOSIS — E785 Hyperlipidemia, unspecified: Secondary | ICD-10-CM

## 2022-06-19 DIAGNOSIS — I700 Atherosclerosis of aorta: Secondary | ICD-10-CM | POA: Diagnosis not present

## 2022-06-19 DIAGNOSIS — I7121 Aneurysm of the ascending aorta, without rupture: Secondary | ICD-10-CM

## 2022-06-19 DIAGNOSIS — I712 Thoracic aortic aneurysm, without rupture, unspecified: Secondary | ICD-10-CM | POA: Diagnosis not present

## 2022-06-19 MED ORDER — IOPAMIDOL (ISOVUE-370) INJECTION 76%
75.0000 mL | Freq: Once | INTRAVENOUS | Status: AC | PRN
  Administered 2022-06-19: 75 mL via INTRAVENOUS

## 2022-07-03 ENCOUNTER — Telehealth: Payer: Self-pay | Admitting: Cardiovascular Disease

## 2022-07-03 NOTE — Telephone Encounter (Signed)
Bryan Sine, MD 06/20/2022 12:15 PM EST     Aortic root dilation at 4.7 cm, slightly increased from prior evaluation.  Coronary artery calcifications.  Stable 7 mm right lower lobe nodule

## 2022-07-03 NOTE — Telephone Encounter (Signed)
Pt would like a callback regarding Ct results. Please advise

## 2022-07-03 NOTE — Telephone Encounter (Signed)
Patient states he has not received the letter in regards to the results   RN  gave patient results from Dr Claiborne Billings. Patient verbalized understanding.  Patient states he has an appointment in May with Dr Claiborne Billings.

## 2022-07-09 ENCOUNTER — Encounter: Payer: Self-pay | Admitting: Family Medicine

## 2022-07-09 ENCOUNTER — Ambulatory Visit (INDEPENDENT_AMBULATORY_CARE_PROVIDER_SITE_OTHER): Payer: Medicare Other | Admitting: Family Medicine

## 2022-07-09 VITALS — BP 112/68 | HR 70 | Temp 98.6°F | Ht 76.0 in | Wt 208.7 lb

## 2022-07-09 DIAGNOSIS — I10 Essential (primary) hypertension: Secondary | ICD-10-CM

## 2022-07-09 DIAGNOSIS — M48062 Spinal stenosis, lumbar region with neurogenic claudication: Secondary | ICD-10-CM | POA: Diagnosis not present

## 2022-07-09 DIAGNOSIS — Z1211 Encounter for screening for malignant neoplasm of colon: Secondary | ICD-10-CM | POA: Diagnosis not present

## 2022-07-09 NOTE — Progress Notes (Signed)
Established Patient Office Visit  Subjective   Patient ID: Bryan [name], male    DOB: 09-18-1945  Age: 77 y.o. MRN: [id number]  Chief Complaint  Patient presents with   Medical Management of Chronic Issues    Patient is here for 6 month follow up for medication refills.   Chronic back pain- patient continues on tramadol 50 mg four times a day as need and 200 mg of gabapentin at nighttime. Pt reports it is managing his pain fairly well, it makes it tolerable for him. Is asking about supplements to take for stress relief. He brought one in today for me to look at, it has GABA and L-theanine. We discussed the risks/benefits of taking the supplement. I mentioned that he was already taking gabapentin and that the supplement may not be of benefit-- there are no studies formally done on these kinds of supplements.  HTN -- BP in office performed and is well controlled. He  reports no side effects to the medications, no chest pain, SOB, dizziness or headaches. He has a BP cuff at home and is checking BP regularly, reports they are in the normal range.   Pt states that otherwise he is in his normal state of health, no changes since his last visit.    Current Outpatient Medications  Medication Instructions   acetaminophen (TYLENOL) 500 mg, Oral, Daily PRN   aspirin 81 mg, Oral, Daily   Brilinta 90 mg, Oral, 2 times daily   calcium carbonate (TUMS - DOSED IN MG ELEMENTAL CALCIUM) 500 MG chewable tablet 2 tablets, Oral, Daily PRN   cyanocobalamin (VITAMIN B12) 1,000 mcg, Oral, Daily   Fish Oil 1,200 mg, Oral, Daily   gabapentin (NEURONTIN) 100 MG capsule TAKE TWO CAPSULES BY MOUTH AT BEDTIME   losartan (COZAAR) 50 MG tablet TAKE ONE TABLET EVERY MORNING AND TAKE 1/2 TABLET EVERY EVENING   MAGNESIUM CITRATE PO Oral, Daily   Menthol, Topical Analgesic, (BIOFREEZE) 4 % GEL 1 application , Apply externally, Daily PRN   metoprolol succinate (TOPROL-XL) 25 mg, Oral, Daily   Multiple  Vitamins-Minerals (HAIR SKIN AND NAILS FORMULA PO) 1 tablet, Oral, Daily   nitroGLYCERIN (NITROSTAT) 0.4 mg, Sublingual, Every 5 min PRN   rosuvastatin (CRESTOR) 40 mg, Oral, Daily   tiZANidine (ZANAFLEX) 4 mg, Oral, Every 8 hours PRN   traMADol (ULTRAM) 50 MG tablet TAKE ONE TABLET BY MOUTH EVERY 6 HOURS AS NEEDED FOR moderate pain   vitamin C (ASCORBIC ACID) 250 mg, Oral, Daily   Vitamin D3 2,000 Units, Oral, Daily    Patient Active Problem List   Diagnosis Date Noted   Coronary atherosclerosis of native coronary artery 01/03/2022    Priority: High   Chronic systolic congestive heart failure ([place]) 01/06/2022   STEMI involving right coronary artery ([place]) 01/17/2021   ST elevation myocardial infarction involving right coronary artery ([facility])    Right groin pain 09/07/2019   S/P right THA, AA 02/09/2018   S/P hip replacement 02/09/2018   Osteoarthritis of right hip joint due to dysplasia 01/04/2018   Lumbar stenosis with neurogenic claudication 11/11/2017   PROSTATE SPECIFIC ANTIGEN, ELEVATED 07/19/2009   Raised prostate specific antigen 07/19/2009   BACK PAIN 06/11/2007   Essential hypertension 05/21/2007   Osteoarthritis 05/21/2007      Review of Systems  All other systems reviewed and are negative.     Objective:     BP 112/68 (BP Location: Left Arm, Patient Position: Sitting, Cuff Size: Large)   Pulse 70  Temp 98.6 F (37 C) (Oral)   Ht '6\' 4"'$  (1.93 m)   Wt 208 lb 11.2 oz (94.7 kg)   SpO2 98%   BMI 25.40 kg/m  BP Readings from Last 3 Encounters:  07/09/22 112/68  01/08/22 130/78  01/06/22 108/60      Physical Exam Vitals reviewed.  Constitutional:      Appearance: Normal appearance. He is well-groomed and normal weight.  Eyes:     Conjunctiva/sclera: Conjunctivae normal.  Cardiovascular:     Rate and Rhythm: Normal rate and regular rhythm.     Heart sounds: S1 normal and S2 normal. No murmur heard. Pulmonary:     Effort: Pulmonary effort is normal.      Breath sounds: Normal breath sounds and air entry. No rales.  Musculoskeletal:     Right lower leg: No edema.     Left lower leg: No edema.  Neurological:     General: No focal deficit present.     Mental Status: He is alert and oriented to person, place, and time.     Gait: Gait is intact.  Psychiatric:        Mood and Affect: Mood and affect normal.        Behavior: Behavior normal.      No results found for any visits on 07/09/22.    The ASCVD Risk score (Arnett DK, et al., 2019) failed to calculate for the following reasons:   The patient has a prior MI or stroke diagnosis    Assessment & Plan:   Problem List Items Addressed This Visit       Unprioritized   Essential hypertension - Primary    Current hypertension medications:       Sig   losartan (COZAAR) 50 MG tablet (Taking) TAKE ONE TABLET EVERY MORNING AND TAKE 1/2 TABLET EVERY EVENING   metoprolol succinate (TOPROL-XL) 25 MG 24 hr tablet (Taking) TAKE ONE TABLET BY MOUTH DAILY      Blood pressure currently stable on the above medications. He is engaging in regular physical activity (swimming, golf, going to the gym) and watches his salt intake carefully at home. He is compliant with medications. Continue current medications. Follow up every 6 months.       Lumbar stenosis with neurogenic claudication    Patient has had multiple surgeries on his back, last one was in 2019. He is taking tramadol every 6 hours and gabapentin at bedtime. He reports the medication is helping with his pain level. Will continue to refill his muscle relaxer for him as well. He cannot take NSAIDS due to hx of CAD/ AMI.       Other Visit Diagnoses     Colon cancer screening       Relevant Orders   Ambulatory referral to Gastroenterology       Return in about 6 months (around 01/07/2023) for Annual physical Exam.    Farrel Conners, MD

## 2022-07-09 NOTE — Assessment & Plan Note (Signed)
Patient has had multiple surgeries on his back, last one was in 2019. He is taking tramadol every 6 hours and gabapentin at bedtime. He reports the medication is helping with his pain level. Will continue to refill his muscle relaxer for him as well. He cannot take NSAIDS due to hx of CAD/ AMI.

## 2022-07-09 NOTE — Assessment & Plan Note (Signed)
Current hypertension medications:       Sig   losartan (COZAAR) 50 MG tablet (Taking) TAKE ONE TABLET EVERY MORNING AND TAKE 1/2 TABLET EVERY EVENING   metoprolol succinate (TOPROL-XL) 25 MG 24 hr tablet (Taking) TAKE ONE TABLET BY MOUTH DAILY       Blood pressure currently stable on the above medications. He is engaging in regular physical activity (swimming, golf, going to the gym) and watches his salt intake carefully at home. He is compliant with medications. Continue current medications. Follow up every 6 months.

## 2022-07-28 ENCOUNTER — Telehealth: Payer: Self-pay | Admitting: Family Medicine

## 2022-07-28 NOTE — Telephone Encounter (Signed)
Sure that's fine with me!

## 2022-07-28 NOTE — Telephone Encounter (Signed)
Pt called to inform MD that his friend, who also sees Gertie Fey MD - Dr. Ardis Hughs, told him that Gertie Fey MD had a medical &/or personal event that may take him out of the office for a bit, and he may not be able to see Pt.   Pt would like to know if he can see Dr. Zenovia Jarred, if Dr. Ardis Hughs is not going to be able to see him??  Please advise.

## 2022-07-29 ENCOUNTER — Encounter: Payer: Self-pay | Admitting: Internal Medicine

## 2022-07-29 NOTE — Telephone Encounter (Signed)
What do I need to do for this?

## 2022-07-29 NOTE — Telephone Encounter (Signed)
Message sent to PCP in error.  Spoke with the patient and informed him of the message below.

## 2022-07-29 NOTE — Telephone Encounter (Signed)
Dear Dr. Legrand Como,  My apologies, I mistakenly sent this message directly to you, instead of your [facility].

## 2022-08-06 DIAGNOSIS — H43813 Vitreous degeneration, bilateral: Secondary | ICD-10-CM | POA: Diagnosis not present

## 2022-08-06 DIAGNOSIS — H02834 Dermatochalasis of left upper eyelid: Secondary | ICD-10-CM | POA: Diagnosis not present

## 2022-08-06 DIAGNOSIS — H02831 Dermatochalasis of right upper eyelid: Secondary | ICD-10-CM | POA: Diagnosis not present

## 2022-08-06 DIAGNOSIS — H40013 Open angle with borderline findings, low risk, bilateral: Secondary | ICD-10-CM | POA: Diagnosis not present

## 2022-08-06 DIAGNOSIS — H2513 Age-related nuclear cataract, bilateral: Secondary | ICD-10-CM | POA: Diagnosis not present

## 2022-08-06 DIAGNOSIS — D3132 Benign neoplasm of left choroid: Secondary | ICD-10-CM | POA: Diagnosis not present

## 2022-08-07 DIAGNOSIS — L820 Inflamed seborrheic keratosis: Secondary | ICD-10-CM | POA: Diagnosis not present

## 2022-08-07 DIAGNOSIS — D485 Neoplasm of uncertain behavior of skin: Secondary | ICD-10-CM | POA: Diagnosis not present

## 2022-08-07 DIAGNOSIS — L570 Actinic keratosis: Secondary | ICD-10-CM | POA: Diagnosis not present

## 2022-08-21 ENCOUNTER — Other Ambulatory Visit: Payer: Self-pay | Admitting: Family Medicine

## 2022-09-04 ENCOUNTER — Other Ambulatory Visit: Payer: Self-pay | Admitting: *Deleted

## 2022-09-04 DIAGNOSIS — M48062 Spinal stenosis, lumbar region with neurogenic claudication: Secondary | ICD-10-CM

## 2022-09-04 MED ORDER — TIZANIDINE HCL 4 MG PO TABS: 4.0000 mg | ORAL_TABLET | Freq: Three times a day (TID) | ORAL | 0 refills | Status: DC | PRN

## 2022-09-05 ENCOUNTER — Telehealth: Payer: Self-pay

## 2022-09-05 NOTE — Progress Notes (Signed)
Care Management & Coordination Services Pharmacy Team  Reason for Encounter: Appointment Reminder  Contacted patient to confirm telephone appointment with Burman Riis, PharmD on 09/08/2022 at 4:00. Spoke with patient on 09/05/2022   Do you have any problems getting your medications? Patient denies  What is your top health concern you would like to discuss at your upcoming visit? Patient denies  Have you seen any other providers since your last visit with PCP? Patient denies  Care Gaps: AWV - completed 03/17/2022 Last BP - 112/68 on 07/09/2022 Covid - overdue Hep C screen - postponed   Star Rating Drugs: Rosuvastatin 40 mg - last filled 07/17/2022 90 DS at [place] Losartan 50 mg - last filled 09/04/2022 100 DS at [place] Pharmacist Assistant [phone]

## 2022-09-06 NOTE — Progress Notes (Unsigned)
Care Management & Coordination Services Pharmacy Note  09/06/2022 Name:  Bryan [name] MRN:  [id number] DOB:  10-18-1945  Summary: BP at goal <130/80 Denies any chest pain or signs of bleed  Recommendations/Changes made from today's visit: -Check BP at home once weekly and keep a log, continue to be mindful of salt intake -Counseled to watch for signs of bleed and stroke/MI  Follow up plan: Pharmacist visit in 8 months   Subjective: Bryan [name] is an 77 y.o. year old male who is a primary patient of Farrel Conners, MD.  The care coordination team was consulted for assistance with disease management and care coordination needs.    Engaged with patient by telephone for follow up visit.  Recent office visits: 07/09/22 Loralyn Freshwater, MD - For HTN, no med changes  Recent consult visits: 05/01/22 Danella Sensing (Dermatology) - For foreign body granuloma of the skin, no med changes  Hospital visits: None in previous 6 months   Objective:  Lab Results  Component Value Date   CREATININE 1.19 01/09/2022   BUN 19 01/09/2022   GFR 67.55 06/21/2021   EGFR 64 01/09/2022   GFRNONAA >60 01/18/2021   GFRAA >60 02/10/2018   NA 141 01/09/2022   K 4.7 01/09/2022   CALCIUM 9.9 01/09/2022   CO2 24 01/09/2022   GLUCOSE 95 01/09/2022    Lab Results  Component Value Date/Time   HGBA1C 5.5 01/17/2021 12:08 PM   HGBA1C 5.4 03/05/2020 08:39 AM   GFR 67.55 06/21/2021 09:26 AM   GFR 52.44 (L) 12/19/2020 09:50 AM    Last diabetic Eye exam: No results found for: "HMDIABEYEEXA"  Last diabetic Foot exam: No results found for: "HMDIABFOOTEX"   Lab Results  Component Value Date   CHOL 85 (L) 01/09/2022   HDL 47 01/09/2022   LDLCALC 20 01/09/2022   LDLDIRECT 128.0 03/02/2019   TRIG 89 01/09/2022   CHOLHDL 1.8 01/09/2022       Latest Ref Rng & Units 01/09/2022    9:40 AM 06/21/2021    9:26 AM 01/17/2021   12:08 PM  Hepatic Function  Total Protein 6.0 - 8.5 g/dL 6.4  7.0  6.3    Albumin 3.8 - 4.8 g/dL 4.3  4.4  4.0   AST 0 - 40 IU/L 28  26  28    ALT 0 - 44 IU/L 24  21  24    Alk Phosphatase 44 - 121 IU/L 54  49  38   Total Bilirubin 0.0 - 1.2 mg/dL 1.7  1.5  1.6     Lab Results  Component Value Date/Time   TSH 2.700 01/09/2022 09:40 AM   TSH 2.17 03/02/2019 08:52 AM       Latest Ref Rng & Units 01/09/2022    9:40 AM 06/21/2021    9:26 AM 01/18/2021    1:25 AM  CBC  WBC 3.4 - 10.8 x10E3/uL 5.6  6.5  9.5   Hemoglobin 13.0 - 17.7 g/dL 17.3  16.5  16.6   Hematocrit 37.5 - 51.0 % 50.8  50.6  50.3   Platelets 150 - 450 x10E3/uL 194  221.0  187     Lab Results  Component Value Date/Time   VD25OH 39.93 06/21/2021 09:26 AM   VD25OH 44.89 10/12/2019 09:11 AM   VITAMINB12 361 06/21/2021 09:26 AM   VITAMINB12 267 03/02/2019 08:52 AM    Clinical ASCVD: Yes  The ASCVD Risk score (Arnett DK, et al., 2019) failed to calculate for the following reasons:  The patient has a prior MI or stroke diagnosis       03/17/2022    4:19 PM 01/06/2022    7:58 AM 06/21/2021    8:16 AM  Depression screen PHQ 2/9  Decreased Interest 0 0 0  Down, Depressed, Hopeless 0 1 0  PHQ - 2 Score 0 1 0  Altered sleeping  0 0  Tired, decreased energy  0 0  Change in appetite  0 0  Feeling bad or failure about yourself   0 0  Trouble concentrating  0 0  Moving slowly or fidgety/restless  0 0  Suicidal thoughts  0 0  PHQ-9 Score  1 0     Social History   Tobacco Use  Smoking Status Never  Smokeless Tobacco Never   BP Readings from Last 3 Encounters:  07/09/22 112/68  01/08/22 130/78  01/06/22 108/60   Pulse Readings from Last 3 Encounters:  07/09/22 70  01/08/22 63  01/06/22 67   Wt Readings from Last 3 Encounters:  07/09/22 208 lb 11.2 oz (94.7 kg)  03/17/22 210 lb (95.3 kg)  01/08/22 210 lb (95.3 kg)   BMI Readings from Last 3 Encounters:  07/09/22 25.40 kg/m  03/17/22 25.56 kg/m  01/08/22 25.56 kg/m    Allergies  Allergen Reactions   Codeine Other (See  Comments)    "made me sick"    Medications Reviewed Today     Reviewed by Farrel Conners, MD (Physician) on 07/09/22 at [place] List Status: <None>   Medication Order Taking? Sig Documenting Provider Last Dose Status Informant  acetaminophen (TYLENOL) 500 MG tablet [id number] Yes Take 500 mg by mouth daily as needed for moderate pain. [provider] Taking Active Self  aspirin 81 MG chewable tablet [id number] Yes Chew 1 tablet (81 mg total) by mouth daily. Barrett, Felisa Bonier Taking Active Self  BRILINTA 90 MG TABS tablet [id number] Yes TAKE ONE TABLET BY MOUTH TWICE DAILY Barrett, Evelene Croon, PA-C Taking Active   calcium carbonate (TUMS - DOSED IN MG ELEMENTAL CALCIUM) 500 MG chewable tablet [id number] Yes Chew 2 tablets by mouth daily as needed for indigestion or heartburn. [provider] Taking Active Self  Cholecalciferol (VITAMIN D3) 2000 units TABS [id number] Yes Take 2,000 Units by mouth daily. [provider] Taking Active Self  gabapentin (NEURONTIN) 100 MG capsule [id number] Yes TAKE TWO CAPSULES BY MOUTH AT BEDTIME Farrel Conners, MD Taking Active   losartan (COZAAR) 50 MG tablet [id number] Yes TAKE ONE TABLET EVERY MORNING AND TAKE 1/2 TABLET EVERY EVENING Troy Sine, MD Taking Active   MAGNESIUM CITRATE PO [id number] Yes Take by mouth daily. [provider] Taking Active   Menthol, Topical Analgesic, (BIOFREEZE) 4 % [id number] Yes Apply 1 application topically daily as needed (pain). [provider] Taking Active Self  metoprolol succinate (TOPROL-XL) 25 MG 24 hr tablet [id number] Yes TAKE ONE TABLET BY MOUTH DAILY Barrett, Evelene Croon, PA-C Taking Active   Multiple Vitamins-Minerals (HAIR SKIN AND NAILS FORMULA PO) [id number] Yes Take 1 tablet by mouth daily. [provider] Taking Active Self  nitroGLYCERIN (NITROSTAT) 0.4 MG SL tablet [id number] Yes Place 1 tablet (0.4 mg total) under the tongue every 5 (five) minutes  as needed for chest pain. Barrett, Evelene Croon, PA-C Taking Active Self  Omega-3 Fatty Acids (FISH OIL) 1200 MG CAPS [id number] Yes Take 1 capsule (1,200 mg total) by mouth daily. Barrett, Evelene Croon, PA-C Taking Active Self  rosuvastatin (CRESTOR)  40 MG tablet [id number] Yes TAKE ONE TABLET BY MOUTH DAILY Barrett, Evelene Croon, PA-C Taking Active   tiZANidine (ZANAFLEX) 4 MG tablet [id number] Yes Take 1 tablet (4 mg total) by mouth every 8 (eight) hours as needed for muscle spasms. Caren Macadam, MD Taking Active Self  traMADol (ULTRAM) 50 MG tablet [id number] Yes TAKE ONE TABLET BY MOUTH EVERY 6 HOURS AS NEEDED FOR moderate pain Farrel Conners, MD Taking Active   vitamin B-12 (CYANOCOBALAMIN) 1000 MCG tablet [id number] Yes Take 1,000 mcg by mouth daily. [provider] Taking Active Self  vitamin C (ASCORBIC ACID) 250 MG tablet [id number] Yes Take 250 mg by mouth daily. [provider] Taking Active Self            SDOH:  (Social Determinants of Health) assessments and interventions performed: Yes SDOH Interventions    Flowsheet Row Clinical Support from 03/17/2022 in [facility] from 09/11/2020 in [place] [facility] from 01/09/2020 in [facility] Interventions Intervention Not Indicated -- Intervention Not Indicated  Housing Interventions -- -- Intervention Not Indicated  Transportation Interventions Intervention Not Indicated Intervention Not Indicated Intervention Not Indicated  Depression Interventions/Treatment  -- -- PHQ2-9 Score <4 Follow-up Not Indicated  Financial Strain Interventions Intervention Not Indicated Intervention Not Indicated Intervention Not Indicated  Physical Activity Interventions Intervention Not Indicated -- Intervention Not Indicated  Stress Interventions Intervention Not Indicated  -- Intervention Not Indicated  Social Connections Interventions -- -- Intervention Not Indicated       Medication Assistance: None required.  Patient affirms current coverage meets needs.  Medication Access: Within the past 30 days, how often has patient missed a dose of medication? None Is a pillbox or other method used to improve adherence? Yes  Factors that may affect medication adherence? no barriers identified Are meds synced by current pharmacy? No  Are meds delivered by current pharmacy? No  Does patient experience delays in picking up medications due to transportation concerns? Yes   [facility] Reviewed: Is patient disadvantaged to use [facility]?: No  Current Rx insurance plan: [facility] Name and location of Current pharmacy:  [place], [place] [address] Phone: [phone] Fax: [phone]  [facility] services reviewed with patient today?: No  Patient requests to transfer care to [facility]?: No  Reason patient declined to change pharmacies: Not mentioned at this visit  Compliance/Adherence/Medication fill history: Care Gaps: AWV - completed 03/17/2022 Last BP - 112/68 on 07/09/2022 Covid - overdue Hep C screen - postpone  [facility] Drugs: Rosuvastatin 40 mg - last filled 07/17/2022 90 DS at [facility] Losartan 50 mg - last filled 09/04/2022 100 DS at [facility]   Assessment/Plan   Hypertension (BP goal <130/80) -Controlled -Current treatment: Losartan 50mg  1 qd Appropriate, Effective, Safe, Accessible Metoprolol XL 25mg  1 qd Appropriate, Effective, Safe, Accessible -Medications previously tried: Benazepril, HCTZ, Lisinopril  -Current home readings: 112/66 at dentist today -Current dietary habits: adds no salt to food, eats a salad daily and lots of veggies with other meals -Current exercise habits: Goes to gym daily and swims -Denies  hypotensive/hypertensive symptoms -Educated on BP goals and benefits of medications for prevention of heart attack, stroke and kidney damage; Daily salt intake goal < 2300 mg; Importance of home blood pressure  monitoring; -Counseled to monitor BP at home weekly, document, and provide log at future appointments -Recommended to continue current medication  CAD (Goal: Slow progression of atherosclerosis (plaques / blockages) throughout your body to reduce risk of heart attack and strokes) [place] controlled/uncontrolled: Controlled Current Medication Therapy:  Aspirin 81mg  1 qd Appropriate, Effective, Safe, Accessible  Brilinta 90mg  1 BID Appropriate, Effective, Safe, Accessible  Nitroglycerin 0.4mg  prn Appropriate, Effective, Safe, Accessible Medications Previously Tried: None Chest Pain in last 3 months: No Any signs of bleed? No Counseled on:   Continue medication therapy  Notify office of signs of bleed  [place] Pharmacist [phone]

## 2022-09-08 ENCOUNTER — Ambulatory Visit: Payer: Medicare Other

## 2022-09-18 ENCOUNTER — Telehealth: Payer: Self-pay | Admitting: *Deleted

## 2022-09-18 NOTE — Telephone Encounter (Signed)
Attempted to reach pt to verify if on blood thinner then schedule OV if is. Unable to reach pt. LM with call back #.

## 2022-09-18 NOTE — Telephone Encounter (Signed)
Attempted to reach pt for clarification if on blood thinners

## 2022-09-18 NOTE — Telephone Encounter (Signed)
Pt returned call and OV with PA set up for 11/14/22 @ 9:30 am asking him to come 15v min earlier. Facility # given to pt. All previous procedures and pre-visit to be canceled.

## 2022-09-25 DIAGNOSIS — M79641 Pain in right hand: Secondary | ICD-10-CM | POA: Diagnosis not present

## 2022-09-29 DIAGNOSIS — S62111A Displaced fracture of triquetrum [cuneiform] bone, right wrist, initial encounter for closed fracture: Secondary | ICD-10-CM | POA: Diagnosis not present

## 2022-10-16 ENCOUNTER — Encounter: Payer: Medicare Other | Admitting: Internal Medicine

## 2022-10-21 ENCOUNTER — Encounter: Payer: Self-pay | Admitting: Cardiovascular Disease

## 2022-10-21 ENCOUNTER — Ambulatory Visit: Payer: Medicare Other | Attending: Cardiovascular Disease | Admitting: Cardiovascular Disease

## 2022-10-21 DIAGNOSIS — I2111 ST elevation (STEMI) myocardial infarction involving right coronary artery: Secondary | ICD-10-CM

## 2022-10-21 DIAGNOSIS — I255 Ischemic cardiomyopathy: Secondary | ICD-10-CM | POA: Diagnosis not present

## 2022-10-21 DIAGNOSIS — Z8719 Personal history of other diseases of the digestive system: Secondary | ICD-10-CM | POA: Diagnosis not present

## 2022-10-21 DIAGNOSIS — I7121 Aneurysm of the ascending aorta, without rupture: Secondary | ICD-10-CM

## 2022-10-21 DIAGNOSIS — I10 Essential (primary) hypertension: Secondary | ICD-10-CM | POA: Diagnosis not present

## 2022-10-21 DIAGNOSIS — E785 Hyperlipidemia, unspecified: Secondary | ICD-10-CM

## 2022-10-21 DIAGNOSIS — F419 Anxiety disorder, unspecified: Secondary | ICD-10-CM
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
## 2022-10-21 NOTE — Patient Instructions (Addendum)
Medication Instructions:  The current medical regimen is effective;  continue present plan and medications.  *If you need a refill on your cardiac medications before your next appointment, please call your pharmacy*  Testing: Echocardiogram (6 months) - Your physician has requested that you have an echocardiogram. Echocardiography is a painless test that uses sound waves to create images of your heart. It provides your doctor with information about the size and shape of your heart and how well your heart's chambers and valves are working. This procedure takes approximately one hour. There are no restrictions for this procedure.    Follow-Up: At [facility], you and your health needs are our priority.  As part of our continuing mission to provide you with exceptional heart care, we have created designated Provider Care Teams.  These Care Teams include your primary Cardiologist (physician) and Advanced Practice Providers (APPs -  Physician Assistants and Nurse Practitioners) who all work together to provide you with the care you need, when you need it.  We recommend signing up for the patient portal called "MyChart".  Sign up information is provided on this After Visit Summary.  MyChart is used to connect with patients for Virtual Visits (Telemedicine).  Patients are able to view lab/test results, encounter notes, upcoming appointments, etc.  Non-urgent messages can be sent to your provider as well.   To learn more about what you can do with MyChart, go to [email or web address].    Your next appointment:   6 month(s)  Provider:   Nicki Guadalajara, MD    Other Instructions Referral to VVS- Dr.Bartle- they will be in contact with you.

## 2022-10-23 DIAGNOSIS — L570 Actinic keratosis: Secondary | ICD-10-CM | POA: Diagnosis not present

## 2022-10-23 DIAGNOSIS — C44329 Squamous cell carcinoma of skin of other parts of face: Secondary | ICD-10-CM | POA: Diagnosis not present

## 2022-10-26 ENCOUNTER — Other Ambulatory Visit: Payer: Self-pay | Admitting: Family Medicine

## 2022-10-26 DIAGNOSIS — M48062 Spinal stenosis, lumbar region with neurogenic claudication: Secondary | ICD-10-CM

## 2022-10-29 ENCOUNTER — Other Ambulatory Visit: Payer: Self-pay

## 2022-10-29 MED ORDER — TICAGRELOR 60 MG PO TABS: 60.0000 mg | ORAL_TABLET | Freq: Two times a day (BID) | ORAL | 1 refills | Status: DC

## 2022-11-14 ENCOUNTER — Encounter: Payer: Self-pay | Admitting: Physician Assistant

## 2022-11-14 ENCOUNTER — Ambulatory Visit: Payer: Medicare Other | Admitting: Physician Assistant

## 2022-11-14 VITALS — BP 124/70 | HR 72 | Ht 73.5 in | Wt 210.0 lb

## 2022-11-14 DIAGNOSIS — Z7902 Long term (current) use of antithrombotics/antiplatelets: Secondary | ICD-10-CM | POA: Diagnosis not present

## 2022-11-14 DIAGNOSIS — Z7901 Long term (current) use of anticoagulants: Secondary | ICD-10-CM | POA: Diagnosis not present

## 2022-11-14 DIAGNOSIS — Z955 Presence of coronary angioplasty implant and graft: Secondary | ICD-10-CM

## 2022-11-14 DIAGNOSIS — I2510 Atherosclerotic heart disease of native coronary artery without angina pectoris: Secondary | ICD-10-CM

## 2022-11-14 DIAGNOSIS — Z1211 Encounter for screening for malignant neoplasm of colon: Secondary | ICD-10-CM | POA: Diagnosis not present

## 2022-11-14 MED ORDER — NA SULFATE-K SULFATE-MG SULF 17.5-3.13-1.6 GM/177ML PO SOLN: 1.0000 | Freq: Once | ORAL | 0 refills | Status: AC

## 2022-11-14 NOTE — Progress Notes (Signed)
Chief Complaint: Screening for colorectal cancer in a patient on chronic anticoagulation with Brilinta  HPI:    Bryan [name] is a 77 year old male, previously known to Dr. Christella Hartigan, with a past medical history as listed below including anxiety, depression, GERD and multiple others, status post stenting in 2022 on Brilinta, who was referred to me by Karie Georges, MD for sideration of a screening colonoscopy.    10/25/2012 colonoscopy with a few small diverticula and otherwise normal.  Repeat recommended in 10 years.   03/26/2021 echo with mild LV dysfunction and EF of 45 to 50% with hypokinesis of the inferior and posterior wall.  Also aneurysmal dilation of the ascending aorta.    06/19/2022 CT angio chest showed aortic root dilation at 4.7 cm slightly enlarged compared to prior exam.    10/21/2022 patient seen by Dr. Tresa Endo of cardiology and cleared to hold his Brilinta for 5 days prior to time of colonoscopy.    Today, the patient presents to clinic and tells me he is not having any GI issues or concerns.  He is aware that he needs another colonoscopy.  Tells me that baseline he is an anxious person so he has his worries about this but keeps telling himself that it is a routine procedure.  He already saw his cardiologist to provided anticoagulation clearance at that visit for his Brilinta.    Denies fever, chills, weight loss, blood in his stool, nausea, vomiting, abdominal pain or change in bowel habits.  Past Medical History:  Diagnosis Date   Anxiety    Cancer (HCC)    facial skin cancer   Depression    GERD (gastroesophageal reflux disease)    takes tums rarely   Hematuria    1 time in july 2019   Hypertension    Osteoarthritis    Spinal stenosis    ST elevation myocardial infarction involving right coronary artery [facility])     Past Surgical History:  Procedure Laterality Date   ACHILLES TENDON SURGERY  02/2006   ANTERIOR LAT LUMBAR FUSION N/A 11/11/2017   Procedure: LUMBAR TWO-  LUMBAR THREE, LUMBAR THREE- LUMBAR FOUR, LUMBAR FOUR- LUMBAR FIVE ANTERIOLATERAL INTERBODY ARTHRODESIS;  Surgeon: Shirlean Kelly, MD;  Location: [facility] OR;  Service: Neurosurgery;  Laterality: N/A;   COLONOSCOPY  2003   COLONOSCOPY     CORONARY STENT INTERVENTION N/A 01/17/2021   Procedure: CORONARY STENT INTERVENTION;  Surgeon: Lennette Bihari, MD;  Location: [facility] INVASIVE CV LAB;  Service: Cardiovascular;  Laterality: N/A;   CORONARY/GRAFT ACUTE MI REVASCULARIZATION N/A 01/17/2021   Procedure: Coronary/Graft Acute MI Revascularization;  Surgeon: Lennette Bihari, MD;  Location[facility] INVASIVE CV LAB;  Service: Cardiovascular;  Laterality: N/A;   CYSTOSCOPY N/A 05/21/2018   Procedure: CYSTOSCOPY FLEXIBLE;  Surgeon: Jerilee Field, MD;  Location: [facility];  Service: Urology;  Laterality: N/A;   KNEE SURGERY Left 1991 and 2001   X2 arthroscopy   LEFT HEART CATH AND CORONARY ANGIOGRAPHY N/A 01/17/2021   Procedure: LEFT HEART CATH AND CORONARY ANGIOGRAPHY;  Surgeon: Lennette Bihari, MD;  Location: [facility] INVASIVE CV LAB;  Service: Cardiovascular;  Laterality: N/A;   LUMBAR PERCUTANEOUS PEDICLE SCREW 3 LEVEL N/A 11/11/2017   Procedure: LUMBAR PERCUTANEOUS PEDICLE SCREW PLACEMENT LUMBAR TWO-THREE, LUMBER THREE-FOUR, LUMBAR FOUR-FIVE;  Surgeon: Shirlean Kelly, MD;  Location: [facility] OR;  Service: Neurosurgery;  Laterality: N/A;   SPINE SURGERY  12-08, 5-10   TOTAL HIP ARTHROPLASTY     oct 2006,   TOTAL HIP  ARTHROPLASTY Right 02/09/2018   Procedure: RIGHT TOTAL HIP ARTHROPLASTY ANTERIOR APPROACH;  Surgeon: Durene Romans, MD;  Location: [facility] ORS;  Service: Orthopedics;  Laterality: Right;    Current Outpatient Medications  Medication Sig Dispense Refill   acetaminophen (TYLENOL) 500 MG tablet Take 500 mg by mouth daily as needed for moderate pain.     aspirin 81 MG chewable tablet Chew 1 tablet (81 mg total) by mouth daily.     calcium carbonate (TUMS - DOSED IN MG ELEMENTAL CALCIUM) 500 MG chewable  tablet Chew 2 tablets by mouth daily as needed for indigestion or heartburn.     Cholecalciferol (VITAMIN D3) 2000 units TABS Take 2,000 Units by mouth daily.     gabapentin (NEURONTIN) 100 MG capsule TAKE TWO CAPSULES BY MOUTH AT BEDTIME 180 capsule 0   losartan (COZAAR) 50 MG tablet TAKE ONE TABLET EVERY MORNING AND TAKE 1/2 TABLET EVERY EVENING 270 tablet 3   MAGNESIUM CITRATE PO Take by mouth daily.     Menthol, Topical Analgesic, (BIOFREEZE) 4 % GEL Apply 1 application topically daily as needed (pain).     metoprolol succinate (TOPROL-XL) 25 MG 24 hr tablet TAKE ONE TABLET BY MOUTH DAILY 90 tablet 3   Multiple Vitamins-Minerals (HAIR SKIN AND NAILS FORMULA PO) Take 1 tablet by mouth daily.     nitroGLYCERIN (NITROSTAT) 0.4 MG SL tablet Place 1 tablet (0.4 mg total) under the tongue every 5 (five) minutes as needed for chest pain. 25 tablet 3   Omega-3 Fatty Acids (FISH OIL) 1200 MG CAPS Take 1 capsule (1,200 mg total) by mouth daily. 30 capsule 1   rosuvastatin (CRESTOR) 40 MG tablet TAKE ONE TABLET BY MOUTH DAILY 90 tablet 3   ticagrelor (BRILINTA) 60 MG TABS tablet Take 1 tablet (60 mg total) by mouth 2 (two) times daily. 180 tablet 1   tiZANidine (ZANAFLEX) 4 MG tablet Take 1 tablet (4 mg total) by mouth every 8 (eight) hours as needed for muscle spasms. 90 tablet 0   traMADol (ULTRAM) 50 MG tablet TAKE ONE TABLET BY MOUTH EVERY 6 HOURS AS NEEDED FOR moderate pain 120 tablet 2   vitamin B-12 (CYANOCOBALAMIN) 1000 MCG tablet Take 1,000 mcg by mouth daily.     vitamin C (ASCORBIC ACID) 250 MG tablet Take 250 mg by mouth daily.     No current facility-administered medications for this visit.    Allergies as of 11/14/2022 - Review Complete 11/14/2022  Allergen Reaction Noted   Codeine Other (See Comments) 09/30/2012    Family History  Problem Relation Age of Onset   Diabetes Mother    Coronary artery disease Mother 52   Hypertension Mother    Other Father 45   Colon polyps Neg Hx     Esophageal cancer Neg Hx    Rectal cancer Neg Hx    Stomach cancer Neg Hx     Social History   Socioeconomic History   Marital status: Married    Spouse name: Not on file   Number of children: 0   Years of education: Not on file   Highest education level: Not on file  Occupational History   Occupation: retired  Tobacco Use   Smoking status: Never   Smokeless tobacco: Never  Vaping Use   Vaping Use: Never used  Substance and Sexual Activity   Alcohol use: No   Drug use: No   Sexual activity: Not on file  Other Topics Concern   Not on file  Social History  Narrative   Divorced, Exercises regularly   Social Determinants of Health   Financial Resource Strain: Low Risk  (03/17/2022)   Overall Financial Resource Strain (CARDIA)    Difficulty of Paying Living Expenses: Not hard at all  Food Insecurity: No Food Insecurity (09/08/2022)   Hunger Vital Sign    Worried About Running Out of Food in the Last Year: Never true    Ran Out of Food in the Last Year: Never true  Transportation Needs: No Transportation Needs (03/17/2022)   PRAPARE - Administrator, Civil Service (Medical): No    Lack of Transportation (Non-Medical): No  Physical Activity: Sufficiently Active (03/17/2022)   Exercise Vital Sign    Days of Exercise per Week: 5 days    Minutes of Exercise per Session: 30 min  Stress: No Stress Concern Present (03/17/2022)   [employer] of Occupational Health - Occupational Stress Questionnaire    Feeling of Stress : Not at all  Social Connections: Moderately Isolated (03/13/2021)   Social Connection and Isolation Panel [NHANES]    Frequency of Communication with Friends and Family: More than three times a week    Frequency of Social Gatherings with Friends and Family: More than three times a week    Attends Religious Services: Never    Database administrator or Organizations: No    Attends Banker Meetings: Never    Marital Status: Married   Catering manager Violence: Not At Risk (03/13/2021)   Humiliation, Afraid, Rape, and Kick questionnaire    Fear of Current or Ex-Partner: No    Emotionally Abused: No    Physically Abused: No    Sexually Abused: No    Review of Systems:    Constitutional: No weight loss, fever or chills Skin: No rash  Cardiovascular: No chest pain   Respiratory: No SOB Gastrointestinal: See HPI and otherwise negative Genitourinary: No dysuria  Neurological: No headache, dizziness or syncope Musculoskeletal: No new muscle or joint pain Hematologic: No bleeding  Psychiatric: No history of depression or anxiety   Physical Exam:  Vital signs: BP 124/70 (BP Location: Left Arm, Patient Position: Sitting, Cuff Size: Normal)   Pulse 72   Ht 6' 1.5" (1.867 m)   Wt 210 lb (95.3 kg)   BMI 27.33 kg/m    Constitutional:   Pleasant Caucasian male appears to be in NAD, Well developed, Well nourished, alert and cooperative Head:  Normocephalic and atraumatic. Eyes:   PEERL, EOMI. No icterus. Conjunctiva pink. Ears:  Normal auditory acuity. Neck:  Supple Throat: Oral cavity and pharynx without inflammation, swelling or lesion.  Respiratory: Respirations even and unlabored. Lungs clear to auscultation bilaterally.   No wheezes, crackles, or rhonchi.  Cardiovascular: Normal S1, S2. No MRG. Regular rate and rhythm. No peripheral edema, cyanosis or pallor.  Gastrointestinal:  Soft, nondistended, nontender. No rebound or guarding. Normal bowel sounds. No appreciable masses or hepatomegaly. Rectal:  Not performed.  Msk:  Symmetrical without gross deformities. Without edema, no deformity or joint abnormality.  Neurologic:  Alert and  oriented x4;  grossly normal neurologically.  Skin:   Dry and intact without significant lesions or rashes. Psychiatric:  Demonstrates good judgement and reason without abnormal affect or behaviors.  RELEVANT LABS AND IMAGING: CBC    Component Value Date/Time   WBC 5.6  01/09/2022 0940   WBC 6.5 06/21/2021 0926   RBC 5.55 01/09/2022 0940   RBC 5.57 06/21/2021 0926   HGB 17.3 01/09/2022 0940  HCT 50.8 01/09/2022 0940   PLT 194 01/09/2022 0940   MCV 92 01/09/2022 0940   MCH 31.2 01/09/2022 0940   MCH 31.3 01/18/2021 0125   MCHC 34.1 01/09/2022 0940   MCHC 32.7 06/21/2021 0926   RDW 12.9 01/09/2022 0940   LYMPHSABS 1.0 06/21/2021 0926   MONOABS 0.5 06/21/2021 0926   EOSABS 0.2 06/21/2021 0926   BASOSABS 0.0 06/21/2021 0926    CMP     Component Value Date/Time   NA 141 01/09/2022 0940   K 4.7 01/09/2022 0940   CL 104 01/09/2022 0940   CO2 24 01/09/2022 0940   GLUCOSE 95 01/09/2022 0940   GLUCOSE 73 06/21/2021 0926   GLUCOSE 98 05/26/2006 0848   BUN 19 01/09/2022 0940   CREATININE 1.19 01/09/2022 0940   CREATININE 1.13 03/05/2020 0839   CALCIUM 9.9 01/09/2022 0940   PROT 6.4 01/09/2022 0940   ALBUMIN 4.3 01/09/2022 0940   AST 28 01/09/2022 0940   ALT 24 01/09/2022 0940   ALKPHOS 54 01/09/2022 0940   BILITOT 1.7 (H) 01/09/2022 0940   GFRNONAA >60 01/18/2021 0125   GFRAA >60 02/10/2018 0453    Assessment: 1.  Screening for colorectal cancer: It has been 10 years since patient's last colonoscopy, he is due now 2.  History of CAD status post stent in 2022 on Brilinta  Plan: 1.  Patient was scheduled for a colonoscopy with Dr. Rhea Belton per his request.  Did provide the patient a detailed list of risks for the procedure and he agrees to proceed. Patient is appropriate for endoscopic procedure(s) in the ambulatory ([facility]) setting.  2.  Patient was advised he will need to hold his Brilinta for 5 days prior to time of procedure.  We have already received clearance for this by his cardiologist. 3.  Patient to follow in clinic per recommendations from Dr. Rhea Belton after time of procedure.  Hyacinth Meeker, PA-C [facility] 11/14/2022, 9:02 AM  Cc: Karie Georges, MD

## 2022-11-14 NOTE — Patient Instructions (Signed)
You have been scheduled for a colonoscopy. Please follow written instructions given to you at your visit today.  Please pick up your prep supplies at the pharmacy within the next 1-3 days. If you use inhalers (even only as needed), please bring them with you on the day of your procedure.  _______________________________________________________  If your blood pressure at your visit was 140/90 or greater, please contact your primary care physician to follow up on this.  _______________________________________________________  If you are age 80 or older, your body mass index should be between 23-30. Your Body mass index is 27.33 kg/m. If this is out of the aforementioned range listed, please consider follow up with your Primary Care Provider.  If you are age 73 or younger, your body mass index should be between 19-25. Your Body mass index is 27.33 kg/m. If this is out of the aformentioned range listed, please consider follow up with your Primary Care Provider.   ________________________________________________________  The [facility] providers would like to encourage you to use [facility] to communicate with providers for non-urgent requests or questions.  Due to long hold times on the telephone, sending your provider a message by [facility] may be a faster and more efficient way to get a response.  Please allow 48 business hours for a response.  Please remember that this is for non-urgent requests.  _______________________________________________________

## 2022-11-17 NOTE — Progress Notes (Signed)
Addendum: Reviewed and agree with assessment and management plan. Emmalise Huard M, MD  

## 2022-11-19 ENCOUNTER — Other Ambulatory Visit: Payer: Self-pay | Admitting: Family Medicine

## 2022-11-26 ENCOUNTER — Institutional Professional Consult (permissible substitution): Payer: Medicare Other | Admitting: Surgery

## 2022-11-26 ENCOUNTER — Encounter: Payer: Self-pay | Admitting: Surgery

## 2022-11-26 VITALS — BP 129/83 | HR 67 | Resp 20 | Ht 75.0 in | Wt 210.0 lb

## 2022-11-26 DIAGNOSIS — I7121 Aneurysm of the ascending aorta, without rupture: Secondary | ICD-10-CM

## 2022-11-26 NOTE — Progress Notes (Signed)
Cardiothoracic Surgery Consultation  PCP is Karie Georges, MD Referring Provider is Lennette Bihari, MD  Chief Complaint  Patient presents with   Thoracic Aortic Aneurysm    Surgical consult    HPI:  The patient is a 77 year old gentleman with a history of hypertension, hyperlipidemia, coronary artery disease status post inferior STEMI in August 2022 and PCI.  He had a CTA of the chest in 05/2021 showing mild dilation at the sinus of Valsalva level measuring 4.5 cm.  The remainder of the thoracic aorta was within normal limits.  He had a follow-up CTA of the chest in January 2024 which showed the aortic root to measure 4.7 cm.  He had a stable 7 mm right lower lobe nodule.  He denies any family history of connective tissue disorder, aortic aneurysm, or aortic dissection.  His mother had coronary disease and I did coronary bypass surgery on her around 88.   Past Medical History:  Diagnosis Date   Anxiety    Cancer (HCC)    facial skin cancer   Depression    GERD (gastroesophageal reflux disease)    takes tums rarely   Hematuria    1 time in july 2019   Hypertension    Osteoarthritis    Spinal stenosis    ST elevation myocardial infarction involving right coronary artery [facility])     Past Surgical History:  Procedure Laterality Date   ACHILLES TENDON SURGERY  02/2006   ANTERIOR LAT LUMBAR FUSION N/A 11/11/2017   Procedure: LUMBAR TWO- LUMBAR THREE, LUMBAR THREE- LUMBAR FOUR, LUMBAR FOUR- LUMBAR FIVE ANTERIOLATERAL INTERBODY ARTHRODESIS;  Surgeon: Shirlean Kelly, MD;  Location: [facility] OR;  Service: Neurosurgery;  Laterality: N/A;   COLONOSCOPY  2003   COLONOSCOPY     CORONARY STENT INTERVENTION N/A 01/17/2021   Procedure: CORONARY STENT INTERVENTION;  Surgeon: Lennette Bihari, MD;  Location: [facility] INVASIVE CV LAB;  Service: Cardiovascular;  Laterality: N/A;   CORONARY/GRAFT ACUTE MI REVASCULARIZATION N/A 01/17/2021   Procedure: Coronary/Graft Acute MI Revascularization;  Surgeon:  Lennette Bihari, MD;  Location: [facility] INVASIVE CV LAB;  Service: Cardiovascular;  Laterality: N/A;   CYSTOSCOPY N/A 05/21/2018   Procedure: CYSTOSCOPY FLEXIBLE;  Surgeon: Jerilee Field, MD;  Location: [facility];  Service: Urology;  Laterality: N/A;   KNEE SURGERY Left 1991 and 2001   X2 arthroscopy   LEFT HEART CATH AND CORONARY ANGIOGRAPHY N/A 01/17/2021   Procedure: LEFT HEART CATH AND CORONARY ANGIOGRAPHY;  Surgeon: Lennette Bihari, MD;  Location: [facility] INVASIVE CV LAB;  Service: Cardiovascular;  Laterality: N/A;   LUMBAR PERCUTANEOUS PEDICLE SCREW 3 LEVEL N/A 11/11/2017   Procedure: LUMBAR PERCUTANEOUS PEDICLE SCREW PLACEMENT LUMBAR TWO-THREE, LUMBER THREE-FOUR, LUMBAR FOUR-FIVE;  Surgeon: Shirlean Kelly, MD;  Location: [facility] OR;  Service: Neurosurgery;  Laterality: N/A;   SPINE SURGERY  12-08, 5-10   TOTAL HIP ARTHROPLASTY     oct 2006,   TOTAL HIP ARTHROPLASTY Right 02/09/2018   Procedure: RIGHT TOTAL HIP ARTHROPLASTY ANTERIOR APPROACH;  Surgeon: Durene Romans, MD;  Location: [facility] ORS;  Service: Orthopedics;  Laterality: Right;    Family History  Problem Relation Age of Onset   Diabetes Mother    Coronary artery disease Mother 48   Hypertension Mother    Other Father 45   Colon polyps Neg Hx    Esophageal cancer Neg Hx    Rectal cancer Neg Hx    Stomach cancer Neg Hx     Social History Social History  Tobacco Use   Smoking status: Never   Smokeless tobacco: Never  Vaping Use   Vaping Use: Never used  Substance Use Topics   Alcohol use: No   Drug use: No    Current Outpatient Medications  Medication Sig Dispense Refill   acetaminophen (TYLENOL) 500 MG tablet Take 500 mg by mouth daily as needed for moderate pain.     aspirin 81 MG chewable tablet Chew 1 tablet (81 mg total) by mouth daily.     calcium carbonate (TUMS - DOSED IN MG ELEMENTAL CALCIUM) 500 MG chewable tablet Chew 2 tablets by mouth daily as needed for indigestion or heartburn.      Cholecalciferol (VITAMIN D3) 2000 units TABS Take 2,000 Units by mouth daily.     gabapentin (NEURONTIN) 100 MG capsule TAKE TWO CAPSULES BY MOUTH AT BEDTIME 180 capsule 0   losartan (COZAAR) 50 MG tablet TAKE ONE TABLET EVERY MORNING AND TAKE 1/2 TABLET EVERY EVENING 270 tablet 3   MAGNESIUM CITRATE PO Take by mouth daily.     Menthol, Topical Analgesic, (BIOFREEZE) 4 % GEL Apply 1 application topically daily as needed (pain).     metoprolol succinate (TOPROL-XL) 25 MG 24 hr tablet TAKE ONE TABLET BY MOUTH DAILY 90 tablet 3   Multiple Vitamins-Minerals (HAIR SKIN AND NAILS FORMULA PO) Take 1 tablet by mouth daily.     nitroGLYCERIN (NITROSTAT) 0.4 MG SL tablet Place 1 tablet (0.4 mg total) under the tongue every 5 (five) minutes as needed for chest pain. 25 tablet 3   Omega-3 Fatty Acids (FISH OIL) 1200 MG CAPS Take 1 capsule (1,200 mg total) by mouth daily. 30 capsule 1   rosuvastatin (CRESTOR) 40 MG tablet TAKE ONE TABLET BY MOUTH DAILY 90 tablet 3   ticagrelor (BRILINTA) 60 MG TABS tablet Take 1 tablet (60 mg total) by mouth 2 (two) times daily. 180 tablet 1   tiZANidine (ZANAFLEX) 4 MG tablet Take 1 tablet (4 mg total) by mouth every 8 (eight) hours as needed for muscle spasms. 90 tablet 0   traMADol (ULTRAM) 50 MG tablet TAKE ONE TABLET BY MOUTH EVERY 6 HOURS AS NEEDED FOR moderate pain 120 tablet 2   vitamin B-12 (CYANOCOBALAMIN) 1000 MCG tablet Take 1,000 mcg by mouth daily.     vitamin C (ASCORBIC ACID) 250 MG tablet Take 250 mg by mouth daily.     No current facility-administered medications for this visit.    Allergies  Allergen Reactions   Codeine Other (See Comments)    "made me sick"    Review of Systems  Constitutional:  Negative for activity change and fatigue.  HENT: Negative.    Eyes: Negative.   Respiratory:  Negative for chest tightness and shortness of breath.   Cardiovascular:  Negative for chest pain and leg swelling.  Gastrointestinal: Negative.   Endocrine:  Negative.   Genitourinary:  Positive for frequency.  Musculoskeletal:  Positive for arthralgias.  Skin: Negative.   Neurological:        Chronic back pain  Hematological: Negative.   Psychiatric/Behavioral:  The patient is nervous/anxious.     BP 129/83   Pulse 67   Resp 20   Ht 6\' 3"  (1.905 m)   Wt 210 lb (95.3 kg)   SpO2 99% Comment: RA  BMI 26.25 kg/m  Physical Exam Constitutional:      Appearance: Normal appearance. He is normal weight.  HENT:     Head: Normocephalic and atraumatic.  Eyes:  Extraocular Movements: Extraocular movements intact.     Pupils: Pupils are equal, round, and reactive to light.  Neck:     Vascular: No carotid bruit.  Cardiovascular:     Rate and Rhythm: Normal rate and regular rhythm.     Heart sounds: Normal heart sounds. No murmur heard. Pulmonary:     Effort: Pulmonary effort is normal.     Breath sounds: Normal breath sounds.  Musculoskeletal:        General: No swelling.  Neurological:     General: No focal deficit present.     Mental Status: He is alert and oriented to person, place, and time.  Psychiatric:        Mood and Affect: Mood normal.        Behavior: Behavior normal.      Diagnostic Tests:  Narrative & Impression  CLINICAL DATA:  Thoracic aortic aneurysm.   EXAM: CT ANGIOGRAPHY CHEST WITH CONTRAST   TECHNIQUE: Multidetector CT imaging of the chest was performed using the standard protocol during bolus administration of intravenous contrast. Multiplanar CT image reconstructions and MIPs were obtained to evaluate the vascular anatomy.   RADIATION DOSE REDUCTION: This exam was performed according to the departmental dose-optimization program which includes automated exposure control, adjustment of the mA and/or kV according to patient size and/or use of iterative reconstruction technique.   CONTRAST:  75mL ISOVUE-370 IOPAMIDOL (ISOVUE-370) INJECTION 76%   COMPARISON:  May 16, 2021.    FINDINGS: Cardiovascular: Aortic root is dilated at 4.7 cm. There is no evidence of thoracic aortic aneurysm elsewhere or dissection. Great vessels are widely patent. Normal cardiac size. No pericardial effusion. Coronary artery calcifications are noted.   Mediastinum/Nodes: No enlarged mediastinal, hilar, or axillary lymph nodes. Thyroid gland, trachea, and esophagus demonstrate no significant findings.   Lungs/Pleura: No pneumothorax or pleural effusion is noted. Grossly stable 7 mm nodule is noted in right lower lobe best seen on image number 98 of series 7.   Upper Abdomen: No acute abnormality.   Musculoskeletal: No chest wall abnormality. No acute or significant osseous findings.   Review of the MIP images confirms the above findings.   IMPRESSION: Aortic root dilatation is noted at 4.7 cm which is slightly enlarged compared to prior exam. There is no other evidence of thoracic aortic aneurysm or dissection.   Coronary artery calcifications are noted suggesting coronary artery disease.   Grossly stable 7 mm nodule seen in right lower lobe. Follow-up unenhanced chest CT in 12 months is recommended to ensure stability.   Aortic Atherosclerosis (ICD10-I70.0).     Electronically Signed   By: Lupita Raider M.D.   On: 06/20/2022 09:13     Impression: This 77 year old gentleman has an aortic root aneurysm that was measured by radiology of 4.7 cm compared to his previous scan in 05/2021 when it was measured at 4.5 cm.  By echocardiogram in 03/2021 was measured at 4.9 cm.  I have personally measured his most recent CT scan compared to the one in 2022 and I do not think there is any significant difference.  His aortic root is still well below the surgical threshold of 5.5 cm.  He has a trileaflet aortic valve with mild aortic insufficiency.  I reviewed the CTA and echocardiogram images with him and answered his questions.  I stressed the importance of continued good blood  pressure control in preventing further enlargement and acute aortic dissection.  I advised him against doing any heavy lifting or strenuous physical  activity that may require a Valsalva maneuver and could suddenly raise his blood pressure to high levels.  Plan:  I will see him back in January 2025 with a CTA of the chest for aortic surveillance.  I spent 30 minutes performing this consultation and > 50% of this time was spent face to face counseling and coordinating the care of this patient's aortic root aneurysm.  Alleen Borne, MD [facility] [phone]

## 2022-12-02 ENCOUNTER — Encounter: Payer: Self-pay | Admitting: Internal Medicine

## 2022-12-09 ENCOUNTER — Telehealth: Payer: Self-pay | Admitting: Physician Assistant

## 2022-12-09 NOTE — Telephone Encounter (Signed)
Returned call to patient. Pt just wanted to clarify restricted diet starting on 12/12/22. We reviewed foods that are appropriate and foods that pt should avoid. Pt asked about a beet salad and was advised to hold this since it can change the color of his stools. Pt is aware that he will be on a clear liquid diet the entire day prior to his procedure. Pt verbalized understanding and had no concerns at the end of the call.

## 2022-12-09 NOTE — Telephone Encounter (Signed)
Inbound call requesting to speak with a nurse in regards up coming procedure. Please advise

## 2022-12-17 ENCOUNTER — Ambulatory Visit (AMBULATORY_SURGERY_CENTER): Payer: Medicare Other | Admitting: Internal Medicine

## 2022-12-17 ENCOUNTER — Encounter: Payer: Self-pay | Admitting: Internal Medicine

## 2022-12-17 VITALS — BP 114/78 | HR 46 | Temp 98.0°F | Resp 19 | Ht 72.0 in | Wt 210.0 lb

## 2022-12-17 DIAGNOSIS — D124 Benign neoplasm of descending colon: Secondary | ICD-10-CM | POA: Diagnosis not present

## 2022-12-17 DIAGNOSIS — F32A Depression, unspecified: Secondary | ICD-10-CM | POA: Diagnosis not present

## 2022-12-17 DIAGNOSIS — I10 Essential (primary) hypertension: Secondary | ICD-10-CM | POA: Diagnosis not present

## 2022-12-17 DIAGNOSIS — D123 Benign neoplasm of transverse colon: Secondary | ICD-10-CM | POA: Diagnosis not present

## 2022-12-17 DIAGNOSIS — Z1211 Encounter for screening for malignant neoplasm of colon: Secondary | ICD-10-CM | POA: Diagnosis not present

## 2022-12-17 DIAGNOSIS — D122 Benign neoplasm of ascending colon: Secondary | ICD-10-CM

## 2022-12-17 MED ORDER — SODIUM CHLORIDE 0.9 % IV SOLN
500.0000 mL | Freq: Once | INTRAVENOUS | Status: DC
Start: 2022-12-17 — End: 2022-12-17

## 2022-12-17 NOTE — Progress Notes (Signed)
Pt's states no medical or surgical changes since previsit or office visit. 

## 2022-12-17 NOTE — Progress Notes (Signed)
Called to room to assist during endoscopic procedure.  Patient ID and intended procedure confirmed with present staff. Received instructions for my participation in the procedure from the performing physician.  

## 2022-12-17 NOTE — Progress Notes (Signed)
Uneventful anesthetic. Report to pacu rn. Vss. Care resumed by rn. 

## 2022-12-17 NOTE — Patient Instructions (Signed)
Handout on hemorrhoids, polyps, and diverticulosis given to patient Await pathology results Resume previous diet and continue present medications - Resume Bilinta (ticagrelor) at prior dose tomorrow 12/18/23, refer to managing physician for further adjustment of therapy Repeat colonoscopy for surveillance will be determined based off of pathology results   YOU HAD AN ENDOSCOPIC PROCEDURE TODAY AT THE [facility]:   Refer to the procedure report that was given to you for any specific questions about what was found during the examination.  If the procedure report does not answer your questions, please call your gastroenterologist to clarify.  If you requested that your care partner not be given the details of your procedure findings, then the procedure report has been included in a sealed envelope for you to review at your convenience later.  YOU SHOULD EXPECT: Some feelings of bloating in the abdomen. Passage of more gas than usual.  Walking can help get rid of the air that was put into your GI tract during the procedure and reduce the bloating. If you had a lower endoscopy (such as a colonoscopy or flexible sigmoidoscopy) you may notice spotting of blood in your stool or on the toilet paper. If you underwent a bowel prep for your procedure, you may not have a normal bowel movement for a few days.  Please Note:  You might notice some irritation and congestion in your nose or some drainage.  This is from the oxygen used during your procedure.  There is no need for concern and it should clear up in a day or so.  SYMPTOMS TO REPORT IMMEDIATELY:  Following lower endoscopy (colonoscopy or flexible sigmoidoscopy):  Excessive amounts of blood in the stool  Significant tenderness or worsening of abdominal pains  Swelling of the abdomen that is new, acute  Fever of 100F or higher  For urgent or emergent issues, a gastroenterologist can be reached at any hour by calling [phone]. Do not  use MyChart messaging for urgent concerns.    DIET:  We do recommend a small meal at first, but then you may proceed to your regular diet.  Drink plenty of fluids but you should avoid alcoholic beverages for 24 hours.  ACTIVITY:  You should plan to take it easy for the rest of today and you should NOT DRIVE or use heavy machinery until tomorrow (because of the sedation medicines used during the test).    FOLLOW UP: Our staff will call the number listed on your records the next business day following your procedure.  We will call around 7:15- 8:00 am to check on you and address any questions or concerns that you may have regarding the information given to you following your procedure. If we do not reach you, we will leave a message.     If any biopsies were taken you will be contacted by phone or by letter within the next 1-3 weeks.  Please call us at [phone] if you have not heard about the biopsies in 3 weeks.    SIGNATURES/CONFIDENTIALITY: You and/or your care partner have signed paperwork which will be entered into your electronic medical record.  These signatures attest to the fact that that the information above on your After Visit Summary has been reviewed and is understood.  Full responsibility of the confidentiality of this discharge information lies with you and/or your care-partner.

## 2022-12-17 NOTE — Progress Notes (Signed)
GASTROENTEROLOGY PROCEDURE H&P NOTE   Primary Care Physician: Karie Georges, MD    Reason for Procedure:  Screening for colorectal cancer  Plan:    Colonoscopy  Patient is appropriate for endoscopic procedure(s) in the ambulatory ([facility]) setting.  The nature of the procedure, as well as the risks, benefits, and alternatives were carefully and thoroughly reviewed with the patient. Ample time for discussion and questions allowed. The patient understood, was satisfied, and agreed to proceed.     HPI: Bryan [name] is a 77 y.o. male who presents for screening colonoscopy.  Medical history as below.  Tolerated the prep.  No recent chest pain or shortness of breath.  No abdominal pain today. Brilinta hold x 5 days  Past Medical History:  Diagnosis Date   Anxiety    Cancer (HCC)    facial skin cancer   Depression    GERD (gastroesophageal reflux disease)    takes tums rarely   Hematuria    1 time in july 2019   Hypertension    Osteoarthritis    Spinal stenosis    ST elevation myocardial infarction involving right coronary artery [facility])     Past Surgical History:  Procedure Laterality Date   ACHILLES TENDON SURGERY  02/2006   ANTERIOR LAT LUMBAR FUSION N/A 11/11/2017   Procedure: LUMBAR TWO- LUMBAR THREE, LUMBAR THREE- LUMBAR FOUR, LUMBAR FOUR- LUMBAR FIVE ANTERIOLATERAL INTERBODY ARTHRODESIS;  Surgeon: Shirlean Kelly, MD;  Location: [facility] OR;  Service: Neurosurgery;  Laterality: N/A;   COLONOSCOPY  2003   COLONOSCOPY     CORONARY STENT INTERVENTION N/A 01/17/2021   Procedure: CORONARY STENT INTERVENTION;  Surgeon: Lennette Bihari, MD;  Location: [facility] INVASIVE CV LAB;  Service: Cardiovascular;  Laterality: N/A;   CORONARY/GRAFT ACUTE MI REVASCULARIZATION N/A 01/17/2021   Procedure: Coronary/Graft Acute MI Revascularization;  Surgeon: Lennette Bihari, MD;  Location: [facility] INVASIVE CV LAB;  Service: Cardiovascular;  Laterality: N/A;   CYSTOSCOPY N/A 05/21/2018   Procedure:  CYSTOSCOPY FLEXIBLE;  Surgeon: Jerilee Field, MD;  Location: [facility];  Service: Urology;  Laterality: N/A;   KNEE SURGERY Left 1991 and 2001   X2 arthroscopy   LEFT HEART CATH AND CORONARY ANGIOGRAPHY N/A 01/17/2021   Procedure: LEFT HEART CATH AND CORONARY ANGIOGRAPHY;  Surgeon: Lennette Bihari, MD;  Location: [facility] INVASIVE CV LAB;  Service: Cardiovascular;  Laterality: N/A;   LUMBAR PERCUTANEOUS PEDICLE SCREW 3 LEVEL N/A 11/11/2017   Procedure: LUMBAR PERCUTANEOUS PEDICLE SCREW PLACEMENT LUMBAR TWO-THREE, LUMBER THREE-FOUR, LUMBAR FOUR-FIVE;  Surgeon: Shirlean Kelly, MD;  Location: [facility] OR;  Service: Neurosurgery;  Laterality: N/A;   SPINE SURGERY  12-08, 5-10   TOTAL HIP ARTHROPLASTY     oct 2006,   TOTAL HIP ARTHROPLASTY Right 02/09/2018   Procedure: RIGHT TOTAL HIP ARTHROPLASTY ANTERIOR APPROACH;  Surgeon: Durene Romans, MD;  Location: [facility] ORS;  Service: Orthopedics;  Laterality: Right;    Prior to Admission medications   Medication Sig Start Date End Date Taking? Authorizing Provider  acetaminophen (TYLENOL) 500 MG tablet Take 500 mg by mouth daily as needed for moderate pain.   Yes [provider]  aspirin 81 MG chewable tablet Chew 1 tablet (81 mg total) by mouth daily. 01/20/21  Yes Barrett, Joline Salt, PA-C  calcium carbonate (TUMS - DOSED IN MG ELEMENTAL CALCIUM) 500 MG chewable tablet Chew 2 tablets by mouth daily as needed for indigestion or heartburn.   Yes [provider]  Cholecalciferol (VITAMIN D3) 2000 units TABS Take  2,000 Units by mouth daily.   Yes [provider]  gabapentin (NEURONTIN) 100 MG capsule TAKE TWO CAPSULES BY MOUTH AT BEDTIME 11/19/22  Yes Karie Georges, MD  losartan (COZAAR) 50 MG tablet TAKE ONE TABLET EVERY MORNING AND TAKE 1/2 TABLET EVERY EVENING 05/28/22  Yes Lennette Bihari, MD  MAGNESIUM CITRATE PO Take by mouth daily.   Yes [provider]  Menthol, Topical Analgesic, (BIOFREEZE) 4 % GEL Apply 1  application topically daily as needed (pain).   Yes [provider]  metoprolol succinate (TOPROL-XL) 25 MG 24 hr tablet TAKE ONE TABLET BY MOUTH DAILY 01/14/22  Yes Barrett, Joline Salt, PA-C  Multiple Vitamins-Minerals (HAIR SKIN AND NAILS FORMULA PO) Take 1 tablet by mouth daily.   Yes [provider]  rosuvastatin (CRESTOR) 40 MG tablet TAKE ONE TABLET BY MOUTH DAILY 01/14/22  Yes Barrett, Joline Salt, PA-C  traMADol (ULTRAM) 50 MG tablet TAKE ONE TABLET BY MOUTH EVERY 6 HOURS AS NEEDED FOR moderate pain 10/28/22  Yes Karie Georges, MD  vitamin B-12 (CYANOCOBALAMIN) 1000 MCG tablet Take 1,000 mcg by mouth daily.   Yes [provider]  vitamin C (ASCORBIC ACID) 250 MG tablet Take 250 mg by mouth daily.   Yes [provider]  nitroGLYCERIN (NITROSTAT) 0.4 MG SL tablet Place 1 tablet (0.4 mg total) under the tongue every 5 (five) minutes as needed for chest pain. 01/19/21   Barrett, Joline Salt, PA-C  Omega-3 Fatty Acids (FISH OIL) 1200 MG CAPS Take 1 capsule (1,200 mg total) by mouth daily. 01/19/21   Barrett, Joline Salt, PA-C  ticagrelor (BRILINTA) 60 MG TABS tablet Take 1 tablet (60 mg total) by mouth 2 (two) times daily. 10/29/22   Lennette Bihari, MD  tiZANidine (ZANAFLEX) 4 MG tablet Take 1 tablet (4 mg total) by mouth every 8 (eight) hours as needed for muscle spasms. 09/04/22   Karie Georges, MD    Current Outpatient Medications  Medication Sig Dispense Refill   acetaminophen (TYLENOL) 500 MG tablet Take 500 mg by mouth daily as needed for moderate pain.     aspirin 81 MG chewable tablet Chew 1 tablet (81 mg total) by mouth daily.     calcium carbonate (TUMS - DOSED IN MG ELEMENTAL CALCIUM) 500 MG chewable tablet Chew 2 tablets by mouth daily as needed for indigestion or heartburn.     Cholecalciferol (VITAMIN D3) 2000 units TABS Take 2,000 Units by mouth daily.     gabapentin (NEURONTIN) 100 MG capsule TAKE TWO CAPSULES BY MOUTH AT BEDTIME 180 capsule 0    losartan (COZAAR) 50 MG tablet TAKE ONE TABLET EVERY MORNING AND TAKE 1/2 TABLET EVERY EVENING 270 tablet 3   MAGNESIUM CITRATE PO Take by mouth daily.     Menthol, Topical Analgesic, (BIOFREEZE) 4 % GEL Apply 1 application topically daily as needed (pain).     metoprolol succinate (TOPROL-XL) 25 MG 24 hr tablet TAKE ONE TABLET BY MOUTH DAILY 90 tablet 3   Multiple Vitamins-Minerals (HAIR SKIN AND NAILS FORMULA PO) Take 1 tablet by mouth daily.     rosuvastatin (CRESTOR) 40 MG tablet TAKE ONE TABLET BY MOUTH DAILY 90 tablet 3   traMADol (ULTRAM) 50 MG tablet TAKE ONE TABLET BY MOUTH EVERY 6 HOURS AS NEEDED FOR moderate pain 120 tablet 2   vitamin B-12 (CYANOCOBALAMIN) 1000 MCG tablet Take 1,000 mcg by mouth daily.     vitamin C (ASCORBIC ACID) 250 MG tablet Take 250 mg by  mouth daily.     nitroGLYCERIN (NITROSTAT) 0.4 MG SL tablet Place 1 tablet (0.4 mg total) under the tongue every 5 (five) minutes as needed for chest pain. 25 tablet 3   Omega-3 Fatty Acids (FISH OIL) 1200 MG CAPS Take 1 capsule (1,200 mg total) by mouth daily. 30 capsule 1   ticagrelor (BRILINTA) 60 MG TABS tablet Take 1 tablet (60 mg total) by mouth 2 (two) times daily. 180 tablet 1   tiZANidine (ZANAFLEX) 4 MG tablet Take 1 tablet (4 mg total) by mouth every 8 (eight) hours as needed for muscle spasms. 90 tablet 0   Current Facility-Administered Medications  Medication Dose Route Frequency Provider Last Rate Last Admin   0.9 %  sodium chloride infusion  500 mL Intravenous Once Jobe Mutch, Carie Caddy, MD        Allergies as of 12/17/2022 - Review Complete 12/17/2022  Allergen Reaction Noted   Codeine Other (See Comments) 09/30/2012    Family History  Problem Relation Age of Onset   Diabetes Mother    Coronary artery disease Mother 37   Hypertension Mother    Other Father 80   Colon polyps Neg Hx    Esophageal cancer Neg Hx    Rectal cancer Neg Hx    Stomach cancer Neg Hx     Social History   Socioeconomic History    Marital status: Married    Spouse name: Not on file   Number of children: 0   Years of education: Not on file   Highest education level: Not on file  Occupational History   Occupation: retired  Tobacco Use   Smoking status: Never   Smokeless tobacco: Never  Vaping Use   Vaping Use: Never used  Substance and Sexual Activity   Alcohol use: No   Drug use: No   Sexual activity: Not on file  Other Topics Concern   Not on file  Social History Narrative   Divorced, Exercises regularly   Social Determinants of Health   Financial Resource Strain: Low Risk  (03/17/2022)   Overall Financial Resource Strain (CARDIA)    Difficulty of Paying Living Expenses: Not hard at all  Food Insecurity: No Food Insecurity (09/08/2022)   Hunger Vital Sign    Worried About Running Out of Food in the Last Year: Never true    Ran Out of Food in the Last Year: Never true  Transportation Needs: No Transportation Needs (03/17/2022)   PRAPARE - Administrator, Civil Service (Medical): No    Lack of Transportation (Non-Medical): No  Physical Activity: Sufficiently Active (03/17/2022)   Exercise Vital Sign    Days of Exercise per Week: 5 days    Minutes of Exercise per Session: 30 min  Stress: No Stress Concern Present (03/17/2022)   [employer] of Occupational Health - Occupational Stress Questionnaire    Feeling of Stress : Not at all  Social Connections: Moderately Isolated (03/13/2021)   Social Connection and Isolation Panel [NHANES]    Frequency of Communication with Friends and Family: More than three times a week    Frequency of Social Gatherings with Friends and Family: More than three times a week    Attends Religious Services: Never    Database administrator or Organizations: No    Attends Banker Meetings: Never    Marital Status: Married  Catering manager Violence: Not At Risk (03/13/2021)   Humiliation, Afraid, Rape, and Kick questionnaire    Fear of Current  or  Ex-Partner: No    Emotionally Abused: No    Physically Abused: No    Sexually Abused: No    Physical Exam: Vital signs in last 24 hours: @BP  128/66   Pulse 69   Temp 98 F (36.7 C)   Ht 6' (1.829 m)   Wt 210 lb (95.3 kg)   SpO2 97%   BMI 28.48 kg/m  GEN: NAD EYE: Sclerae anicteric ENT: MMM CV: Non-tachycardic Pulm: CTA b/l GI: Soft, NT/ND NEURO:  Alert & Oriented x 3   Erick Blinks, MD [facility]  12/17/2022 10:45 AM

## 2022-12-17 NOTE — Op Note (Signed)
[facility] Patient Name: Zyrus Brockhoff Procedure Date: 12/17/2022 10:45 AM MRN: [id number] Endoscopist: Beverley Fiedler , MD, [id number] Age: 77 Referring MD:  Date of Birth: [birth year] Gender: Male Account #: [id number] Procedure:                Colonoscopy Indications:              Screening for colorectal malignant neoplasm, Last                            colonoscopy 10 years ago Medicines:                Monitored Anesthesia Care Procedure:                Pre-Anesthesia Assessment:                           - Prior to the procedure, a History and Physical                            was performed, and patient medications and                            allergies were reviewed. The patient's tolerance of                            previous anesthesia was also reviewed. The risks                            and benefits of the procedure and the sedation                            options and risks were discussed with the patient.                            All questions were answered, and informed consent                            was obtained. Prior Anticoagulants: The patient has                            taken Brilinta (ticagrelor), last dose was 5 days                            prior to procedure. ASA Grade Assessment: III - A                            patient with severe systemic disease. After                            reviewing the risks and benefits, the patient was                            deemed in satisfactory condition to undergo the  procedure.                           After obtaining informed consent, the colonoscope                            was passed under direct vision. Throughout the                            procedure, the patient's blood pressure, pulse, and                            oxygen saturations were monitored continuously. The                            CF [id number] #[id number] was introduced through the anus                             and advanced to the cecum, identified by palpation.                            The colonoscopy was performed without difficulty.                            The patient tolerated the procedure well. The                            quality of the bowel preparation was good. The                            ileocecal valve, appendiceal orifice, and rectum                            were photographed. Scope In: 10:56:23 AM Scope Out: 11:12:32 AM Scope Withdrawal Time: 0 hours 12 minutes 49 seconds  Total Procedure Duration: 0 hours 16 minutes 9 seconds  Findings:                 The digital rectal exam was normal.                           A 6 mm polyp was found in the ascending colon. The                            polyp was sessile. The polyp was removed with a                            cold snare. Resection and retrieval were complete.                           Three sessile polyps were found in the transverse                            colon. The polyps were 3 to 5 mm in size. These  polyps were removed with a cold snare. Resection                            and retrieval were complete.                           Two sessile polyps were found in the descending                            colon. The polyps were 4 to 5 mm in size. These                            polyps were removed with a cold snare. Resection                            and retrieval were complete.                           Many large-mouthed, medium-mouthed and                            small-mouthed diverticula were found in the sigmoid                            colon, descending colon and ascending colon.                           Internal hemorrhoids were found during                            retroflexion. The hemorrhoids were small. Complications:            No immediate complications. Estimated Blood Loss:     Estimated blood loss was minimal. Impression:               -  One 6 mm polyp in the ascending colon, removed                            with a cold snare. Resected and retrieved.                           - Three 3 to 5 mm polyps in the transverse colon,                            removed with a cold snare. Resected and retrieved.                           - Two 4 to 5 mm polyps in the descending colon,                            removed with a cold snare. Resected and retrieved.                           - Severe diverticulosis in the sigmoid  colon, in                            the descending colon and in the ascending colon.                           - Internal hemorrhoids. Recommendation:           - Patient has a contact number available for                            emergencies. The signs and symptoms of potential                            delayed complications were discussed with the                            patient. Return to normal activities tomorrow.                            Written discharge instructions were provided to the                            patient.                           - Resume previous diet.                           - Continue present medications.                           - Resume Brilinta (ticagrelor) at prior dose                            tomorrow. Refer to managing physician for further                            adjustment of therapy.                           - Await pathology results.                           - Repeat colonoscopy is recommended for                            surveillance. The colonoscopy date will be                            determined after pathology results from today's                            exam become available for review. Beverley Fiedler, MD 12/17/2022 11:15:40 AM This report has been signed electronically.

## 2022-12-19 ENCOUNTER — Telehealth: Payer: Self-pay

## 2022-12-19 NOTE — Telephone Encounter (Signed)
Follow up call to pt, lm for pt to call if having any difficulty with normal activities or eating and drinking.  Also to call if any other questions or concerns.  

## 2022-12-23 ENCOUNTER — Encounter: Payer: Self-pay | Admitting: Internal Medicine

## 2023-01-06 ENCOUNTER — Other Ambulatory Visit: Payer: Self-pay | Admitting: Physician Assistant

## 2023-01-08 ENCOUNTER — Encounter: Payer: Self-pay | Admitting: Family Medicine

## 2023-01-08 ENCOUNTER — Ambulatory Visit (INDEPENDENT_AMBULATORY_CARE_PROVIDER_SITE_OTHER): Payer: Medicare Other | Admitting: Family Medicine

## 2023-01-08 VITALS — BP 98/68 | HR 62 | Temp 98.6°F | Ht 74.5 in | Wt 205.5 lb

## 2023-01-08 DIAGNOSIS — I10 Essential (primary) hypertension: Secondary | ICD-10-CM | POA: Diagnosis not present

## 2023-01-08 DIAGNOSIS — R739 Hyperglycemia, unspecified: Secondary | ICD-10-CM

## 2023-01-08 DIAGNOSIS — E785 Hyperlipidemia, unspecified: Secondary | ICD-10-CM

## 2023-01-08 DIAGNOSIS — Z0000 Encounter for general adult medical examination without abnormal findings: Secondary | ICD-10-CM | POA: Diagnosis not present

## 2023-01-08 LAB — COMPREHENSIVE METABOLIC PANEL
ALT: 20 U/L (ref 0–53)
AST: 24 U/L (ref 0–37)
Albumin: 4.2 g/dL (ref 3.5–5.2)
Alkaline Phosphatase: 46 U/L (ref 39–117)
BUN: 16 mg/dL (ref 6–23)
CO2: 29 mEq/L (ref 19–32)
Calcium: 9.9 mg/dL (ref 8.4–10.5)
Chloride: 102 mEq/L (ref 96–112)
Creatinine, Ser: 1.05 mg/dL (ref 0.40–1.50)
GFR: 69.89 mL/min (ref >60.00)
Glucose, Bld: 115 mg/dL — ABNORMAL HIGH (ref 70–99)
Potassium: 4.1 mEq/L (ref 3.5–5.1)
Sodium: 138 mEq/L (ref 135–145)
Total Bilirubin: 1.6 mg/dL — ABNORMAL HIGH (ref 0.2–1.2)
Total Protein: 6.6 g/dL (ref 6.0–8.3)

## 2023-01-08 LAB — LIPID PANEL
Cholesterol: 91 mg/dL (ref 0–200)
HDL: 41.7 mg/dL (ref >39.00)
LDL Cholesterol: 28 mg/dL (ref 0–99)
NonHDL: 49.46
Total CHOL/HDL Ratio: 2
Triglycerides: 105 mg/dL (ref 0.0–149.0)
VLDL: 21 mg/dL (ref 0.0–40.0)

## 2023-01-08 NOTE — Progress Notes (Signed)
Complete physical exam  Patient: Bryan [name]   DOB: 1946/06/11   77 y.o. Male  MRN: [id number]  Subjective:    Chief Complaint  Patient presents with   Annual Exam    IRWIN TORAN is a 77 y.o. male who presents today for a complete physical exam. He reports consuming a general diet. Gym/ health club routine includes swimming. He generally feels well. He reports sleeping well. He does not have additional problems to discuss today.    Most recent fall risk assessment:    01/08/2023    7:58 AM  Fall Risk   Falls in the past year? 1  Number falls in past yr: 0  Injury with Fall? 1  Risk for fall due to : No Fall Risks  Follow up Falls evaluation completed     Most recent depression screenings:    01/08/2023    7:58 AM 03/17/2022    4:19 PM  PHQ 2/9 Scores  PHQ - 2 Score 0 0  PHQ- 9 Score 0     Vision:Within last year and Dental: No current dental problems and Receives regular dental care  Patient Active Problem List   Diagnosis Date Noted   Coronary atherosclerosis of native coronary artery 01/03/2022    Priority: High   Chronic systolic congestive heart failure (HCC) 01/06/2022   STEMI involving right coronary artery (HCC) 01/17/2021   ST elevation myocardial infarction involving right coronary artery (HCC)    Right groin pain 09/07/2019   S/P right THA, AA 02/09/2018   S/P hip replacement 02/09/2018   Osteoarthritis of right hip joint due to dysplasia 01/04/2018   Lumbar stenosis with neurogenic claudication 11/11/2017   PROSTATE SPECIFIC ANTIGEN, ELEVATED 07/19/2009   Raised prostate specific antigen 07/19/2009   BACK PAIN 06/11/2007   Essential hypertension 05/21/2007   Osteoarthritis 05/21/2007      Patient Care Team: Karie Georges, MD as PCP - General (Family Medicine) Lennette Bihari, MD as PCP - Cardiology (Cardiology) Jerilee Field, MD as Consulting Physician (Urology) Arminda Resides, MD as Consulting Physician (Dermatology) Karie Soda, MD as Consulting Physician (General Surgery) Durene Romans, MD as Consulting Physician (Orthopedic Surgery) Shirlean Kelly, MD as Consulting Physician (Neurosurgery) [place], Milas Kocher, [facility] (Inactive) (Pharmacist)   Outpatient Medications Prior to Visit  Medication Sig   acetaminophen (TYLENOL) 500 MG tablet Take 500 mg by mouth daily as needed for moderate pain.   aspirin 81 MG chewable tablet Chew 1 tablet (81 mg total) by mouth daily.   calcium carbonate (TUMS - DOSED IN MG ELEMENTAL CALCIUM) 500 MG chewable tablet Chew 2 tablets by mouth daily as needed for indigestion or heartburn.   Cholecalciferol (VITAMIN D3) 2000 units TABS Take 2,000 Units by mouth daily.   gabapentin (NEURONTIN) 100 MG capsule TAKE TWO CAPSULES BY MOUTH AT BEDTIME   losartan (COZAAR) 50 MG tablet TAKE ONE TABLET EVERY MORNING AND TAKE 1/2 TABLET EVERY EVENING   MAGNESIUM CITRATE PO Take by mouth daily.   Menthol, Topical Analgesic, (BIOFREEZE) 4 % GEL Apply 1 application topically daily as needed (pain).   metoprolol succinate (TOPROL-XL) 25 MG 24 hr tablet TAKE ONE TABLET BY MOUTH DAILY   Multiple Vitamins-Minerals (HAIR SKIN AND NAILS FORMULA PO) Take 1 tablet by mouth daily.   nitroGLYCERIN (NITROSTAT) 0.4 MG SL tablet Place 1 tablet (0.4 mg total) under the tongue every 5 (five) minutes as needed for chest pain.   Omega-3 Fatty Acids (FISH OIL) 1200 MG CAPS Take  1 capsule (1,200 mg total) by mouth daily.   rosuvastatin (CRESTOR) 40 MG tablet TAKE ONE TABLET BY MOUTH DAILY   ticagrelor (BRILINTA) 60 MG TABS tablet Take 1 tablet (60 mg total) by mouth 2 (two) times daily.   tiZANidine (ZANAFLEX) 4 MG tablet Take 1 tablet (4 mg total) by mouth every 8 (eight) hours as needed for muscle spasms.   traMADol (ULTRAM) 50 MG tablet TAKE ONE TABLET BY MOUTH EVERY 6 HOURS AS NEEDED FOR moderate pain   vitamin B-12 (CYANOCOBALAMIN) 1000 MCG tablet Take 1,000 mcg by mouth daily.   vitamin C (ASCORBIC ACID) 250 MG  tablet Take 250 mg by mouth daily.   No facility-administered medications prior to visit.    Review of Systems  HENT:  Negative for hearing loss.   Eyes:  Negative for blurred vision.  Respiratory:  Negative for shortness of breath.   Cardiovascular:  Negative for chest pain.  Gastrointestinal: Negative.   Genitourinary: Negative.   Musculoskeletal:  Negative for back pain.  Neurological:  Negative for headaches.  Psychiatric/Behavioral:  Negative for depression.   All other systems reviewed and are negative.      Objective:     BP 98/68 (BP Location: Left Arm, Patient Position: Sitting, Cuff Size: Large)   Pulse 62   Temp 98.6 F (37 C) (Oral)   Ht 6' 2.5" (1.892 m)   Wt 205 lb 8 oz (93.2 kg)   SpO2 98%   BMI 26.03 kg/m    Physical Exam Vitals reviewed.  Constitutional:      Appearance: Normal appearance. He is well-groomed and normal weight.  HENT:     Right Ear: Tympanic membrane and ear canal normal.     Left Ear: Tympanic membrane and ear canal normal.     Mouth/Throat:     Mouth: Mucous membranes are moist.     Pharynx: No posterior oropharyngeal erythema.  Eyes:     Extraocular Movements: Extraocular movements intact.     Conjunctiva/sclera: Conjunctivae normal.  Neck:     Thyroid: No thyromegaly.  Cardiovascular:     Rate and Rhythm: Normal rate and regular rhythm.     Heart sounds: S1 normal and S2 normal. No murmur heard. Pulmonary:     Effort: Pulmonary effort is normal.     Breath sounds: Normal breath sounds and air entry. No rales.  Abdominal:     General: Abdomen is flat. Bowel sounds are normal.  Musculoskeletal:     Right lower leg: No edema.     Left lower leg: No edema.  Lymphadenopathy:     Cervical: No cervical adenopathy.  Neurological:     General: No focal deficit present.     Mental Status: He is alert and oriented to person, place, and time.     Gait: Gait is intact.  Psychiatric:        Mood and Affect: Mood and affect  normal.      No results found for any visits on 01/08/23.     Assessment & Plan:    Routine Health Maintenance and Physical Exam  Immunization History  Administered Date(s) Administered   Fluad Quad(high Dose 65+) 03/02/2019, 03/05/2020, 03/20/2021, 03/13/2022   Influenza Split 03/30/2013   Influenza, High Dose Seasonal PF 03/26/2015, 03/25/2016, 04/13/2017, 03/23/2018   Influenza,inj,Quad PF,6+ Mos 03/21/2014   PFIZER(Purple Top)SARS-COV-2 Vaccination 07/10/2019, 08/01/2019, 03/31/2020   PNEUMOCOCCAL CONJUGATE-20 06/21/2021   Pneumococcal Conjugate-13 03/21/2014   Pneumococcal Polysaccharide-23 03/26/2015   Td 07/19/2009, 05/24/2020  Zoster Recombinant(Shingrix) 09/14/2020, 02/15/2021    Health Maintenance  Topic Date Due   COVID-19 Vaccine (4 - 2023-24 season) 01/24/2023 (Originally 02/14/2022)   Hepatitis C Screening  07/10/2023 (Originally 06/05/1964)   INFLUENZA VACCINE  01/15/2023   Medicare Annual Wellness (AWV)  03/18/2023   Colonoscopy  12/16/2025   DTaP/Tdap/Td (3 - Tdap) 05/24/2030   Pneumonia Vaccine 1+ Years old  Completed   Zoster Vaccines- Shingrix  Completed   HPV VACCINES  Aged Out    Discussed health benefits of physical activity, and encouraged him to engage in regular exercise appropriate for his age and condition.  Essential hypertension -     Comprehensive metabolic panel; Future  Dyslipidemia -     Lipid panel; Future  Routine general medical examination at a health care facility  Normal physical exam findings. Handouts given on healthy eating and exercise, which he already participates in. Annual labs ordered today.   Return in 6 months (on 07/11/2023).     Karie Georges, MD

## 2023-01-08 NOTE — Patient Instructions (Signed)
Health Maintenance, Male Adopting a healthy lifestyle and getting preventive care are important in promoting health and wellness. Ask your health care provider about: The right schedule for you to have regular tests and exams. Things you can do on your own to prevent diseases and keep yourself healthy. What should I know about diet, weight, and exercise? Eat a healthy diet  Eat a diet that includes plenty of vegetables, fruits, low-fat dairy products, and lean protein. Do not eat a lot of foods that are high in solid fats, added sugars, or sodium. Maintain a healthy weight Body mass index (BMI) is a measurement that can be used to identify possible weight problems. It estimates body fat based on height and weight. Your health care provider can help determine your BMI and help you achieve or maintain a healthy weight. Get regular exercise Get regular exercise. This is one of the most important things you can do for your health. Most adults should: Exercise for at least 150 minutes each week. The exercise should increase your heart rate and make you sweat (moderate-intensity exercise). Do strengthening exercises at least twice a week. This is in addition to the moderate-intensity exercise. Spend less time sitting. Even light physical activity can be beneficial. Watch cholesterol and blood lipids Have your blood tested for lipids and cholesterol at 75 years of age, then have this test every 5 years. You may need to have your cholesterol levels checked more often if: Your lipid or cholesterol levels are high. You are older than 75 years of age. You are at high risk for heart disease. What should I know about cancer screening? Many types of cancers can be detected early and may often be prevented. Depending on your health history and family history, you may need to have cancer screening at various ages. This may include screening for: Colorectal cancer. Prostate cancer. Skin cancer. Lung  cancer. What should I know about heart disease, diabetes, and high blood pressure? Blood pressure and heart disease High blood pressure causes heart disease and increases the risk of stroke. This is more likely to develop in people who have high blood pressure readings or are overweight. Talk with your health care provider about your target blood pressure readings. Have your blood pressure checked: Every 3-5 years if you are 18-39 years of age. Every year if you are 40 years old or older. If you are between the ages of 65 and 75 and are a current or former smoker, ask your health care provider if you should have a one-time screening for abdominal aortic aneurysm (AAA). Diabetes Have regular diabetes screenings. This checks your fasting blood sugar level. Have the screening done: Once every three years after age 45 if you are at a normal weight and have a low risk for diabetes. More often and at a younger age if you are overweight or have a high risk for diabetes. What should I know about preventing infection? Hepatitis B If you have a higher risk for hepatitis B, you should be screened for this virus. Talk with your health care provider to find out if you are at risk for hepatitis B infection. Hepatitis C Blood testing is recommended for: Everyone born from 1945 through 1965. Anyone with known risk factors for hepatitis C. Sexually transmitted infections (STIs) You should be screened each year for STIs, including gonorrhea and chlamydia, if: You are sexually active and are younger than 75 years of age. You are older than 75 years of age and your   health care provider tells you that you are at risk for this type of infection. Your sexual activity has changed since you were last screened, and you are at increased risk for chlamydia or gonorrhea. Ask your health care provider if you are at risk. Ask your health care provider about whether you are at high risk for HIV. Your health care provider  may recommend a prescription medicine to help prevent HIV infection. If you choose to take medicine to prevent HIV, you should first get tested for HIV. You should then be tested every 3 months for as long as you are taking the medicine. Follow these instructions at home: Alcohol use Do not drink alcohol if your health care provider tells you not to drink. If you drink alcohol: Limit how much you have to 0-2 drinks a day. Know how much alcohol is in your drink. In the [place] one drink equals one 12 oz bottle of beer (355 mL), one 5 oz glass of wine (148 mL), or one 1 oz glass of hard liquor (44 mL). Lifestyle Do not use any products that contain nicotine or tobacco. These products include cigarettes, chewing tobacco, and vaping devices, such as e-cigarettes. If you need help quitting, ask your health care provider. Do not use street drugs. Do not share needles. Ask your health care provider for help if you need support or information about quitting drugs. General instructions Schedule regular health, dental, and eye exams. Stay current with your vaccines. Tell your health care provider if: You often feel depressed. You have ever been abused or do not feel safe at home. Summary Adopting a healthy lifestyle and getting preventive care are important in promoting health and wellness. Follow your health care provider's instructions about healthy diet, exercising, and getting tested or screened for diseases. Follow your health care provider's instructions on monitoring your cholesterol and blood pressure. This information is not intended to replace advice given to you by your health care provider. Make sure you discuss any questions you have with your health care provider. Document Revised: 10/22/2020 Document Reviewed: 10/22/2020 [employer] Patient Education  2024 [employer]  

## 2023-01-09 ENCOUNTER — Telehealth: Payer: Self-pay | Admitting: Family Medicine

## 2023-01-09 NOTE — Addendum Note (Signed)
Addended by: Johnella Moloney on: 01/09/2023 09:25 AM   Modules accepted: Orders

## 2023-01-09 NOTE — Telephone Encounter (Signed)
Pt requesting another call to discuss labs

## 2023-01-12 ENCOUNTER — Other Ambulatory Visit: Payer: Medicare Other

## 2023-01-12 ENCOUNTER — Other Ambulatory Visit: Payer: Self-pay | Admitting: Physician Assistant

## 2023-01-12 ENCOUNTER — Telehealth: Payer: Self-pay | Admitting: Family Medicine

## 2023-01-12 DIAGNOSIS — R739 Hyperglycemia, unspecified: Secondary | ICD-10-CM

## 2023-01-12 NOTE — Telephone Encounter (Signed)
Copy printed and left at the front desk for pick up.

## 2023-01-12 NOTE — Telephone Encounter (Signed)
I was speaking with the patient previously and he put me on hold.  Left a message requesting the patient call back and leave a detailed message with any questions he has and this can be sent to PCP for review.

## 2023-01-12 NOTE — Telephone Encounter (Signed)
Pt has an appt for lab today and would like a copy of blood work results and would like to pick up today

## 2023-02-02 DIAGNOSIS — H2513 Age-related nuclear cataract, bilateral: Secondary | ICD-10-CM | POA: Diagnosis not present

## 2023-02-02 DIAGNOSIS — H43813 Vitreous degeneration, bilateral: Secondary | ICD-10-CM | POA: Diagnosis not present

## 2023-02-02 DIAGNOSIS — H40013 Open angle with borderline findings, low risk, bilateral: Secondary | ICD-10-CM | POA: Diagnosis not present

## 2023-02-02 DIAGNOSIS — H02831 Dermatochalasis of right upper eyelid: Secondary | ICD-10-CM | POA: Diagnosis not present

## 2023-02-02 DIAGNOSIS — H02834 Dermatochalasis of left upper eyelid: Secondary | ICD-10-CM | POA: Diagnosis not present

## 2023-02-02 DIAGNOSIS — D3132 Benign neoplasm of left choroid: Secondary | ICD-10-CM | POA: Diagnosis not present

## 2023-02-09 DIAGNOSIS — L570 Actinic keratosis: Secondary | ICD-10-CM | POA: Diagnosis not present

## 2023-02-09 DIAGNOSIS — D692 Other nonthrombocytopenic purpura: Secondary | ICD-10-CM | POA: Diagnosis not present

## 2023-02-09 DIAGNOSIS — L821 Other seborrheic keratosis: Secondary | ICD-10-CM | POA: Diagnosis not present

## 2023-02-09 DIAGNOSIS — Z85828 Personal history of other malignant neoplasm of skin: Secondary | ICD-10-CM | POA: Diagnosis not present

## 2023-02-09 DIAGNOSIS — L814 Other melanin hyperpigmentation: Secondary | ICD-10-CM | POA: Diagnosis not present

## 2023-02-09 DIAGNOSIS — D225 Melanocytic nevi of trunk: Secondary | ICD-10-CM | POA: Diagnosis not present

## 2023-02-09 DIAGNOSIS — L820 Inflamed seborrheic keratosis: Secondary | ICD-10-CM | POA: Diagnosis not present

## 2023-02-19 ENCOUNTER — Other Ambulatory Visit: Payer: Self-pay | Admitting: Family Medicine

## 2023-02-25 ENCOUNTER — Other Ambulatory Visit: Payer: Self-pay | Admitting: Family Medicine

## 2023-02-25 DIAGNOSIS — M48062 Spinal stenosis, lumbar region with neurogenic claudication: Secondary | ICD-10-CM

## 2023-03-12 ENCOUNTER — Ambulatory Visit: Payer: Medicare Other | Admitting: Family Medicine

## 2023-03-12 DIAGNOSIS — Z0000 Encounter for general adult medical examination without abnormal findings: Secondary | ICD-10-CM | POA: Diagnosis not present

## 2023-03-12 NOTE — Progress Notes (Signed)
Patient unable to obtain vital signs due to telehealth visit

## 2023-03-12 NOTE — Progress Notes (Signed)
PATIENT CHECK-IN and HEALTH RISK ASSESSMENT QUESTIONNAIRE:  -completed by phone/video for upcoming Medicare Preventive Visit  Pre-Visit Check-in: 1)Vitals (height, wt, BP, etc) - record in vitals section for visit on day of visit Request home vitals (wt, BP, etc.) and enter into vitals, THEN update Vital Signs SmartPhrase below at the top of the HPI. See below.  2)Review and Update Medications, Allergies PMH, Surgeries, Social history in Epic 3)Hospitalizations in the last year with date/reason? No  4)Review and Update Care Team (patient's specialists) in Epic 5) Complete PHQ9 in Epic  6) Complete Fall Screening in Epic 7)Review all Health Maintenance Due and order under PCP if not done.  Medicare Wellness Patient Questionnaire:  Answer theses question about your habits: Do you drink alcohol? No   Have you ever smoked? No  How many packs a day do/did you smoke? N/A Do you use smokeless tobacco? No Do you use an illicit drugs? No Do you exercises? Yes IF so, what type and how many days/minutes per week?Swimming, 30-40 daily and does water exercise, and does golf sometimes.  Are you sexually active? Yes Number of partners? 1 Reports eating healthy - trying to avoid sugar in the foods Typical breakfast: cheerios and blueberries Eats lots of veggies Typical lunch: Varies Typical dinner: Salad  Typical snacks: Ice cream - carbsmart and , Peanuts  Beverages: Water  Answer theses question about you: Can you perform most household chores? Yes Do you find it hard to follow a conversation in a noisy room?Sometime Do you often ask people to speak up or repeat themselves? No Do you feel that you have a problem with memory?No Do you balance your checkbook and or bank acounts?Yes Do you feel safe at home? Yes Last dentist visit? 2 months ago Do you need assistance with any of the following: Please note if so No  Driving?  Feeding yourself?  Getting from bed to chair?  Getting to the  toilet?  Bathing or showering?  Dressing yourself?  Managing money?  Climbing a flight of stairs  Preparing meals?    Do you have Advanced Directives in place (Living Will, Healthcare Power or Attorney)?  no   Last eye Exam and location? [facility],    Do you currently use prescribed or non-prescribed narcotic or opioid pain medications? Yes   Do you have a history or close family history of breast, ovarian, tubal or peritoneal cancer or a family member with BRCA (breast cancer susceptibility 1 and 2) gene mutations? No  Request home vitals (wt, BP, etc.) and enter into vitals, THEN update Vital Signs SmartPhrase below at the top of the HPI. See below.   Nurse/Assistant Credentials/time stamp: Mg 4:41 pm   ----------------------------------------------------------------------------------------------------------------------------------------------------------------------------------------------------------------------  Because this visit was a virtual/telehealth visit, some criteria may be missing or patient reported. Any vitals not documented were not able to be obtained and vitals that have been documented are patient reported.    MEDICARE ANNUAL PREVENTIVE CARE VISIT WITH PROVIDER (Welcome to Medicare, initial annual wellness or annual wellness exam)  Virtual Visit via Phone Note  I connected with Bryan [name] on 03/12/23  by phone and verified that I am speaking with the correct person using two identifiers.  Location patient: home Location provider:work or home office Persons participating in the virtual visit: patient, provider  Concerns and/or follow up today: no concerns   See HM section in Epic for other details of completed HM.    ROS: negative for report of fevers, unintentional weight  loss, vision changes, vision loss, hearing loss or change, chest pain, sob, hemoptysis, melena, hematochezia, hematuria, falls, bleeding or bruising, thoughts of suicide or  self harm, memory loss  Patient-completed extensive health risk assessment - reviewed and discussed with the patient: See Health Risk Assessment completed with patient prior to the visit either above or in recent phone note. This was reviewed in detailed with the patient today and appropriate recommendations, orders and referrals were placed as needed per Summary below and patient instructions.   Review of Medical History: -PMH, PSH, Family History and current specialty and care providers reviewed and updated and listed below   Patient Care Team: Karie Georges, MD as PCP - General (Family Medicine) Lennette Bihari, MD as PCP - Cardiology (Cardiology) Jerilee Field, MD as Consulting Physician (Urology) Arminda Resides, MD as Consulting Physician (Dermatology) Karie Soda, MD as Consulting Physician (General Surgery) Durene Romans, MD as Consulting Physician (Orthopedic Surgery) Shirlean Kelly, MD as Consulting Physician (Neurosurgery) Sherrill Raring, [facility] (Pharmacist)   Past Medical History:  Diagnosis Date   Anxiety    Cancer [facility])    facial skin cancer   Depression    GERD (gastroesophageal reflux disease)    takes tums rarely   Hematuria    1 time in july 2019   Hypertension    Osteoarthritis    Spinal stenosis    ST elevation myocardial infarction involving right coronary artery [facility])     Past Surgical History:  Procedure Laterality Date   ACHILLES TENDON SURGERY  02/2006   ANTERIOR LAT LUMBAR FUSION N/A 11/11/2017   Procedure: LUMBAR TWO- LUMBAR THREE, LUMBAR THREE- LUMBAR FOUR, LUMBAR FOUR- LUMBAR FIVE ANTERIOLATERAL INTERBODY ARTHRODESIS;  Surgeon: Shirlean Kelly, MD;  Location: [facility] OR;  Service: Neurosurgery;  Laterality: N/A;   COLONOSCOPY  2003   COLONOSCOPY     CORONARY STENT INTERVENTION N/A 01/17/2021   Procedure: CORONARY STENT INTERVENTION;  Surgeon: Lennette Bihari, MD;  Location: [facility] INVASIVE CV LAB;  Service: Cardiovascular;  Laterality: N/A;    CORONARY/GRAFT ACUTE MI REVASCULARIZATION N/A 01/17/2021   Procedure: Coronary/Graft Acute MI Revascularization;  Surgeon: Lennette Bihari, MD;  Location: [facility] INVASIVE CV LAB;  Service: Cardiovascular;  Laterality: N/A;   CYSTOSCOPY N/A 05/21/2018   Procedure: CYSTOSCOPY FLEXIBLE;  Surgeon: Jerilee Field, MD;  Location: [facility];  Service: Urology;  Laterality: N/A;   KNEE SURGERY Left 1991 and 2001   X2 arthroscopy   LEFT HEART CATH AND CORONARY ANGIOGRAPHY N/A 01/17/2021   Procedure: LEFT HEART CATH AND CORONARY ANGIOGRAPHY;  Surgeon: Lennette Bihari, MD;  Location: [facility] INVASIVE CV LAB;  Service: Cardiovascular;  Laterality: N/A;   LUMBAR PERCUTANEOUS PEDICLE SCREW 3 LEVEL N/A 11/11/2017   Procedure: LUMBAR PERCUTANEOUS PEDICLE SCREW PLACEMENT LUMBAR TWO-THREE, LUMBER THREE-FOUR, LUMBAR FOUR-FIVE;  Surgeon: Shirlean Kelly, MD;  Location: [facility] OR;  Service: Neurosurgery;  Laterality: N/A;   SPINE SURGERY  12-08, 5-10   TOTAL HIP ARTHROPLASTY     oct 2006,   TOTAL HIP ARTHROPLASTY Right 02/09/2018   Procedure: RIGHT TOTAL HIP ARTHROPLASTY ANTERIOR APPROACH;  Surgeon: Durene Romans, MD;  Location: [facility] ORS;  Service: Orthopedics;  Laterality: Right;    Social History   Socioeconomic History   Marital status: Married    Spouse name: Not on file   Number of children: 0   Years of education: Not on file   Highest education level: Not on file  Occupational History   Occupation: retired  Tobacco Use  Smoking status: Never   Smokeless tobacco: Never  Vaping Use   Vaping status: Never Used  Substance and Sexual Activity   Alcohol use: No   Drug use: No   Sexual activity: Not on file  Other Topics Concern   Not on file  Social History Narrative   Divorced, Exercises regularly   Social Determinants of Health   Financial Resource Strain: Low Risk  (03/12/2023)   Overall Financial Resource Strain (CARDIA)    Difficulty of Paying Living Expenses: Not hard at all  Food  Insecurity: No Food Insecurity (09/08/2022)   Hunger Vital Sign    Worried About Running Out of Food in the Last Year: Never true    Ran Out of Food in the Last Year: Never true  Transportation Needs: No Transportation Needs (03/12/2023)   PRAPARE - Administrator, Civil Service (Medical): No    Lack of Transportation (Non-Medical): No  Physical Activity: Sufficiently Active (03/12/2023)   Exercise Vital Sign    Days of Exercise per Week: 7 days    Minutes of Exercise per Session: 30 min  Stress: No Stress Concern Present (03/12/2023)   [employer] of Occupational Health - Occupational Stress Questionnaire    Feeling of Stress : Only a little  Social Connections: Moderately Integrated (03/12/2023)   Social Connection and Isolation Panel [NHANES]    Frequency of Communication with Friends and Family: More than three times a week    Frequency of Social Gatherings with Friends and Family: More than three times a week    Attends Religious Services: Never    Database administrator or Organizations: Yes    Attends Engineer, structural: More than 4 times per year    Marital Status: Married  Catering manager Violence: Not At Risk (03/12/2023)   Humiliation, Afraid, Rape, and Kick questionnaire    Fear of Current or Ex-Partner: No    Emotionally Abused: No    Physically Abused: No    Sexually Abused: No    Family History  Problem Relation Age of Onset   Diabetes Mother    Coronary artery disease Mother 36   Hypertension Mother    Other Father 36   Colon polyps Neg Hx    Esophageal cancer Neg Hx    Rectal cancer Neg Hx    Stomach cancer Neg Hx     Current Outpatient Medications on File Prior to Visit  Medication Sig Dispense Refill   acetaminophen (TYLENOL) 500 MG tablet Take 500 mg by mouth daily as needed for moderate pain.     aspirin 81 MG chewable tablet Chew 1 tablet (81 mg total) by mouth daily.     calcium carbonate (TUMS - DOSED IN MG ELEMENTAL  CALCIUM) 500 MG chewable tablet Chew 2 tablets by mouth daily as needed for indigestion or heartburn.     Cholecalciferol (VITAMIN D3) 2000 units TABS Take 2,000 Units by mouth daily.     gabapentin (NEURONTIN) 100 MG capsule TAKE TWO CAPSULES BY MOUTH AT BEDTIME 180 capsule 1   losartan (COZAAR) 50 MG tablet TAKE ONE TABLET EVERY MORNING AND TAKE 1/2 TABLET EVERY EVENING 270 tablet 3   MAGNESIUM CITRATE PO Take by mouth daily.     Menthol, Topical Analgesic, (BIOFREEZE) 4 % GEL Apply 1 application topically daily as needed (pain).     metoprolol succinate (TOPROL-XL) 25 MG 24 hr tablet TAKE ONE TABLET BY MOUTH DAILY 90 tablet 2   Multiple Vitamins-Minerals (HAIR SKIN  AND NAILS FORMULA PO) Take 1 tablet by mouth daily.     nitroGLYCERIN (NITROSTAT) 0.4 MG SL tablet Place 1 tablet (0.4 mg total) under the tongue every 5 (five) minutes as needed for chest pain. 25 tablet 3   Omega-3 Fatty Acids (FISH OIL) 1200 MG CAPS Take 1 capsule (1,200 mg total) by mouth daily. 30 capsule 1   rosuvastatin (CRESTOR) 40 MG tablet TAKE ONE TABLET BY MOUTH DAILY 90 tablet 0   ticagrelor (BRILINTA) 60 MG TABS tablet Take 1 tablet (60 mg total) by mouth 2 (two) times daily. 180 tablet 1   tiZANidine (ZANAFLEX) 4 MG tablet Take 1 tablet (4 mg total) by mouth every 8 (eight) hours as needed for muscle spasms. 90 tablet 0   traMADol (ULTRAM) 50 MG tablet TAKE ONE TABLET BY MOUTH EVERY 6 HOURS AS NEEDED FOR moderate pain 120 tablet 2   vitamin B-12 (CYANOCOBALAMIN) 1000 MCG tablet Take 1,000 mcg by mouth daily.     vitamin C (ASCORBIC ACID) 250 MG tablet Take 250 mg by mouth daily.     No current facility-administered medications on file prior to visit.    Allergies  Allergen Reactions   Codeine Other (See Comments)    "made me sick"       Physical Exam Vitals requested from patient and listed below if patient had equipment and was able to obtain at home for this virtual visit: There were no vitals filed for  this visit. Estimated body mass index is 26.03 kg/m as calculated from the following:   Height as of 01/08/23: 6' 2.5" (1.892 m).   Weight as of 01/08/23: 205 lb 8 oz (93.2 kg).  EKG (optional): deferred due to virtual visit  GENERAL: alert, oriented, no acute distress detected; full vision exam deferred due to pandemic and/or virtual encounter  PSYCH/NEURO: pleasant and cooperative, no obvious depression or anxiety, speech and thought processing grossly intact, Cognitive function grossly intact  Flowsheet Row Office Visit from [facility]/08/2023 in [facility] [place]  PHQ-9 Total Score 0           03/12/2023    4:18 PM 01/08/2023    7:58 AM 03/17/2022    4:19 PM 01/06/2022    7:58 AM 06/21/2021    8:16 AM  Depression screen PHQ 2/9  Decreased Interest 0 0 0 0 0  Down, Depressed, Hopeless 0 0 0 1 0  PHQ - 2 Score 0 0 0 1 0  Altered sleeping  0  0 0  Tired, decreased energy  0  0 0  Change in appetite  0  0 0  Feeling bad or failure about yourself   0  0 0  Trouble concentrating  0  0 0  Moving slowly or fidgety/restless  0  0 0  Suicidal thoughts  0  0 0  PHQ-9 Score  0  1 0       01/19/2021    7:48 AM 03/13/2021    3:49 PM 03/17/2022    4:19 PM 01/08/2023    7:58 AM 03/12/2023    4:17 PM  Fall Risk  Falls in the past year?  0 0 1 1  Was there an injury with Fall?  0 0 1 1  Fall Risk Category Calculator  0 0 2 2  Fall Risk Category (Retired)  Low Low    (RETIRED) Patient Fall Risk Level Moderate fall risk  Low fall risk    Patient at Risk for  Falls Due to  Impaired vision Medication side effect No Fall Risks No Fall Risks  Fall risk Follow up  Falls prevention discussed Falls prevention discussed;Education provided;Falls evaluation completed Falls evaluation completed Falls evaluation completed  Slipped on a wet floor in a restaurant - reports balance is good and never had trouble.   SUMMARY AND PLAN:  Encounter for Medicare annual wellness  exam   Discussed applicable health maintenance/preventive health measures and advised and referred or ordered per patient preferences: -discussed vaccines due and where to get each Health Maintenance  Topic Date Due   INFLUENZA VACCINE  01/15/2023   COVID-19 Vaccine (4 - 2023-24 season) 02/15/2023   Hepatitis C Screening  07/10/2023 (Originally 06/05/1964)   Medicare Annual Wellness (AWV)  03/11/2024   Colonoscopy  12/16/2025   DTaP/Tdap/Td (3 - Tdap) 05/24/2030   Pneumonia Vaccine 47+ Years old  Completed   Zoster Vaccines- Shingrix  Completed   HPV VACCINES  Aged Out      Education and counseling on the following was provided based on the above review of health and a plan/checklist for the patient, along with additional information discussed, was provided for the patient in the patient instructions :  -Advised on importance of completing advanced directives, discussed options for completing and provided information in patient instructions as well  -Provided counseling for hx of fall and safe balance exercises that can be done at home to improve balance - see patient instructions. He feels his balance is pretty good and he is getting a significant amount of exercise.  -Advised and counseled on a healthy lifestyle -Reviewed patient's current diet. Advised and counseled on a whole foods based healthy diet. A summary of a healthy diet was provided in the Patient Instructions.  -reviewed patient's current physical activity level and discussed exercise guidelines for adults.  -Advise yearly dental visits at minimum and regular eye exams   Follow up: see patient instructions   Patient Instructions  I really enjoyed getting to talk with you today! I am available on Tuesdays and Thursdays for virtual visits if you have any questions or concerns, or if I can be of any further assistance.   CHECKLIST FROM ANNUAL WELLNESS VISIT:  -Follow up (please call to schedule if not scheduled after  visit):   -yearly for annual wellness visit with primary care office  Here is a list of your preventive care/health maintenance measures and the plan for each if any are due:  PLAN For any measures below that may be due:   Health Maintenance  Topic Date Due   INFLUENZA VACCINE  01/15/2023   COVID-19 Vaccine (4 - 2023-24 season) 02/15/2023   Hepatitis C Screening  07/10/2023 (Originally 06/05/1964)   Medicare Annual Wellness (AWV)  03/11/2024   Colonoscopy  12/16/2025   DTaP/Tdap/Td (3 - Tdap) 05/24/2030   Pneumonia Vaccine 28+ Years old  Completed   Zoster Vaccines- Shingrix  Completed   HPV VACCINES  Aged Out    -See a dentist at least yearly  -Get your eyes checked and then per your eye specialist's recommendations  -Other issues addressed today:   -I have included below further information regarding a healthy whole foods based diet, physical activity guidelines for adults, stress management and opportunities for social connections. I hope you find this information useful.   -----------------------------------------------------------------------------------------------------------------------------------------------------------------------------------------------------------------------------------------------------------  NUTRITION: -eat real food: lots of colorful vegetables (half the plate) and fruits -5-7 servings of vegetables and fruits per day (fresh or steamed is best), exp. 2 servings  of vegetables with lunch and dinner and 2 servings of fruit per day. Berries and greens such as kale and collards are great choices.  -consume on a regular basis: whole grains (make sure first ingredient on label contains the word "whole"), fresh fruits, fish, nuts, seeds, healthy oils (such as olive oil, avocado oil, grape seed oil) -may eat small amounts of dairy and lean meat on occasion, but avoid processed meats such as ham, bacon, lunch meat, etc. -drink water -try to avoid fast  food and pre-packaged foods, processed meat -most experts advise limiting sodium to < 2300mg  per day, should limit further is any chronic conditions such as high blood pressure, heart disease, diabetes, etc. The American Heart Association advised that < 1500mg  is is ideal -try to avoid foods that contain any ingredients with names you do not recognize  -try to avoid sugar/sweets (except for the natural sugar that occurs in fresh fruit) -try to avoid sweet drinks -try to avoid white rice, white bread, pasta (unless whole grain), white or yellow potatoes  EXERCISE GUIDELINES FOR ADULTS: -if you wish to increase your physical activity, do so gradually and with the approval of your doctor -STOP and seek medical care immediately if you have any chest pain, chest discomfort or trouble breathing when starting or increasing exercise  -move and stretch your body, legs, feet and arms when sitting for long periods -Physical activity guidelines for optimal health in adults: -least 150 minutes per week of aerobic exercise (can talk, but not sing) once approved by your doctor, 20-30 minutes of sustained activity or two 10 minute episodes of sustained activity every day.  -resistance training at least 2 days per week if approved by your doctor -balance exercises 3+ days per week:   Stand somewhere where you have something sturdy to hold onto if you lose balance.    1) lift up on toes, start with 5x per day and work up to 20x   2) stand and lift on leg straight out to the side so that foot is a few inches of the floor, start with 5x each side and work up to 20x each side   3) stand on one foot, start with 5 seconds each side and work up to 20 seconds on each side  If you need ideas or help with getting more active:  -Silver sneakers https://tools.silversneakers.com  -Walk with a Doc: http://www.duncan-williams.com/  -try to include resistance (weight lifting/strength building) and balance exercises twice per  week: or the following link for ideas: http://castillo-powell.com/  BuyDucts.dk  STRESS MANAGEMENT: -can try meditating, or just sitting quietly with deep breathing while intentionally relaxing all parts of your body for 5 minutes daily -if you need further help with stress, anxiety or depression please follow up with your primary doctor or contact the wonderful folks at [facility]: [phone]  SOCIAL CONNECTIONS: -options in [place] if you wish to engage in more social and exercise related activities:  -Silver sneakers https://tools.silversneakers.com  -Walk with a Doc: http://www.duncan-williams.com/  -Check out the [facility] Active Adults 50+ section on the [place] of [employer] (hiking clubs, book clubs, cards and games, chess, exercise classes, aquatic classes and much more) - see the website for details: https://www.[place].gov/departments/parks-recreation/active-adults50  -YouTube has lots of exercise videos for different ages and abilities as well  -[facility] (a variety of indoor and outdoor inperson activities for adults). [phone]. [address].  -Virtual Online Classes (a variety of topics): see seniorplanet.org or call [phone]  -  consider volunteering at a school, hospice center, church, senior center or elsewhere         ADVANCED HEALTHCARE DIRECTIVES:  [place] Advanced Directives assistance:   ExpressWeek.com.cy  Everyone should have advanced health care directives in place. This is so that you get the care you want, should you ever be in a situation where you are unable to make your own medical decisions.   From the [place] Advanced Directive Website: "Advance Health Care Directives are legal documents in which you give written instructions about your health care if,  in the future, you cannot speak for yourself.   A health care power of attorney allows you to name a person you trust to make your health care decisions if you cannot make them yourself. A declaration of a desire for a natural death (or living will) is document, which states that you desire not to have your life prolonged by extraordinary measures if you have a terminal or incurable illness or if you are in a vegetative state. An advance instruction for mental health treatment makes a declaration of instructions, information and preferences regarding your mental health treatment. It also states that you are aware that the advance instruction authorizes a mental health treatment provider to act according to your wishes. It may also outline your consent or refusal of mental health treatment. A declaration of an anatomical gift allows anyone over the age of 34 to make a gift by will, organ donor card or other document."   Please see the following website or an elder law attorney for forms, FAQs and for completion of advanced directives: [place] TEFL teacher Health Care Directives Advance Health Care Directives (http://guzman.com/)  Or copy and paste the following to your web browser: PoshChat.fi    Terressa Koyanagi, DO

## 2023-03-12 NOTE — Patient Instructions (Signed)
I really enjoyed getting to talk with you today! I am available on Tuesdays and Thursdays for virtual visits if you have any questions or concerns, or if I can be of any further assistance.   CHECKLIST FROM ANNUAL WELLNESS VISIT:  -Follow up (please call to schedule if not scheduled after visit):   -yearly for annual wellness visit with primary care office  Here is a list of your preventive care/health maintenance measures and the plan for each if any are due:  PLAN For any measures below that may be due:   Health Maintenance  Topic Date Due   INFLUENZA VACCINE  01/15/2023   COVID-19 Vaccine (4 - 2023-24 season) 02/15/2023   Hepatitis C Screening  07/10/2023 (Originally 06/05/1964)   Medicare Annual Wellness (AWV)  03/11/2024   Colonoscopy  12/16/2025   DTaP/Tdap/Td (3 - Tdap) 05/24/2030   Pneumonia Vaccine 74+ Years old  Completed   Zoster Vaccines- Shingrix  Completed   HPV VACCINES  Aged Out    -See a dentist at least yearly  -Get your eyes checked and then per your eye specialist's recommendations  -Other issues addressed today:   -I have included below further information regarding a healthy whole foods based diet, physical activity guidelines for adults, stress management and opportunities for social connections. I hope you find this information useful.   -----------------------------------------------------------------------------------------------------------------------------------------------------------------------------------------------------------------------------------------------------------  NUTRITION: -eat real food: lots of colorful vegetables (half the plate) and fruits -5-7 servings of vegetables and fruits per day (fresh or steamed is best), exp. 2 servings of vegetables with lunch and dinner and 2 servings of fruit per day. Berries and greens such as kale and collards are great choices.  -consume on a regular basis: whole grains (make sure first  ingredient on label contains the word "whole"), fresh fruits, fish, nuts, seeds, healthy oils (such as olive oil, avocado oil, grape seed oil) -may eat small amounts of dairy and lean meat on occasion, but avoid processed meats such as ham, bacon, lunch meat, etc. -drink water -try to avoid fast food and pre-packaged foods, processed meat -most experts advise limiting sodium to < 2300mg  per day, should limit further is any chronic conditions such as high blood pressure, heart disease, diabetes, etc. The American Heart Association advised that < 1500mg  is is ideal -try to avoid foods that contain any ingredients with names you do not recognize  -try to avoid sugar/sweets (except for the natural sugar that occurs in fresh fruit) -try to avoid sweet drinks -try to avoid white rice, white bread, pasta (unless whole grain), white or yellow potatoes  EXERCISE GUIDELINES FOR ADULTS: -if you wish to increase your physical activity, do so gradually and with the approval of your doctor -STOP and seek medical care immediately if you have any chest pain, chest discomfort or trouble breathing when starting or increasing exercise  -move and stretch your body, legs, feet and arms when sitting for long periods -Physical activity guidelines for optimal health in adults: -least 150 minutes per week of aerobic exercise (can talk, but not sing) once approved by your doctor, 20-30 minutes of sustained activity or two 10 minute episodes of sustained activity every day.  -resistance training at least 2 days per week if approved by your doctor -balance exercises 3+ days per week:   Stand somewhere where you have something sturdy to hold onto if you lose balance.    1) lift up on toes, start with 5x per day and work up to 20x   2)  stand and lift on leg straight out to the side so that foot is a few inches of the floor, start with 5x each side and work up to 20x each side   3) stand on one foot, start with 5 seconds each  side and work up to 20 seconds on each side  If you need ideas or help with getting more active:  -Silver sneakers https://tools.silversneakers.com  -Walk with a Doc: http://www.duncan-[email or web address]/  -try to include resistance (weight lifting/strength building) and balance exercises twice per week: or the following link for ideas: http://castillo-powell.com/  BuyDucts.dk  STRESS MANAGEMENT: -can try meditating, or just sitting quietly with deep breathing while intentionally relaxing all parts of your body for 5 minutes daily -if you need further help with stress, anxiety or depression please follow up with your primary doctor or contact the wonderful folks at [facility]: [phone]  SOCIAL CONNECTIONS: -options in [place] if you wish to engage in more social and exercise related activities:  -Silver sneakers https://tools.silversneakers.com  -Walk with a Doc: http://www.duncan-[email or web address]/  -Check out the [facility] Active Adults 50+ section on the [place] of [employer] (hiking clubs, book clubs, cards and games, chess, exercise classes, aquatic classes and much more) - see the website for details: https://www.[place].gov/departments/parks-recreation/active-adults50  -YouTube has lots of exercise videos for different ages and abilities as well  -[facility] (a variety of indoor and outdoor inperson activities for adults). [phone]. [address]  -Virtual Online Classes (a variety of topics): see seniorplanet.org or call [phone]  -consider volunteering at a school, hospice center, church, senior center or elsewhere         ADVANCED HEALTHCARE DIRECTIVES:  [place] Advanced Directives assistance:   ExpressWeek.com.cy  Everyone should have advanced health care directives in place.  This is so that you get the care you want, should you ever be in a situation where you are unable to make your own medical decisions.   From the [place] Advanced Directive Website: "Advance Health Care Directives are legal documents in which you give written instructions about your health care if, in the future, you cannot speak for yourself.   A health care power of attorney allows you to name a person you trust to make your health care decisions if you cannot make them yourself. A declaration of a desire for a natural death (or living will) is document, which states that you desire not to have your life prolonged by extraordinary measures if you have a terminal or incurable illness or if you are in a vegetative state. An advance instruction for mental health treatment makes a declaration of instructions, information and preferences regarding your mental health treatment. It also states that you are aware that the advance instruction authorizes a mental health treatment provider to act according to your wishes. It may also outline your consent or refusal of mental health treatment. A declaration of an anatomical gift allows anyone over the age of 54 to make a gift by will, organ donor card or other document."   Please see the following website or an elder law attorney for forms, FAQs and for completion of advanced directives: [place] Health Care Directives Advance Health Care Directives (http://guzman.com/)  Or copy and paste the following to your web browser: [email or web address]

## 2023-03-22 ENCOUNTER — Other Ambulatory Visit: Payer: Self-pay | Admitting: Family Medicine

## 2023-03-22 DIAGNOSIS — M48062 Spinal stenosis, lumbar region with neurogenic claudication: Secondary | ICD-10-CM

## 2023-03-25 NOTE — Telephone Encounter (Signed)
Pt is calling and he is aware refill can take up to 3 business days

## 2023-04-09 ENCOUNTER — Other Ambulatory Visit: Payer: Self-pay | Admitting: Physician Assistant

## 2023-04-23 ENCOUNTER — Ambulatory Visit (HOSPITAL_COMMUNITY): Payer: Medicare Other | Attending: Internal Medicine

## 2023-04-23 DIAGNOSIS — I7121 Aneurysm of the ascending aorta, without rupture: Secondary | ICD-10-CM | POA: Diagnosis not present

## 2023-04-23 LAB — ECHOCARDIOGRAM COMPLETE
Area-P 1/2: 2.66 cm2
P 1/2 time: 1063 ms
S' Lateral: 4 cm

## 2023-04-29 ENCOUNTER — Ambulatory Visit: Payer: Medicare Other | Attending: Cardiovascular Disease | Admitting: Cardiovascular Disease

## 2023-04-29 ENCOUNTER — Encounter: Payer: Self-pay | Admitting: Cardiovascular Disease

## 2023-04-29 VITALS — BP 136/80 | HR 57 | Ht 75.0 in | Wt 207.8 lb

## 2023-04-29 DIAGNOSIS — I10 Essential (primary) hypertension: Secondary | ICD-10-CM

## 2023-04-29 DIAGNOSIS — I2510 Atherosclerotic heart disease of native coronary artery without angina pectoris: Secondary | ICD-10-CM | POA: Diagnosis not present

## 2023-04-29 DIAGNOSIS — Z8719 Personal history of other diseases of the digestive system: Secondary | ICD-10-CM | POA: Diagnosis not present

## 2023-04-29 DIAGNOSIS — I7121 Aneurysm of the ascending aorta, without rupture: Secondary | ICD-10-CM | POA: Diagnosis not present

## 2023-04-29 DIAGNOSIS — I2111 ST elevation (STEMI) myocardial infarction involving right coronary artery: Secondary | ICD-10-CM

## 2023-04-29 DIAGNOSIS — E785 Hyperlipidemia, unspecified: Secondary | ICD-10-CM | POA: Diagnosis not present

## 2023-04-29 DIAGNOSIS — R911 Solitary pulmonary nodule: Secondary | ICD-10-CM

## 2023-04-29 DIAGNOSIS — I255 Ischemic cardiomyopathy: Secondary | ICD-10-CM | POA: Diagnosis not present

## 2023-04-29 MED ORDER — CLOPIDOGREL BISULFATE 75 MG PO TABS: 75.0000 mg | ORAL_TABLET | Freq: Every day | ORAL | 3 refills | Status: DC

## 2023-04-29 NOTE — Progress Notes (Signed)
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                                             fraction  by 3D                                             volume is                                             47 %.                                 3D Volume EF:                                3D EF:        47 %                                LV EDV:       140 ml                                LV ESV:       74 ml                                LV SV:        66 ml RIGHT VENTRICLE RV Basal diam:  3.90 cm RV S prime:     12.50 cm/s TAPSE (M-mode): 1.7 cm RVSP:           15.2 mmHg LEFT ATRIUM             Index        RIGHT ATRIUM           Index LA diam:        3.10 cm 1.40 cm/m   RA Pressure: 3.00 mmHg LA Vol (A2C):   61.1 ml 27.66 ml/m  RA Area:     16.10 cm LA Vol (A4C):   33.1 ml 14.99 ml/m  RA Volume:   40.30 ml  18.24 ml/m LA Biplane Vol: 55.4 ml 25.08 ml/m  AORTIC VALVE LVOT Vmax:   62.40 cm/s LVOT Vmean:  38.800 cm/s LVOT VTI:    0.126 m AI PHT:      1063 msec  AORTA Ao Root diam: 4.60 cm Ao Asc diam:  4.90 cm MITRAL VALVE               TRICUSPID VALVE MV Area (PHT):             TR Peak grad:   12.2 mmHg MV Decel Time:             TR Vmax:         175.00 cm/s MV E velocity: 41.90 cm/s  Estimated RAP:  3.00 mmHg MV A velocity: 35.10 cm/s  RVSP:           15.2 mmHg MV E/A ratio:  1.19                            SHUNTS  Systemic VTI:  0.13 m                            Systemic Diam: 2.50 cm Zoila Shutter MD Electronically signed by Zoila Shutter MD Signature Date/Time: 04/23/2023/10:55:59 AM    Final      Additional studies/ records that were reviewed today include:   EMERGENT CATH/PCI: 01/17/2021   Mid RCA to Dist RCA lesion is 100% stenosed.   Dist RCA lesion is 30% stenosed.   RPAV lesion is 60% stenosed.   RPDA lesion is 100% stenosed.   Ost RCA to Prox RCA lesion is 80% stenosed.   Prox RCA lesion is 40% stenosed.   A stent was successfully placed.   A stent was successfully placed.   Post intervention, there is a 0% residual stenosis.   Post intervention, there is a 0% residual stenosis.   LV end diastolic pressure is moderately elevated.   Acute ST segment elevation inferior wall myocardial infarction secondary to mid-distal thrombotic occlusion of the large dominant RCA.   No significant obstructive disease in the left coronary circulation.   Very difficult but successful coronary intervention to the RCA with significant catheter backup difficulties, calcified vessel with tortuosity and needing GuideLiner support for ultimate stenting near the acute margin with a 3.5 x 15 mm Resolute Onyx Frontier stent postdilated to 3.8 mm with the 100% occlusion being reduced to 0% and successful stenting of the very proximal RCA with ultimate insertion of a 3.5 x 18 mm Resolute Onyx Frontier stent postdilated to 3.85 mm with both stenosis being reduced to 0%.  There is diffuse irregularity in the RCA with 40% proximal to mid calcified stenosis, 30% distal stenosis with 60% stenosis in the PLA vessel and thrombotic occlusion of the distal portion of the PDA vessel for which Aggrenox was initiated.    RECOMMENDATION: DAPT for minimum of 12 months but probably longer.  Aggressive lipid-lowering therapy with target LDL less than 70 and preferably in the 50s or below.  Medical therapy for concomitant CAD.       Intervention     ECHO 01/18/2021 IMPRESSIONS   1. Left ventricular ejection fraction, by estimation, is 35 to 40%. The  left ventricle has moderately decreased function. The left ventricle  demonstrates regional wall motion abnormalities (see scoring  diagram/findings for description). The left  ventricular internal cavity size was mildly dilated. Left ventricular  diastolic parameters are consistent with Grade I diastolic dysfunction  (impaired relaxation). There is severe hypokinesis of the left  ventricular, mid inferoseptal wall.   2. Right ventricular systolic function is normal. The right ventricular  size is normal.   3. Right atrial size was mildly dilated.   4. The mitral valve is normal in structure. No evidence of mitral valve  regurgitation.   5. The aortic valve is grossly normal. Aortic valve regurgitation is  mild.   6. Aortic dilatation noted. There is moderate dilatation of the ascending  aorta, measuring 45 mm.    ECHO: 03/26/2021 IMPRESSIONS   1. Left ventricular ejection fraction, by estimation, is 45 to 50%. The  left ventricle has mildly decreased function. The left ventricle  demonstrates regional wall motion abnormalities (see scoring  diagram/findings for description). Left ventricular  diastolic parameters are consistent with Grade I diastolic dysfunction  (impaired relaxation).   2. Right ventricular systolic function is normal. The right ventricular  size is normal. Tricuspid regurgitation signal is inadequate for  assessing  PA pressure.   3. The mitral valve is grossly normal. Trivial mitral valve  regurgitation. No evidence of mitral stenosis.   4. The aortic valve is tricuspid. Aortic valve regurgitation is mild. No  aortic stenosis  is present.   5. Ascending aorta is aneurysmal up to 49 mm. Would recommend cross  sectional imaging for better characterization. Aneurysm of the ascending  aorta, measuring 49 mm.   6. The inferior vena cava is normal in size with greater than 50%  respiratory variability, suggesting right atrial pressure of 3 mmHg.   Comparison(s): Changes from prior study are noted. EF has improved.  Inferior/posterior walls remain hypokinetic.   CT ANGIO CHEST: 05/16/2021 FINDINGS: Cardiovascular: Intact thoracic aorta. Negative for acute aortic process or dissection. Patent 3 vessel arch anatomy with minor atherosclerotic change.   Mild dilatation of the sinus of Valsalva measuring 45 mm.   Sinotubular junction 33 mm   Ascending thoracic aorta 36 mm   Aortic arch 30 mm   Descending thoracic aorta 30 mm.   No mediastinal hemorrhage or hematoma. Limited assessment of the pulmonary arteries. Normal heart size. No pericardial effusion. Native coronary atherosclerosis.   Central venous structures are patent. No veno-occlusive process or anomalous pulmonary venous return.   Mediastinum/Nodes: No enlarged mediastinal, hilar, or axillary lymph nodes. Thyroid gland, trachea, and esophagus demonstrate no significant findings.   Lungs/Pleura: Right lower lobe peripheral 8 mm nodule, image 101/11.   No acute airspace process, collapse or consolidation. No interstitial disease or edema. Trachea and central airways are patent. No pleural abnormality, effusion, or pneumothorax.   Upper Abdomen: No acute upper abdominal finding. Right upper pole renal cyst measures 5.5 cm.   Musculoskeletal: Fusion hardware noted of the upper lumbar spine. Degenerative changes present. No acute osseous finding.   Review of the MIP images confirms the above findings.   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 Remainder of the thoracic aorta is normal in  caliber.   No other acute intrathoracic process.   8 mm right lower lobe pulmonary nodule. Non-contrast chest CT at 6-12 months is recommended. If the nodule is stable at time of repeat CT, then future CT at 18-24 months (from today's scan) is considered optional for low-risk patients, but is recommended for high-risk patients. This recommendation follows the consensus statement: Guidelines for Management of Incidental Pulmonary Nodules Detected on CT Images: From the Fleischner Society 2017; Radiology 2017; 284:228-243.   Native coronary atherosclerosis   Aortic Atherosclerosis (ICD10-I70.0).   CT ANGI CHEST/AORTA: 06/20/2022 FINDINGS: Cardiovascular: Aortic root is dilated at 4.7 cm. There is no evidence of thoracic aortic aneurysm elsewhere or dissection. Great vessels are widely patent. Normal cardiac size. No pericardial effusion. Coronary artery calcifications are noted.   Mediastinum/Nodes: No enlarged mediastinal, hilar, or axillary lymph nodes. Thyroid gland, trachea, and esophagus demonstrate no significant findings.   Lungs/Pleura: No pneumothorax or pleural effusion is noted. Grossly stable 7 mm nodule is noted in right lower lobe best seen on image number 98 of series 7.   Upper Abdomen: No acute abnormality.   Musculoskeletal: No chest wall abnormality. No acute or significant osseous findings.   Review of the MIP images confirms the above findings.   IMPRESSION: Aortic root dilatation is noted at 4.7 cm which is slightly enlarged compared to prior exam. There is no other evidence of thoracic aortic aneurysm or dissection.   Coronary artery calcifications are noted suggesting coronary artery  disease.   Grossly stable 7 mm nodule seen in right lower lobe. Follow-up unenhanced chest CT in 12 months is recommended to ensure stability.   Aortic Atherosclerosis (ICD10-I70.0).    ECHO: 04/23/2023  1. Left ventricular ejection fraction, by estimation, is  45 to 50%. Left  ventricular ejection fraction by 3D volume is 47 %. The left ventricle has  mildly decreased function. The left ventricle demonstrates regional wall  motion abnormalities (see  scoring diagram/findings for description). There is mild asymmetric left  ventricular hypertrophy of the basal-septal segment. Left ventricular  diastolic parameters were normal.   2. Right ventricular systolic function is normal. The right ventricular  size is normal. There is normal pulmonary artery systolic pressure. The  estimated right ventricular systolic pressure is 15.2 mmHg.   3. The mitral valve is abnormal. Trivial mitral valve regurgitation.   4. The aortic valve is tricuspid. Aortic valve regurgitation is trivial.   5. Aortic dilatation noted. There is moderate dilatation of the aortic  root, measuring 46 mm. There is moderate dilatation of the ascending  aorta, measuring 49 mm.   6. The inferior vena cava is normal in size with greater than 50%  respiratory variability, suggesting right atrial pressure of 3 mmHg.   Comparison(s): No significant change from prior study. 03/26/2021: LVEF  45-50%, inferior/inferoseptal hypokinesis.   FINDINGS   Left Ventricle: Left ventricular ejection fraction, by estimation, is 45  to 50%. Left ventricular ejection fraction by 3D volume is 47 %. The left  ventricle has mildly decreased function. The left ventricle demonstrates  regional wall motion  abnormalities. The left ventricular internal cavity size was normal in  size. There is mild asymmetric left ventricular hypertrophy of the  basal-septal segment. Left ventricular diastolic parameters were normal.     LV Wall Scoring:  The entire inferior wall and posterior wall are hypokinetic.   ASSESSMENT:    1. Ischemic cardiomyopathy: Improved, EF now 45 to 50% on March 26, 2021 echo   2. Aneurysm of ascending aorta without rupture (HCC)   3. STEMI involving right coronary artery (HCC):  01/17/2021 with the mid and proximal RCA   4. Atherosclerosis of native coronary artery of native heart without angina pectoris   5. Essential hypertension   6. Hyperlipidemia with target LDL less than 70   7. Lung nodule   8. History of gastroesophageal reflux (GERD)     PLAN:  Mr. Curlee "Mellody Dance "Hulet is a 77 year old gentleman who has a history of hypertension, hyperlipidemia, spinal stenosis, and GERD.  He has been active for most of his life and swims almost daily and was playing golf several days per week.  There is no tobacco history or alcohol use.  Recently he had been more aggressive with dietary change and had not been eating any meat since early July 2022.  He presented with an acute inferior wall STEMI on January 17, 2021 and was found to have mid-distal thrombotic occlusion of a large dominant RCA.  He underwent difficult but successful coronary intervention to his calcified tortuous vessel with ultimate stenting with a 3.5 x 15 mm Medtronic Onyx frontiers stent postdilated to 3.8 mm at the occlusion site with resumption of brisk TIMI-3 flow and due to proximal 80% stenosis underwent stenting with a 3.5 x 18 mm stent postdilated to 3.85 mm.  His initial EF the following day showed reduced LV function with an EF at approximately 35 to 40%.  Of note, he also had moderate dilatation of  his ascending aorta at 45 mm.  He had an uneventful postoperative course and was discharged 2 days following his MI.  When I saw him for his initial post hospital office evaluation in August 2022 with his reduced LV function, I recommended titration of losartan to 25 mg twice a day from its present dose of 25 mg daily.  He was on metoprolol succinate 25 mg daily.  He has continued to feel well.  His subsequent echo Doppler study from March 26, 2021 demonstrated improved LV function with EF now at 45 to 50%.  At that time with class I symptoms rather than initiate Entresto I further titrated losartan to 50 mg in  the morning and 25 mg in the evening for more optimal blood pressure control.  He has continued to be on metoprolol succinate.  Due to concerns of possible progressive ascending aortic dilatation at 49 mm on the October 2022 echo, he was referred for CT angio of his chest and aorta  on May 16, 2021.  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  Incidentally he was found to have an 8 mm right lower lobe pulmonary nodule and noncontrast chest CT has been recommended in 6 to 12 months.  He has undergone subsequent imaging and has established with Dr. Evelene Croon to follow his aortic root dilation.  His most recent CT angio of his chest and aorta showed progressive aortic root dilatation of 4.7 with no evidence for thoracic aortic aneurysm elsewhere or dissection.  Presently, his blood pressure remained stable and on repeat today by me was 124/80 on his regimen of losartan 50 mg in the morning and 75 mg in the evening in addition to metoprolol 25 mg daily.  His ECG remained stable.  He continues to be on rosuvastatin 40 mg and omega-3 fatty acid for lipid management.  He has been maintained on DAPT with aspirin/low-dose Brilinta.  It is now over 2 years since his large inferior STEMI.  He remains asymptomatic without angina but has bruisability.  I have recommended he can discontinue Brilinta and in its place we will initiate clopidogrel 75 mg.  If he continues to have easy bruisability aspirin can be discontinued and he should be maintained on clopidogrel 75 mg daily.  He will be undergoing repeat evaluation with Dr. Laneta Simmers in January 2025 following repeat CT evaluation of his chest and aorta for evaluation of his aortic root dilatation as well as pulmonary nodules.  Will see him in April/May 2025 for follow-up evaluation or sooner as needed.   Medication Adjustments/Labs and Tests  Ordered: Current medicines are reviewed at length with the patient today.  Concerns regarding medicines are outlined above.  Medication changes, Labs and Tests ordered today are listed in the Patient Instructions below. Patient Instructions  Medication Instructions:  After you've completed the Brilinta 60mg , you will begin Plavix 75mg  daily *If you need a refill on your cardiac medications before your next appointment, please call your pharmacy*   Lab Work: No labs ordered If you have labs (blood work) drawn today and your tests are completely normal, you will receive your results only by: MyChart Message (if you have MyChart) OR A paper copy in the mail If you have any lab test that is abnormal or we need to change your treatment, we will call you to review the results.  Testing/Procedures: No testing   Follow-Up: At [facility], you and your health needs are our priority.  As part of our continuing mission to provide you with exceptional heart care, we have created designated Provider Care Teams.  These Care Teams include your primary Cardiologist (physician) and Advanced Practice Providers (APPs -  Physician Assistants and Nurse Practitioners) who all work together to provide you with the care you need, when you need it.  We recommend signing up for the patient portal called "MyChart".  Sign up information is provided on this After Visit Summary.  MyChart is used to connect with patients for Virtual Visits (Telemedicine).  Patients are able to view lab/test results, encounter notes, upcoming appointments, etc.  Non-urgent messages can be sent to your provider as well.   To learn more about what you can do with MyChart, go to [email or web address].    Your next appointment:   6 month(s)  Provider:   Nicki Guadalajara, MD       Signed, Nicki Guadalajara, MD  05/05/2023 6:52 PM    [facility] [address], [address], [place], [place]  [address] Phone:  [phone]

## 2023-04-29 NOTE — Patient Instructions (Addendum)
Medication Instructions:  After you've completed the Brilinta 60mg , you will begin Plavix 75mg  daily *If you need a refill on your cardiac medications before your next appointment, please call your pharmacy*   Lab Work: No labs ordered If you have labs (blood work) drawn today and your tests are completely normal, you will receive your results only by: MyChart Message (if you have MyChart) OR A paper copy in the mail If you have any lab test that is abnormal or we need to change your treatment, we will call you to review the results.   Testing/Procedures: No testing   Follow-Up: At [facility], you and your health needs are our priority.  As part of our continuing mission to provide you with exceptional heart care, we have created designated Provider Care Teams.  These Care Teams include your primary Cardiologist (physician) and Advanced Practice Providers (APPs -  Physician Assistants and Nurse Practitioners) who all work together to provide you with the care you need, when you need it.  We recommend signing up for the patient portal called "MyChart".  Sign up information is provided on this After Visit Summary.  MyChart is used to connect with patients for Virtual Visits (Telemedicine).  Patients are able to view lab/test results, encounter notes, upcoming appointments, etc.  Non-urgent messages can be sent to your provider as well.   To learn more about what you can do with MyChart, go to [email or web address].    Your next appointment:   6 month(s)  Provider:   Nicki Guadalajara, MD

## 2023-05-05 ENCOUNTER — Encounter: Payer: Self-pay | Admitting: Cardiovascular Disease

## 2023-05-07 ENCOUNTER — Telehealth: Payer: Self-pay | Admitting: Cardiovascular Disease

## 2023-05-07 NOTE — Telephone Encounter (Signed)
Patient wants a call back directly from Dr. Landry Dyke RN.

## 2023-05-26 ENCOUNTER — Other Ambulatory Visit: Payer: Self-pay | Admitting: Surgery

## 2023-05-26 DIAGNOSIS — I7121 Aneurysm of the ascending aorta, without rupture: Secondary | ICD-10-CM

## 2023-06-24 ENCOUNTER — Ambulatory Visit
Admission: RE | Admit: 2023-06-24 | Discharge: 2023-06-24 | Disposition: A | Discharge status: Home / Self Care | Payer: Medicare Other | Source: Ambulatory Visit | Attending: Surgery | Admitting: Surgery

## 2023-06-24 DIAGNOSIS — I7121 Aneurysm of the ascending aorta, without rupture: Secondary | ICD-10-CM

## 2023-06-24 DIAGNOSIS — I700 Atherosclerosis of aorta: Secondary | ICD-10-CM | POA: Diagnosis not present

## 2023-06-24 DIAGNOSIS — I712 Thoracic aortic aneurysm, without rupture, unspecified: Secondary | ICD-10-CM | POA: Diagnosis not present

## 2023-06-24 MED ORDER — IOPAMIDOL (ISOVUE-370) INJECTION 76%
75.0000 mL | Freq: Once | INTRAVENOUS | Status: AC | PRN
  Administered 2023-06-24: 75 mL via INTRAVENOUS

## 2023-07-01 ENCOUNTER — Ambulatory Visit: Payer: Medicare Other | Admitting: Surgery

## 2023-07-01 VITALS — BP 125/87 | HR 80 | Resp 20 | Ht 75.0 in | Wt 208.0 lb

## 2023-07-01 DIAGNOSIS — I7121 Aneurysm of the ascending aorta, without rupture: Secondary | ICD-10-CM

## 2023-07-01 NOTE — Progress Notes (Signed)
 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  He has a history of hypertension, hyperlipidemia, and coronary artery disease status post inferior STEMI and August 2022 treated with PCI.  His most recent echo on 04/23/2023 showed a left ventricular ejection fraction of 45 to 50% which was unchanged.  The aortic valve is trileaflet with trivial regurgitation.  The aortic root diameter was measured at 4.6 cm.  CTA of the chest on 06/24/2023 showed a stable aortic root diameter at the sinus level of 4.7 cm.  The ascending aorta measured 3.8 cm.  He continues to feel well overall and swims almost every day.  He denies any chest pain or shortness of breath.  Current Outpatient Medications  Medication Sig Dispense Refill   acetaminophen  (TYLENOL ) 500 MG tablet Take 500 mg by mouth daily as needed for moderate pain.     aspirin  81 MG chewable tablet Chew 1 tablet (81 mg total) by mouth daily.     calcium  carbonate (TUMS - DOSED IN MG ELEMENTAL CALCIUM ) 500 MG chewable tablet Chew 2 tablets by mouth daily as needed for indigestion or heartburn.     Cholecalciferol (VITAMIN D3) 2000 units TABS Take 2,000 Units by mouth daily.     clopidogrel  (PLAVIX ) 75 MG tablet Take 1 tablet (75 mg total) by mouth daily. 60 tablet 3   gabapentin  (NEURONTIN ) 100 MG capsule TAKE TWO CAPSULES BY MOUTH AT BEDTIME 180 capsule 1   losartan  (COZAAR ) 50 MG tablet TAKE ONE TABLET EVERY MORNING AND TAKE 1/2 TABLET EVERY EVENING 270 tablet 3   MAGNESIUM  CITRATE PO Take by mouth daily.     Menthol , Topical Analgesic, (BIOFREEZE) 4 % GEL Apply 1 application topically daily as needed (pain).     metoprolol  succinate (TOPROL -XL) 25 MG 24 hr tablet TAKE ONE TABLET BY MOUTH DAILY 90 tablet 2   Multiple Vitamins-Minerals (HAIR SKIN AND NAILS FORMULA PO) Take 1 tablet by mouth daily.     nitroGLYCERIN  (NITROSTAT ) 0.4 MG SL tablet Place 1 tablet (0.4 mg total) under the tongue  every 5 (five) minutes as needed for chest pain. 25 tablet 3   Omega-3 Fatty Acids (FISH OIL ) 1200 MG CAPS Take 1 capsule (1,200 mg total) by mouth daily. 30 capsule 1   rosuvastatin  (CRESTOR ) 40 MG tablet TAKE ONE TABLET BY MOUTH DAILY 90 tablet 1   tiZANidine  (ZANAFLEX ) 4 MG tablet Take 1 tablet (4 mg total) by mouth every 8 (eight) hours as needed for muscle spasms. 90 tablet 0   traMADol  (ULTRAM ) 50 MG tablet Take 1 tablet (50 mg total) by mouth every 6 (six) hours as needed. 120 tablet 2   vitamin B-12 (CYANOCOBALAMIN ) 1000 MCG tablet Take 1,000 mcg by mouth daily.     vitamin C (ASCORBIC ACID) 250 MG tablet Take 250 mg by mouth daily.     ticagrelor  (BRILINTA ) 60 MG TABS tablet Take 1 tablet (60 mg total) by mouth 2 (two) times daily. (Patient not taking: Reported on 07/01/2023) 180 tablet 1   No current facility-administered medications for this visit.     Physical Exam: BP 125/87   Pulse 80   Resp 20   Ht 6\' 3"  (1.905 m)   Wt 208 lb (94.3 kg)   SpO2 97% Comment: RA  BMI 26.00 kg/m  He looks well. Cardiac exam shows regular rate and rhythm with normal heart sounds.  There is no murmur. Lungs are clear.  Diagnostic Tests:  Narrative & Impression  CLINICAL DATA:  Follow-up thoracic aortic aneurysm.   EXAM: CT ANGIOGRAPHY CHEST WITH CONTRAST   TECHNIQUE: Multidetector CT imaging of the chest was performed using the standard protocol during bolus administration of intravenous contrast. Multiplanar CT image reconstructions and MIPs were obtained to evaluate the vascular anatomy.   RADIATION DOSE REDUCTION: This exam was performed according to the departmental dose-optimization program which includes automated exposure control, adjustment of the mA and/or kV according to patient size and/or use of iterative reconstruction technique.   CONTRAST:  75mL ISOVUE -370 IOPAMIDOL  (ISOVUE -370) INJECTION 76%   COMPARISON:  Chest CT dated 06/19/2022.    FINDINGS: Cardiovascular: There is no cardiomegaly or pericardial effusion. There is coronary vascular calcification. Mild atherosclerotic calcification of the thoracic aorta. There is similar appearance of mildly dilated sinus of Valsalva measure up to 4.7 cm as seen on the prior CT. No aortic dissection. The origins of the great vessels of the aortic arch and the central pulmonary arteries appear patent.   Mediastinum/Nodes: No hilar or mediastinal adenopathy. The esophagus is grossly unremarkable. No mediastinal fluid collection.   Lungs/Pleura: No focal consolidation, pleural effusion, or pneumothorax. The central airways are patent.   Upper Abdomen: Right renal cysts.   Musculoskeletal: Osteopenia with degenerative changes of the spine. No acute osseous pathology.   Review of the MIP images confirms the above findings.   IMPRESSION: 1. Similar appearance of mildly dilated sinus of Valsalva measure up to 4.7 cm. The remainder of the thoracic aorta is unremarkable. No aortic dissection. 2. No acute intrathoracic pathology. 3.  Aortic Atherosclerosis (ICD10-I70.0).     Electronically Signed   By: Angus Bark M.D.   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  This is well below the surgical threshold of 5.5 cm. I reviewed the CT images with him and answered all of his questions.  I stressed the importance of continued good blood pressure control in preventing further enlargement and acute aortic dissection.  I advised him against doing any heavy lifting that may require a Valsalva maneuver and could suddenly raise his blood pressure to high levels.   Plan:  I will see him back in 1 year with a CTA of the chest for aortic surveillance.  I spent 15 minutes performing this established patient evaluation and > 50% of this time was spent face to face counseling and coordinating  the care of this patient's aortic aneurysm.    Bartley Lightning, MD [facility] [phone]

## 2023-07-02 ENCOUNTER — Other Ambulatory Visit: Payer: Self-pay | Admitting: Cardiovascular Disease

## 2023-07-14 ENCOUNTER — Ambulatory Visit (INDEPENDENT_AMBULATORY_CARE_PROVIDER_SITE_OTHER): Payer: Medicare Other | Admitting: Family Medicine

## 2023-07-14 ENCOUNTER — Telehealth: Payer: Self-pay

## 2023-07-14 ENCOUNTER — Encounter: Payer: Self-pay | Admitting: Family Medicine

## 2023-07-14 VITALS — BP 118/78 | HR 53 | Temp 98.3°F | Ht 75.0 in | Wt 208.6 lb

## 2023-07-14 DIAGNOSIS — R739 Hyperglycemia, unspecified: Secondary | ICD-10-CM

## 2023-07-14 DIAGNOSIS — M48062 Spinal stenosis, lumbar region with neurogenic claudication: Secondary | ICD-10-CM | POA: Diagnosis not present

## 2023-07-14 DIAGNOSIS — I10 Essential (primary) hypertension: Secondary | ICD-10-CM | POA: Diagnosis not present

## 2023-07-14 LAB — POCT GLYCOSYLATED HEMOGLOBIN (HGB A1C): Hemoglobin A1C: 5.7 % — AB (ref 4.0–5.6)

## 2023-07-14 NOTE — Assessment & Plan Note (Signed)
Current hypertension medications:       Sig   losartan (COZAAR) 50 MG tablet (Taking) TAKE ONE TABLET EVERY MORNING AND TAKE 1/2 TABLET EVERY EVENING   metoprolol succinate (TOPROL-XL) 25 MG 24 hr tablet (Taking) TAKE ONE TABLET BY MOUTH DAILY       Blood pressure currently stable on the above medications. He is engaging in regular physical activity (swimming, golf, going to the gym) and watches his salt intake carefully at home. He is compliant with medications. Continue current medications. Follow up every 6 months.

## 2023-07-14 NOTE — Telephone Encounter (Signed)
Copied from CRM [id number]. Topic: General - Other >> Jul 14, 2023  3:40 PM Corin V wrote: Reason for CRM: Patient called to advise that he got the flu shot and Covid shot on 03/17/23.

## 2023-07-14 NOTE — Progress Notes (Signed)
Established Patient Office Visit  Subjective   Patient ID: Bryan [name], male    DOB: 09/14/1945  Age: 78 y.o. MRN: [id number]  Chief Complaint  Patient presents with   Medical Management of Chronic Issues    Pt is here for follow up on his HTN. He reports that he saw Dr. Laneta Simmers who is a CT surgeon about his Ascending aortic aneurysm, states that his CT showed stable measurements at 4.7 cm, is just watching it yearly for now.   HTN -- BP in office performed and is well controlled. He  reports no side effects to the medications, no chest pain, SOB, dizziness or headaches. He has a BP cuff at home and is checking BP regularly, reports they are in the normal range.   Lumbar radiculopathy -- pt continues on tramadol 50 mg 2-3 times per day and gabapentin 200 mg at bedtime. He reports good control of his pain levels with this medication, reports that he does not currently need refills at this time. Denies any side effects to the medication.     Current Outpatient Medications  Medication Instructions   acetaminophen (TYLENOL) 500 mg, Daily PRN   aspirin 81 mg, Oral, Daily   calcium carbonate (TUMS - DOSED IN MG ELEMENTAL CALCIUM) 500 MG chewable tablet 2 tablets, Daily PRN   clopidogrel (PLAVIX) 75 mg, Oral, Daily   cyanocobalamin (VITAMIN B12) 1,000 mcg, Daily   Fish Oil 1,200 mg, Oral, Daily   gabapentin (NEURONTIN) 100 MG capsule TAKE TWO CAPSULES BY MOUTH AT BEDTIME   losartan (COZAAR) 50 MG tablet TAKE ONE TABLET EVERY MORNING AND TAKE 1/2 TABLET EVERY EVENING   MAGNESIUM CITRATE PO Daily   Menthol, Topical Analgesic, (BIOFREEZE) 4 % GEL 1 application , Daily PRN   metoprolol succinate (TOPROL-XL) 25 mg, Oral, Daily   Multiple Vitamins-Minerals (HAIR SKIN AND NAILS FORMULA PO) 1 tablet, Daily   nitroGLYCERIN (NITROSTAT) 0.4 mg, Sublingual, Every 5 min PRN   rosuvastatin (CRESTOR) 40 mg, Oral, Daily   tiZANidine (ZANAFLEX) 4 mg, Oral, Every 8 hours PRN   traMADol (ULTRAM) 50  mg, Oral, Every 6 hours PRN   vitamin C (ASCORBIC ACID) 250 mg, Daily   Vitamin D3 2,000 Units, Daily    Patient Active Problem List   Diagnosis Date Noted   Coronary atherosclerosis of native coronary artery 01/03/2022    Priority: High   Chronic systolic congestive heart failure (HCC) 01/06/2022   STEMI involving right coronary artery (HCC) 01/17/2021   ST elevation myocardial infarction involving right coronary artery (HCC)    Right groin pain 09/07/2019   S/P right THA, AA 02/09/2018   S/P hip replacement 02/09/2018   Osteoarthritis of right hip joint due to dysplasia 01/04/2018   Lumbar stenosis with neurogenic claudication 11/11/2017   PROSTATE SPECIFIC ANTIGEN, ELEVATED 07/19/2009   Raised prostate specific antigen 07/19/2009   Backache 06/11/2007   Essential hypertension 05/21/2007   Osteoarthritis 05/21/2007      Review of Systems  All other systems reviewed and are negative.     Objective:     BP 118/78   Pulse (!) 53   Temp 98.3 F (36.8 C) (Oral)   Ht 6\' 3"  (1.905 m)   Wt 208 lb 9.6 oz (94.6 kg)   SpO2 98%   BMI 26.07 kg/m  BP Readings from Last 3 Encounters:  07/14/23 118/78  07/01/23 125/87  04/29/23 136/80      Physical Exam Vitals reviewed.  Constitutional:  Appearance: Normal appearance. He is normal weight.  Cardiovascular:     Rate and Rhythm: Normal rate and regular rhythm.     Heart sounds: Normal heart sounds. No murmur heard. Pulmonary:     Effort: Pulmonary effort is normal.     Breath sounds: Normal breath sounds. No wheezing or rales.  Musculoskeletal:     Right lower leg: No edema.     Left lower leg: No edema.  Neurological:     Mental Status: He is alert and oriented to person, place, and time. Mental status is at baseline.  Psychiatric:        Mood and Affect: Mood normal.        Behavior: Behavior normal.      Results for orders placed or performed in visit on 07/14/23  POC HgB A1c  Result Value Ref Range    Hemoglobin A1C 5.7 (A) 4.0 - 5.6 %   HbA1c POC (<> result, manual entry)     HbA1c, POC (prediabetic range)     HbA1c, POC (controlled diabetic range)        The ASCVD Risk score (Arnett DK, et al., 2019) failed to calculate for the following reasons:   Risk score cannot be calculated because patient has a medical history suggesting prior/existing ASCVD    Assessment & Plan:  Hyperglycemia Last A1C was 5.9, today it is 5.7 which is improved, I encouraged the patient to watch carb intake and continue exercise. RTC in 6 months for annual physical. -     POCT glycosylated hemoglobin (Hb A1C)  Essential hypertension Assessment & Plan: Current hypertension medications:       Sig   losartan (COZAAR) 50 MG tablet (Taking) TAKE ONE TABLET EVERY MORNING AND TAKE 1/2 TABLET EVERY EVENING   metoprolol succinate (TOPROL-XL) 25 MG 24 hr tablet (Taking) TAKE ONE TABLET BY MOUTH DAILY      Blood pressure currently stable on the above medications. He is engaging in regular physical activity (swimming, golf, going to the gym) and watches his salt intake carefully at home. He is compliant with medications. Continue current medications. Follow up every 6 months.    Lumbar stenosis with neurogenic claudication Assessment & Plan: Chronic, pain level is stable, controlled. He is taking tramadol every 6 hours and gabapentin at bedtime. He cannot take NSAIDS due to hx of CAD/ AMI. Continue regimen described above as prescribed.       Return in about 6 months (around 01/11/2024) for annual physical exam-- come fasting for labs .    Karie Georges, MD

## 2023-07-14 NOTE — Assessment & Plan Note (Signed)
Chronic, pain level is stable, controlled. He is taking tramadol every 6 hours and gabapentin at bedtime. He cannot take NSAIDS due to hx of CAD/ AMI.

## 2023-07-14 NOTE — Telephone Encounter (Signed)
Vaccine information updated as below.

## 2023-08-07 DIAGNOSIS — R350 Frequency of micturition: Secondary | ICD-10-CM | POA: Diagnosis not present

## 2023-08-12 ENCOUNTER — Other Ambulatory Visit: Payer: Self-pay | Admitting: Family Medicine

## 2023-08-12 DIAGNOSIS — M48062 Spinal stenosis, lumbar region with neurogenic claudication: Secondary | ICD-10-CM

## 2023-08-17 ENCOUNTER — Other Ambulatory Visit: Payer: Self-pay | Admitting: Family Medicine

## 2023-08-18 DIAGNOSIS — C44519 Basal cell carcinoma of skin of other part of trunk: Secondary | ICD-10-CM | POA: Diagnosis not present

## 2023-08-18 DIAGNOSIS — L570 Actinic keratosis: Secondary | ICD-10-CM | POA: Diagnosis not present

## 2023-09-16 ENCOUNTER — Ambulatory Visit: Payer: Self-pay

## 2023-09-16 ENCOUNTER — Ambulatory Visit: Admitting: Sports Medicine

## 2023-09-16 ENCOUNTER — Ambulatory Visit (INDEPENDENT_AMBULATORY_CARE_PROVIDER_SITE_OTHER)

## 2023-09-16 VITALS — BP 140/70 | HR 65 | Ht 75.0 in | Wt 211.6 lb

## 2023-09-16 DIAGNOSIS — S46212A Strain of muscle, fascia and tendon of other parts of biceps, left arm, initial encounter: Secondary | ICD-10-CM | POA: Diagnosis not present

## 2023-09-16 DIAGNOSIS — M25512 Pain in left shoulder: Secondary | ICD-10-CM | POA: Diagnosis not present

## 2023-09-16 DIAGNOSIS — M19012 Primary osteoarthritis, left shoulder: Secondary | ICD-10-CM | POA: Diagnosis not present

## 2023-09-16 NOTE — Progress Notes (Signed)
 [name] [address] [place] [address] Phone: [phone]   Assessment and Plan:    1. Acute pain of left shoulder 2. Tear of left biceps muscle, initial encounter  -Acute, initial visit - Consistent with small partial tearing of left biceps musculature based on physical exam, HPI, ultrasound.  Suspect disruption of ~10% of muscle fibers based on ultrasound - Patient has full strength, range of motion, without pain on today's physical exam, so may return to physical activity as tolerated - May use Tylenol for day-to-day pain relief  Sports Medicine: Musculoskeletal Ultrasound. Exam: Limited US of left biceps Diagnosis: Bruising along left bicep  US Findings: Disruption of ~muscle fibers of left bicep.  No edema or tearing present in biceps tendon  US Impression:  Small, ~10%, partial biceps muscle tear  Pertinent previous records reviewed include none  Follow Up: As needed if no improvement or worsening of symptoms.  Could follow-up to further discuss chronic hand pain   Subjective:   I, Bryan [name] am a scribe for Dr. Jean [name].   Chief Complaint: left shoulder pain   HPI:   09/16/23 Patient is a 78 year old male with left shoulder pain. Patient states Sunday after gym went to turn the shower off and there was a sharp pain in his bicep. Here for the small bump. He states that he has no issues with ROM but it is still sore.   Duration? Since Sunday Did you have an Injury to cause this pain? no Taking Medication for pain? Tramadol every 6 hours, tylenol, 2 gabapentin at night (from back pain) Numbness or Tingling? no Does the pain Radiate? no Altered gait or use? no ROM/ impairment of movement? no   Relevant Historical Information: History of STEMI, hypertension, CAD  Additional pertinent review of systems negative.   Current Outpatient Medications:    acetaminophen (TYLENOL) 500 MG tablet, Take 500  mg by mouth daily as needed for moderate pain., Disp: , Rfl:    aspirin 81 MG chewable tablet, Chew 1 tablet (81 mg total) by mouth daily., Disp: , Rfl:    calcium carbonate (TUMS - DOSED IN MG ELEMENTAL CALCIUM) 500 MG chewable tablet, Chew 2 tablets by mouth daily as needed for indigestion or heartburn., Disp: , Rfl:    Cholecalciferol (VITAMIN D3) 2000 units TABS, Take 2,000 Units by mouth daily., Disp: , Rfl:    clopidogrel (PLAVIX) 75 MG tablet, Take 1 tablet (75 mg total) by mouth daily., Disp: 60 tablet, Rfl: 3   gabapentin (NEURONTIN) 100 MG capsule, TAKE TWO CAPSULES BY MOUTH AT BEDTIME, Disp: 180 capsule, Rfl: 1   losartan (COZAAR) 50 MG tablet, TAKE ONE TABLET EVERY MORNING AND TAKE 1/2 TABLET EVERY EVENING, Disp: 135 tablet, Rfl: 3   MAGNESIUM CITRATE PO, Take by mouth daily., Disp: , Rfl:    Menthol, Topical Analgesic, (BIOFREEZE) 4 % GEL, Apply 1 application topically daily as needed (pain)., Disp: , Rfl:    metoprolol succinate (TOPROL-XL) 25 MG 24 hr tablet, TAKE ONE TABLET BY MOUTH DAILY, Disp: 90 tablet, Rfl: 2   Multiple Vitamins-Minerals (HAIR SKIN AND NAILS FORMULA PO), Take 1 tablet by mouth daily., Disp: , Rfl:    nitroGLYCERIN (NITROSTAT) 0.4 MG SL tablet, Place 1 tablet (0.4 mg total) under the tongue every 5 (five) minutes as needed for chest pain., Disp: 25 tablet, Rfl: 3   Omega-3 Fatty Acids (FISH OIL) 1200 MG CAPS, Take 1 capsule (1,200 mg  total) by mouth daily., Disp: 30 capsule, Rfl: 1   rosuvastatin (CRESTOR) 40 MG tablet, TAKE ONE TABLET BY MOUTH DAILY, Disp: 90 tablet, Rfl: 1   tiZANidine (ZANAFLEX) 4 MG tablet, Take 1 tablet (4 mg total) by mouth every 8 (eight) hours as needed for muscle spasms., Disp: 90 tablet, Rfl: 0   traMADol (ULTRAM) 50 MG tablet, TAKE ONE TABLET BY MOUTH EVERY 6 HOURS AS NEEDED, Disp: 120 tablet, Rfl: 0   vitamin B-12 (CYANOCOBALAMIN) 1000 MCG tablet, Take 1,000 mcg by mouth daily., Disp: , Rfl:    vitamin C (ASCORBIC ACID) 250 MG tablet,  Take 250 mg by mouth daily., Disp: , Rfl:    Objective:     Vitals:   09/16/23 1407  BP: (!) 140/70  Pulse: 65  SpO2: 98%  Weight: 211 lb 9.6 oz (96 kg)  Height: 6\' 3"  (1.905 m)      Body mass index is 26.45 kg/m.    Physical Exam:    Gen: Appears well, nad, nontoxic and pleasant Neuro:sensation intact, strength is 5/5 with df/pf/inv/ev, muscle tone wnl Skin: no suspicious lesion or defmority Psych: A&O, appropriate mood and affect  Left shoulder:  Small knot superficial to biceps musculature with surrounding ecchymosis.  No Popeye's deformity No scapular winging FF 180, abd 180, int 0, ext 90 NTTP over the [place], clavicle, ac, coracoid, biceps groove, humerus, deltoid, trapezius, cervical spine Neg neer, hawkins, empty can, obriens, crossarm, subscap liftoff, speeds Neg ant drawer, sulcus sign, apprehension Negative Spurling's test bilat FROM of neck    Electronically signed by:  [name] 2:40 PM 09/16/23

## 2023-09-22 ENCOUNTER — Other Ambulatory Visit: Payer: Self-pay | Admitting: Family Medicine

## 2023-09-22 DIAGNOSIS — M48062 Spinal stenosis, lumbar region with neurogenic claudication: Secondary | ICD-10-CM

## 2023-10-06 ENCOUNTER — Other Ambulatory Visit: Payer: Self-pay | Admitting: Cardiovascular Disease

## 2023-10-11 ENCOUNTER — Other Ambulatory Visit: Payer: Self-pay | Admitting: Physician Assistant

## 2023-10-28 ENCOUNTER — Ambulatory Visit: Payer: Medicare Other | Attending: Cardiovascular Disease | Admitting: Cardiovascular Disease

## 2023-10-28 ENCOUNTER — Encounter: Payer: Self-pay | Admitting: Cardiovascular Disease

## 2023-10-28 VITALS — BP 122/70 | HR 65 | Ht 75.0 in | Wt 211.0 lb

## 2023-10-28 DIAGNOSIS — I2510 Atherosclerotic heart disease of native coronary artery without angina pectoris: Secondary | ICD-10-CM

## 2023-10-28 DIAGNOSIS — Z8719 Personal history of other diseases of the digestive system: Secondary | ICD-10-CM | POA: Diagnosis not present

## 2023-10-28 DIAGNOSIS — E785 Hyperlipidemia, unspecified: Secondary | ICD-10-CM

## 2023-10-28 DIAGNOSIS — I10 Essential (primary) hypertension: Secondary | ICD-10-CM | POA: Diagnosis not present

## 2023-10-28 DIAGNOSIS — I2111 ST elevation (STEMI) myocardial infarction involving right coronary artery: Secondary | ICD-10-CM

## 2023-10-28 DIAGNOSIS — I493 Ventricular premature depolarization: Secondary | ICD-10-CM

## 2023-10-28 DIAGNOSIS — I440 Atrioventricular block, first degree: Secondary | ICD-10-CM

## 2023-10-28 DIAGNOSIS — I7121 Aneurysm of the ascending aorta, without rupture: Secondary | ICD-10-CM | POA: Diagnosis not present

## 2023-10-28 DIAGNOSIS — R911 Solitary pulmonary nodule: Secondary | ICD-10-CM

## 2023-10-28 NOTE — Patient Instructions (Signed)
 Medication Instructions:  NO CHANGES *If you need a refill on your cardiac medications before your next appointment, please call your pharmacy*  Lab Work: NO LABS If you have labs (blood work) drawn today and your tests are completely normal, you will receive your results only by: MyChart Message (if you have MyChart) OR A paper copy in the mail If you have any lab test that is abnormal or we need to change your treatment, we will call you to review the results.  Testing/Procedures: NO TESTING  Follow-Up: At [facility], you and your health needs are our priority.  As part of our continuing mission to provide you with exceptional heart care, our providers are all part of one team.  This team includes your primary Cardiologist (physician) and Advanced Practice Providers or APPs (Physician Assistants and Nurse Practitioners) who all work together to provide you with the care you need, when you need it.  Your next appointment:   6-8 month(s)  Provider:   Randene Bustard, MD

## 2023-10-28 NOTE — Progress Notes (Unsigned)
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 sinus of Valsalva measuring 45 mm.   Sinotubular junction 33 mm   Ascending thoracic aorta 36 mm   Aortic arch 30 mm   Descending thoracic aorta 30 mm.   No mediastinal hemorrhage or hematoma. Limited assessment of the pulmonary arteries. Normal heart size. No pericardial effusion. Native coronary atherosclerosis.   Central venous structures are patent. No veno-occlusive process or anomalous pulmonary venous return.   Mediastinum/Nodes: No enlarged mediastinal, hilar, or axillary lymph nodes. Thyroid  gland, trachea, and esophagus demonstrate no significant findings.   Lungs/Pleura: Right lower lobe peripheral 8 mm nodule, image 101/11.   No acute airspace process, collapse or consolidation. No interstitial disease or edema. Trachea and central airways are patent. No pleural abnormality, effusion, or pneumothorax.   Upper Abdomen: No acute upper abdominal finding. Right upper pole renal cyst measures 5.5 cm.   Musculoskeletal: Fusion hardware noted of the upper lumbar spine. Degenerative changes  present. No acute osseous finding.   Review of the MIP images confirms the above findings.   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 Remainder of the thoracic aorta is normal in caliber.   No other acute intrathoracic process.   8 mm right lower lobe pulmonary nodule. Non-contrast chest CT at 6-12 months is recommended. If the nodule is stable at time of repeat CT, then future CT at 18-24 months (from today's scan) is considered optional for low-risk patients, but is recommended for high-risk patients. This recommendation follows the consensus statement: Guidelines for Management of Incidental Pulmonary Nodules Detected on CT Images: From the Fleischner Society 2017; Radiology 2017; 284:228-243.   Native coronary atherosclerosis   Aortic Atherosclerosis (ICD10-I70.0).   CT ANGI CHEST/AORTA: 06/20/2022 FINDINGS: Cardiovascular: Aortic root is dilated at 4.7 cm. There is no evidence of thoracic aortic aneurysm elsewhere or dissection. Great vessels are widely patent. Normal cardiac size. No pericardial effusion. Coronary artery calcifications are noted.  Mediastinum/Nodes: No enlarged mediastinal, hilar, or axillary lymph nodes. Thyroid  gland, trachea, and esophagus demonstrate no significant findings.   Lungs/Pleura: No pneumothorax or pleural effusion is noted. Grossly stable 7 mm nodule is noted in right lower lobe best seen on image number 98 of series 7.   Upper Abdomen: No acute abnormality.   Musculoskeletal: No chest wall abnormality. No acute or significant osseous findings.   Review of the MIP images confirms the above findings.   IMPRESSION: Aortic root dilatation is noted at 4.7 cm which is slightly enlarged compared to prior exam. There is no other evidence of thoracic aortic aneurysm or dissection.   Coronary artery calcifications are noted suggesting coronary artery disease.   Grossly stable 7 mm  nodule seen in right lower lobe. Follow-up unenhanced chest CT in 12 months is recommended to ensure stability.   Aortic Atherosclerosis (ICD10-I70.0).    ECHO: 04/23/2023  1. Left ventricular ejection fraction, by estimation, is 45 to 50%. Left  ventricular ejection fraction by 3D volume is 47 %. The left ventricle has  mildly decreased function. The left ventricle demonstrates regional wall  motion abnormalities (see  scoring diagram/findings for description). There is mild asymmetric left  ventricular hypertrophy of the basal-septal segment. Left ventricular  diastolic parameters were normal.   2. Right ventricular systolic function is normal. The right ventricular  size is normal. There is normal pulmonary artery systolic pressure. The  estimated right ventricular systolic pressure is 15.2 mmHg.   3. The mitral valve is abnormal. Trivial mitral valve regurgitation.   4. The aortic valve is tricuspid. Aortic valve regurgitation is trivial.   5. Aortic dilatation noted. There is moderate dilatation of the aortic  root, measuring 46 mm. There is moderate dilatation of the ascending  aorta, measuring 49 mm.   6. The inferior vena cava is normal in size with greater than 50%  respiratory variability, suggesting right atrial pressure of 3 mmHg.   Comparison(s): No significant change from prior study. 03/26/2021: LVEF  45-50%, inferior/inferoseptal hypokinesis.   FINDINGS   Left Ventricle: Left ventricular ejection fraction, by estimation, is 45  to 50%. Left ventricular ejection fraction by 3D volume is 47 %. The left  ventricle has mildly decreased function. The left ventricle demonstrates  regional wall motion  abnormalities. The left ventricular internal cavity size was normal in  size. There is mild asymmetric left ventricular hypertrophy of the  basal-septal segment. Left ventricular diastolic parameters were normal.     LV Wall Scoring:  The entire inferior wall and  posterior wall are hypokinetic.    CT ANGIO CHEST/AORTA: 06/24/2023 IMPRESSION: 1. Similar appearance of mildly dilated sinus of Valsalva measure up to 4.7 cm. The remainder of the thoracic aorta is unremarkable. No aortic dissection. 2. No acute intrathoracic pathology. 3.  Aortic Atherosclerosis (ICD10-I70.0).  ASSESSMENT:    1. STEMI involving right coronary artery (HCC): 01/17/2021 with PCI mid and proximal RCA   2. Atherosclerosis of native coronary artery of native heart without angina pectoris   3. Essential hypertension   4. Hyperlipidemia with target LDL less than 70   5. First degree heart block   6. PVC (premature ventricular contraction)   7. Aneurysm of ascending aorta without rupture (HCC)   8. Lung nodule   9. History of gastroesophageal reflux (GERD)     PLAN:  Bryan [name] is a 78 year old gentleman who has a history of hypertension, hyperlipidemia, spinal stenosis, and GERD.  He has been active for  most of his life and swims almost daily and was playing golf several days per week.  There is no tobacco history or alcohol use.  He had been more aggressive with dietary change and had not been eating any meat since early July 2022.  He presented with an acute inferior wall STEMI on January 17, 2021 and was found to have mid-distal thrombotic occlusion of a large dominant RCA.  He underwent difficult but successful coronary intervention to his calcified tortuous vessel with ultimate stenting with a 3.5 x 15 mm Medtronic Onyx frontiers stent postdilated to 3.8 mm at the occlusion site with resumption of brisk TIMI-3 flow and due to proximal 80% stenosis underwent stenting with a 3.5 x 18 mm stent postdilated to 3.85 mm.  His initial EF the following day showed reduced LV function with an EF at approximately 35 to 40%.  Of note, he also had moderate dilatation of his ascending aorta at 45 mm.  He had an uneventful postoperative course and was discharged 2 days following  his MI.  When I saw him for his initial post hospital office evaluation in August 2022 with his reduced LV function, I recommended titration of losartan  to 25 mg twice a day from its present dose of 25 mg daily.  He was on metoprolol  succinate 25 mg daily.  He has continued to feel well.  His subsequent echo Doppler study from March 26, 2021 demonstrated improved LV function with EF now at 45 to 50%.  At that time with class I symptoms rather than initiate Entresto I further titrated losartan  to 50 mg in the morning and 25 mg in the evening for more optimal blood pressure control.  He has continued to be on metoprolol  succinate.  Due to concerns of possible progressive ascending aortic dilatation at 49 mm on the October 2022 echo, he was referred for CT angio of his chest and aorta  on May 16, 2021.  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  Incidentally he was found to have an 8 mm right lower lobe pulmonary nodule and noncontrast chest CT has been recommended in 6 to 12 months.  He has undergone subsequent imaging and has established with Dr. Linder Revere to follow his aortic root dilation.  In January 2024 CT angio of his chest and aorta showed progressive aortic root dilatation of 4.7 with no evidence for thoracic aortic aneurysm elsewhere or dissection.  This remains stable on his most recent CT on June 24, 2023 and January 2025 had follow-up evaluation with Dr. Sherene Dilling.  His blood pressure today is stable on his current regimen of losartan  100 mg and he continues to be on metoprolol  succinate 25 mg daily.  He was changed to aspirin /Plavix  last year.  He does experience some easy bruisability.  I have recommended he discontinue aspirin  and continue Plavix  alone.  He continues to be on rosuvastatin  40 mg daily.  Laboratory follow-up by Dr. Bambi Lever in July 2024 showed LDL at 28  with total cholesterol 91 and triglycerides 105.  He will be having follow-up laboratory in several months.  I discussed my plans for upcoming retirement.  I will transition him to the care of Dr. Randene Bustard.  Repeat laboratory will be obtained prior to his 6 to 9-month follow-up evaluation.    Medication Adjustments/Labs and Tests Ordered: Current medicines are  reviewed at length with the patient today.  Concerns regarding medicines are outlined above.  Medication changes, Labs and Tests ordered today are listed in the Patient Instructions below. Patient Instructions  Medication Instructions:  NO CHANGES *If you need a refill on your cardiac medications before your next appointment, please call your pharmacy*  Lab Work: NO LABS If you have labs (blood work) drawn today and your tests are completely normal, you will receive your results only by: MyChart Message (if you have MyChart) OR A paper copy in the mail If you have any lab test that is abnormal or we need to change your treatment, we will call you to review the results.  Testing/Procedures: NO TESTING  Follow-Up: At [facility], you and your health needs are our priority.  As part of our continuing mission to provide you with exceptional heart care, our providers are all part of one team.  This team includes your primary Cardiologist (physician) and Advanced Practice Providers or APPs (Physician Assistants and Nurse Practitioners) who all work together to provide you with the care you need, when you need it.  Your next appointment:   6-8 month(s)  Provider:   Randene Bustard, MD   Signed, Magnus Schuller, MD  10/30/2023 5:39 PM    [facility] [address], [place], [place]  [address] Phone: [phone]

## 2023-10-30 ENCOUNTER — Encounter: Payer: Self-pay | Admitting: Cardiovascular Disease

## 2023-11-04 ENCOUNTER — Other Ambulatory Visit: Payer: Self-pay | Admitting: Family Medicine

## 2023-11-04 DIAGNOSIS — M48062 Spinal stenosis, lumbar region with neurogenic claudication: Secondary | ICD-10-CM

## 2023-12-10 ENCOUNTER — Telehealth: Payer: Self-pay | Admitting: Sports Medicine

## 2023-12-10 NOTE — Telephone Encounter (Signed)
 Patient came in with some concerns in regards to the bill that he received for his visit. We discussed that and talked about the charges that were added for his visit.  He said that he did not recall Dr Leonce using the ultrasound machine on his shoulder.   I see the documentation and results in the chart, but he did not remember having this done.  Please advise.

## 2023-12-11 NOTE — Telephone Encounter (Signed)
 Spoke to patient and informed.

## 2023-12-16 DIAGNOSIS — M1711 Unilateral primary osteoarthritis, right knee: Secondary | ICD-10-CM | POA: Diagnosis not present

## 2023-12-22 ENCOUNTER — Other Ambulatory Visit: Payer: Self-pay | Admitting: Family Medicine

## 2023-12-22 DIAGNOSIS — M48062 Spinal stenosis, lumbar region with neurogenic claudication: Secondary | ICD-10-CM

## 2024-01-11 ENCOUNTER — Other Ambulatory Visit: Payer: Self-pay

## 2024-01-11 DIAGNOSIS — E785 Hyperlipidemia, unspecified: Secondary | ICD-10-CM

## 2024-01-11 MED ORDER — CLOPIDOGREL BISULFATE 75 MG PO TABS: 75.0000 mg | ORAL_TABLET | Freq: Every day | ORAL | 2 refills | Status: AC

## 2024-01-13 ENCOUNTER — Ambulatory Visit: Payer: Medicare Other | Admitting: Family Medicine

## 2024-01-25 ENCOUNTER — Encounter: Payer: Self-pay | Admitting: Family Medicine

## 2024-01-25 ENCOUNTER — Ambulatory Visit (INDEPENDENT_AMBULATORY_CARE_PROVIDER_SITE_OTHER): Admitting: Family Medicine

## 2024-01-25 VITALS — BP 100/60 | HR 80 | Temp 98.8°F | Ht 75.0 in | Wt 203.9 lb

## 2024-01-25 DIAGNOSIS — R739 Hyperglycemia, unspecified: Secondary | ICD-10-CM | POA: Diagnosis not present

## 2024-01-25 DIAGNOSIS — I110 Hypertensive heart disease with heart failure: Secondary | ICD-10-CM

## 2024-01-25 DIAGNOSIS — I5022 Chronic systolic (congestive) heart failure: Secondary | ICD-10-CM

## 2024-01-25 DIAGNOSIS — I10 Essential (primary) hypertension: Secondary | ICD-10-CM

## 2024-01-25 DIAGNOSIS — E785 Hyperlipidemia, unspecified: Secondary | ICD-10-CM | POA: Diagnosis not present

## 2024-01-25 DIAGNOSIS — M48062 Spinal stenosis, lumbar region with neurogenic claudication: Secondary | ICD-10-CM

## 2024-01-25 DIAGNOSIS — Z0000 Encounter for general adult medical examination without abnormal findings: Secondary | ICD-10-CM | POA: Diagnosis not present

## 2024-01-25 LAB — LIPID PANEL
Cholesterol: 107 mg/dL (ref 0–200)
HDL: 47.1 mg/dL (ref >39.00)
LDL Cholesterol: 44 mg/dL (ref 0–99)
NonHDL: 60.07
Total CHOL/HDL Ratio: 2
Triglycerides: 82 mg/dL (ref 0.0–149.0)
VLDL: 16.4 mg/dL (ref 0.0–40.0)

## 2024-01-25 LAB — COMPREHENSIVE METABOLIC PANEL WITH GFR
ALT: 27 U/L (ref 0–53)
AST: 29 U/L (ref 0–37)
Albumin: 4.4 g/dL (ref 3.5–5.2)
Alkaline Phosphatase: 47 U/L (ref 39–117)
BUN: 22 mg/dL (ref 6–23)
CO2: 29 meq/L (ref 19–32)
Calcium: 9.9 mg/dL (ref 8.4–10.5)
Chloride: 103 meq/L (ref 96–112)
Creatinine, Ser: 1.1 mg/dL (ref 0.40–1.50)
GFR: 65.61 mL/min (ref >60.00)
Glucose, Bld: 113 mg/dL — ABNORMAL HIGH (ref 70–99)
Potassium: 4.6 meq/L (ref 3.5–5.1)
Sodium: 140 meq/L (ref 135–145)
Total Bilirubin: 1.5 mg/dL — ABNORMAL HIGH (ref 0.2–1.2)
Total Protein: 6.7 g/dL (ref 6.0–8.3)

## 2024-01-25 LAB — HEMOGLOBIN A1C: Hgb A1c MFr Bld: 6 % (ref 4.6–6.5)

## 2024-01-25 MED ORDER — TRAMADOL HCL 50 MG PO TABS: 50.0000 mg | ORAL_TABLET | Freq: Four times a day (QID) | ORAL | 2 refills | Status: AC | PRN

## 2024-01-25 MED ORDER — GABAPENTIN 100 MG PO CAPS: 200.0000 mg | ORAL_CAPSULE | Freq: Every day | ORAL | 1 refills | Status: AC

## 2024-01-25 MED ORDER — TIZANIDINE HCL 4 MG PO TABS: 4.0000 mg | ORAL_TABLET | Freq: Three times a day (TID) | ORAL | 0 refills | Status: AC | PRN

## 2024-01-25 NOTE — Assessment & Plan Note (Signed)
 Chronic, pain level is stable, controlled. He is taking tramadol every 6 hours and gabapentin at bedtime. He cannot take NSAIDS due to hx of CAD/ AMI.

## 2024-01-25 NOTE — Progress Notes (Signed)
 Complete physical exam  Patient: Bryan [name]   DOB: [birth year]   78 y.o. Male  MRN: [id number]  Subjective:    Chief Complaint  Patient presents with   Medical Management of Chronic Issues    Bryan [name] is a 78 y.o. male who presents today for a complete physical exam. He reports consuming a general diet. Gym/ health club routine includes swimming. Also plays golf regularly. He generally feels well. He reports sleeping well. He does not have additional problems to discuss today.   Chronic lumbar back pain - We reviewed his problem list today, he needs refills on the tramadol , gabapentin  and tizanidine  for his chronic lumbar back pain with radiculopathy. He reports these medications are working well for him, usually only takes the tizanidine  as needed when he is feeling stiff. He denies any side effects to the medications. Reviewed PDMP.   Most recent fall risk assessment:    07/14/2023    8:20 AM  Fall Risk   Falls in the past year? 1  Number falls in past yr: 0  Injury with Fall? 1  Risk for fall due to : Impaired balance/gait  Follow up Falls evaluation completed     Most recent depression screenings:    07/14/2023    8:19 AM 03/12/2023    4:18 PM  PHQ 2/9 Scores  PHQ - 2 Score 0 0  PHQ- 9 Score 1     Vision:sees Dr. Octavia, has appt in sept. and Dental: No current dental problems and Receives regular dental care  Patient Active Problem List   Diagnosis Date Noted   Coronary atherosclerosis of native coronary artery 01/03/2022    Priority: High   Chronic systolic congestive heart failure (HCC) 01/06/2022   STEMI involving right coronary artery (HCC) 01/17/2021   ST elevation myocardial infarction involving right coronary artery (HCC)    Right groin pain 09/07/2019   S/P right THA, AA 02/09/2018   S/P hip replacement 02/09/2018   Osteoarthritis of right hip joint due to dysplasia 01/04/2018   Lumbar stenosis with neurogenic claudication 11/11/2017    PROSTATE SPECIFIC ANTIGEN, ELEVATED 07/19/2009   Raised prostate specific antigen 07/19/2009   Backache 06/11/2007   Essential hypertension 05/21/2007   Osteoarthritis 05/21/2007      Patient Care Team: Ozell Heron HERO, MD as PCP - General (Family Medicine) Burnard Debby LABOR, MD (Inactive) as PCP - Cardiology (Cardiology) Nieves Cough, MD as Consulting Physician (Urology) Joshua Sieving, MD as Consulting Physician (Dermatology) Sheldon Standing, MD as Consulting Physician (General Surgery) Ernie Cough, MD as Consulting Physician (Orthopedic Surgery) Alix Charleston, MD as Consulting Physician (Neurosurgery) Lionell Jon DEL, [facility] (Pharmacist)   Outpatient Medications Prior to Visit  Medication Sig   acetaminophen  (TYLENOL ) 500 MG tablet Take 500 mg by mouth daily as needed for moderate pain.   aspirin  81 MG chewable tablet Chew 1 tablet (81 mg total) by mouth daily.   calcium  carbonate (TUMS - DOSED IN MG ELEMENTAL CALCIUM ) 500 MG chewable tablet Chew 2 tablets by mouth daily as needed for indigestion or heartburn.   Cholecalciferol (VITAMIN D3) 2000 units TABS Take 2,000 Units by mouth daily.   clopidogrel  (PLAVIX ) 75 MG tablet Take 1 tablet (75 mg total) by mouth daily.   losartan  (COZAAR ) 50 MG tablet TAKE ONE TABLET EVERY MORNING AND TAKE 1/2 TABLET EVERY EVENING   Menthol , Topical Analgesic, (BIOFREEZE) 4 % GEL Apply 1 application topically daily as needed (pain).   metoprolol  succinate (TOPROL -XL) 25 MG  24 hr tablet TAKE ONE TABLET BY MOUTH DAILY   Multiple Vitamins-Minerals (HAIR SKIN AND NAILS FORMULA PO) Take 1 tablet by mouth daily.   nitroGLYCERIN  (NITROSTAT ) 0.4 MG SL tablet Place 1 tablet (0.4 mg total) under the tongue every 5 (five) minutes as needed for chest pain.   Omega-3 Fatty Acids (FISH OIL ) 1200 MG CAPS Take 1 capsule (1,200 mg total) by mouth daily.   rosuvastatin  (CRESTOR ) 40 MG tablet TAKE ONE TABLET BY MOUTH DAILY   vitamin B-12 (CYANOCOBALAMIN ) 1000  MCG tablet Take 1,000 mcg by mouth daily.   [DISCONTINUED] gabapentin  (NEURONTIN ) 100 MG capsule TAKE TWO CAPSULES BY MOUTH AT BEDTIME   [DISCONTINUED] tiZANidine  (ZANAFLEX ) 4 MG tablet Take 1 tablet (4 mg total) by mouth every 8 (eight) hours as needed for muscle spasms.   [DISCONTINUED] traMADol  (ULTRAM ) 50 MG tablet TAKE ONE TABLET BY MOUTH EVERY 6 HOURS AS NEEDED   [DISCONTINUED] MAGNESIUM  CITRATE PO Take by mouth daily.   [DISCONTINUED] vitamin C (ASCORBIC ACID) 250 MG tablet Take 250 mg by mouth daily.   No facility-administered medications prior to visit.    Review of Systems  HENT:  Negative for hearing loss.   Eyes:  Negative for blurred vision.  Respiratory:  Negative for shortness of breath.   Cardiovascular:  Negative for chest pain.  Gastrointestinal: Negative.   Genitourinary: Negative.   Musculoskeletal:  Negative for back pain.  Neurological:  Negative for headaches.  Psychiatric/Behavioral:  Negative for depression.        Objective:     BP 100/60   Pulse 80   Temp 98.8 F (37.1 C) (Oral)   Ht 6' 3 (1.905 m)   Wt 203 lb 14.4 oz (92.5 kg)   SpO2 100%   BMI 25.49 kg/m    Physical Exam Vitals reviewed.  Constitutional:      Appearance: Normal appearance. He is well-groomed and normal weight.  HENT:     Right Ear: Tympanic membrane and ear canal normal.     Left Ear: Tympanic membrane and ear canal normal.     Mouth/Throat:     Mouth: Mucous membranes are moist.     Pharynx: No posterior oropharyngeal erythema.  Eyes:     Extraocular Movements: Extraocular movements intact.     Conjunctiva/sclera: Conjunctivae normal.  Neck:     Thyroid : No thyromegaly.  Cardiovascular:     Rate and Rhythm: Normal rate and regular rhythm.     Heart sounds: S1 normal and S2 normal. No murmur heard. Pulmonary:     Effort: Pulmonary effort is normal.     Breath sounds: Normal breath sounds and air entry. No rales.  Abdominal:     General: Abdomen is flat. Bowel  sounds are normal.  Musculoskeletal:     Right lower leg: No edema.     Left lower leg: No edema.  Lymphadenopathy:     Cervical: No cervical adenopathy.  Neurological:     General: No focal deficit present.     Mental Status: He is alert and oriented to person, place, and time.     Gait: Gait is intact.  Psychiatric:        Mood and Affect: Mood and affect normal.      No results found for any visits on 01/25/24.     Assessment & Plan:    Routine Health Maintenance and Physical Exam  Immunization History  Administered Date(s) Administered   Fluad Quad(high Dose 65+) 03/02/2019, 03/05/2020, 03/20/2021, 03/13/2022  Influenza Split 03/30/2013   Influenza, High Dose Seasonal PF 03/26/2015, 03/25/2016, 04/13/2017, 03/23/2018   Influenza,inj,Quad PF,6+ Mos 03/21/2014   Influenza-Unspecified 03/17/2023   PFIZER(Purple Top)SARS-COV-2 Vaccination 07/10/2019, 08/01/2019, 03/31/2020   PNEUMOCOCCAL CONJUGATE-20 06/21/2021   Pneumococcal Conjugate-13 03/21/2014   Pneumococcal Polysaccharide-23 03/26/2015   Td 07/19/2009, 05/24/2020   Unspecified SARS-COV-2 Vaccination 03/17/2023   Zoster Recombinant(Shingrix) 09/14/2020, 02/15/2021    Health Maintenance  Topic Date Due   COVID-19 Vaccine (5 - 2024-25 season) 09/15/2023   INFLUENZA VACCINE  01/15/2024   Medicare Annual Wellness (AWV)  03/11/2024   Hepatitis C Screening  07/13/2024 (Originally 06/05/1964)   Colonoscopy  12/16/2025   DTaP/Tdap/Td (3 - Tdap) 05/24/2030   Pneumococcal Vaccine: 50+ Years  Completed   Zoster Vaccines- Shingrix  Completed   Hepatitis B Vaccines  Aged Out   HPV VACCINES  Aged Out   Meningococcal B Vaccine  Aged Out    Discussed health benefits of physical activity, and encouraged him to engage in regular exercise appropriate for his age and condition.  Essential hypertension -     Comprehensive metabolic panel with GFR; Future  Lumbar stenosis with neurogenic claudication Assessment &  Plan: Chronic, pain level is stable, controlled. He is taking tramadol every 6 hours and gabapentin at bedtime. He cannot take NSAIDS due to hx of CAD/ AMI.   Orders: -     traMADol HCl; Take 1 tablet (50 mg total) by mouth every 6 (six) hours as needed.  Dispense: 120 tablet; Refill: 2 -     tiZANidine HCl; Take 1 tablet (4 mg total) by mouth every 8 (eight) hours as needed for muscle spasms.  Dispense: 90 tablet; Refill: 0 -     Gabapentin; Take 2 capsules (200 mg total) by mouth at bedtime.  Dispense: 180 capsule; Refill: 1  Dyslipidemia -     Lipid panel; Future  Hyperglycemia A1C's were :5.9 -> 5.7, will order new A1C today for surveillance.  -     Hemoglobin A1c; Future  Chronic systolic congestive heart failure (HCC) Assessment & Plan: Chronic, stable, no swelling or SOB, this is managed by cardiology. EF on ECHO in 11/24 was 45-50%, ischemic cardiomyopathy most likely. No issues today.   Normal physical exam findings. I counseled the patient on the recommended amount of exercise per CDC recommendation. I reviewed preventative screening, immunizations, and medical history and updated in the chart, and appropriate labs and vaccinations were ordered. Handouts given on healthy eating and exercise.    Return in about 6 months (around 07/27/2024) for medication refills.     Heron CHRISTELLA Sharper, MD

## 2024-01-25 NOTE — Assessment & Plan Note (Signed)
 Chronic, stable, no swelling or SOB, this is managed by cardiology. EF on ECHO in 11/24 was 45-50%, ischemic cardiomyopathy most likely. No issues today.

## 2024-01-26 ENCOUNTER — Ambulatory Visit: Payer: Self-pay | Admitting: Family Medicine

## 2024-02-03 DIAGNOSIS — M1711 Unilateral primary osteoarthritis, right knee: Secondary | ICD-10-CM | POA: Diagnosis not present

## 2024-02-16 DIAGNOSIS — D3132 Benign neoplasm of left choroid: Secondary | ICD-10-CM | POA: Diagnosis not present

## 2024-02-16 DIAGNOSIS — H40013 Open angle with borderline findings, low risk, bilateral: Secondary | ICD-10-CM | POA: Diagnosis not present

## 2024-02-16 DIAGNOSIS — H02831 Dermatochalasis of right upper eyelid: Secondary | ICD-10-CM | POA: Diagnosis not present

## 2024-02-16 DIAGNOSIS — H43813 Vitreous degeneration, bilateral: Secondary | ICD-10-CM | POA: Diagnosis not present

## 2024-02-16 DIAGNOSIS — H02834 Dermatochalasis of left upper eyelid: Secondary | ICD-10-CM | POA: Diagnosis not present

## 2024-02-16 DIAGNOSIS — H2513 Age-related nuclear cataract, bilateral: Secondary | ICD-10-CM | POA: Diagnosis not present

## 2024-02-22 DIAGNOSIS — C44229 Squamous cell carcinoma of skin of left ear and external auricular canal: Secondary | ICD-10-CM | POA: Diagnosis not present

## 2024-02-22 DIAGNOSIS — D485 Neoplasm of uncertain behavior of skin: Secondary | ICD-10-CM | POA: Diagnosis not present

## 2024-02-22 DIAGNOSIS — L570 Actinic keratosis: Secondary | ICD-10-CM | POA: Diagnosis not present

## 2024-02-22 DIAGNOSIS — C44319 Basal cell carcinoma of skin of other parts of face: Secondary | ICD-10-CM | POA: Diagnosis not present

## 2024-03-30 DIAGNOSIS — C44319 Basal cell carcinoma of skin of other parts of face: Secondary | ICD-10-CM | POA: Diagnosis not present

## 2024-05-25 ENCOUNTER — Ambulatory Visit (INDEPENDENT_AMBULATORY_CARE_PROVIDER_SITE_OTHER)

## 2024-05-25 VITALS — Ht 75.0 in | Wt 203.0 lb

## 2024-05-25 DIAGNOSIS — Z0000 Encounter for general adult medical examination without abnormal findings: Secondary | ICD-10-CM | POA: Diagnosis not present

## 2024-05-25 NOTE — Progress Notes (Signed)
 Chief Complaint  Patient presents with   Medicare Wellness     Subjective:   Bryan [name] is a 78 y.o. male who presents for a Medicare Annual Wellness Visit.  Visit info / Clinical Intake: Medicare Wellness Visit Type:: Subsequent Annual Wellness Visit Persons participating in visit and providing information:: patient Medicare Wellness Visit Mode:: Telephone If telephone:: video declined Since this visit was completed virtually, some vitals may be partially provided or unavailable. Missing vitals are due to the limitations of the virtual format.: Documented vitals are patient reported If Telephone or Video please confirm:: I connected with patient using audio/video enable telemedicine. I verified patient identity with two identifiers, discussed telehealth limitations, and patient agreed to proceed. Patient Location:: Home Provider Location:: Office Interpreter Needed?: No Pre-visit prep was completed: no AWV questionnaire completed by patient prior to visit?: no Living arrangements:: lives with spouse/significant other Patient's Overall Health Status Rating: very good Typical amount of pain: none Does pain affect daily life?: no Are you currently prescribed opioids?: no  Dietary Habits and Nutritional Risks How many meals a day?: 3 Eats fruit and vegetables daily?: yes Most meals are obtained by: preparing own meals In the last 2 weeks, have you had any of the following?: none Diabetic:: no  Functional Status Activities of Daily Living (to include ambulation/medication): Independent Ambulation: Independent with device- listed below Home Assistive Devices/Equipment: Eyeglasses Medication Administration: Independent Home Management (perform basic housework or laundry): Independent Manage your own finances?: yes Primary transportation is: driving Concerns about vision?: no *vision screening is required for WTM* Concerns about hearing?: no  Fall Screening Falls in the  past year?: 0 Number of falls in past year: 0 Was there an injury with Fall?: 0 Fall Risk Category Calculator: 0 Patient Fall Risk Level: Low Fall Risk  Fall Risk Patient at Risk for Falls Due to: No Fall Risks Fall risk Follow up: Falls evaluation completed  Home and Transportation Safety: All rugs have non-skid backing?: yes All stairs or steps have railings?: N/A, no stairs Grab bars in the bathtub or shower?: (!) no Have non-skid surface in bathtub or shower?: yes Good home lighting?: yes Regular seat belt use?: yes Hospital stays in the last year:: no  Cognitive Assessment Difficulty concentrating, remembering, or making decisions? : no Will 6CIT or Mini Cog be Completed: yes 6CIT or Mini Cog Declined: patient alert, oriented, able to answer questions appropriately and recall recent events What year is it?: 0 points What month is it?: 0 points Give patient an address phrase to remember (5 components): [address] [place]  About what time is it?: 0 points Count backwards from 20 to 1: 0 points Say the months of the year in reverse: 0 points Repeat the address phrase from earlier: 0 points 6 CIT Score: 0 points  Advance Directives (For Healthcare) Does Patient Have a Medical Advance Directive?: No Would patient like information on creating a medical advance directive?: No - Patient declined  Reviewed/Updated  Reviewed/Updated: Reviewed All (Medical, Surgical, Family, Medications, Allergies, Care Teams, Patient Goals)    Allergies (verified) Codeine   Current Medications (verified) Outpatient Encounter Medications as of 05/25/2024  Medication Sig   acetaminophen  (TYLENOL ) 500 MG tablet Take 500 mg by mouth daily as needed for moderate pain.   aspirin  81 MG chewable tablet Chew 1 tablet (81 mg total) by mouth daily.   calcium  carbonate (TUMS - DOSED IN MG ELEMENTAL CALCIUM ) 500 MG chewable tablet Chew 2 tablets by mouth daily as  needed for indigestion or  heartburn.   Cholecalciferol (VITAMIN D3) 2000 units TABS Take 2,000 Units by mouth daily.   clopidogrel  (PLAVIX ) 75 MG tablet Take 1 tablet (75 mg total) by mouth daily.   gabapentin  (NEURONTIN ) 100 MG capsule Take 2 capsules (200 mg total) by mouth at bedtime.   losartan  (COZAAR ) 50 MG tablet TAKE ONE TABLET EVERY MORNING AND TAKE 1/2 TABLET EVERY EVENING   Menthol , Topical Analgesic, (BIOFREEZE) 4 % GEL Apply 1 application topically daily as needed (pain).   metoprolol  succinate (TOPROL -XL) 25 MG 24 hr tablet TAKE ONE TABLET BY MOUTH DAILY   Multiple Vitamins-Minerals (HAIR SKIN AND NAILS FORMULA PO) Take 1 tablet by mouth daily.   nitroGLYCERIN  (NITROSTAT ) 0.4 MG SL tablet Place 1 tablet (0.4 mg total) under the tongue every 5 (five) minutes as needed for chest pain.   Omega-3 Fatty Acids (FISH OIL ) 1200 MG CAPS Take 1 capsule (1,200 mg total) by mouth daily.   rosuvastatin  (CRESTOR ) 40 MG tablet TAKE ONE TABLET BY MOUTH DAILY   tiZANidine  (ZANAFLEX ) 4 MG tablet Take 1 tablet (4 mg total) by mouth every 8 (eight) hours as needed for muscle spasms.   traMADol  (ULTRAM ) 50 MG tablet Take 1 tablet (50 mg total) by mouth every 6 (six) hours as needed.   vitamin B-12 (CYANOCOBALAMIN ) 1000 MCG tablet Take 1,000 mcg by mouth daily.   No facility-administered encounter medications on file as of 05/25/2024.    History: Past Medical History:  Diagnosis Date   Anxiety    Cancer (HCC)    facial skin cancer   Depression    GERD (gastroesophageal reflux disease)    takes tums rarely   Hematuria    1 time in july 2019   Hypertension    Osteoarthritis    Spinal stenosis    ST elevation myocardial infarction involving right coronary artery [facility])    Past Surgical History:  Procedure Laterality Date   ACHILLES TENDON SURGERY  02/2006   ANTERIOR LAT LUMBAR FUSION N/A 11/11/2017   Procedure: LUMBAR TWO- LUMBAR THREE, LUMBAR THREE- LUMBAR FOUR, LUMBAR FOUR- LUMBAR FIVE ANTERIOLATERAL INTERBODY  ARTHRODESIS;  Surgeon: Alix Charleston, MD;  Location: [facility] OR;  Service: Neurosurgery;  Laterality: N/A;   COLONOSCOPY  2003   COLONOSCOPY     CORONARY STENT INTERVENTION N/A 01/17/2021   Procedure: CORONARY STENT INTERVENTION;  Surgeon: Burnard Debby LABOR, MD;  Location: [facility] INVASIVE CV LAB;  Service: Cardiovascular;  Laterality: N/A;   CORONARY/GRAFT ACUTE MI REVASCULARIZATION N/A 01/17/2021   Procedure: Coronary/Graft Acute MI Revascularization;  Surgeon: Burnard Debby LABOR, MD;  Location: [facility] INVASIVE CV LAB;  Service: Cardiovascular;  Laterality: N/A;   CYSTOSCOPY N/A 05/21/2018   Procedure: CYSTOSCOPY FLEXIBLE;  Surgeon: Nieves Cough, MD;  Location: [facility];  Service: Urology;  Laterality: N/A;   KNEE SURGERY Left 1991 and 2001   X2 arthroscopy   LEFT HEART CATH AND CORONARY ANGIOGRAPHY N/A 01/17/2021   Procedure: LEFT HEART CATH AND CORONARY ANGIOGRAPHY;  Surgeon: Burnard Debby LABOR, MD;  Location: [facility] INVASIVE CV LAB;  Service: Cardiovascular;  Laterality: N/A;   LUMBAR PERCUTANEOUS PEDICLE SCREW 3 LEVEL N/A 11/11/2017   Procedure: LUMBAR PERCUTANEOUS PEDICLE SCREW PLACEMENT LUMBAR TWO-THREE, LUMBER THREE-FOUR, LUMBAR FOUR-FIVE;  Surgeon: Alix Charleston, MD;  Location: [facility] OR;  Service: Neurosurgery;  Laterality: N/A;   SPINE SURGERY  12-08, 5-10   TOTAL HIP ARTHROPLASTY     oct 2006,   TOTAL HIP ARTHROPLASTY Right 02/09/2018   Procedure: RIGHT  TOTAL HIP ARTHROPLASTY ANTERIOR APPROACH;  Surgeon: Ernie Cough, MD;  Location: [facility] ORS;  Service: Orthopedics;  Laterality: Right;   Family History  Problem Relation Age of Onset   Diabetes Mother    Coronary artery disease Mother 83   Hypertension Mother    Other Father 25   Colon polyps Neg Hx    Esophageal cancer Neg Hx    Rectal cancer Neg Hx    Stomach cancer Neg Hx    Social History   Occupational History   Occupation: retired  Tobacco Use   Smoking status: Never   Smokeless tobacco: Never  Vaping Use   Vaping  status: Never Used  Substance and Sexual Activity   Alcohol use: No   Drug use: No   Sexual activity: Not on file   Tobacco Counseling Counseling given: No  SDOH Screenings   Food Insecurity: No Food Insecurity (05/25/2024)  Housing: Unknown (05/25/2024)  Transportation Needs: No Transportation Needs (05/25/2024)  Utilities: Not At Risk (05/25/2024)  Alcohol Screen: Low Risk  (03/12/2023)  Depression (PHQ2-9): Low Risk  (05/25/2024)  Financial Resource Strain: Low Risk  (03/12/2023)  Physical Activity: Sufficiently Active (05/25/2024)  Social Connections: Socially Integrated (05/25/2024)  Stress: No Stress Concern Present (05/25/2024)  Tobacco Use: Low Risk  (05/25/2024)  Health Literacy: Adequate Health Literacy (05/25/2024)   See flowsheets for full screening details  Depression Screen PHQ 2 & 9 Depression Scale- Over the past 2 weeks, how often have you been bothered by any of the following problems? Little interest or pleasure in doing things: 0 Feeling down, depressed, or hopeless (PHQ Adolescent also includes...irritable): 0 PHQ-2 Total Score: 0 Trouble falling or staying asleep, or sleeping too much: 0 Feeling tired or having little energy: 0 Poor appetite or overeating (PHQ Adolescent also includes...weight loss): 0 Feeling bad about yourself - or that you are a failure or have let yourself or your family down: 1 Trouble concentrating on things, such as reading the newspaper or watching television (PHQ Adolescent also includes...like school work): 0 Moving or speaking so slowly that other people could have noticed. Or the opposite - being so fidgety or restless that you have been moving around a lot more than usual: 0 Thoughts that you would be better off dead, or of hurting yourself in some way: 0 PHQ-9 Total Score: 1 If you checked off any problems, how difficult have these problems made it for you to do your work, take care of things at home, or get along with other  people?: Not difficult at all     Goals Addressed               This Visit's Progress     Remain Active (pt-stated)               Objective:    Today's Vitals   05/25/24 1543  Weight: 203 lb (92.1 kg)  Height: 6' 3 (1.905 m)   Body mass index is 25.37 kg/m.  Hearing/Vision screen Hearing Screening - Comments:: Denies hearing difficulties   Vision Screening - Comments:: Wears rx glasses - up to date with routine eye exams with  Dr Octavia Immunizations and Health Maintenance Health Maintenance  Topic Date Due   COVID-19 Vaccine (5 - 2025-26 season) 02/15/2024   Hepatitis C Screening  07/13/2024 (Originally 06/05/1964)   Medicare Annual Wellness (AWV)  05/25/2025   Colonoscopy  12/16/2025   DTaP/Tdap/Td (3 - Tdap) 05/24/2030   Pneumococcal Vaccine: 50+ Years  Completed  Influenza Vaccine  Completed   Zoster Vaccines- Shingrix  Completed   Meningococcal B Vaccine  Aged Out        Assessment/Plan:  This is a routine wellness examination for [place].  Patient Care Team: Ozell Heron HERO, MD as PCP - General (Family Medicine) Burnard Debby LABOR, MD (Inactive) as PCP - Cardiology (Cardiology) Nieves Cough, MD as Consulting Physician (Urology) Joshua Sieving, MD as Consulting Physician (Dermatology) Sheldon Standing, MD as Consulting Physician (General Surgery) Ernie Cough, MD as Consulting Physician (Orthopedic Surgery) Alix Charleston, MD as Consulting Physician (Neurosurgery) [place], Jon DEL, [facility] (Pharmacist)  I have personally reviewed and noted the following in the patients chart:   Medical and social history Use of alcohol, tobacco or illicit drugs  Current medications and supplements including opioid prescriptions. Functional ability and status Nutritional status Physical activity Advanced directives List of other physicians Hospitalizations, surgeries, and ER visits in previous 12 months Vitals Screenings to include cognitive, depression,  and falls Referrals and appointments  No orders of the defined types were placed in this encounter.  In addition, I have reviewed and discussed with patient certain preventive protocols, quality metrics, and best practice recommendations. A written personalized care plan for preventive services as well as general preventive health recommendations were provided to patient.   Rojelio LELON Blush, LPN   [id number]   Return in 53 weeks (on 05/31/2025).  After Visit Summary: (MyChart) Due to this being a telephonic visit, the after visit summary with patients personalized plan was offered to patient via MyChart   Nurse Notes: No voiced or noted concerns at this time

## 2024-05-25 NOTE — Patient Instructions (Addendum)
 Mr. Bryan [name],  Thank you for taking the time for your Medicare Wellness Visit. I appreciate your continued commitment to your health goals. Please review the care plan we discussed, and feel free to reach out if I can assist you further.  Please note that Annual Wellness Visits do not include a physical exam. Some assessments may be limited, especially if the visit was conducted virtually. If needed, we may recommend an in-person follow-up with your provider.  Ongoing Care Seeing your primary care provider every 3 to 6 months helps us  monitor your health and provide consistent, personalized care.   Referrals If a referral was made during today's visit and you haven't received any updates within two weeks, please contact the referred provider directly to check on the status.  Recommended Screenings:  Health Maintenance  Topic Date Due   COVID-19 Vaccine (5 - 2025-26 season) 02/15/2024   Hepatitis C Screening  07/13/2024*   Medicare Annual Wellness Visit  05/25/2025   Colon Cancer Screening  12/16/2025   DTaP/Tdap/Td vaccine (3 - Tdap) 05/24/2030   Pneumococcal Vaccine for age over 84  Completed   Flu Shot  Completed   Zoster (Shingles) Vaccine  Completed   Meningitis B Vaccine  Aged Out  *Topic was postponed. The date shown is not the original due date.   Opioid Pain Medicine Management Opioids are powerful medicines that are used to treat moderate to severe pain. When used for short periods of time, they can help you to: Sleep better. Do better in physical or occupational therapy. Feel better in the first few days after an injury. Recover from surgery. Opioids should be taken with the supervision of a trained health care provider. They should be taken for the shortest period of time possible. This is because opioids can be addictive, and the longer you take opioids, the greater your risk of addiction. This addiction can also be called opioid use disorder. What are the risks? Using  opioid pain medicines for longer than 3 days increases your risk of side effects. Side effects include: Constipation. Nausea and vomiting. Breathing difficulties (respiratory depression). Drowsiness. Confusion. Opioid use disorder. Itching. Taking opioid pain medicine for a long period of time can affect your ability to do daily tasks. It also puts you at risk for: Motor vehicle crashes. Depression. Suicide. Heart attack. Overdose, which can be life-threatening. What is a pain treatment plan? A pain treatment plan is an agreement between you and your health care provider. Pain is unique to each person, and treatments vary depending on your condition. To manage your pain, you and your health care provider need to work together. To help you do this: Discuss the goals of your treatment, including how much pain you might expect to have and how you will manage the pain. Review the risks and benefits of taking opioid medicines. Remember that a good treatment plan uses more than one approach and minimizes the chance of side effects. Be honest about the amount of medicines you take and about any drug or alcohol use. Get pain medicine prescriptions from only one health care provider. Pain can be managed with many types of alternative treatments. Ask your health care provider to refer you to one or more specialists who can help you manage pain through: Physical or occupational therapy. Counseling (cognitive behavioral therapy). Good nutrition. Biofeedback. Massage. Meditation. Non-opioid medicine. Following a gentle exercise program. How to use opioid pain medicine Taking medicine Take your pain medicine exactly as told by your health care  provider. Take it only when you need it. If your pain gets less severe, you may take less than your prescribed dose if your health care provider approves. If you are not having pain, do nottake pain medicine unless your health care provider tells you to  take it. If your pain is severe, do nottry to treat it yourself by taking more pills than instructed on your prescription. Contact your health care provider for help. Write down the times when you take your pain medicine. It is easy to become confused while on pain medicine. Writing the time can help you avoid overdose. Take other over-the-counter or prescription medicines only as told by your health care provider. Keeping yourself and others safe  While you are taking opioid pain medicine: Do not drive, use machinery, or power tools. Do not sign legal documents. Do not drink alcohol. Do not take sleeping pills. Do not supervise children by yourself. Do not do activities that require climbing or being in high places. Do not go to a lake, river, ocean, spa, or swimming pool. Do not share your pain medicine with anyone. Keep pain medicine in a locked cabinet or in a secure area where pets and children cannot reach it. Stopping your use of opioids If you have been taking opioid medicine for more than a few weeks, you may need to slowly decrease (taper) how much you take until you stop completely. Tapering your use of opioids can decrease your risk of symptoms of withdrawal, such as: Pain and cramping in the abdomen. Nausea. Sweating. Sleepiness. Restlessness. Uncontrollable shaking (tremors). Cravings for the medicine. Do not attempt to taper your use of opioids on your own. Talk with your health care provider about how to do this. Your health care provider may prescribe a step-down schedule based on how much medicine you are taking and how long you have been taking it. Getting rid of leftover pills Do not save any leftover pills. Get rid of leftover pills safely by: Taking the medicine to a prescription take-back program. This is usually offered by the county or law enforcement. Bringing them to a pharmacy that has a drug disposal container. Flushing them down the toilet. Check the label  or package insert of your medicine to see whether this is safe to do. Throwing them out in the trash. Check the label or package insert of your medicine to see whether this is safe to do. If it is safe to throw it out, remove the medicine from the original container, put it into a sealable bag or container, and mix it with used coffee grounds, food scraps, dirt, or cat litter before putting it in the trash. Follow these instructions at home: Activity Do exercises as told by your health care provider. Avoid activities that make your pain worse. Return to your normal activities as told by your health care provider. Ask your health care provider what activities are safe for you. General instructions You may need to take these actions to prevent or treat constipation: Drink enough fluid to keep your urine pale yellow. Take over-the-counter or prescription medicines. Eat foods that are high in fiber, such as beans, whole grains, and fresh fruits and vegetables. Limit foods that are high in fat and processed sugars, such as fried or sweet foods. Keep all follow-up visits. This is important. Where to find support If you have been taking opioids for a long time, you may benefit from receiving support for quitting from a local support group or counselor.  Ask your health care provider for a referral to these resources in your area. Where to find more information Centers for Disease Control and Prevention (CDC): footballexhibition.com.br [place] Food and Drug Administration (FDA): pumpkinsearch.com.ee Get help right away if: You may have taken too much of an opioid (overdosed). Common symptoms of an overdose: Your breathing is slower or more shallow than normal. You have a very slow heartbeat (pulse). You have slurred speech. You have nausea and vomiting. Your pupils become very small. You have other potential symptoms: You are very confused. You faint or feel like you will faint. You have cold, clammy skin. You have blue  lips or fingernails. You have thoughts of harming yourself or harming others. These symptoms may represent a serious problem that is an emergency. Do not wait to see if the symptoms will go away. Get medical help right away. Call your local emergency services (911 in the [place] Do not drive yourself to the hospital.  If you ever feel like you may hurt yourself or others, or have thoughts about taking your own life, get help right away. Go to your nearest emergency department or: Call your local emergency services (911 in the [place] Call the [facility] ((773) 569-6460 in the [place] Call a suicide crisis helpline, such as the National Suicide Prevention Lifeline at 704-514-8715 or 988 in the [place] This is open 24 hours a day in the [place] If youre a Veteran: Call 988 and press 1. This is open 24 hours a day. Text the [employer] at 469-050-1651. Summary Opioid medicines can help you manage moderate to severe pain for a short period of time. A pain treatment plan is an agreement between you and your health care provider. Discuss the goals of your treatment, including how much pain you might expect to have and how you will manage the pain. If you think that you or someone else may have taken too much of an opioid, get medical help right away. This information is not intended to replace advice given to you by your health care provider. Make sure you discuss any questions you have with your health care provider. Document Revised: 03/09/2023 Document Reviewed: 09/12/2020 Elsevier Patient Education  2024 [employer] Inc.    05/25/2024    3:44 PM  Advanced Directives  Does Patient Have a Medical Advance Directive? No  Would patient like information on creating a medical advance directive? No - Patient declined    Vision: Annual vision screenings are recommended for early detection of glaucoma, cataracts, and diabetic retinopathy. These exams can also reveal signs of chronic  conditions such as diabetes and high blood pressure.  Dental: Annual dental screenings help detect early signs of oral cancer, gum disease, and other conditions linked to overall health, including heart disease and diabetes.  Please see the attached documents for additional preventive care recommendations.

## 2024-05-27 ENCOUNTER — Other Ambulatory Visit: Payer: Self-pay | Admitting: Surgery

## 2024-05-27 DIAGNOSIS — I7121 Aneurysm of the ascending aorta, without rupture: Secondary | ICD-10-CM

## 2024-06-03 ENCOUNTER — Other Ambulatory Visit: Payer: Self-pay | Admitting: Family Medicine

## 2024-06-03 DIAGNOSIS — M48062 Spinal stenosis, lumbar region with neurogenic claudication: Secondary | ICD-10-CM

## 2024-06-23 ENCOUNTER — Ambulatory Visit (HOSPITAL_COMMUNITY)
Admission: RE | Admit: 2024-06-23 | Discharge: 2024-06-23 | Disposition: A | Discharge status: Home / Self Care | Source: Ambulatory Visit | Attending: Cardiovascular Disease | Admitting: Cardiovascular Disease

## 2024-06-23 DIAGNOSIS — I7121 Aneurysm of the ascending aorta, without rupture: Secondary | ICD-10-CM | POA: Diagnosis present

## 2024-06-23 MED ORDER — IOHEXOL 350 MG/ML SOLN
75.0000 mL | Freq: Once | INTRAVENOUS | Status: AC | PRN
  Administered 2024-06-23: 75 mL via INTRAVENOUS

## 2024-06-28 ENCOUNTER — Other Ambulatory Visit: Payer: Self-pay | Admitting: General Practice

## 2024-06-28 MED ORDER — LOSARTAN POTASSIUM 50 MG PO TABS: ORAL_TABLET | ORAL | 3 refills | Status: AC

## 2024-07-06 ENCOUNTER — Other Ambulatory Visit: Payer: Self-pay | Admitting: General Practice

## 2024-07-07 ENCOUNTER — Ambulatory Visit

## 2024-07-07 VITALS — BP 128/67 | HR 69 | Resp 20 | Ht 75.0 in | Wt 209.0 lb

## 2024-07-07 DIAGNOSIS — I7121 Aneurysm of the ascending aorta, without rupture: Secondary | ICD-10-CM | POA: Diagnosis not present

## 2024-07-07 NOTE — Patient Instructions (Signed)

## 2024-07-07 NOTE — Progress Notes (Signed)
 "      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  On recent CTA of chest aneurysm measured 4.7 cm which is stable in size.  Echocardiogram in 04/2023 showed that the aortic valve is tricuspid and there was trivial aortic valve regurgitation.   He presents to the clinic and reports that he is doing well. His blood pressure is well controlled with current medications. He is active with water  walking, he was swimming but has a current injury to his left shoulder. He denies heavy lifting, chest pain, shortness of breath and lower leg edema.    Medications Ordered Prior to Encounter[1]   ROS: Review of Systems  Constitutional:  Negative for fever and malaise/fatigue.  Respiratory:  Negative for cough, shortness of breath and wheezing.   Cardiovascular:  Negative for chest pain, palpitations and leg swelling.     BP 128/67   Pulse 69   Resp 20   Ht 6' 3 (1.905 m)   Wt 209 lb (94.8 kg)   SpO2 96% Comment: RA  BMI 26.12 kg/m   Physical Exam Constitutional:      Appearance: Normal appearance.  HENT:     Head: Normocephalic and atraumatic.  Skin:    General: Skin is warm and dry.  Neurological:     General: No focal deficit present.     Mental Status: He is alert and oriented to person, place, and time.      Imaging: EXAM: CTA CHEST AORTA 06/23/2024 02:15:21 PM   TECHNIQUE: CTA of the chest was performed without and with the administration of 75 mL of intravenous iohexol  (OMNIPAQUE ) 350 MG/ML injection. Multiplanar reformatted images are provided for review. MIP images are provided for review. Automated exposure control,  iterative reconstruction, and/or weight based adjustment of the mA/kV was utilized to reduce the radiation dose to as low as reasonably achievable.   COMPARISON: 06/24/2023, 06/19/2022   CLINICAL HISTORY: Aortic aneurysm suspected   FINDINGS:   AORTA: Similar dilation of the aortic root at the sinuses of Valsalva measuring 4.7 cm. Ectasia of the ascending aorta measuring 3.8 cm. Otherwise, no aneurysm of the remaining thoracic aorta. Conventional 3 vessel aortic anatomy which is patent. Scattered calcified and noncalcified plaque throughout the great vessels and thoracic aorta. No thoracic aortic dissection.   MEDIASTINUM: No mediastinal lymphadenopathy. The heart demonstrates extensive multivessel coronary atherosclerosis. The pericardium demonstrates no acute abnormality. Fluid in the mid and distal esophagus.   LYMPH NODES: No mediastinal, hilar or axillary lymphadenopathy.   LUNGS AND PLEURA: Unchanged 6 mm right lower lobe nodule, which given the stability, is benign. No focal consolidation or pulmonary edema. No pleural effusion or pneumothorax.   UPPER ABDOMEN: Limited images of the upper abdomen are unremarkable.   SOFT TISSUES AND BONES: Small volume symmetric bilateral gynecomastia. Mild dextrocurvature of the thoracic spine with multilevel degenerative disc disease. Partially visualized lumbar fusion hardware. No acute bone or soft tissue abnormality.   IMPRESSION: 1. Similar dilation of the aortic root measuring 4.7 cm with unchanged ectasia of the ascending aorta. No  ascending , arch, or descending thoracic aortic aneurysm. 2. Extensively calcified, multivessel coronary atherosclerosis. 3. No pneumonia, pulmonary edema, or pleural effusion. 4. Fluid in the mid and distal esophagus, which may be due to incomplete emptying or gastroesophageal reflux disease.   Electronically signed by: Rogelia Myers MD MD 06/23/2024 02:55 PM EST RP Workstation:  [id number]     A/P:  Aneurysm of ascending aorta without rupture -4.7 cm aortic root aneurysm on CTA of chest. Echocardiogram showed tricuspid aortic valve with trivial aortic regurgitation.   -We discussed the natural history and and risk factors for growth of ascending aortic aneurysms. Discussed recommendations to minimize the risk of further expansion or dissection including careful blood pressure control, avoidance of contact sports and heavy lifting, attention to lipid management.  We covered the importance of staying never user of tobacco.  The patient does not yet meet surgical criteria of >5.5cm. The patient is aware of signs and symptoms of aortic dissection and when to present to the emergency department   -Follow up in one year with CTA of chest for continued surveillance    Risk Modification:  Statin:  rosuvastatin    Smoking cessation instruction/counseling given:  never user  Patient was counseled on importance of Blood Pressure Control  They are instructed to contact their Primary Care Physician if they start to have blood pressure readings over 130s/90s. Do not ever stop blood pressure medications on your own, unless instructed by healthcare professional.  Please avoid use of Fluoroquinolones as this can potentially increase your risk of Aortic Rupture and/or Dissection  Patient educated on signs and symptoms of Aortic Dissection, handout also provided in AVS  Bryan CHRISTELLA Rough, PA-C 07/07/24     [1]  Current Outpatient Medications on File Prior to Visit  Medication Sig Dispense Refill   acetaminophen  (TYLENOL ) 500 MG tablet Take 500 mg by mouth daily as needed for moderate pain.     aspirin  81 MG chewable tablet Chew 1 tablet (81 mg total) by mouth daily.     calcium  carbonate (TUMS - DOSED IN MG ELEMENTAL CALCIUM ) 500 MG chewable tablet Chew 2 tablets by mouth daily as needed for indigestion or heartburn.     Cholecalciferol (VITAMIN D3) 2000 units TABS Take  2,000 Units by mouth daily.     clopidogrel  (PLAVIX ) 75 MG tablet Take 1 tablet (75 mg total) by mouth daily. 90 tablet 2   gabapentin  (NEURONTIN ) 100 MG capsule Take 2 capsules (200 mg total) by mouth at bedtime. 180 capsule 1   losartan  (COZAAR ) 50 MG tablet TAKE ONE TABLET EVERY MORNING AND TAKE 1/2 TABLET EVERY EVENING 135 tablet 3   Menthol , Topical Analgesic, (BIOFREEZE) 4 % GEL Apply 1 application topically daily as needed (pain).     metoprolol  succinate (TOPROL -XL) 25 MG 24 hr tablet TAKE ONE TABLET BY MOUTH DAILY 90 tablet 2   Multiple Vitamins-Minerals (HAIR SKIN AND NAILS FORMULA PO) Take 1 tablet by mouth daily.     nitroGLYCERIN  (NITROSTAT ) 0.4 MG SL tablet Place 1 tablet (0.4 mg total) under the tongue every 5 (five) minutes as needed for chest pain. 25 tablet 3   Omega-3 Fatty Acids (FISH OIL ) 1200 MG CAPS Take 1 capsule (1,200 mg total) by mouth daily. 30 capsule 1   rosuvastatin  (CRESTOR ) 40 MG tablet TAKE ONE TABLET BY MOUTH DAILY 90 tablet 2   tiZANidine  (ZANAFLEX ) 4 MG tablet Take 1 tablet (4 mg total) by mouth every 8 (eight) hours as needed for muscle spasms. 90  tablet 0   traMADol  (ULTRAM ) 50 MG tablet TAKE ONE TABLET BY MOUTH EVERY 6 HOURS AS NEEDED 120 tablet 2   vitamin B-12 (CYANOCOBALAMIN ) 1000 MCG tablet Take 1,000 mcg by mouth daily.     No current facility-administered medications on file prior to visit.   "

## 2024-07-11 MED ORDER — METOPROLOL SUCCINATE ER 25 MG PO TB24: 25.0000 mg | ORAL_TABLET | Freq: Every day | ORAL | 2 refills | Status: AC

## 2024-07-12 ENCOUNTER — Other Ambulatory Visit: Payer: Self-pay

## 2024-07-12 MED ORDER — ROSUVASTATIN CALCIUM 40 MG PO TABS: 40.0000 mg | ORAL_TABLET | Freq: Every day | ORAL | 0 refills | Status: AC

## 2024-07-27 ENCOUNTER — Ambulatory Visit: Admitting: Family Medicine

## 2024-09-26 ENCOUNTER — Ambulatory Visit: Admitting: Cardiology

## 2025-05-31 ENCOUNTER — Ambulatory Visit
